# Patient Record
Sex: Female | Born: 1950 | Race: Black or African American | Hispanic: No | Marital: Single | Smoking: Former smoker
Health system: Southern US, Community
[De-identification: ages and names within clinical notes are randomized; demographics above are authoritative.]

## PROBLEM LIST (undated history)

## (undated) DIAGNOSIS — E669 Obesity, unspecified: Secondary | ICD-10-CM

## (undated) DIAGNOSIS — I2510 Atherosclerotic heart disease of native coronary artery without angina pectoris: Secondary | ICD-10-CM

## (undated) DIAGNOSIS — R9431 Abnormal electrocardiogram [ECG] [EKG]: Secondary | ICD-10-CM

## (undated) DIAGNOSIS — R233 Spontaneous ecchymoses: Secondary | ICD-10-CM

## (undated) DIAGNOSIS — D689 Coagulation defect, unspecified: Secondary | ICD-10-CM

## (undated) DIAGNOSIS — F32A Depression, unspecified: Secondary | ICD-10-CM

## (undated) DIAGNOSIS — M1990 Unspecified osteoarthritis, unspecified site: Secondary | ICD-10-CM

## (undated) DIAGNOSIS — Z8669 Personal history of other diseases of the nervous system and sense organs: Secondary | ICD-10-CM

## (undated) DIAGNOSIS — M6281 Muscle weakness (generalized): Secondary | ICD-10-CM

## (undated) DIAGNOSIS — F809 Developmental disorder of speech and language, unspecified: Secondary | ICD-10-CM

## (undated) DIAGNOSIS — R0602 Shortness of breath: Secondary | ICD-10-CM

## (undated) DIAGNOSIS — F419 Anxiety disorder, unspecified: Secondary | ICD-10-CM

## (undated) DIAGNOSIS — R238 Other skin changes: Secondary | ICD-10-CM

## (undated) DIAGNOSIS — R002 Palpitations: Secondary | ICD-10-CM

## (undated) DIAGNOSIS — H269 Unspecified cataract: Secondary | ICD-10-CM

## (undated) DIAGNOSIS — Z973 Presence of spectacles and contact lenses: Secondary | ICD-10-CM

## (undated) DIAGNOSIS — F329 Major depressive disorder, single episode, unspecified: Secondary | ICD-10-CM

## (undated) DIAGNOSIS — I209 Angina pectoris, unspecified: Secondary | ICD-10-CM

## (undated) DIAGNOSIS — C801 Malignant (primary) neoplasm, unspecified: Secondary | ICD-10-CM

## (undated) DIAGNOSIS — H9190 Unspecified hearing loss, unspecified ear: Secondary | ICD-10-CM

## (undated) DIAGNOSIS — I739 Peripheral vascular disease, unspecified: Secondary | ICD-10-CM

## (undated) DIAGNOSIS — Z9289 Personal history of other medical treatment: Secondary | ICD-10-CM

## (undated) DIAGNOSIS — K5790 Diverticulosis of intestine, part unspecified, without perforation or abscess without bleeding: Secondary | ICD-10-CM

## (undated) DIAGNOSIS — D649 Anemia, unspecified: Secondary | ICD-10-CM

## (undated) DIAGNOSIS — I639 Cerebral infarction, unspecified: Secondary | ICD-10-CM

## (undated) DIAGNOSIS — I10 Essential (primary) hypertension: Secondary | ICD-10-CM

## (undated) DIAGNOSIS — I213 ST elevation (STEMI) myocardial infarction of unspecified site: Secondary | ICD-10-CM

## (undated) DIAGNOSIS — K648 Other hemorrhoids: Secondary | ICD-10-CM

## (undated) DIAGNOSIS — K626 Ulcer of anus and rectum: Secondary | ICD-10-CM

## (undated) DIAGNOSIS — I4510 Unspecified right bundle-branch block: Secondary | ICD-10-CM

## (undated) DIAGNOSIS — N189 Chronic kidney disease, unspecified: Secondary | ICD-10-CM

## (undated) HISTORY — PX: HX CYSTOSCOPY: 2100001404

## (undated) HISTORY — PX: HX LAPAROTOMY: SHX154

## (undated) HISTORY — DX: Anemia, unspecified: D64.9

## (undated) HISTORY — DX: Major depressive disorder, single episode, unspecified: F32.9

## (undated) HISTORY — DX: Cerebral infarction, unspecified: I63.9

## (undated) HISTORY — DX: Personal history of other medical treatment: Z92.89

## (undated) HISTORY — DX: Peripheral vascular disease, unspecified: I73.9

---
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
## 2005-07-27 DIAGNOSIS — C801 Malignant (primary) neoplasm, unspecified: Secondary | ICD-10-CM

## 2005-07-27 HISTORY — DX: Malignant (primary) neoplasm, unspecified (CMS HCC): C80.1

## 2011-06-27 DIAGNOSIS — I213 ST elevation (STEMI) myocardial infarction of unspecified site: Secondary | ICD-10-CM

## 2011-06-27 HISTORY — DX: ST elevation (STEMI) myocardial infarction of unspecified site: I21.3

## 2011-07-03 ENCOUNTER — Ambulatory Visit (HOSPITAL_COMMUNITY): Admit: 2011-07-03 | Payer: Self-pay | Admitting: Interventional Cardiology

## 2011-07-03 ENCOUNTER — Other Ambulatory Visit: Payer: Self-pay

## 2011-07-03 ENCOUNTER — Encounter (HOSPITAL_COMMUNITY)
Admission: EM | Disposition: A | Discharge status: Home / Self Care | Payer: Self-pay | Source: Home / Self Care | Attending: Internal Medicine

## 2011-07-03 ENCOUNTER — Inpatient Hospital Stay (HOSPITAL_BASED_OUTPATIENT_CLINIC_OR_DEPARTMENT_OTHER)
Admission: EM | Admit: 2011-07-03 | Discharge: 2011-07-06 | DRG: 392 | Disposition: A | Discharge status: Home / Self Care | Payer: Medicare Other | Attending: Internal Medicine | Admitting: Internal Medicine

## 2011-07-03 ENCOUNTER — Other Ambulatory Visit (HOSPITAL_BASED_OUTPATIENT_CLINIC_OR_DEPARTMENT_OTHER): Payer: Medicare Other

## 2011-07-03 ENCOUNTER — Inpatient Hospital Stay (HOSPITAL_COMMUNITY): Payer: Medicare Other

## 2011-07-03 DIAGNOSIS — Z8541 Personal history of malignant neoplasm of cervix uteri: Secondary | ICD-10-CM

## 2011-07-03 DIAGNOSIS — Z87891 Personal history of nicotine dependence: Secondary | ICD-10-CM

## 2011-07-03 DIAGNOSIS — D72829 Elevated white blood cell count, unspecified: Secondary | ICD-10-CM | POA: Diagnosis present

## 2011-07-03 DIAGNOSIS — C539 Malignant neoplasm of cervix uteri, unspecified: Secondary | ICD-10-CM | POA: Diagnosis present

## 2011-07-03 DIAGNOSIS — E876 Hypokalemia: Secondary | ICD-10-CM | POA: Diagnosis present

## 2011-07-03 DIAGNOSIS — K529 Noninfective gastroenteritis and colitis, unspecified: Secondary | ICD-10-CM | POA: Diagnosis present

## 2011-07-03 DIAGNOSIS — N179 Acute kidney failure, unspecified: Secondary | ICD-10-CM | POA: Diagnosis present

## 2011-07-03 DIAGNOSIS — D649 Anemia, unspecified: Secondary | ICD-10-CM | POA: Diagnosis present

## 2011-07-03 DIAGNOSIS — I213 ST elevation (STEMI) myocardial infarction of unspecified site: Secondary | ICD-10-CM

## 2011-07-03 DIAGNOSIS — A084 Viral intestinal infection, unspecified: Secondary | ICD-10-CM | POA: Diagnosis present

## 2011-07-03 DIAGNOSIS — E860 Dehydration: Secondary | ICD-10-CM | POA: Diagnosis present

## 2011-07-03 DIAGNOSIS — H9190 Unspecified hearing loss, unspecified ear: Secondary | ICD-10-CM | POA: Diagnosis present

## 2011-07-03 DIAGNOSIS — A088 Other specified intestinal infections: Principal | ICD-10-CM | POA: Diagnosis present

## 2011-07-03 DIAGNOSIS — R748 Abnormal levels of other serum enzymes: Secondary | ICD-10-CM | POA: Diagnosis present

## 2011-07-03 DIAGNOSIS — E669 Obesity, unspecified: Secondary | ICD-10-CM | POA: Diagnosis present

## 2011-07-03 DIAGNOSIS — I4510 Unspecified right bundle-branch block: Secondary | ICD-10-CM | POA: Diagnosis present

## 2011-07-03 HISTORY — PX: HX HEART CATHETERIZATION: SHX148

## 2011-07-03 HISTORY — PX: LEFT HEART CATHETERIZATION WITH CORONARY ANGIOGRAM: SHX5451

## 2011-07-03 HISTORY — DX: ST elevation (STEMI) myocardial infarction of unspecified site: I21.3

## 2011-07-03 HISTORY — PX: CARDIAC CATHETERIZATION: SHX172

## 2011-07-03 HISTORY — DX: Malignant (primary) neoplasm, unspecified: C80.1

## 2011-07-03 LAB — COMPREHENSIVE METABOLIC PANEL
AST: 44 U/L — ABNORMAL HIGH (ref 0–37)
Albumin: 4.2 g/dL (ref 3.5–5.2)
Alkaline Phosphatase: 93 U/L (ref 39–117)
BUN: 32 mg/dL — ABNORMAL HIGH (ref 6–23)
CO2: 20 mEq/L (ref 19–32)
Chloride: 97 mEq/L (ref 96–112)
GFR calc non Af Amer: 23 mL/min — ABNORMAL LOW (ref >90)
Potassium: 4 mEq/L (ref 3.5–5.1)
Total Bilirubin: 0.7 mg/dL (ref 0.3–1.2)

## 2011-07-03 LAB — CBC
HCT: 50.7 % — ABNORMAL HIGH (ref 36.0–46.0)
Hemoglobin: 18.3 g/dL — ABNORMAL HIGH (ref 12.0–15.0)
MCH: 32.7 pg (ref 26.0–34.0)
MCHC: 36.1 g/dL — ABNORMAL HIGH (ref 30.0–36.0)
MCV: 90.7 fL (ref 78.0–100.0)
RDW: 12.8 % (ref 11.5–15.5)

## 2011-07-03 LAB — PROTIME-INR
INR: 1.15 (ref 0.00–1.49)
Prothrombin Time: 14.9 seconds (ref 11.6–15.2)

## 2011-07-03 LAB — DIFFERENTIAL
Basophils Relative: 0 % (ref 0–1)
Eosinophils Absolute: 0 10*3/uL (ref 0.0–0.7)
Lymphocytes Relative: 18 % (ref 12–46)
Lymphs Abs: 3.6 10*3/uL (ref 0.7–4.0)
Neutro Abs: 14.2 10*3/uL — ABNORMAL HIGH (ref 1.7–7.7)

## 2011-07-03 LAB — APTT: aPTT: 42 seconds — ABNORMAL HIGH (ref 24–37)

## 2011-07-03 LAB — CARDIAC PANEL(CRET KIN+CKTOT+MB+TROPI)
CK, MB: 7.1 ng/mL (ref 0.3–4.0)
Relative Index: 1 (ref 0.0–2.5)
Total CK: 733 U/L — ABNORMAL HIGH (ref 7–177)

## 2011-07-03 LAB — CK TOTAL AND CKMB (NOT AT ARMC): CK, MB: 8.8 ng/mL (ref 0.3–4.0)

## 2011-07-03 SURGERY — LEFT HEART CATHETERIZATION WITH CORONARY ANGIOGRAM
Anesthesia: LOCAL | Laterality: Bilateral

## 2011-07-03 MED ORDER — ASPIRIN 300 MG RE SUPP
300.0000 mg | RECTAL | Status: AC
  Filled 2011-07-03: qty 1

## 2011-07-03 MED ORDER — ACETAMINOPHEN 325 MG PO TABS: 650.0000 mg | ORAL_TABLET | ORAL | Status: DC | PRN

## 2011-07-03 MED ORDER — LIDOCAINE HCL (PF) 1 % IJ SOLN
INTRAMUSCULAR | Status: AC
  Filled 2011-07-03: qty 30

## 2011-07-03 MED ORDER — ONDANSETRON HCL 4 MG/2ML IJ SOLN
4.0000 mg | Freq: Four times a day (QID) | INTRAMUSCULAR | Status: DC | PRN
  Administered 2011-07-03: 4 mg via INTRAVENOUS
  Filled 2011-07-03: qty 2

## 2011-07-03 MED ORDER — NITROGLYCERIN 0.2 MG/ML ON CALL CATH LAB
INTRAVENOUS | Status: AC
  Filled 2011-07-03: qty 1

## 2011-07-03 MED ORDER — SODIUM CHLORIDE 0.9 % IV SOLN
INTRAVENOUS | Status: DC
  Administered 2011-07-03: via INTRAVENOUS
  Administered 2011-07-03: 125 mL/h via INTRAVENOUS
  Administered 2011-07-04 – 2011-07-06 (×2): via INTRAVENOUS

## 2011-07-03 MED ORDER — ASPIRIN 81 MG PO CHEW: 324.0000 mg | CHEWABLE_TABLET | ORAL | Status: AC

## 2011-07-03 MED ORDER — FENTANYL CITRATE 0.05 MG/ML IJ SOLN
INTRAMUSCULAR | Status: AC
  Filled 2011-07-03: qty 2

## 2011-07-03 MED ORDER — ASPIRIN 81 MG PO CHEW
324.0000 mg | CHEWABLE_TABLET | Freq: Once | ORAL | Status: AC
  Administered 2011-07-03: 324 mg via ORAL

## 2011-07-03 MED ORDER — HEPARIN (PORCINE) IN NACL 2-0.9 UNIT/ML-% IJ SOLN
INTRAMUSCULAR | Status: AC
  Filled 2011-07-03: qty 2000

## 2011-07-03 MED ORDER — MORPHINE SULFATE 2 MG/ML IJ SOLN: 1.0000 mg | INTRAMUSCULAR | Status: DC | PRN

## 2011-07-03 MED ORDER — ONDANSETRON HCL 4 MG/2ML IJ SOLN: 4.0000 mg | Freq: Four times a day (QID) | INTRAMUSCULAR | Status: DC | PRN

## 2011-07-03 MED ORDER — ALPRAZOLAM 0.25 MG PO TABS: 0.2500 mg | ORAL_TABLET | Freq: Two times a day (BID) | ORAL | Status: DC | PRN

## 2011-07-03 MED ORDER — NITROGLYCERIN 0.4 MG SL SUBL: 0.4000 mg | SUBLINGUAL_TABLET | SUBLINGUAL | Status: DC | PRN

## 2011-07-03 MED ORDER — MIDAZOLAM HCL 2 MG/2ML IJ SOLN
INTRAMUSCULAR | Status: AC
  Filled 2011-07-03: qty 2

## 2011-07-03 NOTE — Progress Notes (Signed)
Dr. Lurline Idol notified of Patient's troponin 4.5 and CKMB 7.1 at this time. No new orders received. Patient is chest pain free.  Blair Heys Leanne 10:50 PM 07/03/2011

## 2011-07-03 NOTE — Progress Notes (Signed)
  Echocardiogram 2D Echocardiogram has been performed.  Dorena Cookey 07/03/2011, 10:17 PM

## 2011-07-03 NOTE — ED Notes (Signed)
Pt has a 3 day history of chest pain, shortness of breath and diaphoresis.

## 2011-07-03 NOTE — H&P (Addendum)
Admit date: 07/03/2011 Referring Physician Lorre Nick Primary Cardiologist [facility] Chief complaint/reason for admission: Chest pain, concern for inferior ST elevation MI  HPI: 60 -year-old woman who has a history of cervical cancer.  Due to chemotherapy, she has severe hearing loss.  She has been having intermittent chest pain over the past 3 days.  She has been vomiting severely.  She has not been able to keep any food down.  She came to [facility] and had an ECG showing a right bundle branch block with ST elevation in the inferior and lateral leads.  It was reported that there was ST depression in V1 and V2.  She is transferred for emergent cardiac catheterization [facility].  Upon arrival to the cath lab, she is pain free.  She has left hip pain.    PMH:  Cervical cancer PSH:  None   ALLERGIES:   Review of patient's allergies indicates no known allergies.  Prior to Admit Meds:   No prescriptions prior to admission   Family HX:   No early heart disease Social HX:    History   Social History  . Marital Status: Unknown    Spouse Name: N/A    Number of Children: N/A  . Years of Education: N/A   Occupational History  . Not on file.   Social History Main Topics  . Smoking status: Former Games developer  . Smokeless tobacco: Not on file  . Alcohol Use:   . Drug Use:   . Sexually Active:    Other Topics Concern  . Not on file   Social History Narrative  . No narrative on file     ROS:  All 11 ROS were addressed and are negative except what is stated in the HPI  PHYSICAL EXAM Filed Vitals:   07/03/11 1823  BP: 112/70  Pulse: 97  Temp:   Resp: 18   General: Well developed, well nourished, in no acute distress Head: Eyes PERRLA, No xanthomas.   Normal cephalic and atramatic  Lungs:   Clear bilaterally to auscultation and percussion. Heart:   Tachycardic S1 S2 Pulses are 2+ & equal.             Abdomen: Bowel sounds are positive, abdomen soft and non-tender  without masses or                  Hernia's noted.  Extremities:   2+ right femoral pulse Neuro: Alert and oriented X 3. Psych:  Good affect, responds appropriately   Labs:   Lab Results  Component Value Date   WBC 20.0* 07/03/2011   HGB 18.3* 07/03/2011   HCT 50.7* 07/03/2011   MCV 90.7 07/03/2011   PLT 278 07/03/2011    Lab 07/03/11 1822  NA 137  K 4.0  CL 97  CO2 20  BUN 32*  CREATININE 2.20*  CALCIUM 10.4  PROT 9.1*  BILITOT 0.7  ALKPHOS 93  ALT 22  AST 44*  GLUCOSE 126*   Lab Results  Component Value Date   CKTOTAL 914* 07/03/2011   CKMB 8.8* 07/03/2011   TROPONINI 6.90* 07/03/2011   No results found for this basename: PTT   No results found for this basename: INR, PROTIME     No results found for this basename: CHOL   No results found for this basename: HDL   No results found for this basename: LDLCALC   No results found for this basename: TRIG   No results found for this basename:  CHOLHDL   No results found for this basename: LDLDIRECT      Radiology:  @[id number] @  EKG:  Sinus tachycardia, right bundle branch block, ST elevation in the inferior leads with Q waves  ASSESSMENT: Possible acute MI.  PLAN:  She'll be taken emergently to the cath lab.  Further plans will be based on her catheterization results. We'll have to minimize contrast exposure due to her increased creatinine.    She will also need an infectious disease workup due to the elevated white blood cell count.  Traci Ruud S.  07/03/2011  7:47 PM   Addendum Repeat physical exam last night after cath revealed some mild lower abdominal tenderness to palpation.  Consider abdominal CT scan when creatinine improves.  Echo shows hyperdynamic LV function.  Unclear why troponin is increased.  This is not ACS, and is not Takatsubo. Elevated Troponin may be related to renal failure.  Watch fluid status given that she has some LVH to avoid exacerbating diastolic heart  failure.  Traci Schurman S. 07/04/2011

## 2011-07-03 NOTE — Op Note (Signed)
PROCEDURE:  Left heart catheterization with selective coronary angiography  INDICATIONS:    The patient was brought emergently to the cath lab to due to concern for an inferior ST elevation MI.  The patient verbalized understanding and wanted to proceed.  Informed written consent was obtained.  PROCEDURE TECHNIQUE:  After Xylocaine anesthesia a 48F sheath was placed in the right femoral vein.  A 6 French sheath was placed in the  right femoral artery with a single anterior needle wall stick.   Left coronary angiography was done using a Judkins L4 guide catheter.  Right coronary angiography was done using a Judkins R4 guide catheter.  Left heart catheterization was done using a pigtail catheter.    CONTRAST:  Total of 20 cc.  COMPLICATIONS:  None.    HEMODYNAMICS:  Aortic pressure was 125/77, mean aortic pressure 96; LV pressure was 129/5; LVEDP 8.  There was no gradient between the left ventricle and aorta.    ANGIOGRAPHIC DATA:   The left main coronary artery is a short vessel and angiographically normal..  The left anterior descending artery is a large vessel which reaches the apex and is angiographically normal.  There are 2 medium-sized diagonals which are widely patent.  The left circumflex artery is a large vessel across the lateral wall which is angiographically normal.  There is a large OM1 which is angiographically normal..  The right coronary artery is a large dominant vessel which is angiographically normal.  IMPRESSIONS:  1. Normal left main coronary artery. 2. Normal left anterior descending artery and its branches. 3. Normal left circumflex artery and its branches. 4. Normal right coronary artery. 5. LVEDP 8 mmHg.  Ejection fraction was not assessed due to the patient's increased creatinine.   RECOMMENDATION:  The patient will be admitted for IV hydration.  She has an elevated white blood cell count.  She will need blood cultures.  This is not an acute coronary syndrome.   She may have some type of flulike illness which caused her vomiting.

## 2011-07-03 NOTE — ED Notes (Signed)
Pt transported to ED room via charge nurse.

## 2011-07-03 NOTE — ED Provider Notes (Signed)
History     CSN: [id number] Arrival date & time: 07/03/2011  6:11 PM   First MD Initiated Contact with Patient 07/03/11 1814      Chief Complaint  Patient presents with  . Chest Pain    (Consider location/radiation/quality/duration/timing/severity/associated sxs/prior treatment) Patient is a 60 y.o. female presenting with chest pain. The history is provided by the patient and a relative. The history is limited by the condition of the patient.  Chest Pain    patient here with acute onset of chest pain and pressure which started 3 days ago which became worse again. Symptoms described as being intermittent in nature and localized to her mid sternal area. She is currently chest pain free but did have chest pain prior to arrival and this was associated with diaphoresis and shortness of breath.  denies any fever or cough. Patient did not have any aspirin prior to arrival. She denies any prior cardiac history. Nothing makes her symptoms better or worse pain is described as pressure in nature. History reviewed. No pertinent past medical history.  No past surgical history on file.  No family history on file.  History  Substance Use Topics  . Smoking status: Former Games developer  . Smokeless tobacco: Not on file  . Alcohol Use:     OB History    Grav Para Term Preterm Abortions TAB SAB Ect Mult Living                  Review of Systems  Cardiovascular: Positive for chest pain.  All other systems reviewed and are negative.    Allergies  Review of patient's allergies indicates no known allergies.  Home Medications  No current outpatient prescriptions on file.  BP 112/70  Pulse 97  Temp(Src) 97.7 F (36.5 C) (Oral)  Resp 18  SpO2 98%  Physical Exam  Nursing note and vitals reviewed. Constitutional: She is oriented to person, place, and time. She appears well-developed and well-nourished.  Non-toxic appearance. No distress.  HENT:  Head: Normocephalic and atraumatic.  Eyes:  Conjunctivae, EOM and lids are normal. Pupils are equal, round, and reactive to light.  Neck: Normal range of motion. Neck supple. No tracheal deviation present. No mass present.  Cardiovascular: Regular rhythm and normal heart sounds.  Tachycardia present.  Exam reveals no gallop.   No murmur heard. Pulmonary/Chest: Effort normal and breath sounds normal. No stridor. No respiratory distress. She has no decreased breath sounds. She has no wheezes. She has no rhonchi. She has no rales.  Abdominal: Soft. Normal appearance and bowel sounds are normal. She exhibits no distension. There is no tenderness. There is no rebound and no CVA tenderness.  Musculoskeletal: Normal range of motion. She exhibits no edema and no tenderness.  Neurological: She is alert and oriented to person, place, and time. She has normal strength. No cranial nerve deficit or sensory deficit. GCS eye subscore is 4. GCS verbal subscore is 5. GCS motor subscore is 6.  Skin: Skin is warm. No abrasion and no rash noted. She is diaphoretic.  Psychiatric: Her speech is normal and behavior is normal. Her mood appears anxious.    ED Course  Procedures (including critical care time)   Labs Reviewed  CBC  DIFFERENTIAL  TROPONIN I  CK TOTAL AND CKMB  COMPREHENSIVE METABOLIC PANEL   No results found.   No diagnosis found.    MDM   Date: 07/03/2011  Rate: 120  Rhythm: sinus tachycardia  QRS Axis: normal  Intervals: normal  ST/T Wave abnormalities: ST elevations inferiorly, LATERALLY  Conduction Disutrbances:right bundle branch block  Narrative Interpretation: inferior lateral STEMI  Old EKG Reviewed: none available  Patient given aspirin on arrival. Code STEMI activated. Patient currently chest pain/pressure free. Spoke with cardiologist at [facility] and patient to be transported directly to cardiac catheterization lab. Patient had a repeat EKG 10 minutes later continued to show ST elevations inferior and  laterally        Toy Baker, MD 07/03/11 1836

## 2011-07-04 LAB — BASIC METABOLIC PANEL
BUN: 27 mg/dL — ABNORMAL HIGH (ref 6–23)
CO2: 21 mEq/L (ref 19–32)
Calcium: 8.1 mg/dL — ABNORMAL LOW (ref 8.4–10.5)
Chloride: 99 mEq/L (ref 96–112)
Creatinine, Ser: 1.37 mg/dL — ABNORMAL HIGH (ref 0.50–1.10)
GFR calc Af Amer: 48 mL/min — ABNORMAL LOW (ref >90)
GFR calc non Af Amer: 41 mL/min — ABNORMAL LOW (ref >90)
Glucose, Bld: 114 mg/dL — ABNORMAL HIGH (ref 70–99)
Sodium: 133 mEq/L — ABNORMAL LOW (ref 135–145)

## 2011-07-04 LAB — URINALYSIS, ROUTINE W REFLEX MICROSCOPIC
Glucose, UA: NEGATIVE mg/dL
Ketones, ur: NEGATIVE mg/dL
Nitrite: NEGATIVE
pH: 5 (ref 5.0–8.0)

## 2011-07-04 LAB — CARDIAC PANEL(CRET KIN+CKTOT+MB+TROPI)
CK, MB: 7.4 ng/mL (ref 0.3–4.0)
Relative Index: 0.9 (ref 0.0–2.5)
Total CK: 766 U/L — ABNORMAL HIGH (ref 7–177)

## 2011-07-04 LAB — LIPID PANEL
HDL: 48 mg/dL (ref >39)
LDL Cholesterol: 154 mg/dL — ABNORMAL HIGH (ref 0–99)
Total CHOL/HDL Ratio: 4.8 RATIO
Triglycerides: 129 mg/dL (ref <150)
VLDL: 26 mg/dL (ref 0–40)

## 2011-07-04 LAB — HEMOGLOBIN A1C: Mean Plasma Glucose: 120 mg/dL — ABNORMAL HIGH (ref <117)

## 2011-07-04 LAB — CBC
Hemoglobin: 14.6 g/dL (ref 12.0–15.0)
MCH: 33.1 pg (ref 26.0–34.0)
MCV: 94.8 fL (ref 78.0–100.0)
Platelets: 204 10*3/uL (ref 150–400)
RBC: 4.41 MIL/uL (ref 3.87–5.11)
WBC: 16.3 10*3/uL — ABNORMAL HIGH (ref 4.0–10.5)

## 2011-07-04 LAB — URINE MICROSCOPIC-ADD ON

## 2011-07-04 LAB — TSH: TSH: 1.951 u[IU]/mL (ref 0.350–4.500)

## 2011-07-04 MED ORDER — MAGNESIUM HYDROXIDE 400 MG/5ML PO SUSP
30.0000 mL | Freq: Every day | ORAL | Status: DC | PRN
  Administered 2011-07-04: 30 mL via ORAL
  Filled 2011-07-04: qty 30

## 2011-07-04 MED ORDER — POTASSIUM CHLORIDE CRYS ER 20 MEQ PO TBCR
20.0000 meq | EXTENDED_RELEASE_TABLET | Freq: Once | ORAL | Status: AC
  Administered 2011-07-04: 20 meq via ORAL
  Filled 2011-07-04: qty 1

## 2011-07-04 NOTE — Progress Notes (Addendum)
Paged md through answering service at 1715 for result of potassium at 1600.no call back. Paged md again directly at 74.md returned call for potassium.md en route,so verbal order taken. Tammy Sours

## 2011-07-04 NOTE — Plan of Care (Signed)
Problem: Phase II Progression Outcomes Goal: CV Risk Factors identified Outcome: Not Applicable Date Met:  07/04/11 Not cardiac/viral related

## 2011-07-04 NOTE — Progress Notes (Signed)
SUBJECTIVE:  Event of last pm noted.  Cardiac cath showed normal coronary arteries with nl LVF.    OBJECTIVE:   Vitals:   Filed Vitals:   07/04/11 0500 07/04/11 0600 07/04/11 0700 07/04/11 0800  BP: 130/61 112/70 114/75 116/65  Pulse: 103 93 106 99  Temp:    99 F (37.2 C)  TempSrc:    Oral  Resp:      Height:      Weight:      SpO2: 95% 94% 92% 94%   I&O's:   Intake/Output Summary (Last 24 hours) at 07/04/11 1191 Last data filed at 07/04/11 0600  Gross per 24 hour  Intake   1375 ml  Output    200 ml  Net   1175 ml   TELEMETRY: Reviewed telemetry pt in NSR     PHYSICAL EXAM General: Well developed, well nourished, in no acute distress Head: Eyes PERRLA, No xanthomas.   Normal cephalic and atramatic  Lungs:   Clear bilaterally to auscultation and percussion. Heart:   HRRR S1 S2 Pulses are 2+ & equal.            No carotid bruit. No JVD.  No abdominal bruits. No femoral bruits. Abdomen: Bowel sounds are positive, abdomen soft and non-tender without masses Extremities:   No clubbing, cyanosis or edema.  DP +1 Neuro: Alert and oriented X 3. Psych:  Good affect, responds appropriately   LABS: Basic Metabolic Panel:  Basename 07/04/11 0500 07/03/11 1822  NA 133* 137  K 3.3* 4.0  CL 99 97  CO2 21 20  GLUCOSE 114* 126*  BUN 33* 32*  CREATININE 1.73* 2.20*  CALCIUM 8.1* 10.4  MG -- --  PHOS -- --   Liver Function Tests:  [facility] 07/03/11 1822  AST 44*  ALT 22  ALKPHOS 93  BILITOT 0.7  PROT 9.1*  ALBUMIN 4.2    CBC:  Basename 07/04/11 0500 07/03/11 1822  WBC 16.3* 20.0*  NEUTROABS -- 14.2*  HGB 14.6 18.3*  HCT 41.8 50.7*  MCV 94.8 90.7  PLT 204 278   Cardiac Enzymes:  Basename 07/04/11 0500 07/04/11 0006 07/03/11 2124  CKTOTAL 683* 766* 733*  CKMB 6.3* 7.4* 7.1*  CKMBINDEX -- -- --  TROPONINI 3.93* 5.01* 4.50*   Hemoglobin A1C:  Basename 07/03/11 2124  HGBA1C 5.8*   Fasting Lipid Panel:  Basename 07/04/11 0500  CHOL 228*  HDL 48    LDLCALC 154*  TRIG 129  CHOLHDL 4.8  LDLDIRECT --   Thyroid Function Tests:  Basename 07/03/11 2124  TSH 1.951  T4TOTAL --  T3FREE --  THYROIDAB --    Coag Panel:   Lab Results  Component Value Date   INR 1.15 07/03/2011    RADIOLOGY: Dg Chest Port 1 View  07/04/2011  *RADIOLOGY REPORT*  Clinical Data: Infarction.  Elevated white blood cell count.  PORTABLE CHEST - 1 VIEW  Comparison: None.  Findings: Elevation of the left hemidiaphragm.  Cardiopericardial silhouette appears within normal limits for volumes of inspiration. Mediastinal contours normal.  Basilar atelectasis.  No pneumothorax.  No edema.  IMPRESSION: Elevation of the left hemidiaphragm and low lung volumes.  No acute cardiopulmonary disease.  Basilar atelectasis.  Original Report Authenticated By: Andreas Newport, M.D.      ASSESSMENT:  1.  Normal Coronary arteries by cath with normal LVF 2.  Increased CPK out of proportion to CPKMB bump and most likely related to underlying viral syndrome. 3.  Elevated Troponin in setting of  acute renal failure from dehydration. 4.  Hypokalemia 5.  Nausea and severe vomiting with dehydration 6.  Chest pain most likely due to #5 7. Acute renal failure secondary to dehydration improved on IVF 8.  Leukocytosis improved probably due to underlying viral syndrome  PLAN:   1.   transfer to Hospitalist service for further treatment of probable viral syndrome. 2.  Replete Potassium 3.  Continue IVF hydration  Quintella Reichert, MD  07/04/2011  9:17 AM

## 2011-07-05 LAB — BASIC METABOLIC PANEL
BUN: 18 mg/dL (ref 6–23)
Calcium: 7.8 mg/dL — ABNORMAL LOW (ref 8.4–10.5)
Creatinine, Ser: 1.18 mg/dL — ABNORMAL HIGH (ref 0.50–1.10)
GFR calc Af Amer: 57 mL/min — ABNORMAL LOW (ref >90)

## 2011-07-05 LAB — DRUGS OF ABUSE SCREEN W/O ALC, ROUTINE URINE
Amphetamine Screen, Ur: NEGATIVE
Barbiturate Quant, Ur: NEGATIVE
Cocaine Metabolites: NEGATIVE
Creatinine,U: 83.3 mg/dL
Propoxyphene: NEGATIVE

## 2011-07-05 LAB — URINE CULTURE: Culture  Setup Time: 201212080036

## 2011-07-05 LAB — CBC
MCHC: 33.5 g/dL (ref 30.0–36.0)
MCV: 97.2 fL (ref 78.0–100.0)
Platelets: 190 10*3/uL (ref 150–400)
RDW: 13.4 % (ref 11.5–15.5)
WBC: 10.7 10*3/uL — ABNORMAL HIGH (ref 4.0–10.5)

## 2011-07-05 NOTE — Plan of Care (Signed)
Problem: Phase II Progression Outcomes Goal: Other Phase II Outcomes/Goals Outcome: Completed/Met Date Met:  07/05/11 Gait steady . Pt denies dizziness,SOB,chest pain,pressure or tightness.

## 2011-07-05 NOTE — Progress Notes (Signed)
Subjective: Patient seen and examined this morning. denies any chest pain or abdominal pain . She is hard of hearing. She is worried that this symptoms may have occurred due to her poor dentition and she has been having bleeding gums while brushing for past few months. Denies substance abuse.  Objective:  Vital signs in last 24 hours:  Filed Vitals:   07/04/11 2000 07/05/11 0000 07/05/11 0400 07/05/11 0728  BP: 142/75 111/68 108/60 110/58  Pulse:    76  Temp:  98.5 F (36.9 C) 98.5 F (36.9 C) 98.9 F (37.2 C)  TempSrc:  Oral Oral Oral  Resp:    18  Height:      Weight:      SpO2:  96% 97% 96%    Intake/Output from previous day:   Intake/Output Summary (Last 24 hours) at 07/05/11 0958 Last data filed at 07/05/11 0700  Gross per 24 hour  Intake   3705 ml  Output   1650 ml  Net   2055 ml    Physical Exam:  General: elderly obese female  in no acute distress. Hard of hearing HEENT: no pallor, no icterus, moist oral mucosa, no JVD, no lymphadenopathy, poor dentition with bad odour Heart: Normal  s1 &s2  Regular rate and rhythm, without murmurs, rubs, gallops. Lungs: Clear to auscultation bilaterally. Abdomen: Soft, nontender, nondistended, positive bowel sounds. Extremities: No clubbing cyanosis or edema with positive pedal pulses. Neuro: Alert, awake, oriented x3, nonfocal.   Lab Results:  Basic Metabolic Panel:    Component Value Date/Time   NA 138 07/05/2011 0525   K 3.6 07/05/2011 0525   CL 109 07/05/2011 0525   CO2 23 07/05/2011 0525   BUN 18 07/05/2011 0525   CREATININE 1.18* 07/05/2011 0525   GLUCOSE 99 07/05/2011 0525   CALCIUM 7.8* 07/05/2011 0525   CBC:    Component Value Date/Time   WBC 10.7* 07/05/2011 0525   HGB 11.5* 07/05/2011 0525   HCT 34.3* 07/05/2011 0525   PLT 190 07/05/2011 0525   MCV 97.2 07/05/2011 0525   NEUTROABS 14.2* 07/03/2011 1822   LYMPHSABS 3.6 07/03/2011 1822   MONOABS 2.2* 07/03/2011 1822   EOSABS 0.0 07/03/2011 1822   BASOSABS 0.0  07/03/2011 1822    Recent Results (from the past 240 hour(s))  MRSA PCR SCREENING     Status: Normal   Collection Time   07/03/11  8:16 PM      Component Value Range Status Comment   MRSA by PCR NEGATIVE  NEGATIVE  Final   CULTURE, BLOOD (ROUTINE X 2)     Status: Normal (Preliminary result)   Collection Time   07/03/11  9:24 PM      Component Value Range Status Comment   Specimen Description BLOOD LEFT HAND   Final    Special Requests BOTTLES DRAWN AEROBIC ONLY 10CC   Final    Setup Time [id number]   Final    Culture     Final    Value:        BLOOD CULTURE RECEIVED NO GROWTH TO DATE CULTURE WILL BE HELD FOR 5 DAYS BEFORE ISSUING A FINAL NEGATIVE REPORT   Report Status PENDING   Incomplete   CULTURE, BLOOD (SINGLE)     Status: Normal (Preliminary result)   Collection Time   07/04/11 10:53 AM      Component Value Range Status Comment   Specimen Description BLOOD HAND LEFT   Final    Special Requests BOTTLES DRAWN AEROBIC ONLY  8.0 CC   Final    Setup Time [id number]   Final    Culture     Final    Value:        BLOOD CULTURE RECEIVED NO GROWTH TO DATE CULTURE WILL BE HELD FOR 5 DAYS BEFORE ISSUING A FINAL NEGATIVE REPORT   Report Status PENDING   Incomplete     Studies/Results: Dg Chest Port 1 View  07/04/2011  *RADIOLOGY REPORT*  Clinical Data: Infarction.  Elevated white blood cell count.  PORTABLE CHEST - 1 VIEW  Comparison: None.  Findings: Elevation of the left hemidiaphragm.  Cardiopericardial silhouette appears within normal limits for volumes of inspiration. Mediastinal contours normal.  Basilar atelectasis.  No pneumothorax.  No edema.  IMPRESSION: Elevation of the left hemidiaphragm and low lung volumes.  No acute cardiopulmonary disease.  Basilar atelectasis.  Original Report Authenticated By: Andreas Newport, M.D.    Medications: Scheduled Meds:   . aspirin  324 mg Oral NOW   Or  . aspirin  300 mg Rectal NOW  . potassium chloride  20 mEq Oral Once  . potassium  chloride  20 mEq Oral Once   Continuous Infusions:   . sodium chloride 125 mL/hr at 07/04/11 0732   PRN Meds:.acetaminophen, ALPRAZolam, magnesium hydroxide, morphine, nitroGLYCERIN, ondansetron (ZOFRAN) IV, ondansetron (ZOFRAN) IV  Assessment:  60 y/o AA obese female with hx of cervical ca s/p chemo admitted with substernal chest discomfort and abdominal pain with ST changes in inferior leads and significant troponemia ( 5.0) . She was taken for cardiac cath which surprisingly showed clean coronaries and normal LVEF. Associated findings of significant leucocytosis and mild AKI on admission.   PLAN:  Chest pain with troponemia Cardiac cath on 12/6 by [facility] cardiology ( Dr Eldridge Dace)  with clean coronaries and normal  LVEF. Cardiology consider the troponemia to be related to AKI however her creatinine was only 1.7 which clearly does not explain such high troponemia. Cath also does not show any signs of cardiomyopathy. Echo suggests LVH with ef OF 80% Will check for UTox which if positive for cocaine can explain the vasospasm She did have elevate CPK ( 900s) in proportion to CKMB Patient pain free at this time. Stable on tele  AKI creatinine on 1.7 on admission, now improving. Possibly related to dehydration associated with nausea and vomiting.  cont IV fluids    Viral gastroenteritis  symptoms of N,V and abd pain now resolved.  leucocytosis Possibly related to acute illness. Now resolved. Blood  cx so far negative. UA negative   Hypokalemia:  resolved       LOS: 2 days   Traci Mclaughlin 07/05/2011, 9:58 AM

## 2011-07-06 ENCOUNTER — Encounter (HOSPITAL_COMMUNITY): Payer: Self-pay | Admitting: Internal Medicine

## 2011-07-06 DIAGNOSIS — C539 Malignant neoplasm of cervix uteri, unspecified: Secondary | ICD-10-CM | POA: Diagnosis present

## 2011-07-06 DIAGNOSIS — K529 Noninfective gastroenteritis and colitis, unspecified: Secondary | ICD-10-CM | POA: Diagnosis present

## 2011-07-06 DIAGNOSIS — A084 Viral intestinal infection, unspecified: Secondary | ICD-10-CM | POA: Diagnosis present

## 2011-07-06 DIAGNOSIS — N179 Acute kidney failure, unspecified: Secondary | ICD-10-CM | POA: Diagnosis present

## 2011-07-06 DIAGNOSIS — R748 Abnormal levels of other serum enzymes: Secondary | ICD-10-CM | POA: Diagnosis present

## 2011-07-06 NOTE — Progress Notes (Signed)
1515 - pt d/c home with instructions. Pt verbalized understanding of need to find a PCP. Pt home with daughter, escorted self out per pt request.  Traci Mclaughlin 07/06/2011 4:16 PM

## 2011-07-06 NOTE — Discharge Summary (Signed)
Patient ID: Traci Mclaughlin MRN: [id number] DOB/AGE: 60-Feb-1952 60 y.o.  Admit date: 07/03/2011 Discharge date: 07/06/2011  Primary Care Physician:  No primary provider on file.  Discharge Diagnoses:    Present on Admission:  .Elevation of cardiac enzymes .Acute Viral gastroenteritis dehydration with hypokalemia .Acute kidney injury .Leucocytosis   Secondary discharge diagnosis  hx of cervical cancer   There are no discharge medications for this patient.   Disposition and Follow-up:  Patient follows up with her oncologist at [facility] Patient will be provided referral to arrange for outpatient PCP  Consults:  [facility]( cardiology)  Significant Diagnostic Studies:  Dg Chest Port 1 View  07/04/2011  *RADIOLOGY REPORT*  Clinical Data: Infarction.  Elevated white blood cell count.  PORTABLE CHEST - 1 VIEW  Comparison: None.  Findings: Elevation of the left hemidiaphragm.  Cardiopericardial silhouette appears within normal limits for volumes of inspiration. Mediastinal contours normal.  Basilar atelectasis.  No pneumothorax.  No edema.  IMPRESSION: Elevation of the left hemidiaphragm and low lung volumes.  No acute cardiopulmonary disease.  Basilar atelectasis.  Original Report Authenticated By: Andreas Newport, M.D.    Brief H and P: For complete details please refer to admission H and P, but in brief 60 -year-old woman who has a history of cervical cancer. Due to chemotherapy, she has severe hearing loss. She has been having intermittent chest pain over the past 3 days. She has been vomiting severely. She has not been able to keep any food down. She came to [facility] and had an ECG showing a right bundle branch block with ST elevation in the inferior and lateral leads. It was reported that there was ST depression in V1 and V2. She is transferred for emergent cardiac catheterization [facility].      Physical Exam on Discharge:  Filed Vitals:   07/05/11 1636 07/05/11  2100 07/06/11 0500 07/06/11 0553  BP: 121/78 118/74  123/77  Pulse: 75 80  78  Temp: 98 F (36.7 C) 98.5 F (36.9 C)  99.2 F (37.3 C)  TempSrc: Oral Oral  Oral  Resp: 18 19  18   Height:      Weight:   106.1 kg (233 lb 14.5 oz)   SpO2: 92% 98%  96%     Intake/Output Summary (Last 24 hours) at 07/06/11 1238 Last data filed at 07/06/11 0800  Gross per 24 hour  Intake   2020 ml  Output    250 ml  Net   1770 ml   General: elderly obese female in no acute distress. Hard of hearing  HEENT: no pallor, no icterus, moist oral mucosa, no JVD, no lymphadenopathy, poor dentition with bad odour  Heart: Normal s1 &s2 Regular rate and rhythm, without murmurs, rubs, gallops.  Lungs: Clear to auscultation bilaterally.  Abdomen: Soft, nontender, nondistended, positive bowel sounds.  Extremities: No clubbing cyanosis or edema with positive pedal pulses.  Neuro: Alert, awake, oriented x3, nonfocal.   CBC:    Component Value Date/Time   WBC 10.7* 07/05/2011 0525   HGB 11.5* 07/05/2011 0525   HCT 34.3* 07/05/2011 0525   PLT 190 07/05/2011 0525   MCV 97.2 07/05/2011 0525   NEUTROABS 14.2* 07/03/2011 1822   LYMPHSABS 3.6 07/03/2011 1822   MONOABS 2.2* 07/03/2011 1822   EOSABS 0.0 07/03/2011 1822   BASOSABS 0.0 07/03/2011 1822    Basic Metabolic Panel:    Component Value Date/Time   NA 138 07/05/2011 0525   K 3.6 07/05/2011 0525  CL 109 07/05/2011 0525   CO2 23 07/05/2011 0525   BUN 18 07/05/2011 0525   CREATININE 1.18* 07/05/2011 0525   GLUCOSE 99 07/05/2011 0525   CALCIUM 7.8* 07/05/2011 0525    Hospital Course:  Chest pain with troponemia  Patient admited to cardiology with chest pain symptms , EKG changes and positive troponins( 5.03) Cardiac cath on 12/6 by [facility] cardiology ( Dr Eldridge Dace) with clean coronaries and normal LVEF. Cardiology consider the troponemia to be related to AKI however her creatinine was only 1.7 which clearly does not explain such high troponemia. Cath also does not  show any signs of cardiomyopathy. Echo suggests LVH with EF of 80%  checked for UTox which was negative for cocaine.  She did have elevate CPK ( 900s) in proportion to CKMB  Patient pain free at this time and well hydrated. Given findings of LVH on echo patient possibly has underlying hypertension however was noted to ahe borderline BP so no meds started Stable on tele   AKI  creatinine on 1.7 on admission, now improving. Today at 1.18. Do not know what her baseline is. Possibly related to dehydration associated with nausea and vomiting.  Stable with IV fluids  Viral gastroenteritis  symptoms of N,V and abd pain now resolved.   leucocytosis  Possibly related to acute illness. Now resolved. Blood cx so far negative. UA negative   Hypokalemia:  resolved   Anemia  patient does have mild anemia and again no previus records on file. She sees her oncologist at [facility] and does not have a PCP.  An arrangement is being made to assign a PCP for her.  Patient clinically stable to be discharged home.  Time spent on Discharge: 45 minutes  Signed: Eddie North 07/06/2011, 12:38 PM

## 2011-07-06 NOTE — Progress Notes (Signed)
07/06/2011 [facility] Clos [facility] Case Management Note [phone]    CARE MANAGEMENT NOTE 07/06/2011  Patient:  [facility]   Account Number:  [id number]  Date Initiated:  07/06/2011  Documentation initiated by:  Fransico Michael  Subjective/Objective Assessment:   admitted on 07/03/11 with chest pain and elevated enzymes     Action/Plan:   Prior to admission, patient lived at home with family support   Anticipated DC Date:  07/06/2011   Anticipated DC Plan:  HOME/SELF CARE      DC Planning Services  CM consult      Choice offered to / List presented to:             Status of service:  Completed, signed off Medicare Important Message given?   (If response is "NO", the following Medicare IM given date fields will be blank) Date Medicare IM given:   Date Additional Medicare IM given:    Discharge Disposition:  HOME/SELF CARE  Per UR Regulation:  Reviewed for med. necessity/level of care/duration of stay  Comments:  07/06/11-1103-J.Lutricia Horsfall  [phone]      60yo female patient admitted on 07/03/11 with c/o chest pain and elevated enzymes. Prior to admission, patient lived at home with family support. Independent with ADLs. No PCP identified in records. In to speak with patient regarding lack of medical doctor. Instructions and information for [facility] given to patient. Voiced no questions or concerns. No further discharge needs identified. Patient is being discharged home today to selfcare.

## 2011-07-07 LAB — THC (MARIJUANA), URINE, CONFIRMATION: Marijuana, Ur-Confirmation: 45 NG/ML — ABNORMAL HIGH

## 2011-07-07 NOTE — Progress Notes (Signed)
Retro ur ins reivew 

## 2011-07-10 LAB — CULTURE, BLOOD (SINGLE)
Culture  Setup Time: 201212081817
Culture: NO GROWTH

## 2011-07-10 LAB — CULTURE, BLOOD (ROUTINE X 2): Culture: NO GROWTH

## 2013-07-27 DIAGNOSIS — I82409 Acute embolism and thrombosis of unspecified deep veins of unspecified lower extremity: Secondary | ICD-10-CM

## 2013-07-27 HISTORY — PX: HX ABOVE KNEE AMPUTATION: SHX117

## 2013-07-27 HISTORY — DX: Acute embolism and thrombosis of unspecified deep veins of unspecified lower extremity (CMS HCC): I82.409

## 2013-10-18 ENCOUNTER — Ambulatory Visit: Payer: Medicare Other | Admitting: Physician Assistant

## 2013-10-18 DIAGNOSIS — Z0289 Encounter for other administrative examinations: Secondary | ICD-10-CM

## 2014-02-17 ENCOUNTER — Emergency Department (HOSPITAL_BASED_OUTPATIENT_CLINIC_OR_DEPARTMENT_OTHER): Payer: Medicare Other

## 2014-02-17 ENCOUNTER — Encounter (HOSPITAL_BASED_OUTPATIENT_CLINIC_OR_DEPARTMENT_OTHER): Payer: Self-pay | Admitting: Emergency Medicine

## 2014-02-17 ENCOUNTER — Emergency Department (HOSPITAL_BASED_OUTPATIENT_CLINIC_OR_DEPARTMENT_OTHER)
Admission: EM | Admit: 2014-02-17 | Discharge: 2014-02-17 | Disposition: A | Discharge status: Home / Self Care | Payer: Medicare Other | Attending: Emergency Medicine | Admitting: Emergency Medicine

## 2014-02-17 DIAGNOSIS — Z8541 Personal history of malignant neoplasm of cervix uteri: Secondary | ICD-10-CM | POA: Insufficient documentation

## 2014-02-17 DIAGNOSIS — Z87828 Personal history of other (healed) physical injury and trauma: Secondary | ICD-10-CM | POA: Insufficient documentation

## 2014-02-17 DIAGNOSIS — M79609 Pain in unspecified limb: Secondary | ICD-10-CM | POA: Diagnosis present

## 2014-02-17 DIAGNOSIS — I10 Essential (primary) hypertension: Secondary | ICD-10-CM | POA: Diagnosis not present

## 2014-02-17 DIAGNOSIS — R112 Nausea with vomiting, unspecified: Secondary | ICD-10-CM | POA: Insufficient documentation

## 2014-02-17 DIAGNOSIS — Z9889 Other specified postprocedural states: Secondary | ICD-10-CM | POA: Insufficient documentation

## 2014-02-17 DIAGNOSIS — I252 Old myocardial infarction: Secondary | ICD-10-CM | POA: Insufficient documentation

## 2014-02-17 DIAGNOSIS — M79605 Pain in left leg: Secondary | ICD-10-CM

## 2014-02-17 DIAGNOSIS — Z87891 Personal history of nicotine dependence: Secondary | ICD-10-CM | POA: Diagnosis not present

## 2014-02-17 LAB — BASIC METABOLIC PANEL
Anion gap: 15 (ref 5–15)
BUN: 34 mg/dL — AB (ref 6–23)
CALCIUM: 10 mg/dL (ref 8.4–10.5)
CHLORIDE: 102 meq/L (ref 96–112)
CO2: 23 meq/L (ref 19–32)
CREATININE: 1.6 mg/dL — AB (ref 0.50–1.10)
GFR calc Af Amer: 39 mL/min — ABNORMAL LOW (ref >90)
GFR calc non Af Amer: 33 mL/min — ABNORMAL LOW (ref >90)
Glucose, Bld: 159 mg/dL — ABNORMAL HIGH (ref 70–99)
Potassium: 3.6 mEq/L — ABNORMAL LOW (ref 3.7–5.3)
Sodium: 140 mEq/L (ref 137–147)

## 2014-02-17 MED ORDER — AMLODIPINE BESYLATE 5 MG PO TABS: 5.0000 mg | ORAL_TABLET | Freq: Every day | ORAL | Status: DC

## 2014-02-17 MED ORDER — HYDROCODONE-ACETAMINOPHEN 5-325 MG PO TABS
1.0000 | ORAL_TABLET | Freq: Once | ORAL | Status: AC
  Administered 2014-02-17: 1 via ORAL
  Filled 2014-02-17: qty 1

## 2014-02-17 MED ORDER — HYDROCODONE-ACETAMINOPHEN 5-325 MG PO TABS: 1.0000 | ORAL_TABLET | ORAL | Status: DC | PRN

## 2014-02-17 NOTE — ED Notes (Signed)
Pt adds that she has not had a BM x1 week.

## 2014-02-17 NOTE — ED Provider Notes (Signed)
CSN: [id number]     Arrival date & time 02/17/14  1143 History   First MD Initiated Contact with Patient 02/17/14 1232     Chief Complaint  Patient presents with  . Leg Pain     (Consider location/radiation/quality/duration/timing/severity/associated sxs/prior Treatment) Patient is a 63 y.o. female presenting with leg pain. The history is provided by the patient. No language interpreter was used.  Leg Pain Location:  Leg, ankle and foot Time since incident:  4 days Leg location:  L leg Ankle location:  L ankle Foot location:  L foot Pain details:    Quality:  Aching Chronicity:  New Dislocation: no   Foreign body present:  No foreign bodies Associated symptoms: no fever   Associated symptoms comment:  She states that for the past 3-4 days her left leg as been hurting, aching, from the medial foot to the upper thigh. No known injury or history of leg pain. She also reports nausea with limited vomiting on day one but none since. No fever. No CP, SOB.    Past Medical History  Diagnosis Date  . Cancer     cervical; was on chemo; has been in remission for 60yrs  . STEMI (ST elevation myocardial infarction)   . Acute kidney injury 07/06/2011   Past Surgical History  Procedure Laterality Date  . Cardiac catheterization      07/03/2011   No family history on file. History  Substance Use Topics  . Smoking status: Former Smoker -- 1.00 packs/day for 1 years    Types: Cigarettes  . Smokeless tobacco: Former Systems developer    Quit date: 06/16/2011  . Alcohol Use: Yes     Comment: occassional   OB History   Grav Para Term Preterm Abortions TAB SAB Ect Mult Living                 Review of Systems  Constitutional: Negative for fever.  Respiratory: Negative for cough and shortness of breath.   Gastrointestinal: Positive for nausea and vomiting.       See HPI.  Musculoskeletal:       See HPI.  Skin: Negative.   Neurological: Negative for weakness and numbness.      Allergies   Review of patient's allergies indicates no known allergies.  Home Medications   Prior to Admission medications   Not on File   BP 222/109  Pulse 106  Temp(Src) 97.9 F (36.6 C) (Oral)  Resp 20  Ht 5\' 3"  (1.6 m)  Wt 200 lb (90.719 kg)  BMI 35.44 kg/m2  SpO2 96% Physical Exam  Constitutional: She is oriented to person, place, and time. She appears well-developed and well-nourished. She appears distressed.  Musculoskeletal:  Left leg without swelling or discoloration. Tender to palpation of medial foot, posterior calf and medial/lateral thigh.   Neurological: She is alert and oriented to person, place, and time.  Slightly decreased sensation left leg to light touch.  Skin: Skin is warm and dry.  Psychiatric: She has a normal mood and affect.    ED Course  Procedures (including critical care time) Labs Review Labs Reviewed - No data to display  Imaging Review US Venous Img Lower Unilateral Left  02/17/2014   CLINICAL DATA:  Left leg pain  EXAM: LEFT LOWER EXTREMITY VENOUS DOPPLER ULTRASOUND  TECHNIQUE: Gray-scale sonography with graded compression, as well as color Doppler and duplex ultrasound were performed to evaluate the lower extremity deep venous systems from the level of the common femoral vein  and including the common femoral, femoral, profunda femoral, popliteal and calf veins including the posterior tibial, peroneal and gastrocnemius veins when visible. The superficial great saphenous vein was also interrogated. Spectral Doppler was utilized to evaluate flow at rest and with distal augmentation maneuvers in the common femoral, femoral and popliteal veins.  COMPARISON:  None.  FINDINGS: Common Femoral Vein: No evidence of thrombus. Normal compressibility, respiratory phasicity and response to augmentation.  Saphenofemoral Junction: No evidence of thrombus. Normal compressibility and flow on color Doppler imaging.  Profunda Femoral Vein: No evidence of thrombus. Normal  compressibility and flow on color Doppler imaging.  Femoral Vein: No evidence of thrombus. Normal compressibility, respiratory phasicity and response to augmentation.  Popliteal Vein: No evidence of thrombus. Normal compressibility, respiratory phasicity and response to augmentation.  Calf Veins: No evidence of thrombus. Normal compressibility and flow on color Doppler imaging.  Superficial Great Saphenous Vein: No evidence of thrombus. Normal compressibility and flow on color Doppler imaging.  Venous Reflux:  None.  Other Findings:  None.  IMPRESSION: No evidence of deep venous thrombosis.   Electronically Signed   By: Daryll Brod M.D.   On: 02/17/2014 13:49     EKG Interpretation None      MDM   Final diagnoses:  None    1. Left leg pain 2. Asymptomatic hypertension  No symptoms of CAD (no CP, SOB). No history of hypertension per patient and family. Refer to PCP for recheck of high blood pressure today in one week. Will treat lower extremity pain symptomatically and encourage PCP follow up for recheck.     Dewaine Oats, PA-C 02/17/14 1514

## 2014-02-17 NOTE — ED Notes (Signed)
Pt sts she was vomiting 2 days ago and then yesterday noticed that her left leg was numb and then painful when trying to walk on it.

## 2014-02-17 NOTE — Discharge Instructions (Signed)
FOLLOW UP WITH YOUR DOCTOR FOR RECHECK OF HIGH BLOOD PRESSURE AND LEG PAIN THIS WEEK. RETURN HERE WITH ANY WORSENING SYMPTOMS TO NEW CONCERNS.   Hypertension Hypertension, commonly called high blood pressure, is when the force of blood pumping through your arteries is too strong. Your arteries are the blood vessels that carry blood from your heart throughout your body. A blood pressure reading consists of a higher number over a lower number, such as 110/72. The higher number (systolic) is the pressure inside your arteries when your heart pumps. The lower number (diastolic) is the pressure inside your arteries when your heart relaxes. Ideally you want your blood pressure below 120/80. Hypertension forces your heart to work harder to pump blood. Your arteries may become narrow or stiff. Having hypertension puts you at risk for heart disease, stroke, and other problems.  RISK FACTORS Some risk factors for high blood pressure are controllable. Others are not.  Risk factors you cannot control include:   Race. You may be at higher risk if you are African American.  Age. Risk increases with age.  Gender. Men are at higher risk than women before age 82 years. After age 22, women are at higher risk than men. Risk factors you can control include:  Not getting enough exercise or physical activity.  Being overweight.  Getting too much fat, sugar, calories, or salt in your diet.  Drinking too much alcohol. SIGNS AND SYMPTOMS Hypertension does not usually cause signs or symptoms. Extremely high blood pressure (hypertensive crisis) may cause headache, anxiety, shortness of breath, and nosebleed. DIAGNOSIS  To check if you have hypertension, your health care provider will measure your blood pressure while you are seated, with your arm held at the level of your heart. It should be measured at least twice using the same arm. Certain conditions can cause a difference in blood pressure between your right and  left arms. A blood pressure reading that is higher than normal on one occasion does not mean that you need treatment. If one blood pressure reading is high, ask your health care provider about having it checked again. TREATMENT  Treating high blood pressure includes making lifestyle changes and possibly taking medicine. Living a healthy lifestyle can help lower high blood pressure. You may need to change some of your habits. Lifestyle changes may include:  Following the DASH diet. This diet is high in fruits, vegetables, and whole grains. It is low in salt, red meat, and added sugars.  Getting at least 2 hours of brisk physical activity every week.  Losing weight if necessary.  Not smoking.  Limiting alcoholic beverages.  Learning ways to reduce stress. If lifestyle changes are not enough to get your blood pressure under control, your health care provider may prescribe medicine. You may need to take more than one. Work closely with your health care provider to understand the risks and benefits. HOME CARE INSTRUCTIONS  Have your blood pressure rechecked as directed by your health care provider.   Take medicines only as directed by your health care provider. Follow the directions carefully. Blood pressure medicines must be taken as prescribed. The medicine does not work as well when you skip doses. Skipping doses also puts you at risk for problems.   Do not smoke.   Monitor your blood pressure at home as directed by your health care provider. SEEK MEDICAL CARE IF:   You think you are having a reaction to medicines taken.  You have recurrent headaches or feel dizzy.  You have swelling in your ankles.  You have trouble with your vision. SEEK IMMEDIATE MEDICAL CARE IF:  You develop a severe headache or confusion.  You have unusual weakness, numbness, or feel faint.  You have severe chest or abdominal pain.  You vomit repeatedly.  You have trouble breathing. MAKE SURE  YOU:   Understand these instructions.  Will watch your condition.  Will get help right away if you are not doing well or get worse. Document Released: 07/13/2005 Document Revised: 11/27/2013 Document Reviewed: 05/05/2013 [facility] Patient Information 2015 [place], [place]. This information is not intended to replace advice given to you by your health care provider. Make sure you discuss any questions you have with your health care provider.

## 2014-02-17 NOTE — ED Provider Notes (Signed)
Medical screening examination/treatment/procedure(s) were conducted as a shared visit with non-physician practitioner(s) and myself.  I personally evaluated the patient during the encounter.   EKG Interpretation None       Orlie Dakin, MD 02/17/14 [phone]

## 2014-02-17 NOTE — ED Provider Notes (Signed)
Patient complains of leg pain today. Patient states pain is worse with walking. She vomited 2 or 3x2 days ago. No nausea today. She is not ill-appearing She has not been to physician in 3 years. Lab work from 2012 showed renal insufficiency. Today she is noted to be hypertensive. In light of prior renal insufficiency, will recheck basic metabolic to check renal function. Will start on antihypertensive. I have have explained to the patient and her daughter that she requires close followup for blood pressure. Blood pressure should be rechecked in a week  Orlie Dakin, MD 02/17/14 1537

## 2014-02-24 DIAGNOSIS — I639 Cerebral infarction, unspecified: Secondary | ICD-10-CM | POA: Insufficient documentation

## 2014-02-24 HISTORY — DX: Cerebral infarction, unspecified (CMS HCC): I63.9

## 2014-02-24 HISTORY — PX: HX OTHER: 2100001105

## 2014-02-28 ENCOUNTER — Emergency Department (INDEPENDENT_AMBULATORY_CARE_PROVIDER_SITE_OTHER): Payer: Medicare Other

## 2014-02-28 ENCOUNTER — Emergency Department (HOSPITAL_COMMUNITY)
Admission: EM | Admit: 2014-02-28 | Discharge: 2014-02-28 | Disposition: A | Discharge status: Home / Self Care | Payer: Medicare Other | Source: Home / Self Care | Attending: Family Medicine | Admitting: Family Medicine

## 2014-02-28 ENCOUNTER — Encounter (HOSPITAL_COMMUNITY): Payer: Self-pay | Admitting: Emergency Medicine

## 2014-02-28 DIAGNOSIS — IMO0002 Reserved for concepts with insufficient information to code with codable children: Secondary | ICD-10-CM

## 2014-02-28 DIAGNOSIS — S86912A Strain of unspecified muscle(s) and tendon(s) at lower leg level, left leg, initial encounter: Secondary | ICD-10-CM

## 2014-02-28 DIAGNOSIS — S8780XA Crushing injury of unspecified lower leg, initial encounter: Secondary | ICD-10-CM

## 2014-02-28 MED ORDER — DICLOFENAC SODIUM 1 % TD GEL: 4.0000 g | Freq: Four times a day (QID) | TRANSDERMAL | Status: DC

## 2014-02-28 NOTE — ED Provider Notes (Signed)
CSN: [id number]     Arrival date & time 02/28/14  1903 History   First MD Initiated Contact with Patient 02/28/14 1921     Chief Complaint  Patient presents with  . Leg Pain   (Consider location/radiation/quality/duration/timing/severity/associated sxs/prior Treatment) Patient is a 63 y.o. female presenting with leg pain. The history is provided by the patient and a relative.  Leg Pain Location:  Knee Time since incident:  2 weeks Injury: no   Knee location:  L knee Pain details:    Severity:  Moderate   Onset quality:  Gradual   Progression:  Worsening (seen [facility] with neg doppler of left leg.  ) Chronicity:  New Dislocation: no   Associated symptoms: decreased ROM, numbness and stiffness   Associated symptoms: no swelling   Risk factors: obesity     Past Medical History  Diagnosis Date  . Cancer     cervical; was on chemo; has been in remission for 24yrs  . STEMI (ST elevation myocardial infarction)   . Acute kidney injury 07/06/2011   Past Surgical History  Procedure Laterality Date  . Cardiac catheterization      07/03/2011   History reviewed. No pertinent family history. History  Substance Use Topics  . Smoking status: Former Smoker -- 1.00 packs/day for 1 years    Types: Cigarettes  . Smokeless tobacco: Former Systems developer    Quit date: 06/16/2011  . Alcohol Use: Yes     Comment: occassional   OB History   Grav Para Term Preterm Abortions TAB SAB Ect Mult Living                 Review of Systems  Constitutional: Negative.   Gastrointestinal: Negative.   Genitourinary: Negative.   Musculoskeletal: Positive for gait problem and stiffness. Negative for joint swelling and myalgias.  Skin: Negative.     Allergies  Review of patient's allergies indicates no known allergies.  Home Medications   Prior to Admission medications   Medication Sig Start Date End Date Taking? Authorizing Provider  amLODipine (NORVASC) 5 MG tablet Take 1 tablet (5 mg total) by  mouth daily. 02/17/14   Shari A Upstill, PA-C  diclofenac sodium (VOLTAREN) 1 % GEL Apply 4 g topically 4 (four) times daily. To left knee 02/28/14   Billy Fischer, MD  HYDROcodone-acetaminophen (NORCO/VICODIN) 5-325 MG per tablet Take 1-2 tablets by mouth every 4 (four) hours as needed. 02/17/14   Shari A Upstill, PA-C   BP 178/113  Pulse 102  Temp(Src) 98.5 F (36.9 C) (Oral)  SpO2 96% Physical Exam  Nursing note and vitals reviewed. Constitutional: She is oriented to person, place, and time. She appears well-developed and well-nourished.  Musculoskeletal: She exhibits tenderness.       Left knee: She exhibits deformity, abnormal alignment and MCL laxity. She exhibits normal range of motion, no swelling and no effusion. Tenderness found. Medial joint line tenderness noted.       Legs: Neurological: She is alert and oriented to person, place, and time.  Skin: Skin is warm and dry.    ED Course  Procedures (including critical care time) Labs Review Labs Reviewed - No data to display  Imaging Review Dg Knee 2 Views Left  02/28/2014   CLINICAL DATA:  LEG PAIN LEG PAIN  EXAM: LEFT KNEE - 1-2 VIEW  COMPARISON:  None.  FINDINGS: There is no evidence of fracture, dislocation, or joint effusion. There is no evidence of arthropathy or other focal bone  abnormality. Soft tissues are unremarkable.  IMPRESSION: Negative.   Electronically Signed   By: Arne Cleveland M.D.   On: 02/28/2014 19:50     MDM   1. Strain of knee and leg, left, initial encounter       Billy Fischer, MD 02/28/14 2003

## 2014-02-28 NOTE — Discharge Instructions (Signed)
Ice and medicine and brace as needed, see orthopedist if further problems.

## 2014-02-28 NOTE — ED Notes (Signed)
C/o left leg pain for two weeks now States she was seen at the [place] [facility] for the same problem States feels as if feet goes numb Hydrocodone was given in ER and was taking as tx

## 2014-03-05 ENCOUNTER — Emergency Department (HOSPITAL_COMMUNITY): Payer: Medicare Other

## 2014-03-05 ENCOUNTER — Encounter (HOSPITAL_COMMUNITY): Payer: Self-pay | Admitting: Emergency Medicine

## 2014-03-05 ENCOUNTER — Inpatient Hospital Stay (HOSPITAL_COMMUNITY)
Admission: EM | Admit: 2014-03-05 | Discharge: 2014-03-12 | DRG: 040 | Disposition: A | Discharge status: Rehabilitation Facility | Payer: Medicare Other | Attending: Internal Medicine | Admitting: Internal Medicine

## 2014-03-05 ENCOUNTER — Inpatient Hospital Stay (HOSPITAL_COMMUNITY): Payer: Medicare Other

## 2014-03-05 DIAGNOSIS — I252 Old myocardial infarction: Secondary | ICD-10-CM

## 2014-03-05 DIAGNOSIS — R4701 Aphasia: Secondary | ICD-10-CM | POA: Diagnosis present

## 2014-03-05 DIAGNOSIS — I4510 Unspecified right bundle-branch block: Secondary | ICD-10-CM | POA: Diagnosis present

## 2014-03-05 DIAGNOSIS — R471 Dysarthria and anarthria: Secondary | ICD-10-CM | POA: Diagnosis present

## 2014-03-05 DIAGNOSIS — Z791 Long term (current) use of non-steroidal anti-inflammatories (NSAID): Secondary | ICD-10-CM | POA: Diagnosis not present

## 2014-03-05 DIAGNOSIS — R748 Abnormal levels of other serum enzymes: Secondary | ICD-10-CM | POA: Diagnosis present

## 2014-03-05 DIAGNOSIS — G936 Cerebral edema: Secondary | ICD-10-CM | POA: Diagnosis present

## 2014-03-05 DIAGNOSIS — F1210 Cannabis abuse, uncomplicated: Secondary | ICD-10-CM | POA: Diagnosis present

## 2014-03-05 DIAGNOSIS — R1310 Dysphagia, unspecified: Secondary | ICD-10-CM | POA: Diagnosis present

## 2014-03-05 DIAGNOSIS — N183 Chronic kidney disease, stage 3 unspecified: Secondary | ICD-10-CM | POA: Diagnosis present

## 2014-03-05 DIAGNOSIS — R29898 Other symptoms and signs involving the musculoskeletal system: Secondary | ICD-10-CM | POA: Diagnosis present

## 2014-03-05 DIAGNOSIS — I639 Cerebral infarction, unspecified: Secondary | ICD-10-CM | POA: Diagnosis present

## 2014-03-05 DIAGNOSIS — H9190 Unspecified hearing loss, unspecified ear: Secondary | ICD-10-CM | POA: Diagnosis present

## 2014-03-05 DIAGNOSIS — R4789 Other speech disturbances: Secondary | ICD-10-CM | POA: Diagnosis present

## 2014-03-05 DIAGNOSIS — R778 Other specified abnormalities of plasma proteins: Secondary | ICD-10-CM | POA: Diagnosis present

## 2014-03-05 DIAGNOSIS — I129 Hypertensive chronic kidney disease with stage 1 through stage 4 chronic kidney disease, or unspecified chronic kidney disease: Secondary | ICD-10-CM | POA: Diagnosis present

## 2014-03-05 DIAGNOSIS — E785 Hyperlipidemia, unspecified: Secondary | ICD-10-CM | POA: Diagnosis present

## 2014-03-05 DIAGNOSIS — I214 Non-ST elevation (NSTEMI) myocardial infarction: Secondary | ICD-10-CM | POA: Diagnosis present

## 2014-03-05 DIAGNOSIS — N179 Acute kidney failure, unspecified: Secondary | ICD-10-CM

## 2014-03-05 DIAGNOSIS — G8191 Hemiplegia, unspecified affecting right dominant side: Secondary | ICD-10-CM

## 2014-03-05 DIAGNOSIS — G8190 Hemiplegia, unspecified affecting unspecified side: Secondary | ICD-10-CM | POA: Diagnosis present

## 2014-03-05 DIAGNOSIS — Z87891 Personal history of nicotine dependence: Secondary | ICD-10-CM

## 2014-03-05 DIAGNOSIS — I672 Cerebral atherosclerosis: Secondary | ICD-10-CM | POA: Diagnosis present

## 2014-03-05 DIAGNOSIS — R29810 Facial weakness: Secondary | ICD-10-CM | POA: Diagnosis present

## 2014-03-05 DIAGNOSIS — I745 Embolism and thrombosis of iliac artery: Secondary | ICD-10-CM | POA: Diagnosis present

## 2014-03-05 DIAGNOSIS — Z8541 Personal history of malignant neoplasm of cervix uteri: Secondary | ICD-10-CM | POA: Diagnosis not present

## 2014-03-05 DIAGNOSIS — Z5189 Encounter for other specified aftercare: Secondary | ICD-10-CM | POA: Diagnosis not present

## 2014-03-05 DIAGNOSIS — I998 Other disorder of circulatory system: Secondary | ICD-10-CM

## 2014-03-05 DIAGNOSIS — R7989 Other specified abnormal findings of blood chemistry: Secondary | ICD-10-CM

## 2014-03-05 DIAGNOSIS — I10 Essential (primary) hypertension: Secondary | ICD-10-CM | POA: Diagnosis present

## 2014-03-05 DIAGNOSIS — I6350 Cerebral infarction due to unspecified occlusion or stenosis of unspecified cerebral artery: Principal | ICD-10-CM | POA: Diagnosis present

## 2014-03-05 HISTORY — DX: Essential (primary) hypertension: I10

## 2014-03-05 HISTORY — DX: Cerebral infarction, unspecified: I63.9

## 2014-03-05 HISTORY — DX: Unspecified hearing loss, unspecified ear: H91.90

## 2014-03-05 LAB — COMPREHENSIVE METABOLIC PANEL
ALK PHOS: 60 U/L (ref 39–117)
ALT: 15 U/L (ref 0–35)
AST: 24 U/L (ref 0–37)
Albumin: 3.8 g/dL (ref 3.5–5.2)
Anion gap: 16 — ABNORMAL HIGH (ref 5–15)
BILIRUBIN TOTAL: 0.5 mg/dL (ref 0.3–1.2)
BUN: 14 mg/dL (ref 6–23)
CHLORIDE: 102 meq/L (ref 96–112)
CO2: 22 meq/L (ref 19–32)
Calcium: 9.2 mg/dL (ref 8.4–10.5)
Creatinine, Ser: 1.1 mg/dL (ref 0.50–1.10)
GFR calc non Af Amer: 52 mL/min — ABNORMAL LOW (ref >90)
GFR, EST AFRICAN AMERICAN: 61 mL/min — AB (ref >90)
GLUCOSE: 93 mg/dL (ref 70–99)
POTASSIUM: 4.1 meq/L (ref 3.7–5.3)
SODIUM: 140 meq/L (ref 137–147)
TOTAL PROTEIN: 7.4 g/dL (ref 6.0–8.3)

## 2014-03-05 LAB — DIFFERENTIAL
BASOS PCT: 0 % (ref 0–1)
Basophils Absolute: 0 10*3/uL (ref 0.0–0.1)
EOS PCT: 1 % (ref 0–5)
Eosinophils Absolute: 0.1 10*3/uL (ref 0.0–0.7)
LYMPHS PCT: 15 % (ref 12–46)
Lymphs Abs: 1.5 10*3/uL (ref 0.7–4.0)
MONOS PCT: 7 % (ref 3–12)
Monocytes Absolute: 0.7 10*3/uL (ref 0.1–1.0)
NEUTROS ABS: 7.5 10*3/uL (ref 1.7–7.7)
NEUTROS PCT: 77 % (ref 43–77)
Smear Review: DECREASED

## 2014-03-05 LAB — APTT: APTT: 40 s — AB (ref 24–37)

## 2014-03-05 LAB — LIPID PANEL
Cholesterol: 191 mg/dL (ref 0–200)
HDL: 48 mg/dL (ref >39)
LDL Cholesterol: 122 mg/dL — ABNORMAL HIGH (ref 0–99)
TRIGLYCERIDES: 106 mg/dL (ref <150)
Total CHOL/HDL Ratio: 4 RATIO
VLDL: 21 mg/dL (ref 0–40)

## 2014-03-05 LAB — CBC
HEMATOCRIT: 43.6 % (ref 36.0–46.0)
HEMOGLOBIN: 14.9 g/dL (ref 12.0–15.0)
MCH: 33.6 pg (ref 26.0–34.0)
MCHC: 34.2 g/dL (ref 30.0–36.0)
MCV: 98.4 fL (ref 78.0–100.0)
Platelets: DECREASED 10*3/uL (ref 150–400)
RBC: 4.43 MIL/uL (ref 3.87–5.11)
RDW: 12.8 % (ref 11.5–15.5)
WBC: 9.8 10*3/uL (ref 4.0–10.5)

## 2014-03-05 LAB — PROTIME-INR
INR: 1.07 (ref 0.00–1.49)
Prothrombin Time: 13.9 seconds (ref 11.6–15.2)

## 2014-03-05 LAB — URINALYSIS, ROUTINE W REFLEX MICROSCOPIC
Bilirubin Urine: NEGATIVE
Glucose, UA: NEGATIVE mg/dL
HGB URINE DIPSTICK: NEGATIVE
Ketones, ur: 15 mg/dL — AB
Leukocytes, UA: NEGATIVE
Nitrite: NEGATIVE
PH: 7.5 (ref 5.0–8.0)
Protein, ur: NEGATIVE mg/dL
SPECIFIC GRAVITY, URINE: 1.01 (ref 1.005–1.030)
Urobilinogen, UA: 1 mg/dL (ref 0.0–1.0)

## 2014-03-05 LAB — I-STAT TROPONIN, ED: Troponin i, poc: 0.23 ng/mL (ref 0.00–0.08)

## 2014-03-05 LAB — CBG MONITORING, ED: Glucose-Capillary: 101 mg/dL — ABNORMAL HIGH (ref 70–99)

## 2014-03-05 LAB — TROPONIN I
Troponin I: 0.37 ng/mL (ref <0.30)
Troponin I: 0.57 ng/mL (ref <0.30)

## 2014-03-05 MED ORDER — ASPIRIN 325 MG PO TABS
325.0000 mg | ORAL_TABLET | Freq: Every day | ORAL | Status: DC
  Administered 2014-03-07: 325 mg via ORAL
  Filled 2014-03-05: qty 1

## 2014-03-05 MED ORDER — SODIUM CHLORIDE 0.9 % IV SOLN: INTRAVENOUS | Status: DC

## 2014-03-05 MED ORDER — SODIUM CHLORIDE 0.9 % IV SOLN
INTRAVENOUS | Status: DC
  Administered 2014-03-05: 16:00:00 via INTRAVENOUS

## 2014-03-05 MED ORDER — ASPIRIN 300 MG RE SUPP
300.0000 mg | Freq: Every day | RECTAL | Status: DC
  Administered 2014-03-05 – 2014-03-06 (×2): 300 mg via RECTAL
  Filled 2014-03-05 (×2): qty 1

## 2014-03-05 MED ORDER — STROKE: EARLY STAGES OF RECOVERY BOOK
Freq: Once | Status: DC
  Filled 2014-03-05: qty 1

## 2014-03-05 NOTE — H&P (Signed)
Date: 03/05/2014               Patient Name:  Traci Mclaughlin MRN: [id number]  DOB: [birth year] Age / Sex: 63 y.o., female   PCP: No primary provider on file.         Medical Service: Internal Medicine Teaching Service         Attending Physician: Dr. Carlyle Basques, MD    First Contact: Dr. Genene Churn Pager: [phone]  Second Contact: Dr. Hayes Ludwig Pager: [phone]       After Hours (After 5p/  First Contact Pager: [phone]  weekends / holidays): Second Contact Pager: [phone]   Chief Complaint: right sided facial droop and slurred speech  History of Present Illness:   63 yo female with hx of MI 06/2011, HTN, cervical cancer here with right sided facial droop and slurrech speech since yesterday. She has been complaining of LLE pain for past 1 month with several ED visits. Doppler was negative. She started to act differently since yesterday and has been tearful. Her speech has become slurred and started to have drooling. She did not walk to come to the hospital but family convinced her to come today. She is heard of hearing and we had to communicate by writing on paper. She has been tearful during the interview and cannot talk other than saying small words such as "Ok" or "no". She denies chest pain or SOB.  Meds: Current Facility-Administered Medications  Medication Dose Route Frequency Provider Last Rate Last Dose  . 0.9 %  sodium chloride infusion   Intravenous STAT Francine Graven, DO        Allergies: Allergies as of 03/05/2014  . (No Known Allergies)   Past Medical History  Diagnosis Date  . Cancer     cervical; was on chemo; has been in remission for 80yrs  . STEMI (ST elevation myocardial infarction) 06/2011  . Acute kidney injury 07/06/2011  . Hypertension   . HOH (hard of hearing)    Past Surgical History  Procedure Laterality Date  . Cardiac catheterization  07/03/2011    Dr. Irish Lack   History reviewed. No pertinent family history. History   Social History  .  Marital Status: Single    Spouse Name: N/A    Number of Children: N/A  . Years of Education: N/A   Occupational History  . Not on file.   Social History Main Topics  . Smoking status: Former Smoker -- 1.00 packs/day for 1 years    Types: Cigarettes  . Smokeless tobacco: Former Systems developer    Quit date: 06/16/2011  . Alcohol Use: Yes     Comment: occassional  . Drug Use: No  . Sexual Activity: Not on file   Other Topics Concern  . Not on file   Social History Narrative  . No narrative on file    Review of Systems: ROS Could not perform because she of her asphasia and HOH.  But denies chest pain and SOB.  Physical Exam: Blood pressure 156/78, pulse 86, temperature 98.9 F (37.2 C), temperature source Oral, resp. rate 20, SpO2 98.00%. Physical Exam  Constitutional: She appears well-developed and well-nourished. She appears distressed.  Tearful.  HENT:  Head: Normocephalic and atraumatic.  Right Ear: External ear normal.  Left Ear: External ear normal.  Right facial droop noted.   Eyes: Conjunctivae and EOM are normal. Pupils are equal, round, and reactive to light.  Neck: No JVD present.  Cardiovascular: Normal rate and regular rhythm.  Exam reveals  no gallop.   No murmur heard. GI: Soft. Normal appearance. There is no tenderness.  Musculoskeletal: Normal range of motion.  Neurological: She is alert. A cranial nerve deficit and sensory deficit is present. GCS eye subscore is 4. GCS verbal subscore is 5. GCS motor subscore is 6. She displays no Babinski's sign on the right side. She displays no Babinski's sign on the left side.  Left face V3 area sensation is less than right side. Has right facial droop. Slurred speech. Patient is aphasic mostly except for "NO" and "OK". No pronator drift. Normal finger-to-nose, normal repetitive movement, normal heel to shin. 4/5 strength right upper and lower ext. 5/5 on left extremities.  Skin: She is not diaphoretic.  Psychiatric: Her  speech is slurred.  Tearful.     Lab results: Basic Metabolic Panel:  Recent Labs  03/05/14 1508  NA 140  K 4.1  CL 102  CO2 22  GLUCOSE 93  BUN 14  CREATININE 1.10  CALCIUM 9.2   Liver Function Tests:  Recent Labs  03/05/14 1508  AST 24  ALT 15  ALKPHOS 60  BILITOT 0.5  PROT 7.4  ALBUMIN 3.8   No results found for this basename: LIPASE, AMYLASE,  in the last 72 hours No results found for this basename: AMMONIA,  in the last 72 hours CBC:  Recent Labs  03/05/14 1332  WBC 9.8  NEUTROABS 7.5  HGB 14.9  HCT 43.6  MCV 98.4  PLT PLATELETS APPEAR DECREASED   Cardiac Enzymes:  Recent Labs  03/05/14 1725  TROPONINI 0.37*   BNP: No results found for this basename: PROBNP,  in the last 72 hours D-Dimer: No results found for this basename: DDIMER,  in the last 72 hours CBG:  Recent Labs  03/05/14 1426  GLUCAP 101*   Hemoglobin A1C: No results found for this basename: HGBA1C,  in the last 72 hours Fasting Lipid Panel: No results found for this basename: CHOL, HDL, LDLCALC, TRIG, CHOLHDL, LDLDIRECT,  in the last 72 hours Thyroid Function Tests: No results found for this basename: TSH, T4TOTAL, FREET4, T3FREE, THYROIDAB,  in the last 72 hours Anemia Panel: No results found for this basename: VITAMINB12, FOLATE, FERRITIN, TIBC, IRON, RETICCTPCT,  in the last 72 hours Coagulation:  Recent Labs  03/05/14 1508  LABPROT 13.9  INR 1.07   Urine Drug Screen: Drugs of Abuse     Component Value Date/Time   LABOPIA NEGATIVE 07/05/2011 0936   COCAINSCRNUR NEGATIVE 07/05/2011 0936   LABBENZ NEGATIVE 07/05/2011 0936   AMPHETMU NEGATIVE 07/05/2011 0936    Alcohol Level: No results found for this basename: ETH,  in the last 72 hours Urinalysis: No results found for this basename: COLORURINE, APPERANCEUR, LABSPEC, PHURINE, GLUCOSEU, HGBUR, BILIRUBINUR, KETONESUR, PROTEINUR, UROBILINOGEN, NITRITE, LEUKOCYTESUR,  in the last 72 hours  Imaging results:  Ct  Head (brain) Wo Contrast  03/05/2014   CLINICAL DATA:  Facial droop and dysarthria ; right-sided weakness  EXAM: CT HEAD WITHOUT CONTRAST  TECHNIQUE: Contiguous axial images were obtained from the base of the skull through the vertex without intravenous contrast.  COMPARISON:  None.  FINDINGS: The ventricles are normal in size and configuration. There is no demonstrable mass, hemorrhage, extra-axial fluid collection, or midline shift. There is decreased attenuation in the superior left temporal lobe with involvement of the left extreme capsule and insular cortex as well consistent with an acute infarct with focal cytotoxic edema in this area. This recent appearing infarct extends more superiorly to involve  a portion of the posterior left frontal lobe.  Bony calvarium appears intact. The mastoid air cells are clear. There is nasal turbinate edema bilaterally. There is also decreased attenuation in a portion of the medial left occipital lobe which appears more chronic and may represent an older infarct. Elsewhere gray-white compartments appear unremarkable.  IMPRESSION: Acute infarct involving portions of the superior left temporal lobe as well as portions of the posterior left frontal lobe. There is also involvement of the left extreme capsule and insular cortex. There is cytotoxic edema in these areas.  There is decreased attenuation in the medial left occipital lobe with sparing of the post rib medial aspect of the left occipital lobe. Suspect older infarct in this area. There is no acute hemorrhage. There is no mass or midline shift. There is diffuse nasal turbinate edema bilaterally.   Electronically Signed   By: Lowella Grip M.D.   On: 03/05/2014 14:29   Dg Chest Port 1 View  03/05/2014   CLINICAL DATA:  LEFT side stroke, weakness, slurred speech, past history MI, cervical cancer, hypertension  EXAM: PORTABLE CHEST - 1 VIEW  COMPARISON:  Portable exam 1455 hr compared to 07/03/2011  FINDINGS: Upper  normal heart size.  Normal mediastinal contours and pulmonary vascularity.  Slight rotation to the LEFT.  Lungs grossly clear.  No pleural effusion, pneumothorax or acute osseous findings.  IMPRESSION: No acute abnormalities.   Electronically Signed   By: Lavonia Dana M.D.   On: 03/05/2014 15:01    Other results: EKG: NSR, LAD, RBBB, Q wave II, III, AVF.    Assessment & Plan by Problem: Principal Problem:   Stroke Active Problems:   Elevation of cardiac enzymes   CKD (chronic kidney disease) stage 3, GFR 30-59 ml/min   Hypertension   NSTEMI (non-ST elevated myocardial infarction)   Ischemic Stroke -acute infarct of superior left temporal lobe and portions of posterior left frontal lobe + left extreme capsule and insular cortex. There is no midline shift. There is cytotoxic edema. No acute hemorrhage. - ASA 300 per rectum. - MRA and MRI brain  - doppler carotids - permissive HTN upto SBP 220/120. if higher then can treat with levatalol.  - neuro checks - PT and OT. - ECHO - hgba1c, lipid panel.  Elevated cardiac enzymes - 0.37 (lab) and 0.23 (POC) - repeat EKG. EKG showed some changes such as RBBB and Q waves II, III, AVF. - cycle trops - elevation could be 2/2 to demand ischemia in the setting of stroke and HTN.  Anion gap - without acidosis - Gap 16. Could be 2/2 to lactic acidosis given ischemia. Will check lactic acid tomorrow.   HTN -doing permissive HTN currently in the setting of stroke. - will address HTN after 24 hours. Uses norvasc at home.   CKD - stable now - not sure about baseline Crt, seems to be around 1.6. Currently 1.10 better than baseline - repeat BMP tomorrow.  Dispo: Disposition is deferred at this time, awaiting improvement of current medical problems. Anticipated discharge in approximately 2-3 day(s).   The patient does not have a current PCP (No primary provider on file.) and does need an [facility] hospital follow-up appointment after discharge.  The  patient does not know have transportation limitations that hinder transportation to clinic appointments.  Signed: Dellia Nims, MD 03/05/2014, 7:16 PM

## 2014-03-05 NOTE — H&P (Signed)
  I have seen and examined the patient myself, and I have reviewed the note by Claris Pong, MS III and was present during the interview and physical exam.  Please see my separate H&P for additional findings, assessment, and plan.   Signed: Dellia Nims, MD 03/05/2014, 8:05 PM

## 2014-03-05 NOTE — H&P (Signed)
Date: 03/05/2014               Patient Name:  Traci Mclaughlin MRN: [id number]  DOB: 1950-09-01 Age / Sex: 63 y.o., female   PCP: No primary provider on file.              Medical Service: Internal Medicine Teaching Service              Attending Physician: Dr. Carlyle Basques, MD    First Contact: Garret Reddish, MS 3 Pager: [phone]  Second Contact: Dr. Dellia Nims Pager: [phone]  Third Contact Dr. Adele Barthel Pager: [phone]       After Hours (After 5p/  First Contact Pager: [phone]  weekends / holidays): Second Contact Pager: [phone]   Chief Complaint: Stroke  History of Present Illness: Traci Mclaughlin is a 63 yo female with a history of hypertension, STEMI, and is Hard of Hearing, who presents with right sided facial droop, dysarthria, and right sided weakness for 2 days. She was in her normal state of health until two days ago when she noticed these symptoms, and presented to the ED where she was shown to have a left MCA infarction.  Meds: Current Facility-Administered Medications  Medication Dose Route Frequency Provider Last Rate Last Dose  . 0.9 %  sodium chloride infusion   Intravenous Continuous Francine Graven, DO 75 mL/hr at 03/05/14 1628    . 0.9 %  sodium chloride infusion   Intravenous STAT Francine Graven, DO        Allergies: Allergies as of 03/05/2014  . (No Known Allergies)   Past Medical History  Diagnosis Date  . Cancer     cervical; was on chemo; has been in remission for 82yrs  . STEMI (ST elevation myocardial infarction) 06/2011  . Acute kidney injury 07/06/2011  . Hypertension   . HOH (hard of hearing)    Past Surgical History  Procedure Laterality Date  . Cardiac catheterization  07/03/2011    Dr. Irish Lack   History reviewed. No pertinent family history. History   Social History  . Marital Status: Single    Spouse Name: N/A    Number of Children: N/A  . Years of Education: N/A   Occupational History  . Not on file.    Social History Main Topics  . Smoking status: Former Smoker -- 1.00 packs/day for 1 years    Types: Cigarettes  . Smokeless tobacco: Former Systems developer    Quit date: 06/16/2011  . Alcohol Use: Yes     Comment: occassional  . Drug Use: No  . Sexual Activity: Not on file   Other Topics Concern  . Not on file   Social History Narrative  . No narrative on file    Review of Systems: Limited by patient inability to talk or hear well No chest pain or abdominal pain  Vitals: Temp:  [98.3 F (36.8 C)-98.9 F (37.2 C)] 98.9 F (37.2 C) (08/10 1842) Pulse Rate:  [69-102] 86 (08/10 1842) Resp:  [13-29] 20 (08/10 1842) BP: (130-182)/(78-105) 156/78 mmHg (08/10 1842) SpO2:  [96 %-100 %] 98 % (08/10 1842)  Physical Exam: General: Tearful woman lying in bed in distress over not being able to talk  HEENT: Normocephalic, atraumatic, anicteric sclera, no conjunctivitis,  CV: RRR, nl Y6/A6, 2/6 systolic murmur, no rubs or gallops  Pulm: Normal work of breathing, no wheezes or crackles  GI: Soft, non tender, non distended, normoactive bowel sounds  MSK: Normal ROM in upper and lower extremities  bilaterally  Neuro: Alert and oriented x3, normal vision with glasses, EOMI, PERRLA,  loss of sensation in left V3 range, right facial droop that becomes more pronounced while smiling, very hard of hearing, slight corrective nystagmus in the right eye, dysarthria, 5/5 strength on head turn to the right, 4/5 strength on head turn to the left, tongue protudes without deviation and with normal motion side to side,   Normal sensation in bilateral upper and lower extremities, 5/5 right side upper and lower extremity strength, 4/5 left sided upper and lower extremity strength,   2+ biceps, brachioradialis, and left patellar reflex, could not elicit right patellar reflex  Toes downgoing bilaterally, negative palmar raise test, normal finger nose finger test   Lab results: Basic Metabolic  Panel:  Recent Labs  03/05/14 1508  NA 140  K 4.1  CL 102  CO2 22  GLUCOSE 93  BUN 14  CREATININE 1.10  CALCIUM 9.2   Liver Function Tests:  Recent Labs  03/05/14 1508  AST 24  ALT 15  ALKPHOS 60  BILITOT 0.5  PROT 7.4  ALBUMIN 3.8   CBC:  Recent Labs  03/05/14 1332  WBC 9.8  NEUTROABS 7.5  HGB 14.9  HCT 43.6  MCV 98.4  PLT PLATELETS APPEAR DECREASED   Cardiac Enzymes:  Recent Labs  03/05/14 1725  TROPONINI 0.37*   CBG:  Recent Labs  03/05/14 1426  GLUCAP 101*  Coagulation:  Recent Labs  03/05/14 1508  LABPROT 13.9  INR 1.07   Urine Drug Screen: Drugs of Abuse     Component Value Date/Time   LABOPIA NEGATIVE 07/05/2011 0936   COCAINSCRNUR NEGATIVE 07/05/2011 0936   LABBENZ NEGATIVE 07/05/2011 0936   AMPHETMU NEGATIVE 07/05/2011 0936    Imaging results:  Ct Head (brain) Wo Contrast: 03/05/2014 Acute infarct involving portions of the superior left temporal lobe as well as portions of the posterior left frontal lobe. There is also involvement of the left extreme capsule and insular cortex. There is cytotoxic edema in these areas.  There is decreased attenuation in the medial left occipital lobe with sparing of the post rib medial aspect of the left occipital lobe. Suspect older infarct in this area. There is no acute hemorrhage. There is no mass or midline shift. There is diffuse nasal turbinate edema bilaterally.   Dg Chest Port 1 View 03/05/2014   Upper normal heart size.  Normal mediastinal contours and pulmonary vascularity.  Slight rotation to the LEFT.  Lungs grossly clear.  No pleural effusion, pneumothorax or acute osseous findings.    No acute abnormalities.      Other results: EKG: Normal sinus rhythm, left axis deviation, RBBB,  inferior infarct, age undetermined, possibly new anterolateral infarct since 2012, which was otherwise unchanged  Assessment & Plan by Problem: Principal problem:   Left MCA Infarction Active problems:    NSTEMI   CKD   Hypertension  Left MCA infarction Superior left temporal lobe and posterior left frontal lobe stroke, with involvement of the left extreme capsule and insular cortex. There is no acute hemorrhage.  -- Aspirin rectal -- Lipid panel -- A1c -- Carotid duplex -- Echo -- MRI/MRA -- EKG in the AM -- PT/Speech therapy  Elevation of cardiac enzymes Troponin of 0.23 --> 0.37. Denies chest pain. EKG shows old RBBB and left axis deviation, not that different from past EKG.  -- Aspirin as above -- Cycle trops -- AM EKG -- Echo  CKD GFR 61, BUN/Cr 12.7 meaning kidney disease  not due to prerenal azotemia -- Morning BMP  Hypertension In the setting of stroke, allow her to be hypertensive and focus on treating the underlying pathology before instituting treatment.   This is a Careers information officer Note.  The care of the patient was discussed with Dr. Dellia Nims and the assessment and plan was formulated with their assistance.  Please see their note for official documentation of the patient encounter.   Signed: Tommie Sams, Med Student 03/05/2014, 6:44 PM

## 2014-03-05 NOTE — Consult Note (Signed)
Neurology Consultation Reason for Consult: Stroke Referring Physician: Towanda Malkin.  CC: Aphasia  History is obtained from: Patient, daughter  HPI: Traci Mclaughlin is a 63 y.o. female he was in her normal state of health up until yesterday when she began noticing right-sided weakness, slurred speech, difficulty speaking. Today, her daughter and asked her to come to the emergency room where was found she has a significant left MCA infarction.   LKW: 8/9 tpa given?: no, outside of window    ROS:  Unable to obtain due to altered mental status.   Past Medical History  Diagnosis Date  . Cancer     cervical; was on chemo; has been in remission for 57yrs  . STEMI (ST elevation myocardial infarction) 06/2011  . Acute kidney injury 07/06/2011  . Hypertension   . HOH (hard of hearing)     Family History: Daughter-ICH during childbirth  Social History: Tob: Denies  Exam: Current vital signs: BP 156/78  Pulse 86  Temp(Src) 98.9 F (37.2 C) (Oral)  Resp 20  SpO2 98% Vital signs in last 24 hours: Temp:  [98.3 F (36.8 C)-98.9 F (37.2 C)] 98.9 F (37.2 C) (08/10 1842) Pulse Rate:  [69-102] 86 (08/10 1842) Resp:  [13-29] 20 (08/10 1842) BP: (130-182)/(78-105) 156/78 mmHg (08/10 1842) SpO2:  [96 %-100 %] 98 % (08/10 1842)  General: In bed, NAD CV: Regular in rhythm Mental Status: Patient is awake, alert, she is unable to answer questions on orientation. She does follow commands No signs of  Neglect. She has a significant expressive aphasia. She also has some difficulty with following complex commands. Cranial Nerves: II:  Blinks to threat bilaterally. Pupils are equal, round, and reactive to light.  Discs are difficult to visualize. III,IV, VI: EOMI without ptosis or diploplia.  V: Facial sensation is decreased on right VII: Facial movement is notable for right facial droop VIII: hearing is intact to voice X: Uvula elevates symmetrically XI: Shoulder shrug is  symmetric. XII: tongue is midline without atrophy or fasciculations.  Motor: Tone is normal. Bulk is normal. 5/5 strength was present on the left, 4/5 strength in the right arm and leg Sensory: Sensation is decreased throughout the right side Deep Tendon Reflexes: 2+ and symmetric in the biceps and patellae.  Cerebellar: FNF intact bilaterally Gait: Not tested secondary to patient safety concerns    I have reviewed labs in epic and the results pertinent to this consultation are: Mildly elevated troponin CMP-unremarkable  I have reviewed the images obtained: CT head-left MCA distribution infarct  Impression: 63 year old female with left MCA distribution infarct of unclear etiology. She is being admitted for stroke workup.  Recommendations: 1. HgbA1c, fasting lipid panel 2. MRI, MRA  of the brain without contrast 3. Frequent neuro checks 4. Echocardiogram 5. Carotid dopplers 6. Prophylactic therapy-Antiplatelet med: Aspirin - dose 325mg  PO or 300mg  PR 7. Risk factor modification 8. Telemetry monitoring 9. PT consult, OT consult, Speech consult   Roland Rack, MD [facility] [phone]  If 7pm- 7am, please page neurology on call as listed in [place].

## 2014-03-05 NOTE — Consult Note (Addendum)
CONSULT NOTE  Date: 03/05/2014               Patient Name:  Traci Mclaughlin MRN: [id number]  DOB: [birth year] Age / Sex: 63 y.o., female        PCP: No primary provider on file. Primary Cardiologist: Irish Lack            Referring Physician: Baxter Flattery              Reason for Consult: Elevated Troponin           History of Present Illness: Patient is a 63 y.o. female with a PMHx of elevated cardiac enzymes in the past with normal cardiac catheterization, acute kidney injury, who was admitted to [facility] on 03/05/2014 for evaluation of  stroke.   She was noted to have an elevated point-of-care troponin level and we were called to consult.  The patient is unable to give any history. She is completely aphasic. She was able to nod and shake her head. She denies any chest pain at present. She denies any shortness of breath.  Chest CT scan of the head today which reveals an acute infarct involving the superior left temporal lobe as well as portions of the posterior left frontal lobe. Is also involvement of the left extreme capsule and insular cortex.  EKG reveals normal sinus rhythm and right bundle branch block.  In 2012, she was admitted with 3 days of CP  And elevated troponin levels up to 6.9.  She had complained of severe N / V.   She had a cardiac catheterization by Dr. Irish Lack which revealed smooth and normal coronary arteries.  Medications: Outpatient medications:  (Not in a hospital admission)  Current medications: Current Facility-Administered Medications  Medication Dose Route Frequency Provider Last Rate Last Dose  . 0.9 %  sodium chloride infusion   Intravenous Continuous Francine Graven, DO 75 mL/hr at 03/05/14 1628     Current Outpatient Prescriptions  Medication Sig Dispense Refill  . amLODipine (NORVASC) 5 MG tablet Take 5 mg by mouth daily.      . diclofenac sodium (VOLTAREN) 1 % GEL Apply 4 g topically 4 (four) times daily.      Marland Kitchen HYDROcodone-acetaminophen  (NORCO/VICODIN) 5-325 MG per tablet Take 1 tablet by mouth every 6 (six) hours as needed for moderate pain.         No Known Allergies   Past Medical History  Diagnosis Date  . Cancer     cervical; was on chemo; has been in remission for 22yrs  . STEMI (ST elevation myocardial infarction) 06/2011  . Acute kidney injury 07/06/2011  . Hypertension   . HOH (hard of hearing)     Past Surgical History  Procedure Laterality Date  . Cardiac catheterization  07/03/2011    Dr. Irish Lack    History reviewed. No pertinent family history.  Social History:  reports that she has quit smoking. Her smoking use included Cigarettes. She has a 1 pack-year smoking history. She quit smokeless tobacco use about 2 years ago. She reports that she drinks alcohol. She reports that she does not use illicit drugs.   Review of Systems: She was unable to provide any review of systems because of her severe stroke.  Physical Exam: BP 160/105  Pulse 93  Temp(Src) 98.3 F (36.8 C) (Oral)  Resp 21  SpO2 99%  Wt Readings from Last 3 Encounters:  02/17/14 200 lb (90.719 kg)  07/06/11 233 lb 14.5 oz (106.1  kg)  07/06/11 233 lb 14.5 oz (106.1 kg)    General: Vital signs reviewed and noted. Well-developed, well-nourished, in no acute distress; alert,   Head: Normocephalic, atraumatic, sclera anicteric,   Neck: Supple. Negative for carotid bruits. No JVD   Lungs:  Clear bilaterally, no  wheezes, rales, or rhonchi. Breathing is normal   Heart: RRR with S1 S2. No murmurs, rubs, or gallops   Abdomen:  Soft, non-tender, non-distended with normoactive bowel sounds. No hepatomegaly. No rebound/guarding. No obvious abdominal masses   MSK: Strength and the appear normal for age.   Extremities: No clubbing or cyanosis. No edema.  Distal pedal pulses are 2+ and equal   Neurologic: Alert.  Was able to nod and shake her head.   Psych: na    Lab results: Basic Metabolic Panel:  Recent Labs Lab 03/05/14 1508  NA  140  K 4.1  CL 102  CO2 22  GLUCOSE 93  BUN 14  CREATININE 1.10  CALCIUM 9.2    Liver Function Tests:  Recent Labs Lab 03/05/14 1508  AST 24  ALT 15  ALKPHOS 60  BILITOT 0.5  PROT 7.4  ALBUMIN 3.8   No results found for this basename: LIPASE, AMYLASE,  in the last 168 hours No results found for this basename: AMMONIA,  in the last 168 hours  CBC:  Recent Labs Lab 03/05/14 1332  WBC 9.8  NEUTROABS 7.5  HGB 14.9  HCT 43.6  MCV 98.4  PLT PLATELETS APPEAR DECREASED    Cardiac Enzymes: No results found for this basename: CKTOTAL, CKMB, CKMBINDEX, TROPONINI,  in the last 168 hours  BNP: No components found with this basename: POCBNP,   CBG:  Recent Labs Lab 03/05/14 1426  GLUCAP 101*    Coagulation Studies:  Recent Labs  03/05/14 1508  LABPROT 13.9  INR 1.07     Other results: EKG :  Normal sinus rhythm.  Right bundle branch block  Imaging: Ct Head (brain) Wo Contrast  03/05/2014   CLINICAL DATA:  Facial droop and dysarthria ; right-sided weakness  EXAM: CT HEAD WITHOUT CONTRAST  TECHNIQUE: Contiguous axial images were obtained from the base of the skull through the vertex without intravenous contrast.  COMPARISON:  None.  FINDINGS: The ventricles are normal in size and configuration. There is no demonstrable mass, hemorrhage, extra-axial fluid collection, or midline shift. There is decreased attenuation in the superior left temporal lobe with involvement of the left extreme capsule and insular cortex as well consistent with an acute infarct with focal cytotoxic edema in this area. This recent appearing infarct extends more superiorly to involve a portion of the posterior left frontal lobe.  Bony calvarium appears intact. The mastoid air cells are clear. There is nasal turbinate edema bilaterally. There is also decreased attenuation in a portion of the medial left occipital lobe which appears more chronic and may represent an older infarct. Elsewhere  gray-white compartments appear unremarkable.  IMPRESSION: Acute infarct involving portions of the superior left temporal lobe as well as portions of the posterior left frontal lobe. There is also involvement of the left extreme capsule and insular cortex. There is cytotoxic edema in these areas.  There is decreased attenuation in the medial left occipital lobe with sparing of the post rib medial aspect of the left occipital lobe. Suspect older infarct in this area. There is no acute hemorrhage. There is no mass or midline shift. There is diffuse nasal turbinate edema bilaterally.   Electronically Signed  By: Lowella Grip M.D.   On: 03/05/2014 14:29   Dg Chest Port 1 View  03/05/2014   CLINICAL DATA:  LEFT side stroke, weakness, slurred speech, past history MI, cervical cancer, hypertension  EXAM: PORTABLE CHEST - 1 VIEW  COMPARISON:  Portable exam 1455 hr compared to 07/03/2011  FINDINGS: Upper normal heart size.  Normal mediastinal contours and pulmonary vascularity.  Slight rotation to the LEFT.  Lungs grossly clear.  No pleural effusion, pneumothorax or acute osseous findings.  IMPRESSION: No acute abnormalities.   Electronically Signed   By: Lavonia Dana M.D.   On: 03/05/2014 15:01      Last Echo 07/03/11 Left ventricle: Hyperdynamic LV function creating mild LV outflow tract gradient. The cavity size was normal. Wall thickness was increased in a pattern of moderate LVH. Systolic function was normal. The estimated ejection fraction was 80%. There was dynamic obstruction, with a peak velocity of 303cm/sec and a peak gradient of 70mm Hg. Wall motion was normal; there were no regional wall motion abnormalities. - Mitral valve: Calcified annulus. Mildly thickened leaflets . Mild regurgitation. - Pericardium, extracardiac: A trivial pericardial effusion was identified      Last Cath 07/03/11 IMPRESSIONS:  1. Normal left main coronary artery. 2. Normal left anterior descending artery  and its branches. 3. Normal left circumflex artery and its branches. 4. Normal right coronary artery. 5. LVEDP 8 mmHg. Ejection fraction was not assessed due to the patient's increased creatinine.       Assessment & Plan: 1. Elevated troponin level: The patient presents with a very severe stroke and is aphasic and is unable to move. She was incidentally found to have an elevated troponin level.   We need to verify that the troponin level is actually elevated. I find that the point-of-care troponin levels are frequently elevated and the confirmatory troponin levels come back negative.  At this time we need to see how she improves from a stroke standpoint. She has normal coronary arteries as of 2012.   I do not anticipate any additional cardiac workup at this time.  She'll need to  make significant improvement from a stroke standpoint before she would be a candidate for invasive cardiac procedures.   Thayer Headings, Brooke Bonito., MD, [facility] 03/05/2014, 5:20 PM Office - [phone] Pager [phone]

## 2014-03-05 NOTE — ED Notes (Signed)
Pt continues to be monitored by 5 lead, blood pressure, and pulse ox.  

## 2014-03-05 NOTE — ED Notes (Signed)
Pt placed on monitor upon arrival to room from CT. Pt monitored by 5 lead, blood pressure, and pulse ox. Pts CBG 101 reported to nurse.

## 2014-03-05 NOTE — ED Provider Notes (Signed)
CSN: [id number]     Arrival date & time 03/05/14  1324 History   First MD Initiated Contact with Patient 03/05/14 1337     Chief Complaint  Patient presents with  . Stroke Symptoms      The history is provided by a relative, a caregiver and the patient. The history is limited by the condition of the patient (Aphasic).  Pt was seen at 1340. Per pt's family, c/o pt with gradual onset and persistence of constant right sided facial droop and slurred speech since yesterday. Pt's family also states pt has been c/o LLE "pain" for the past 1 month, with extensive ED and UCC evaluations with definitive diagnosis. Pt's family states yesterday pt "started not to act right." Family states she "was more tearful than usual," her "speech was slurred" and her "right face was drooping." Family states pt was "drooling out of the corner of her mouth." Pt's family could not convince pt to come to the ED yesterday for evaluation. When pt's symptoms continued today, family convinced pt to come to the ED for evaluation. No reported falls, no fevers, no vomiting/diarrhea, no choking, no SOB/cough.     Past Medical History  Diagnosis Date  . Cancer     cervical; was on chemo; has been in remission for 72yrs  . STEMI (ST elevation myocardial infarction) 06/2011  . Acute kidney injury 07/06/2011  . Hypertension   . HOH (hard of hearing)    Past Surgical History  Procedure Laterality Date  . Cardiac catheterization  07/03/2011    Dr. Irish Lack    History  Substance Use Topics  . Smoking status: Former Smoker -- 1.00 packs/day for 1 years    Types: Cigarettes  . Smokeless tobacco: Former Systems developer    Quit date: 06/16/2011  . Alcohol Use: Yes     Comment: occassional    Review of Systems  Unable to perform ROS: Patient nonverbal      Allergies  Review of patient's allergies indicates no known allergies.  Home Medications   Prior to Admission medications   Medication Sig Start Date End Date Taking?  Authorizing Provider  amLODipine (NORVASC) 5 MG tablet Take 5 mg by mouth daily.   Yes Historical Provider, MD  diclofenac sodium (VOLTAREN) 1 % GEL Apply 4 g topically 4 (four) times daily.   Yes Historical Provider, MD  HYDROcodone-acetaminophen (NORCO/VICODIN) 5-325 MG per tablet Take 1 tablet by mouth every 6 (six) hours as needed for moderate pain.   Yes Historical Provider, MD   BP 172/98  Pulse 90  Temp(Src) 98.3 F (36.8 C) (Oral)  Resp 17  SpO2 98% Physical Exam 1345: Physical examination:  Nursing notes reviewed; Vital signs and O2 SAT reviewed;  Constitutional: Well developed, Well nourished, In no acute distress; Head:  Normocephalic, atraumatic; Eyes: EOMI, PERRL, No scleral icterus; ENMT: Mouth and pharynx normal, Mucous membranes dry; Neck: Supple, Full range of motion, No lymphadenopathy; Cardiovascular: Regular rate and rhythm, No gallop; Respiratory: Breath sounds clear & equal bilaterally, No wheezes.  Speaking full sentences with ease, Normal respiratory effort/excursion; Chest: Nontender, Movement normal; Abdomen: Soft, Nontender, Nondistended, Normal bowel sounds; Genitourinary: No CVA tenderness; Extremities: Pulses normal, No tenderness, No edema, No calf edema or asymmetry.; Neuro: Awake, alert. +HOH per baseline. Right facial droop. Speech slurred, word searching. Right grip weaker than left grip. LUE and LLE 5/5 strength. RUE and RLE 4/5 strength. RLE with +drift. Appears not to fully comprehend cerebellar (finger-nose/heel-shin) testing..; Skin: Color normal, Warm, Dry.;  Psych:  Tearful.     ED Course  Procedures    EKG Interpretation   Date/Time:  Monday March 05 2014 13:30:20 EDT Ventricular Rate:  97 PR Interval:  116 QRS Duration: 138 QT Interval:  404 QTC Calculation: 513 R Axis:   -66 Text Interpretation:  Normal sinus rhythm Left axis deviation Right bundle  branch block Inferior infarct , age undetermined Anterolateral infarct ,  age undetermined  Baseline wander Abnormal ECG When compared with ECG of  07/05/2011 No significant change was found Confirmed by [facility]  MD,  Nunzio Cory [phone]) on 03/05/2014 1:38:47 PM      MDM  MDM Reviewed: previous chart, nursing note and vitals Reviewed previous: labs and ECG Interpretation: labs, ECG, CT scan and x-ray Total time providing critical care: 30-74 minutes. This excludes time spent performing separately reportable procedures and services. Consults: neurology, cardiology and admitting MD   CRITICAL CARE Performed by: Alfonzo Feller Total critical care time: 40 Critical care time was exclusive of separately billable procedures and treating other patients. Critical care was necessary to treat or prevent imminent or life-threatening deterioration. Critical care was time spent personally by me on the following activities: development of treatment plan with patient and/or surrogate as well as nursing, discussions with consultants, evaluation of patient's response to treatment, examination of patient, obtaining history from patient or surrogate, ordering and performing treatments and interventions, ordering and review of laboratory studies, ordering and review of radiographic studies, pulse oximetry and re-evaluation of patient's condition.   Results for orders placed during the hospital encounter of 03/05/14  PROTIME-INR      Result Value Ref Range   Prothrombin Time 13.9  11.6 - 15.2 seconds   INR 1.07  0.00 - 1.49  APTT      Result Value Ref Range   aPTT 40 (*) 24 - 37 seconds  CBC      Result Value Ref Range   WBC 9.8  4.0 - 10.5 K/uL   RBC 4.43  3.87 - 5.11 MIL/uL   Hemoglobin 14.9  12.0 - 15.0 g/dL   HCT 43.6  36.0 - 46.0 %   MCV 98.4  78.0 - 100.0 fL   MCH 33.6  26.0 - 34.0 pg   MCHC 34.2  30.0 - 36.0 g/dL   RDW 12.8  11.5 - 15.5 %   Platelets PLATELETS APPEAR DECREASED  150 - 400 K/uL  DIFFERENTIAL      Result Value Ref Range   Neutrophils Relative % 77  43 - 77 %    Lymphocytes Relative 15  12 - 46 %   Monocytes Relative 7  3 - 12 %   Eosinophils Relative 1  0 - 5 %   Basophils Relative 0  0 - 1 %   Neutro Abs 7.5  1.7 - 7.7 K/uL   Lymphs Abs 1.5  0.7 - 4.0 K/uL   Monocytes Absolute 0.7  0.1 - 1.0 K/uL   Eosinophils Absolute 0.1  0.0 - 0.7 K/uL   Basophils Absolute 0.0  0.0 - 0.1 K/uL   Smear Review       Value: PLATELET CLUMPS NOTED ON SMEAR, COUNT APPEARS DECREASED  COMPREHENSIVE METABOLIC PANEL      Result Value Ref Range   Sodium 140  137 - 147 mEq/L   Potassium 4.1  3.7 - 5.3 mEq/L   Chloride 102  96 - 112 mEq/L   CO2 22  19 - 32 mEq/L   Glucose, Bld 93  70 - 99 mg/dL   BUN 14  6 - 23 mg/dL   Creatinine, Ser 1.10  0.50 - 1.10 mg/dL   Calcium 9.2  8.4 - 10.5 mg/dL   Total Protein 7.4  6.0 - 8.3 g/dL   Albumin 3.8  3.5 - 5.2 g/dL   AST 24  0 - 37 U/L   ALT 15  0 - 35 U/L   Alkaline Phosphatase 60  39 - 117 U/L   Total Bilirubin 0.5  0.3 - 1.2 mg/dL   GFR calc non Af Amer 52 (*) >90 mL/min   GFR calc Af Amer 61 (*) >90 mL/min   Anion gap 16 (*) 5 - 15  CBG MONITORING, ED      Result Value Ref Range   Glucose-Capillary 101 (*) 70 - 99 mg/dL  I-STAT TROPOININ, ED      Result Value Ref Range   Troponin i, poc 0.23 (*) 0.00 - 0.08 ng/mL   Comment NOTIFIED PHYSICIAN     Comment 3            Ct Head (brain) Wo Contrast 03/05/2014   CLINICAL DATA:  Facial droop and dysarthria ; right-sided weakness  EXAM: CT HEAD WITHOUT CONTRAST  TECHNIQUE: Contiguous axial images were obtained from the base of the skull through the vertex without intravenous contrast.  COMPARISON:  None.  FINDINGS: The ventricles are normal in size and configuration. There is no demonstrable mass, hemorrhage, extra-axial fluid collection, or midline shift. There is decreased attenuation in the superior left temporal lobe with involvement of the left extreme capsule and insular cortex as well consistent with an acute infarct with focal cytotoxic edema in this area. This recent  appearing infarct extends more superiorly to involve a portion of the posterior left frontal lobe.  Bony calvarium appears intact. The mastoid air cells are clear. There is nasal turbinate edema bilaterally. There is also decreased attenuation in a portion of the medial left occipital lobe which appears more chronic and may represent an older infarct. Elsewhere gray-white compartments appear unremarkable.  IMPRESSION: Acute infarct involving portions of the superior left temporal lobe as well as portions of the posterior left frontal lobe. There is also involvement of the left extreme capsule and insular cortex. There is cytotoxic edema in these areas.  There is decreased attenuation in the medial left occipital lobe with sparing of the post rib medial aspect of the left occipital lobe. Suspect older infarct in this area. There is no acute hemorrhage. There is no mass or midline shift. There is diffuse nasal turbinate edema bilaterally.   Electronically Signed   By: Lowella Grip M.D.   On: 03/05/2014 14:29   US Venous Img Lower Unilateral Left 02/17/2014   CLINICAL DATA:  Left leg pain  EXAM: LEFT LOWER EXTREMITY VENOUS DOPPLER ULTRASOUND  TECHNIQUE: Gray-scale sonography with graded compression, as well as color Doppler and duplex ultrasound were performed to evaluate the lower extremity deep venous systems from the level of the common femoral vein and including the common femoral, femoral, profunda femoral, popliteal and calf veins including the posterior tibial, peroneal and gastrocnemius veins when visible. The superficial great saphenous vein was also interrogated. Spectral Doppler was utilized to evaluate flow at rest and with distal augmentation maneuvers in the common femoral, femoral and popliteal veins.  COMPARISON:  None.  FINDINGS: Common Femoral Vein: No evidence of thrombus. Normal compressibility, respiratory phasicity and response to augmentation.  Saphenofemoral Junction: No evidence of  thrombus. Normal  compressibility and flow on color Doppler imaging.  Profunda Femoral Vein: No evidence of thrombus. Normal compressibility and flow on color Doppler imaging.  Femoral Vein: No evidence of thrombus. Normal compressibility, respiratory phasicity and response to augmentation.  Popliteal Vein: No evidence of thrombus. Normal compressibility, respiratory phasicity and response to augmentation.  Calf Veins: No evidence of thrombus. Normal compressibility and flow on color Doppler imaging.  Superficial Great Saphenous Vein: No evidence of thrombus. Normal compressibility and flow on color Doppler imaging.  Venous Reflux:  None.  Other Findings:  None.  IMPRESSION: No evidence of deep venous thrombosis.   Electronically Signed   By: Daryll Brod M.D.   On: 02/17/2014 13:49   Dg Chest Port 1 View 03/05/2014   CLINICAL DATA:  LEFT side stroke, weakness, slurred speech, past history MI, cervical cancer, hypertension  EXAM: PORTABLE CHEST - 1 VIEW  COMPARISON:  Portable exam 1455 hr compared to 07/03/2011  FINDINGS: Upper normal heart size.  Normal mediastinal contours and pulmonary vascularity.  Slight rotation to the LEFT.  Lungs grossly clear.  No pleural effusion, pneumothorax or acute osseous findings.  IMPRESSION: No acute abnormalities.   Electronically Signed   By: Lavonia Dana M.D.   On: 03/05/2014 15:01    1435:  CT-H with acute CVA. Pt did not pass bedside swallow testing, will remain NPO. NIH score 10. Pt is not code stroke nor TPA candidate due to delayed presentation. Dx and testing d/w pt and family.  Questions answered.  Verb understanding, agreeable to admit. T/C to Neuro Dr. Leonel Ramsay, case discussed, including:  HPI, pertinent PM/SHx, VS/PE, dx testing, ED course and treatment:  Agreeable to consult, requests to admit to medical service.    1615:  Troponin elevated. Will hold heparin at this time due to acute CVA. T/C to Cardiology, case discussed, including:  HPI, pertinent PM/SHx,  VS/PE, dx testing, ED course and treatment:  Agreeable to consult.   1645: T/C to [facility] Resident, case discussed, including:  HPI, pertinent PM/SHx, VS/PE, dx testing, ED course and treatment:  Agreeable to admit, requests to write temporary orders, obtain tele bed to Dr. Storm Frisk service.     Francine Graven, DO 03/08/14 [phone]

## 2014-03-05 NOTE — ED Notes (Signed)
Pt placed into gown and on monitor upon arrival to room. Pt monitored by 5 lead, blood pressure, and pulse ox.  

## 2014-03-05 NOTE — Progress Notes (Signed)
Unable to complete pt's admission at this time; pt is unable to speak due to cva. Pt is very emotional about cva will continue to provide emotional support.

## 2014-03-05 NOTE — ED Notes (Signed)
Pt from home c/o difficulty speaking, facial droop and right sided weakness x 2 days; pt sts issues with htn recently; pt tearful and difficult to assess

## 2014-03-06 DIAGNOSIS — I214 Non-ST elevation (NSTEMI) myocardial infarction: Secondary | ICD-10-CM

## 2014-03-06 DIAGNOSIS — I129 Hypertensive chronic kidney disease with stage 1 through stage 4 chronic kidney disease, or unspecified chronic kidney disease: Secondary | ICD-10-CM

## 2014-03-06 DIAGNOSIS — I10 Essential (primary) hypertension: Secondary | ICD-10-CM

## 2014-03-06 DIAGNOSIS — N183 Chronic kidney disease, stage 3 unspecified: Secondary | ICD-10-CM

## 2014-03-06 DIAGNOSIS — I517 Cardiomegaly: Secondary | ICD-10-CM

## 2014-03-06 LAB — TROPONIN I: Troponin I: 0.43 ng/mL (ref <0.30)

## 2014-03-06 LAB — HIV ANTIBODY (ROUTINE TESTING W REFLEX): HIV 1&2 Ab, 4th Generation: NONREACTIVE

## 2014-03-06 LAB — RAPID URINE DRUG SCREEN, HOSP PERFORMED
Amphetamines: NOT DETECTED
BARBITURATES: NOT DETECTED
BENZODIAZEPINES: NOT DETECTED
COCAINE: NOT DETECTED
Opiates: NOT DETECTED
TETRAHYDROCANNABINOL: POSITIVE — AB

## 2014-03-06 LAB — HEMOGLOBIN A1C
Hgb A1c MFr Bld: 5.8 % — ABNORMAL HIGH (ref <5.7)
MEAN PLASMA GLUCOSE: 120 mg/dL — AB (ref <117)

## 2014-03-06 MED ORDER — ATORVASTATIN CALCIUM 40 MG PO TABS
40.0000 mg | ORAL_TABLET | Freq: Every day | ORAL | Status: DC
  Administered 2014-03-06 – 2014-03-11 (×6): 40 mg via ORAL
  Filled 2014-03-06 (×7): qty 1

## 2014-03-06 NOTE — Progress Notes (Signed)
Paged on call about BP 130/107. No new orders at this time. Will continue to monitor.

## 2014-03-06 NOTE — Progress Notes (Signed)
Echocardiogram 2D Echocardiogram has been performed.  Traci Mclaughlin 03/06/2014, 12:00 PM

## 2014-03-06 NOTE — Progress Notes (Signed)
Subjective:  Patient is more calm today. Not as tearful. Able to understand speech better today. Still remains aphasic.   Objective: Vital signs in last 24 hours: Filed Vitals:   03/06/14 0300 03/06/14 0600 03/06/14 0927 03/06/14 1307  BP: 141/77 149/92 138/97 142/82  Pulse: 100 91 89 85  Temp: 98.6 F (37 C) 98.1 F (36.7 C) 98.8 F (37.1 C) 98.8 F (37.1 C)  TempSrc: Oral Oral Oral Oral  Resp: 20 20 20 20   SpO2: 95% 98% 96% 93%   Weight change:  No intake or output data in the 24 hours ending 03/06/14 1603  Vitals reviewed. General:lying in bed, appears to have sad mood. Is able to understand our speech better today. HEENT: PERRL, EOMI, no scleral icterus. Is not able to puff cheek. Has some right facial droop present. Cannot turn head to the right.  Cardiac: RRR, no rubs, murmurs or gallops Pulm: clear to auscultation bilaterally, no wheezes, rales, or rhonchi Abd: soft, nontender, nondistended, BS present Ext: warm and well perfused, no pedal edema Neuro: alert to self, couldn't assess full orientation. Has right facial droop.Slurred speech. Patient is aphasic mostly except for "NO" and "OK". No pronator drift. 4/5 strength right upper and lower ext. 5/5 on left extremities. Sensation intact.right sided XII nerve deficit. Facial droop on right side.   Lab Results: Basic Metabolic Panel:  Recent Labs Lab 03/05/14 1508  NA 140  K 4.1  CL 102  CO2 22  GLUCOSE 93  BUN 14  CREATININE 1.10  CALCIUM 9.2   Liver Function Tests:  Recent Labs Lab 03/05/14 1508  AST 24  ALT 15  ALKPHOS 60  BILITOT 0.5  PROT 7.4  ALBUMIN 3.8   CBC:  Recent Labs Lab 03/05/14 1332  WBC 9.8  NEUTROABS 7.5  HGB 14.9  HCT 43.6  MCV 98.4  PLT PLATELETS APPEAR DECREASED   Cardiac Enzymes:  Recent Labs Lab 03/05/14 1725 03/05/14 2245 03/06/14 0510  TROPONINI 0.37* 0.57* 0.43*   BNP: No results found for this basename: PROBNP,  in the last 168 hours D-Dimer: No  results found for this basename: DDIMER,  in the last 168 hours CBG:  Recent Labs Lab 03/05/14 1426  GLUCAP 101*   Hemoglobin A1C:  Recent Labs Lab 03/05/14 2245  HGBA1C 5.8*   Fasting Lipid Panel:  Recent Labs Lab 03/05/14 2240  CHOL 191  HDL 48  LDLCALC 122*  TRIG 106  CHOLHDL 4.0   Urine Drug Screen: Drugs of Abuse     Component Value Date/Time   LABOPIA NONE DETECTED 03/05/2014 2237   LABOPIA NEGATIVE 07/05/2011 0936   COCAINSCRNUR NONE DETECTED 03/05/2014 2237   COCAINSCRNUR NEGATIVE 07/05/2011 0936   LABBENZ NONE DETECTED 03/05/2014 2237   LABBENZ NEGATIVE 07/05/2011 0936   AMPHETMU NONE DETECTED 03/05/2014 2237   AMPHETMU NEGATIVE 07/05/2011 0936   THCU POSITIVE* 03/05/2014 2237   LABBARB NONE DETECTED 03/05/2014 2237    Alcohol Level: No results found for this basename: ETH,  in the last 168 hours Urinalysis:  Recent Labs Lab 03/05/14 2237  COLORURINE YELLOW  LABSPEC 1.010  PHURINE 7.5  GLUCOSEU NEGATIVE  HGBUR NEGATIVE  BILIRUBINUR NEGATIVE  KETONESUR 15*  PROTEINUR NEGATIVE  UROBILINOGEN 1.0  NITRITE NEGATIVE  LEUKOCYTESUR NEGATIVE  Studies/Results: Ct Head (brain) Wo Contrast  03/05/2014   CLINICAL DATA:  Facial droop and dysarthria ; right-sided weakness  EXAM: CT HEAD WITHOUT CONTRAST  TECHNIQUE: Contiguous axial images were obtained from the base of the skull  through the vertex without intravenous contrast.  COMPARISON:  None.  FINDINGS: The ventricles are normal in size and configuration. There is no demonstrable mass, hemorrhage, extra-axial fluid collection, or midline shift. There is decreased attenuation in the superior left temporal lobe with involvement of the left extreme capsule and insular cortex as well consistent with an acute infarct with focal cytotoxic edema in this area. This recent appearing infarct extends more superiorly to involve a portion of the posterior left frontal lobe.  Bony calvarium appears intact. The mastoid air cells are  clear. There is nasal turbinate edema bilaterally. There is also decreased attenuation in a portion of the medial left occipital lobe which appears more chronic and may represent an older infarct. Elsewhere gray-white compartments appear unremarkable.  IMPRESSION: Acute infarct involving portions of the superior left temporal lobe as well as portions of the posterior left frontal lobe. There is also involvement of the left extreme capsule and insular cortex. There is cytotoxic edema in these areas.  There is decreased attenuation in the medial left occipital lobe with sparing of the post rib medial aspect of the left occipital lobe. Suspect older infarct in this area. There is no acute hemorrhage. There is no mass or midline shift. There is diffuse nasal turbinate edema bilaterally.   Electronically Signed   By: Lowella Grip M.D.   On: 03/05/2014 14:29   Mr Brain Wo Contrast  03/06/2014   CLINICAL DATA:  Right-sided weakness, speech difficulty. Acute left middle cerebral artery territory infarct.  EXAM: MRI HEAD WITHOUT CONTRAST  MRA HEAD WITHOUT CONTRAST  TECHNIQUE: Multiplanar, multiecho pulse sequences of the brain and surrounding structures were obtained without intravenous contrast. Angiographic images of the head were obtained using MRA technique without contrast.  COMPARISON:  CT of the head March 05, 2014 at 1408 hr  FINDINGS: MRI HEAD FINDINGS  Reduced diffusion within left frontal lobe with corresponding low ADC values. Mild expansile T2 hyperintense signal within the corresponding cortex. Local mass effect without midline shift. No susceptibility artifact to suggest hemorrhagic conversion.  Left mesial occipital lobe transcortical encephalomalacia with mild ex vacuo dilatation of the left occipital horn. The ventricles and sulci are otherwise normal for patient's age. A few scattered sub cm supratentorial white matter T2 hyperintensities, exclusive of the aforementioned acute does infarct  suggests chronic small vessel ischemic disease.  No abnormal extra-axial fluid collections. No abnormal sellar expansion. No cerebellar tonsillar ectopia. Trace paranasal sinus mucosal thickening with small right maxillary mucous retention cyst, the mastoid air cells appear well-aerated. Ocular globes and orbital contents are unremarkable. No suspicious calvarial bone marrow signal.  MRA HEAD FINDINGS  Anterior circulation: Normal flow related and enhancement of the included cervical, petrous, cavernous and supra clinoid internal carotid arteries. Normal flow related enhancement of the left M1 segment and left middle cerebral artery bifurcation. Meniscus and occlusion of left superior M2 branch, axial 42/126. Normal flow related enhancement of the anterior cerebral arteries and right middle cerebral artery.  Posterior circulation: Left vertebral artery is dominant. Normal flow related enhancement of vertebral basilar system, patent main branch vessels. Small right posterior communicating artery present. Thready left P2 segment with absent left P3 flow related enhancement.  No aneurysm.  IMPRESSION: MRI head: Acute large left middle cerebral artery territory infarct.  Remote left posterior cerebral artery territory infarct.  MRA head: Occluded left M2 superior branch consistent with acute thromboembolic disease.  Occluded versus less likely slow flow left P3 segment, thready left P2 segment.  Electronically Signed   By: Elon Alas   On: 03/06/2014 01:00   Dg Chest Port 1 View  03/05/2014   CLINICAL DATA:  LEFT side stroke, weakness, slurred speech, past history MI, cervical cancer, hypertension  EXAM: PORTABLE CHEST - 1 VIEW  COMPARISON:  Portable exam 1455 hr compared to 07/03/2011  FINDINGS: Upper normal heart size.  Normal mediastinal contours and pulmonary vascularity.  Slight rotation to the LEFT.  Lungs grossly clear.  No pleural effusion, pneumothorax or acute osseous findings.  IMPRESSION: No acute  abnormalities.   Electronically Signed   By: Lavonia Dana M.D.   On: 03/05/2014 15:01   Mr Jodene Nam Head/brain Wo Cm  03/06/2014   CLINICAL DATA:  Right-sided weakness, speech difficulty. Acute left middle cerebral artery territory infarct.  EXAM: MRI HEAD WITHOUT CONTRAST  MRA HEAD WITHOUT CONTRAST  TECHNIQUE: Multiplanar, multiecho pulse sequences of the brain and surrounding structures were obtained without intravenous contrast. Angiographic images of the head were obtained using MRA technique without contrast.  COMPARISON:  CT of the head March 05, 2014 at 1408 hr  FINDINGS: MRI HEAD FINDINGS  Reduced diffusion within left frontal lobe with corresponding low ADC values. Mild expansile T2 hyperintense signal within the corresponding cortex. Local mass effect without midline shift. No susceptibility artifact to suggest hemorrhagic conversion.  Left mesial occipital lobe transcortical encephalomalacia with mild ex vacuo dilatation of the left occipital horn. The ventricles and sulci are otherwise normal for patient's age. A few scattered sub cm supratentorial white matter T2 hyperintensities, exclusive of the aforementioned acute does infarct suggests chronic small vessel ischemic disease.  No abnormal extra-axial fluid collections. No abnormal sellar expansion. No cerebellar tonsillar ectopia. Trace paranasal sinus mucosal thickening with small right maxillary mucous retention cyst, the mastoid air cells appear well-aerated. Ocular globes and orbital contents are unremarkable. No suspicious calvarial bone marrow signal.  MRA HEAD FINDINGS  Anterior circulation: Normal flow related and enhancement of the included cervical, petrous, cavernous and supra clinoid internal carotid arteries. Normal flow related enhancement of the left M1 segment and left middle cerebral artery bifurcation. Meniscus and occlusion of left superior M2 branch, axial 42/126. Normal flow related enhancement of the anterior cerebral arteries and  right middle cerebral artery.  Posterior circulation: Left vertebral artery is dominant. Normal flow related enhancement of vertebral basilar system, patent main branch vessels. Small right posterior communicating artery present. Thready left P2 segment with absent left P3 flow related enhancement.  No aneurysm.  IMPRESSION: MRI head: Acute large left middle cerebral artery territory infarct.  Remote left posterior cerebral artery territory infarct.  MRA head: Occluded left M2 superior branch consistent with acute thromboembolic disease.  Occluded versus less likely slow flow left P3 segment, thready left P2 segment.   Electronically Signed   By: Elon Alas   On: 03/06/2014 01:00   Medications: I have reviewed the patient's current medications. Scheduled Meds: .  stroke: mapping our early stages of recovery book   Does not apply Once  . aspirin  300 mg Rectal Daily   Or  . aspirin  325 mg Oral Daily  . atorvastatin  40 mg Oral q1800   Continuous Infusions:  PRN Meds:. Assessment/Plan: Principal Problem:   Stroke Active Problems:   Elevation of cardiac enzymes   CKD (chronic kidney disease) stage 3, GFR 30-59 ml/min   Hypertension   NSTEMI (non-ST elevated myocardial infarction)   CVA (cerebral infarction)   Ischemic Stroke  -acute infarct of superior left  temporal lobe and portions of posterior left frontal lobe + left extreme capsule and insular cortex. There is no midline shift. There is cytotoxic edema. No acute hemorrhage.  - ASA 300 PO. F/up neuro recs. - MRA and MRI brain showed Acute large left middle cerebral artery territory infarct. Remote left posterior cerebral artery territory infarct. - doppler carotids showed no severe stenosis. Bilateral 1-39% ICA stenosis. - permissive HTN upto SBP 220/120. if higher then can treat with levatalol.  - neuro checks - she has high risk of recurrent strokes. - PT and OT: recommended SNF/inpatient rehab. Speech recommended dysphagia 2  diet.  - Cont to work with PT and speech therapy. - ECHO pending. F/up card recs. - hgba1c. Lipid panel showed LDL 122.started lipitor 40mg  daily (LDL goal <70).   Elevated cardiac enzymes -0.57>0.43.  - cycled trops. Elevated by trending down.  Eevation could be 2/2 to demand ischemia in the setting of stroke and HTN.  - unable to do anything for NSTEMI in the setting of stroke.  HTN  -doing permissive HTN currently in the setting of stroke.  - will address HTN after 24 hours. Uses norvasc at home.  CKD - stable now  - not sure about baseline Crt, seems to be around 1.6. Currently 1.10 better than baseline  - repeat BMP tomorrow.  Diet: dysphagia 2 Code: FULL   Dispo: Disposition is deferred at this time, awaiting improvement of current medical problems.  Anticipated discharge in approximately 2-3 day(s).   The patient does have a current PCP (No primary provider on file.) and does need an [facility] hospital follow-up appointment after discharge.  The patient does not know have transportation limitations that hinder transportation to clinic appointments.  .Services Needed at time of discharge: Y = Yes, Blank = No PT:   OT:   RN:   Equipment:   Other:     LOS: 1 day   Dellia Nims, MD 03/06/2014, 4:03 PM

## 2014-03-06 NOTE — Progress Notes (Signed)
Subjective: Patient was less tearful this morning, but still aphasic and becomes easily frustrated. Her hearing appeared to be improved. Her daughter and granddaughter came to visit her.  Objective: Vital signs in last 24 hours: Filed Vitals:   03/06/14 0300 03/06/14 0600 03/06/14 0927 03/06/14 1307  BP: 141/77 149/92 138/97 142/82  Pulse: 100 91 89 85  Temp: 98.6 F (37 C) 98.1 F (36.7 C) 98.8 F (37.1 C) 98.8 F (37.1 C)  TempSrc: Oral Oral Oral Oral  Resp: 20 20 20 20   SpO2: 95% 98% 96% 93%   Physical exam: General: Awake, lying in bed, in NAD   HEENT: Normocephalic, atraumatic, anicteric sclera, no conjunctivitis  CV: RRR, nl S1/S2, no murmurs, rubs, or gallops  Pulm: Normal work of breathing, no wheezes or crackles  GI: Soft, non tender, non distended, normoactive bowel sounds  MSK: Normal ROM in upper and lower extremities bilaterally   Neuro: Alert and oriented x3, normal vision with glasses, EOMI, PERRLA, equal sensation on each side of the face, right facial droop that becomes more pronounced while smiling, very hard of hearing, dysarthria, 5/5 strength on head turn to the right and left, tongue protudes without deviation and with normal motion side to side  Normal sensation in bilateral upper and lower extremities, 3/5 left lower extremity strength  5/5 right lower extremity strength  Lab Results: Basic Metabolic Panel:  Recent Labs  03/05/14 1508  NA 140  K 4.1  CL 102  CO2 22  GLUCOSE 93  BUN 14  CREATININE 1.10  CALCIUM 9.2   Liver Function Tests:  Recent Labs  03/05/14 1508  AST 24  ALT 15  ALKPHOS 60  BILITOT 0.5  PROT 7.4  ALBUMIN 3.8   CBC:  Recent Labs  03/05/14 1332  WBC 9.8  NEUTROABS 7.5  HGB 14.9  HCT 43.6  MCV 98.4  PLT PLATELETS APPEAR DECREASED   Cardiac Enzymes:  Recent Labs  03/05/14 1725 03/05/14 2245 03/06/14 0510  TROPONINI 0.37* 0.57* 0.43*   CBG:  Recent Labs  03/05/14 1426  GLUCAP 101*    Fasting Lipid Panel:  Recent Labs  03/05/14 2240  CHOL 191  HDL 48  LDLCALC 122*  TRIG 106  CHOLHDL 4.0   Coagulation:  Recent Labs  03/05/14 1508  LABPROT 13.9  INR 1.07   Urine Drug Screen: Drugs of Abuse     Component Value Date/Time   LABOPIA NONE DETECTED 03/05/2014 2237   COCAINSCRNUR NONE DETECTED 03/05/2014 2237   LABBENZ NONE DETECTED 03/05/2014 2237   AMPHETMU NONE DETECTED 03/05/2014 2237   THCU POSITIVE* 03/05/2014 2237   LABBARB NONE DETECTED 03/05/2014 2237    Urinalysis:  Recent Labs  03/05/14 2237  COLORURINE YELLOW  LABSPEC 1.010  PHURINE 7.5  GLUCOSEU NEGATIVE  HGBUR NEGATIVE  BILIRUBINUR NEGATIVE  KETONESUR 15*  PROTEINUR NEGATIVE  UROBILINOGEN 1.0  NITRITE NEGATIVE  LEUKOCYTESUR NEGATIVE   Imaging results:  Mr Jodene Nam Head/brain Wo Cm 03/06/2014  FINDINGS:   MRI HEAD FINDINGS  Reduced diffusion within left frontal lobe with corresponding low ADC values. Mild expansile T2 hyperintense signal within the corresponding cortex. Local mass effect without midline shift. No susceptibility artifact to suggest hemorrhagic conversion.  Left mesial occipital lobe transcortical encephalomalacia with mild ex vacuo dilatation of the left occipital horn. The ventricles and sulci are otherwise normal for patient's age. A few scattered sub cm supratentorial white matter T2 hyperintensities, exclusive of the aforementioned acute does infarct suggests chronic small vessel ischemic disease.  No abnormal extra-axial fluid collections. No abnormal sellar expansion. No cerebellar tonsillar ectopia. Trace paranasal sinus mucosal thickening with small right maxillary mucous retention cyst, the mastoid air cells appear well-aerated. Ocular globes and orbital contents are unremarkable. No suspicious calvarial bone marrow signal.    MRA HEAD FINDINGS  Anterior circulation: Normal flow related and enhancement of the included cervical, petrous, cavernous and supra clinoid  internal carotid arteries. Normal flow related enhancement of the left M1 segment and left middle cerebral artery bifurcation. Meniscus and occlusion of left superior M2 branch, axial 42/126. Normal flow related enhancement of the anterior cerebral arteries and right middle cerebral artery.  Posterior circulation: Left vertebral artery is dominant. Normal flow related enhancement of vertebral basilar system, patent main branch vessels. Small right posterior communicating artery present. Thready left P2 segment with absent left P3 flow related enhancement.  No aneurysm.    IMPRESSION:   MRI head: Acute large left middle cerebral artery territory infarct.  Remote left posterior cerebral artery territory infarct.   MRA head: Occluded left M2 superior branch consistent with acute thromboembolic disease.  Occluded versus less likely slow flow left P3 segment, thready left P2 segment.  CT Head (brain) Wo Contrast: 03/05/2014  Acute infarct involving portions of the superior left temporal lobe as well as portions of the posterior left frontal lobe. There is also involvement of the left extreme capsule and insular cortex. There is cytotoxic edema in these areas. There is decreased attenuation in the medial left occipital lobe with sparing of the post rib medial aspect of the left occipital lobe. Suspect older infarct in this area. There is no acute hemorrhage. There is no mass or midline shift. There is diffuse nasal turbinate edema bilaterally.   Dg Chest Port 1 View 03/05/2014  Upper normal heart size. Normal mediastinal contours and pulmonary vascularity. Slight rotation to the LEFT. Lungs grossly clear. No pleural effusion, pneumothorax or acute osseous findings.  No acute abnormalities.  Other results:  SLP: Dysphagia Diet 2 (liquids) and Oral care BID, and continued SLP services PT: Recommends SNF, CIR, or 24hr Supervision/Assistance, and Rehab/OT consult EKG: NSR, Old: LAD, RBBB, Q wave II,III,aVF,  New: Q wave V3-V6 Carotid duplex: Bilateral 1-39% ICA stenosis. Vertebral artery flow is antegrade.  Medications: I have reviewed the patient's current medications. Scheduled Meds: .  stroke: mapping our early stages of recovery book   Does not apply Once  . aspirin  300 mg Rectal Daily   Or  . aspirin  325 mg Oral Daily  . atorvastatin  40 mg Oral q1800   Continuous Infusions:  PRN Meds:.  Assessment/Plan: Principal problem:  Left MCA Infarction  Active problems:  Elevation of cardiac enzymes CKD  Hypertension   Traci Mclaughlin is a 63 yo female with a history of hypertension, STEMI (2012), and poor hearing, who presents with left MCA infarction.  Left MCA infarction  CT shows superior left temporal lobe and posterior left frontal lobe stroke with cytotoxic edema present. MRI/MRA shows large left MCA and small left posterior occipital thromboembolic infarcts. There is no acute hemorrhage. Lipids high at 122. Carotid dopplers 1-39%. SLP says Dysphagia 2 diet, so change suppository aspirin to PO and start atorvastatin 40 mg. -- A1c pending -- Echo pending  Dysphagia/Aphasia -- DYS 2 diet -- Continued SLP  Elevation of cardiac enzymes  Troponin of 0.23 --> 0.37 --> 0.57 --> 0.43. Denies chest pain. EKG shows old RBBB, Q waves in II,III,aVF, and new Q waves in V3-V6.  Cardiology recommends starting atorvastatin 40 mg and continuing aspirin. -- Echo pending  Hypertension  In the setting of stroke, allow her to be hypertensive and focus on treating the underlying pathology before instituting treatment.  DVT Prophylaxis -- Sequential Compression Devices  This is a Careers information officer Note.  The care of the patient was discussed with Dr. Dellia Nims and the assessment and plan formulated with their assistance.  Please see their attached note for official documentation of the daily encounter.   LOS: 1 day   Tommie Sams, Med Student 03/06/2014, 3:03 PM

## 2014-03-06 NOTE — Progress Notes (Signed)
VASCULAR LAB PRELIMINARY  PRELIMINARY  PRELIMINARY  PRELIMINARY  Carotid duplex  completed.    Preliminary report:  Bilateral:  1-39% ICA stenosis.  Vertebral artery flow is antegrade.      Norm Wray, RVT 03/06/2014, 1:02 PM

## 2014-03-06 NOTE — Evaluation (Signed)
Clinical/Bedside Swallow Evaluation Patient Details  Name: Traci Mclaughlin MRN: [id number] Date of Birth: 12/29/1950  Today's Date: 03/06/2014 Time: 1130-1140 SLP Time Calculation (min): 10 min  Past Medical History:  Past Medical History  Diagnosis Date  . Cancer     cervical; was on chemo; has been in remission for 38yrs  . STEMI (ST elevation myocardial infarction) 06/2011  . Acute kidney injury 07/06/2011  . Hypertension   . HOH (hard of hearing)    Past Surgical History:  Past Surgical History  Procedure Laterality Date  . Cardiac catheterization  07/03/2011    Dr. Irish Lack   HPI:  Traci Mclaughlin is a 63 y.o. female who presented 03/05/14 with right-sided weakness, slurred speech, and difficulty speaking x1 day. MRI revealed a significant left MCA infarction.   Assessment / Plan / Recommendation Clinical Impression  Pt presents with mild to moderately impaired mastication and posterior transit with regular and Dys 3 textures with mild right anterior loss noted during mastication. No overt signs of aspiration were noted across challenging. Recommend to initiate Dys 2 textures and thin liquids with SLP f/u for diet advancement.    Aspiration Risk  Moderate    Diet Recommendation Dysphagia 2 (Fine chop);Thin liquid   Liquid Administration via: Cup;Straw Medication Administration: Whole meds with puree Supervision: Patient able to self feed;Full supervision/cueing for compensatory strategies Compensations: Slow rate;Small sips/bites;Check for pocketing Postural Changes and/or Swallow Maneuvers: Seated upright 90 degrees    Other  Recommendations Oral Care Recommendations: Oral care BID   Follow Up Recommendations  Inpatient Rehab;24 hour supervision/assistance    Frequency and Duration min 2x/week  2 weeks   Pertinent Vitals/Pain n/a    SLP Swallow Goals     Swallow Study Prior Functional Status       General Date of Onset: 03/04/14 HPI: Traci Mclaughlin is a 63 y.o. female who presented 03/05/14 with right-sided weakness, slurred speech, and difficulty speaking x1 day. MRI revealed a significant left MCA infarction. Type of Study: Bedside swallow evaluation Previous Swallow Assessment: none in chart Diet Prior to this Study: NPO Temperature Spikes Noted: No Respiratory Status: Room air History of Recent Intubation: No Behavior/Cognition: Alert;Cooperative;Pleasant mood;Requires cueing;Hard of hearing;Other (comment) (aphasia) Oral Cavity - Dentition: Adequate natural dentition Self-Feeding Abilities: Able to feed self Patient Positioning: Upright in chair Baseline Vocal Quality: Clear Volitional Cough: Other (Comment) (unabel to elicit due to aphasia) Volitional Swallow: Able to elicit    Oral/Motor/Sensory Function     Ice Chips Ice chips: Within functional limits Presentation: Spoon   Thin Liquid Thin Liquid: Impaired Presentation: Cup;Self Fed;Straw Oral Phase Functional Implications: Oral holding (mild)    Nectar Thick Nectar Thick Liquid: Not tested   Honey Thick Honey Thick Liquid: Not tested   Puree Puree: Impaired Presentation: Self Fed;Spoon Pharyngeal Phase Impairments: Other (comments) (appeared to have intermittent effortful swallow)   Solid   GO    Solid: Impaired Presentation: Self Fed Oral Phase Impairments: Impaired mastication;Impaired anterior to posterior transit;Reduced labial seal Oral Phase Functional Implications: Oral residue;Right anterior spillage        Traci Mclaughlin, M.A. CCC-SLP [phone]  Traci Mclaughlin 03/06/2014,12:01 PM

## 2014-03-06 NOTE — Evaluation (Signed)
Speech Language Pathology Evaluation Patient Details Name: Traci Mclaughlin MRN: [id number] DOB: 02-14-1951 Today's Date: 03/06/2014 Time: 4765-4650 SLP Time Calculation (min): 24 min  Problem List:  Patient Active Problem List   Diagnosis Date Noted  . Stroke 03/05/2014  . CKD (chronic kidney disease) stage 3, GFR 30-59 ml/min 03/05/2014  . Hypertension 03/05/2014  . NSTEMI (non-ST elevated myocardial infarction) 03/05/2014  . CVA (cerebral infarction) 03/05/2014  . Elevation of cardiac enzymes 07/06/2011  . Cervical cancer 07/06/2011  . Viral gastroenteritis 07/06/2011  . Acute kidney injury 07/06/2011   Past Medical History:  Past Medical History  Diagnosis Date  . Cancer     cervical; was on chemo; has been in remission for 15yrs  . STEMI (ST elevation myocardial infarction) 06/2011  . Acute kidney injury 07/06/2011  . Hypertension   . HOH (hard of hearing)    Past Surgical History:  Past Surgical History  Procedure Laterality Date  . Cardiac catheterization  07/03/2011    Dr. Irish Lack   HPI:  Traci Mclaughlin is a 63 y.o. female who presented 03/05/14 with right-sided weakness, slurred speech, and difficulty speaking x1 day. MRI revealed a significant left MCA infarction.   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 Pt primarily responds with short automatic phrases including "don't know" and "I can't", and appears to have adequate awareness of her linguistic difficulties at this time. At this time, visual/gestural cues seem to be the most effective strategy for providing information or instructions, as pt has minimal accuracy with receptive tasks when information is presented verbally or in written form. Pt will benefit from SLP therapy during acute stay to maximize functional communication with family and staff, with  continued differential diagnosis of cognitive and speech skills. Recommend CIR upon d/c to maximize functional communication to facilitate return to community.    SLP Assessment  Patient needs continued Speech Lanaguage Pathology Services    Follow Up Recommendations  Inpatient Rehab;24 hour supervision/assistance    Frequency and Duration min 2x/week  2 weeks   Pertinent Vitals/Pain Pain Assessment: Faces Faces Pain Scale: No hurt   SLP Goals  SLP Goals Potential to Achieve Goals: Good Potential Considerations: Ability to learn/carryover information;Severity of impairments  SLP Evaluation Prior Functioning  Cognitive/Linguistic Baseline: Information not available   Cognition  Overall Cognitive Status: Difficult to assess (aphasia) Arousal/Alertness: Awake/alert Orientation Level: Other (comment) (UTA due to aphasia) Attention: Sustained Sustained Attention: Appears intact Awareness:  (emergent awareness of linguistic errors appears intact) Behaviors: Poor frustration tolerance    Comprehension  Auditory Comprehension Overall Auditory Comprehension: Impaired Yes/No Questions: Impaired Basic Biographical Questions: 0-25% accurate Commands: Impaired One Step Basic Commands: 0-24% accurate (significantly increases with visual/gestural cues) Two Step Basic Commands: 0-24% accurate (significantly increases with visual/gestural cues) Conversation: Simple Interfering Components: Hearing EffectiveTechniques: Visual/Gestural cues Visual Recognition/Discrimination Discrimination: Exceptions to [facility] Common Objects: Unable to indentify Reading Comprehension Reading Status: Impaired Word level: Impaired    Expression Expression Primary Mode of Expression: Verbal (pt tries some verbal and some gestural wayts to communicate) Verbal Expression Overall Verbal Expression: Impaired Initiation: No impairment Automatic Speech:  (none observed-attempted name and coutning) Level of  Generative/Spontaneous Verbalization:  ("don't know" "i can't") Repetition: Impaired Level of Impairment: Word level (sound level impaired) Naming: Impairment Confrontation: Impaired Common Objects: Unable to indentify Verbal Errors: Aware of errors Non-Verbal Means of Communication: Gestures (uses some gestures)  Written Expression Written Expression: Not tested   Oral / Motor Motor Speech Overall Motor Speech: Other (comment) (difficult to adequately assess given little verbal output)   GO      Germain Osgood, M.A. CCC-SLP [phone]  Germain Osgood 03/06/2014, 12:17 PM

## 2014-03-06 NOTE — Evaluation (Signed)
Physical Therapy Evaluation Patient Details Name: Traci Mclaughlin MRN: [id number] DOB: 11/12/1950 Today's Date: 03/06/2014   History of Present Illness  63 yo female with hx of MI 06/2011, HTN, cervical cancer here with right sided facial droop and slurred speech since yesterday, been complaining of LLE pain for past 1 month. Doppler was negative. She started to act differently since yesterday, tearful. HOH and responded to written communication.  PMHx:  STEMI, cervical CA, acute kidney injury, HTN, HOH.  Clinical Impression  Pt was visited with SLP coming in to overlap and develop a strategy to manage communication difficulties.  PLOF not fully documented yet by PT due to difficulty with receptive and expressive issues, complicated by being [facility].  Pt will need consult for inpt rehab of some type unless a dramatic change in her safety and functional appearance occurs.    Follow Up Recommendations SNF;CIR;Supervision/Assistance - 24 hour    Equipment Recommendations  Rolling walker with 5" wheels;3in1 (PT)    Recommendations for Other Services Rehab consult;OT consult     Precautions / Restrictions Precautions Precautions: Fall Restrictions Weight Bearing Restrictions: No      Mobility  Bed Mobility Overal bed mobility: Needs Assistance Bed Mobility: Rolling;Sidelying to Sit Rolling: Min guard Sidelying to sit: Min guard          Transfers Overall transfer level: Needs assistance Equipment used: Rolling walker (2 wheeled) Transfers: Sit to/from [facility] Sit to Stand: Min assist Stand pivot transfers: Min assist       General transfer comment: Visual cues for safety and direction were used as pt has trouble hearing verbal cues  Ambulation/Gait Ambulation/Gait assistance: Min assist Ambulation Distance (Feet): 50 Feet Assistive device: Rolling walker (2 wheeled) Gait Pattern/deviations: Step-through pattern;Decreased weight shift to  right;Ataxic;Decreased dorsiflexion - right;Decreased step length - right;Decreased step length - left (R knee buckling when tired) Gait velocity: reduced Gait velocity interpretation: Below normal speed for age/gender General Gait Details: flexed posture on RW with min assist on R from PT, close guarding of chair.  Pt demonstrates shorter steps with wide base, buckling on R knee until PT used visual cues to slow down then sit.  Turns with difficulty and needs assist to decide limits  Stairs            Wheelchair Mobility    Modified Rankin (Stroke Patients Only) Modified Rankin (Stroke Patients Only) Pre-Morbid Rankin Score: Slight disability Modified Rankin: Moderately severe disability     Balance Overall balance assessment: Needs assistance Sitting-balance support: Bilateral upper extremity supported;Feet supported Sitting balance-Leahy Scale: Fair   Postural control: Right lateral lean Standing balance support: Bilateral upper extremity supported Standing balance-Leahy Scale: Poor Standing balance comment: Pt is unsafe to stand alone as she cannot sequence with walker without help and uses visual cues to correct her safety                             Pertinent Vitals/Pain Pain Assessment: No/denies pain BP was 138/97, pulse 89 and O2 sat 96% per nsg notes.    Home Living Family/patient expects to be discharged to:: Unsure                 Additional Comments: Pt not fully understanding the questions or responding accurately    Prior Function Level of Independence: Independent with assistive device(s) (per pt)               Hand Dominance  Extremity/Trunk Assessment   Upper Extremity Assessment: Generalized weakness           Lower Extremity Assessment: Generalized weakness      Cervical / Trunk Assessment: Kyphotic  Communication   Communication: Receptive difficulties;Expressive difficulties;HOH  Cognition  Arousal/Alertness: Awake/alert Behavior During Therapy: WFL for tasks assessed/performed Overall Cognitive Status: Difficult to assess                      General Comments General comments (skin integrity, edema, etc.): LE's are mildly edematous and in compression sleeves when in bed.      Exercises        Assessment/Plan    PT Assessment Patient needs continued PT services  PT Diagnosis Difficulty walking   PT Problem List Decreased strength;Decreased range of motion;Decreased activity tolerance;Decreased balance;Decreased mobility;Decreased coordination;Decreased cognition;Decreased knowledge of use of DME;Decreased safety awareness;Decreased knowledge of precautions;Obesity;Other (comment) (ability to teach her the precautions for falls)  PT Treatment Interventions DME instruction;Gait training;Stair training;Functional mobility training;Therapeutic activities;Therapeutic exercise;Balance training;Neuromuscular re-education;Patient/family education   PT Goals (Current goals can be found in the Care Plan section) Acute Rehab PT Goals Patient Stated Goal: none stated PT Goal Formulation: Patient unable to participate in goal setting Time For Goal Achievement: 03/20/14 Potential to Achieve Goals: Good    Frequency Min 4X/week   Barriers to discharge Other (comment) (unaware of assistance available to her at home) Daughter coming to  hospital for pt and will be able to hopefully provide PLOF    Co-evaluation PT/OT/SLP Co-Evaluation/Treatment: Yes Reason for Co-Treatment: Other (comment) (to facilitate communciation/language during mobility tasks) PT goals addressed during session: Mobility/safety with mobility;Balance;Proper use of DME;Other (comment) (Communication with pt)   SLP goals addressed during session: Communication;Cognition     End of Session   Activity Tolerance: Patient limited by fatigue (Limited by weakness) Patient left: in chair;with call  bell/phone within reach;with nursing/sitter in room;Other (comment) (SLP in room assessing when PT left) Nurse Communication: Mobility status         Time: 6415-8309 PT Time Calculation (min): 31 min   Charges:   PT Evaluation $Initial PT Evaluation Tier I: 1 Procedure PT Treatments $Gait Training: 8-22 mins $Therapeutic Activity: 8-22 mins   PT G Codes:          Ramond Dial March 13, 2014, 11:56 AM  Mee Hives, PT MS Acute Rehab Dept. Number: [phone]

## 2014-03-06 NOTE — Progress Notes (Signed)
OT Cancellation Note  Patient Details Name: Buford Gayler MRN: [id number] DOB: 02-03-1951   Cancelled Treatment:    Reason Eval/Treat Not Completed: Other (comment) (SLP in room with pt)  Benito Mccreedy OTR/L [phone] 03/06/2014, 11:23 AM

## 2014-03-06 NOTE — H&P (Signed)
  Date: 03/06/2014  Patient name: Traci Mclaughlin  Medical record number: [id number]  Date of birth: [birth year]   This patient has been seen and the plan of care was discussed with the house staff. Please see their note for complete details. I concur with their findings with the following additions/corrections:  Traci Mclaughlin comes to the hospital with late presentation of righ facial droop/slurred speech, outside of the window for tPA.Imaging and clinical exam consistent with left MCA distribution infarct. She is admitted for management of stroke. She is also found to have troponin elevation consistent with NSTEMI, with contra-indication for anticoagulation given her presentation fo stroke.  Traci Basques, MD 03/06/2014, 4:02 PM

## 2014-03-06 NOTE — Progress Notes (Addendum)
Stroke Team Progress Note  HISTORY Traci Mclaughlin is a 63 y.o. female he was in her normal state of health up until yesterday when she began noticing right-sided weakness, slurred speech, difficulty speaking. Today, her daughter and asked her to come to the emergency room where was found she has a significant left MCA infarction.  LKW: 8/9  tpa given?: no, outside of window . She was admitted to the neuro floor bed for further evaluation and treatment.  SUBJECTIVE Her RN is at the bedside.  Overall she feels her condition is gradually worsening. She has remained more sleepy this morning and aphasic and has mild right-sided weakness. No family available at the bedside. Troponin levels are elevated but cardiology feels it is secondary to stroke  OBJECTIVE Most recent Vital Signs: Filed Vitals:   03/06/14 0200 03/06/14 0300 03/06/14 0600 03/06/14 0927  BP: 152/73 141/77 149/92 138/97  Pulse: 95 100 91 89  Temp: 98.6 F (37 C) 98.6 F (37 C) 98.1 F (36.7 C) 98.8 F (37.1 C)  TempSrc: Oral Oral Oral Oral  Resp: 18 20 20 20   SpO2: 98% 95% 98% 96%   CBG (last 3)   Recent Labs  03/05/14 1426  GLUCAP 101*    IV Fluid Intake:     MEDICATIONS  .  stroke: mapping our early stages of recovery book   Does not apply Once  . aspirin  300 mg Rectal Daily   Or  . aspirin  325 mg Oral Daily   PRN:    Diet:  NPO   Activity:  Bedrest  DVT Prophylaxis:  SCDs  CLINICALLY SIGNIFICANT STUDIES Basic Metabolic Panel:  Recent Labs Lab 03/05/14 1508  NA 140  K 4.1  CL 102  CO2 22  GLUCOSE 93  BUN 14  CREATININE 1.10  CALCIUM 9.2   Liver Function Tests:  Recent Labs Lab 03/05/14 1508  AST 24  ALT 15  ALKPHOS 60  BILITOT 0.5  PROT 7.4  ALBUMIN 3.8   CBC:  Recent Labs Lab 03/05/14 1332  WBC 9.8  NEUTROABS 7.5  HGB 14.9  HCT 43.6  MCV 98.4  PLT PLATELETS APPEAR DECREASED   Coagulation:  Recent Labs Lab 03/05/14 1508  LABPROT 13.9  INR 1.07   Cardiac  Enzymes:  Recent Labs Lab 03/05/14 1725 03/05/14 2245 03/06/14 0510  TROPONINI 0.37* 0.57* 0.43*   Urinalysis:  Recent Labs Lab 03/05/14 2237  COLORURINE YELLOW  LABSPEC 1.010  PHURINE 7.5  GLUCOSEU NEGATIVE  HGBUR NEGATIVE  BILIRUBINUR NEGATIVE  KETONESUR 15*  PROTEINUR NEGATIVE  UROBILINOGEN 1.0  NITRITE NEGATIVE  LEUKOCYTESUR NEGATIVE   Lipid Panel    Component Value Date/Time   CHOL 191 03/05/2014 2240   TRIG 106 03/05/2014 2240   HDL 48 03/05/2014 2240   CHOLHDL 4.0 03/05/2014 2240   VLDL 21 03/05/2014 2240   LDLCALC 122* 03/05/2014 2240   HgbA1C  Lab Results  Component Value Date   HGBA1C 5.8* 07/03/2011    Urine Drug Screen:     Component Value Date/Time   LABOPIA NONE DETECTED 03/05/2014 2237   LABOPIA NEGATIVE 07/05/2011 0936   COCAINSCRNUR NONE DETECTED 03/05/2014 2237   COCAINSCRNUR NEGATIVE 07/05/2011 0936   LABBENZ NONE DETECTED 03/05/2014 2237   LABBENZ NEGATIVE 07/05/2011 0936   AMPHETMU NONE DETECTED 03/05/2014 2237   AMPHETMU NEGATIVE 07/05/2011 0936   THCU POSITIVE* 03/05/2014 2237   LABBARB NONE DETECTED 03/05/2014 2237    Alcohol Level: No results found for this basename: ETH,  in the last 168 hours  Ct Head (brain) Wo Contrast  03/05/2014   CLINICAL DATA:  Facial droop and dysarthria ; right-sided weakness  EXAM: CT HEAD WITHOUT CONTRAST  TECHNIQUE: Contiguous axial images were obtained from the base of the skull through the vertex without intravenous contrast.  COMPARISON:  None.  FINDINGS: The ventricles are normal in size and configuration. There is no demonstrable mass, hemorrhage, extra-axial fluid collection, or midline shift. There is decreased attenuation in the superior left temporal lobe with involvement of the left extreme capsule and insular cortex as well consistent with an acute infarct with focal cytotoxic edema in this area. This recent appearing infarct extends more superiorly to involve a portion of the posterior left frontal lobe.  Bony  calvarium appears intact. The mastoid air cells are clear. There is nasal turbinate edema bilaterally. There is also decreased attenuation in a portion of the medial left occipital lobe which appears more chronic and may represent an older infarct. Elsewhere gray-white compartments appear unremarkable.  IMPRESSION: Acute infarct involving portions of the superior left temporal lobe as well as portions of the posterior left frontal lobe. There is also involvement of the left extreme capsule and insular cortex. There is cytotoxic edema in these areas.  There is decreased attenuation in the medial left occipital lobe with sparing of the post rib medial aspect of the left occipital lobe. Suspect older infarct in this area. There is no acute hemorrhage. There is no mass or midline shift. There is diffuse nasal turbinate edema bilaterally.   Electronically Signed   By: Lowella Grip M.D.   On: 03/05/2014 14:29   Mr Brain Wo Contrast  03/06/2014   CLINICAL DATA:  Right-sided weakness, speech difficulty. Acute left middle cerebral artery territory infarct.  EXAM: MRI HEAD WITHOUT CONTRAST  MRA HEAD WITHOUT CONTRAST  TECHNIQUE: Multiplanar, multiecho pulse sequences of the brain and surrounding structures were obtained without intravenous contrast. Angiographic images of the head were obtained using MRA technique without contrast.  COMPARISON:  CT of the head March 05, 2014 at 1408 hr  FINDINGS: MRI HEAD FINDINGS  Reduced diffusion within left frontal lobe with corresponding low ADC values. Mild expansile T2 hyperintense signal within the corresponding cortex. Local mass effect without midline shift. No susceptibility artifact to suggest hemorrhagic conversion.  Left mesial occipital lobe transcortical encephalomalacia with mild ex vacuo dilatation of the left occipital horn. The ventricles and sulci are otherwise normal for patient's age. A few scattered sub cm supratentorial white matter T2 hyperintensities,  exclusive of the aforementioned acute does infarct suggests chronic small vessel ischemic disease.  No abnormal extra-axial fluid collections. No abnormal sellar expansion. No cerebellar tonsillar ectopia. Trace paranasal sinus mucosal thickening with small right maxillary mucous retention cyst, the mastoid air cells appear well-aerated. Ocular globes and orbital contents are unremarkable. No suspicious calvarial bone marrow signal.  MRA HEAD FINDINGS  Anterior circulation: Normal flow related and enhancement of the included cervical, petrous, cavernous and supra clinoid internal carotid arteries. Normal flow related enhancement of the left M1 segment and left middle cerebral artery bifurcation. Meniscus and occlusion of left superior M2 branch, axial 42/126. Normal flow related enhancement of the anterior cerebral arteries and right middle cerebral artery.  Posterior circulation: Left vertebral artery is dominant. Normal flow related enhancement of vertebral basilar system, patent main branch vessels. Small right posterior communicating artery present. Thready left P2 segment with absent left P3 flow related enhancement.  No aneurysm.  IMPRESSION: MRI head: Acute  large left middle cerebral artery territory infarct.  Remote left posterior cerebral artery territory infarct.  MRA head: Occluded left M2 superior branch consistent with acute thromboembolic disease.  Occluded versus less likely slow flow left P3 segment, thready left P2 segment.   Electronically Signed   By: Elon Alas   On: 03/06/2014 01:00   Dg Chest Port 1 View  03/05/2014   CLINICAL DATA:  LEFT side stroke, weakness, slurred speech, past history MI, cervical cancer, hypertension  EXAM: PORTABLE CHEST - 1 VIEW  COMPARISON:  Portable exam 1455 hr compared to 07/03/2011  FINDINGS: Upper normal heart size.  Normal mediastinal contours and pulmonary vascularity.  Slight rotation to the LEFT.  Lungs grossly clear.  No pleural effusion,  pneumothorax or acute osseous findings.  IMPRESSION: No acute abnormalities.   Electronically Signed   By: Lavonia Dana M.D.   On: 03/05/2014 15:01   Mr Jodene Nam Head/brain Wo Cm  03/06/2014   CLINICAL DATA:  Right-sided weakness, speech difficulty. Acute left middle cerebral artery territory infarct.  EXAM: MRI HEAD WITHOUT CONTRAST  MRA HEAD WITHOUT CONTRAST  TECHNIQUE: Multiplanar, multiecho pulse sequences of the brain and surrounding structures were obtained without intravenous contrast. Angiographic images of the head were obtained using MRA technique without contrast.  COMPARISON:  CT of the head March 05, 2014 at 1408 hr  FINDINGS: MRI HEAD FINDINGS  Reduced diffusion within left frontal lobe with corresponding low ADC values. Mild expansile T2 hyperintense signal within the corresponding cortex. Local mass effect without midline shift. No susceptibility artifact to suggest hemorrhagic conversion.  Left mesial occipital lobe transcortical encephalomalacia with mild ex vacuo dilatation of the left occipital horn. The ventricles and sulci are otherwise normal for patient's age. A few scattered sub cm supratentorial white matter T2 hyperintensities, exclusive of the aforementioned acute does infarct suggests chronic small vessel ischemic disease.  No abnormal extra-axial fluid collections. No abnormal sellar expansion. No cerebellar tonsillar ectopia. Trace paranasal sinus mucosal thickening with small right maxillary mucous retention cyst, the mastoid air cells appear well-aerated. Ocular globes and orbital contents are unremarkable. No suspicious calvarial bone marrow signal.  MRA HEAD FINDINGS  Anterior circulation: Normal flow related and enhancement of the included cervical, petrous, cavernous and supra clinoid internal carotid arteries. Normal flow related enhancement of the left M1 segment and left middle cerebral artery bifurcation. Meniscus and occlusion of left superior M2 branch, axial 42/126. Normal  flow related enhancement of the anterior cerebral arteries and right middle cerebral artery.  Posterior circulation: Left vertebral artery is dominant. Normal flow related enhancement of vertebral basilar system, patent main branch vessels. Small right posterior communicating artery present. Thready left P2 segment with absent left P3 flow related enhancement.  No aneurysm.  IMPRESSION: MRI head: Acute large left middle cerebral artery territory infarct.  Remote left posterior cerebral artery territory infarct.  MRA head: Occluded left M2 superior branch consistent with acute thromboembolic disease.  Occluded versus less likely slow flow left P3 segment, thready left P2 segment.   Electronically Signed   By: Elon Alas   On: 03/06/2014 01:00      Carotid Doppler  pending  2D Echocardiogram  pending  CXR  No acute abnormalities.   EKG Normal sinus rhythm Left axis deviation Right bundle branch block Minimal voltage criteria for LVH, may be normal variant Inferior infarct , age undetermined Anterolateral infarct , age undetermined Therapy Recommendations pending Physical Exam   Frail elderly African American lady currently not in distress. Marland Kitchen  Afebrile. Head is nontraumatic. Neck is supple without bruit.   Cardiac exam no murmur or gallop. Lungs are clear to auscultation. Distal pulses are well felt. Neurological Exam : Drowsy can be aroused with difficulty. Marked expressive aphasia can speak a few words and short sentences but at times difficult to understand. She can follow midline and few simple commands only. Slight left gaze preference but able to look to the right past midline. Pupils equal and reactive. Fundi were not visualized. Vision acuity cannot be tested. Blinks to threat on the left but not on the right. Mild right lower facial weakness. Tongue is midline. Motor system exam will mild right lower extremity drift. I weakness of right grip and intrinsic hand muscles. Sensation  diminished on the right side. Deep tendon reflexes are symmetric. Not cooperative for coordination testing. Gait was deferred. ASSESSMENT Traci Mclaughlin is a 63 y.o. 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 Cardiac embolism also possibility given elevated cardiac troponins  On no antiplatelets prior to admission. Now on aspirin 300 mg rectally every day for secondary stroke prevention. Patient with resultant aphasia and mild right hemiparesis.. Stroke work up underway.   HT-on amlodipine PTA  LDL 122-not on statins PTA  Cannabis abuse  Intracranial athersoclerosis   Hospital day # 1  TREATMENT/PLAN  Continue aspirin for secondary stroke prevention.  Speech therapy consult for swallowing. Physical and occupational therapy consults.  Mobilize out of bed.  Check echocardiogram and carotid Dopplers  She is likely need to have prolonged rehabilitation needs  Add statin when she is able to swallow I have personally examined this patient, reviewed notes, independently viewed imaging studies, participated in medical decision making and plan of care.. she remains at risk for neurological worsening and recurrent strokes and care requires medical decision making of high complexity Antony Contras, MD Medical Director [facility] Pager: [phone] 03/06/2014 11:12 AM      SIGNED    To contact Stroke Continuity provider, please refer to [email or web address] After hours, contact General Neurology

## 2014-03-06 NOTE — Progress Notes (Signed)
DAILY PROGRESS NOTE  Subjective:  No chest pain. Echo being performed. Troponin mildly elevated without significant change, which likely attributes to her dense L MCA stroke.  Objective:  Temp:  [97.9 F (36.6 C)-99 F (37.2 C)] 98.8 F (37.1 C) (08/11 0927) Pulse Rate:  [69-102] 89 (08/11 0927) Resp:  [13-29] 20 (08/11 0927) BP: (130-182)/(73-107) 138/97 mmHg (08/11 0927) SpO2:  [91 %-100 %] 96 % (08/11 0927) Weight change:   Intake/Output from previous day:    Intake/Output from this shift:    Medications: Current Facility-Administered Medications  Medication Dose Route Frequency Provider Last Rate Last Dose  .  stroke: mapping our early stages of recovery book   Does not apply Once Wilber Oliphant, MD      . aspirin suppository 300 mg  300 mg Rectal Daily Wilber Oliphant, MD   300 mg at 03/06/14 1051   Or  . aspirin tablet 325 mg  325 mg Oral Daily Wilber Oliphant, MD        Physical Exam: General appearance: alert and no distress Lungs: clear to auscultation bilaterally Heart: regular rate and rhythm, S1, S2 normal, no murmur, click, rub or gallop Extremities: extremities normal, atraumatic, no cyanosis or edema Neurologic: Mental status: Alert, expressive aphasia  Lab Results: Results for orders placed during the hospital encounter of 03/05/14 (from the past 48 hour(s))  CBC     Status: None   Collection Time    03/05/14  1:32 PM      Result Value Ref Range   WBC 9.8  4.0 - 10.5 K/uL   Comment: WHITE COUNT CONFIRMED ON SMEAR   RBC 4.43  3.87 - 5.11 MIL/uL   Hemoglobin 14.9  12.0 - 15.0 g/dL   HCT 43.6  36.0 - 46.0 %   MCV 98.4  78.0 - 100.0 fL   MCH 33.6  26.0 - 34.0 pg   MCHC 34.2  30.0 - 36.0 g/dL   RDW 12.8  11.5 - 15.5 %   Platelets PLATELETS APPEAR DECREASED  150 - 400 K/uL  DIFFERENTIAL     Status: None   Collection Time    03/05/14  1:32 PM      Result Value Ref Range   Neutrophils Relative % 77  43 - 77 %   Lymphocytes Relative 15  12  - 46 %   Monocytes Relative 7  3 - 12 %   Eosinophils Relative 1  0 - 5 %   Basophils Relative 0  0 - 1 %   Neutro Abs 7.5  1.7 - 7.7 K/uL   Lymphs Abs 1.5  0.7 - 4.0 K/uL   Monocytes Absolute 0.7  0.1 - 1.0 K/uL   Eosinophils Absolute 0.1  0.0 - 0.7 K/uL   Basophils Absolute 0.0  0.0 - 0.1 K/uL   Smear Review       Value: PLATELET CLUMPS NOTED ON SMEAR, COUNT APPEARS DECREASED  CBG MONITORING, ED     Status: Abnormal   Collection Time    03/05/14  2:26 PM      Result Value Ref Range   Glucose-Capillary 101 (*) 70 - 99 mg/dL  PROTIME-INR     Status: None   Collection Time    03/05/14  3:08 PM      Result Value Ref Range   Prothrombin Time 13.9  11.6 - 15.2 seconds   INR 1.07  0.00 - 1.49  APTT     Status: Abnormal  Collection Time    03/05/14  3:08 PM      Result Value Ref Range   aPTT 40 (*) 24 - 37 seconds   Comment:            IF BASELINE aPTT IS ELEVATED,     SUGGEST PATIENT RISK ASSESSMENT     BE USED TO DETERMINE APPROPRIATE     ANTICOAGULANT THERAPY.  COMPREHENSIVE METABOLIC PANEL     Status: Abnormal   Collection Time    03/05/14  3:08 PM      Result Value Ref Range   Sodium 140  137 - 147 mEq/L   Potassium 4.1  3.7 - 5.3 mEq/L   Chloride 102  96 - 112 mEq/L   CO2 22  19 - 32 mEq/L   Glucose, Bld 93  70 - 99 mg/dL   BUN 14  6 - 23 mg/dL   Creatinine, Ser 1.10  0.50 - 1.10 mg/dL   Calcium 9.2  8.4 - 10.5 mg/dL   Total Protein 7.4  6.0 - 8.3 g/dL   Albumin 3.8  3.5 - 5.2 g/dL   AST 24  0 - 37 U/L   ALT 15  0 - 35 U/L   Alkaline Phosphatase 60  39 - 117 U/L   Total Bilirubin 0.5  0.3 - 1.2 mg/dL   GFR calc non Af Amer 52 (*) >90 mL/min   GFR calc Af Amer 61 (*) >90 mL/min   Comment: (NOTE)     The eGFR has been calculated using the CKD EPI equation.     This calculation has not been validated in all clinical situations.     eGFR's persistently <90 mL/min signify possible Chronic Kidney     Disease.   Anion gap 16 (*) 5 - 15  I-STAT TROPOININ, ED      Status: Abnormal   Collection Time    03/05/14  3:45 PM      Result Value Ref Range   Troponin i, poc 0.23 (*) 0.00 - 0.08 ng/mL   Comment NOTIFIED PHYSICIAN     Comment 3            Comment: Due to the release kinetics of cTnI,     a negative result within the first hours     of the onset of symptoms does not rule out     myocardial infarction with certainty.     If myocardial infarction is still suspected,     repeat the test at appropriate intervals.  HIV ANTIBODY (ROUTINE TESTING)     Status: None   Collection Time    03/05/14  5:10 PM      Result Value Ref Range   HIV 1&2 Ab, 4th Generation NONREACTIVE  NONREACTIVE   Comment: (NOTE)     A NONREACTIVE HIV Ag/Ab result does not exclude HIV infection since     the time frame for seroconversion is variable. If acute HIV infection     is suspected, a HIV-1 RNA Qualitative TMA test is recommended.     HIV-1/2 Antibody Diff         Not indicated.     HIV-1 RNA, Qual TMA           Not indicated.     PLEASE NOTE: This information has been disclosed to you from records     whose confidentiality may be protected by state law. If your state     requires such protection, then the state law  prohibits you from making     any further disclosure of the information without the specific written     consent of the person to whom it pertains, or as otherwise permitted     by law. A general authorization for the release of medical or other     information is NOT sufficient for this purpose.     The performance of this assay has not been clinically validated in     patients less than 24 years old.     Performed at [facility]  TROPONIN I     Status: Abnormal   Collection Time    03/05/14  5:25 PM      Result Value Ref Range   Troponin I 0.37 (*) <0.30 ng/mL   Comment:            Due to the release kinetics of cTnI,     a negative result within the first hours     of the onset of symptoms does not rule out     myocardial infarction  with certainty.     If myocardial infarction is still suspected,     repeat the test at appropriate intervals.     CRITICAL RESULT CALLED TO, READ BACK BY AND VERIFIED WITH:     E ALI,RN [id number] 03/05/14 WBOND  URINALYSIS, ROUTINE W REFLEX MICROSCOPIC     Status: Abnormal   Collection Time    03/05/14 10:37 PM      Result Value Ref Range   Color, Urine YELLOW  YELLOW   APPearance CLEAR  CLEAR   Specific Gravity, Urine 1.010  1.005 - 1.030   pH 7.5  5.0 - 8.0   Glucose, UA NEGATIVE  NEGATIVE mg/dL   Hgb urine dipstick NEGATIVE  NEGATIVE   Bilirubin Urine NEGATIVE  NEGATIVE   Ketones, ur 15 (*) NEGATIVE mg/dL   Protein, ur NEGATIVE  NEGATIVE mg/dL   Urobilinogen, UA 1.0  0.0 - 1.0 mg/dL   Nitrite NEGATIVE  NEGATIVE   Leukocytes, UA NEGATIVE  NEGATIVE   Comment: MICROSCOPIC NOT DONE ON URINES WITH NEGATIVE PROTEIN, BLOOD, LEUKOCYTES, NITRITE, OR GLUCOSE <1000 mg/dL.  URINE RAPID DRUG SCREEN (HOSP PERFORMED)     Status: Abnormal   Collection Time    03/05/14 10:37 PM      Result Value Ref Range   Opiates NONE DETECTED  NONE DETECTED   Cocaine NONE DETECTED  NONE DETECTED   Benzodiazepines NONE DETECTED  NONE DETECTED   Amphetamines NONE DETECTED  NONE DETECTED   Tetrahydrocannabinol POSITIVE (*) NONE DETECTED   Barbiturates NONE DETECTED  NONE DETECTED   Comment:            DRUG SCREEN FOR MEDICAL PURPOSES     ONLY.  IF CONFIRMATION IS NEEDED     FOR ANY PURPOSE, NOTIFY LAB     WITHIN 5 DAYS.                LOWEST DETECTABLE LIMITS     FOR URINE DRUG SCREEN     Drug Class       Cutoff (ng/mL)     Amphetamine      1000     Barbiturate      200     Benzodiazepine   264     Tricyclics       158     Opiates          300     Cocaine  300     THC              50  LIPID PANEL     Status: Abnormal   Collection Time    03/05/14 10:40 PM      Result Value Ref Range   Cholesterol 191  0 - 200 mg/dL   Triglycerides 106  <150 mg/dL   HDL 48  >39 mg/dL   Total CHOL/HDL Ratio  4.0     VLDL 21  0 - 40 mg/dL   LDL Cholesterol 122 (*) 0 - 99 mg/dL   Comment:            Total Cholesterol/HDL:CHD Risk     Coronary Heart Disease Risk Table                         Men   Women      1/2 Average Risk   3.4   3.3      Average Risk       5.0   4.4      2 X Average Risk   9.6   7.1      3 X Average Risk  23.4   11.0                Use the calculated Patient Ratio     above and the CHD Risk Table     to determine the patient's CHD Risk.                ATP III CLASSIFICATION (LDL):      <100     mg/dL   Optimal      100-129  mg/dL   Near or Above                        Optimal      130-159  mg/dL   Borderline      160-189  mg/dL   High      >190     mg/dL   Very High  TROPONIN I     Status: Abnormal   Collection Time    03/05/14 10:45 PM      Result Value Ref Range   Troponin I 0.57 (*) <0.30 ng/mL   Comment:            Due to the release kinetics of cTnI,     a negative result within the first hours     of the onset of symptoms does not rule out     myocardial infarction with certainty.     If myocardial infarction is still suspected,     repeat the test at appropriate intervals.     CRITICAL VALUE NOTED.  VALUE IS CONSISTENT WITH PREVIOUSLY REPORTED AND CALLED VALUE.  TROPONIN I     Status: Abnormal   Collection Time    03/06/14  5:10 AM      Result Value Ref Range   Troponin I 0.43 (*) <0.30 ng/mL   Comment:            Due to the release kinetics of cTnI,     a negative result within the first hours     of the onset of symptoms does not rule out     myocardial infarction with certainty.     If myocardial infarction is still suspected,     repeat the test at appropriate intervals.  CRITICAL VALUE NOTED.  VALUE IS CONSISTENT WITH PREVIOUSLY REPORTED AND CALLED VALUE.    Imaging: Ct Head (brain) Wo Contrast  03/05/2014   CLINICAL DATA:  Facial droop and dysarthria ; right-sided weakness  EXAM: CT HEAD WITHOUT CONTRAST  TECHNIQUE: Contiguous axial  images were obtained from the base of the skull through the vertex without intravenous contrast.  COMPARISON:  None.  FINDINGS: The ventricles are normal in size and configuration. There is no demonstrable mass, hemorrhage, extra-axial fluid collection, or midline shift. There is decreased attenuation in the superior left temporal lobe with involvement of the left extreme capsule and insular cortex as well consistent with an acute infarct with focal cytotoxic edema in this area. This recent appearing infarct extends more superiorly to involve a portion of the posterior left frontal lobe.  Bony calvarium appears intact. The mastoid air cells are clear. There is nasal turbinate edema bilaterally. There is also decreased attenuation in a portion of the medial left occipital lobe which appears more chronic and may represent an older infarct. Elsewhere gray-white compartments appear unremarkable.  IMPRESSION: Acute infarct involving portions of the superior left temporal lobe as well as portions of the posterior left frontal lobe. There is also involvement of the left extreme capsule and insular cortex. There is cytotoxic edema in these areas.  There is decreased attenuation in the medial left occipital lobe with sparing of the post rib medial aspect of the left occipital lobe. Suspect older infarct in this area. There is no acute hemorrhage. There is no mass or midline shift. There is diffuse nasal turbinate edema bilaterally.   Electronically Signed   By: Lowella Grip M.D.   On: 03/05/2014 14:29   Mr Brain Wo Contrast  03/06/2014   CLINICAL DATA:  Right-sided weakness, speech difficulty. Acute left middle cerebral artery territory infarct.  EXAM: MRI HEAD WITHOUT CONTRAST  MRA HEAD WITHOUT CONTRAST  TECHNIQUE: Multiplanar, multiecho pulse sequences of the brain and surrounding structures were obtained without intravenous contrast. Angiographic images of the head were obtained using MRA technique without  contrast.  COMPARISON:  CT of the head March 05, 2014 at 1408 hr  FINDINGS: MRI HEAD FINDINGS  Reduced diffusion within left frontal lobe with corresponding low ADC values. Mild expansile T2 hyperintense signal within the corresponding cortex. Local mass effect without midline shift. No susceptibility artifact to suggest hemorrhagic conversion.  Left mesial occipital lobe transcortical encephalomalacia with mild ex vacuo dilatation of the left occipital horn. The ventricles and sulci are otherwise normal for patient's age. A few scattered sub cm supratentorial white matter T2 hyperintensities, exclusive of the aforementioned acute does infarct suggests chronic small vessel ischemic disease.  No abnormal extra-axial fluid collections. No abnormal sellar expansion. No cerebellar tonsillar ectopia. Trace paranasal sinus mucosal thickening with small right maxillary mucous retention cyst, the mastoid air cells appear well-aerated. Ocular globes and orbital contents are unremarkable. No suspicious calvarial bone marrow signal.  MRA HEAD FINDINGS  Anterior circulation: Normal flow related and enhancement of the included cervical, petrous, cavernous and supra clinoid internal carotid arteries. Normal flow related enhancement of the left M1 segment and left middle cerebral artery bifurcation. Meniscus and occlusion of left superior M2 branch, axial 42/126. Normal flow related enhancement of the anterior cerebral arteries and right middle cerebral artery.  Posterior circulation: Left vertebral artery is dominant. Normal flow related enhancement of vertebral basilar system, patent main branch vessels. Small right posterior communicating artery present. Thready left P2 segment with absent left P3  flow related enhancement.  No aneurysm.  IMPRESSION: MRI head: Acute large left middle cerebral artery territory infarct.  Remote left posterior cerebral artery territory infarct.  MRA head: Occluded left M2 superior branch  consistent with acute thromboembolic disease.  Occluded versus less likely slow flow left P3 segment, thready left P2 segment.   Electronically Signed   By: Elon Alas   On: 03/06/2014 01:00   Dg Chest Port 1 View  03/05/2014   CLINICAL DATA:  LEFT side stroke, weakness, slurred speech, past history MI, cervical cancer, hypertension  EXAM: PORTABLE CHEST - 1 VIEW  COMPARISON:  Portable exam 1455 hr compared to 07/03/2011  FINDINGS: Upper normal heart size.  Normal mediastinal contours and pulmonary vascularity.  Slight rotation to the LEFT.  Lungs grossly clear.  No pleural effusion, pneumothorax or acute osseous findings.  IMPRESSION: No acute abnormalities.   Electronically Signed   By: Lavonia Dana M.D.   On: 03/05/2014 15:01   Mr Jodene Nam Head/brain Wo Cm  03/06/2014   CLINICAL DATA:  Right-sided weakness, speech difficulty. Acute left middle cerebral artery territory infarct.  EXAM: MRI HEAD WITHOUT CONTRAST  MRA HEAD WITHOUT CONTRAST  TECHNIQUE: Multiplanar, multiecho pulse sequences of the brain and surrounding structures were obtained without intravenous contrast. Angiographic images of the head were obtained using MRA technique without contrast.  COMPARISON:  CT of the head March 05, 2014 at 1408 hr  FINDINGS: MRI HEAD FINDINGS  Reduced diffusion within left frontal lobe with corresponding low ADC values. Mild expansile T2 hyperintense signal within the corresponding cortex. Local mass effect without midline shift. No susceptibility artifact to suggest hemorrhagic conversion.  Left mesial occipital lobe transcortical encephalomalacia with mild ex vacuo dilatation of the left occipital horn. The ventricles and sulci are otherwise normal for patient's age. A few scattered sub cm supratentorial white matter T2 hyperintensities, exclusive of the aforementioned acute does infarct suggests chronic small vessel ischemic disease.  No abnormal extra-axial fluid collections. No abnormal sellar expansion. No  cerebellar tonsillar ectopia. Trace paranasal sinus mucosal thickening with small right maxillary mucous retention cyst, the mastoid air cells appear well-aerated. Ocular globes and orbital contents are unremarkable. No suspicious calvarial bone marrow signal.  MRA HEAD FINDINGS  Anterior circulation: Normal flow related and enhancement of the included cervical, petrous, cavernous and supra clinoid internal carotid arteries. Normal flow related enhancement of the left M1 segment and left middle cerebral artery bifurcation. Meniscus and occlusion of left superior M2 branch, axial 42/126. Normal flow related enhancement of the anterior cerebral arteries and right middle cerebral artery.  Posterior circulation: Left vertebral artery is dominant. Normal flow related enhancement of vertebral basilar system, patent main branch vessels. Small right posterior communicating artery present. Thready left P2 segment with absent left P3 flow related enhancement.  No aneurysm.  IMPRESSION: MRI head: Acute large left middle cerebral artery territory infarct.  Remote left posterior cerebral artery territory infarct.  MRA head: Occluded left M2 superior branch consistent with acute thromboembolic disease.  Occluded versus less likely slow flow left P3 segment, thready left P2 segment.   Electronically Signed   By: Elon Alas   On: 03/06/2014 01:00    Assessment:  1. Principal Problem: 2.   Stroke 3. Active Problems: 4.   Elevation of cardiac enzymes 5.   CKD (chronic kidney disease) stage 3, GFR 30-59 ml/min 6.   Hypertension 7.   NSTEMI (non-ST elevated myocardial infarction) 8.   CVA (cerebral infarction) 9.   Plan:  1. Will review echo results. If systolic function is normal then there are few additional recommendations at this time. Would recommend starting lipitor 40 mg daily if she is able to swallow medications and would continue on daily aspirin.  Time Spent Directly with Patient:  15  minutes  Length of Stay:  LOS: 1 day   Pixie Casino, MD, [facility] Attending Cardiologist [facility]  Sender Rueb C 03/06/2014, 11:52 AM

## 2014-03-06 NOTE — Progress Notes (Signed)
UR complete.  Ariyanna Oien RN, MSN 

## 2014-03-06 NOTE — Evaluation (Addendum)
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 Doppler was negative. She started to act differently since yesterday, tearful. HOH and responded to written communication.  PMHx:  STEMI, cervical CA, acute kidney injury, HTN, HOH.   Clinical Impression   Patient s/p above. Patient independent PTA, living with daughter. At discharge family states they will be able to provide 24/7 supervision. Patient will benefit from acute OT in order to increase independence with ADL tasks and overall functional mobility. Patient presents with decreased dynamic standing balance/tolerance/endurance, aphasia, decreased cognition, poor proprioception in RUE, decreased strength/coordination in RUE, decreased overall activity tolerance/endurance. Recommending CIR for continued rehab.     Follow Up Recommendations  CIR;SNF;Supervision/Assistance - 24 hour    Equipment Recommendations   (Defer to next venue)    Recommendations for Other Services Rehab consult     Precautions / Restrictions Precautions Precautions: Fall Restrictions Weight Bearing Restrictions: No      Mobility Bed Mobility Overal bed mobility: Needs Assistance Bed Mobility: Rolling;Sidelying to Sit Rolling: Min guard Sidelying to sit: Min guard          Transfers Overall transfer level: Needs assistance Equipment used: Rolling walker (2 wheeled) Transfers: Sit to/from Stand Sit to Stand: Min assist Stand pivot transfers: Min assist       General transfer comment: Visual cues for safety and direction were used as pt has trouble hearing verbal cues    Balance Overall balance assessment: Needs assistance Sitting-balance support: Bilateral upper  extremity supported;Feet supported Sitting balance-Leahy Scale: Fair   Postural control: Right lateral lean Standing balance support: Bilateral upper extremity supported Standing balance-Leahy Scale: Poor Standing balance comment: Pt is unsafe to stand alone as she cannot sequence with walker without help and uses visual cues to correct her safety                            ADL Overall ADL's : Needs assistance/impaired Eating/Feeding: Minimal assistance (requires min assist with bringing food>mouth) Eating/Feeding Details (indicate cue type and reason): tactile cueing Grooming: Min guard Grooming Details (indicate cue type and reason): Min guard for dynamic standing and hand over hand for actual grooming task due to poor proprioception Upper Body Bathing: Minimal assitance   Lower Body Bathing: Minimal assistance   Upper Body Dressing : Minimal assistance   Lower Body Dressing: Moderate assistance   Toilet Transfer: Minimal assistance   Toileting- Clothing Manipulation and Hygiene: Minimal assistance   Tub/ Shower Transfer: Minimal assistance   Functional mobility during ADLs: Minimal assistance General ADL Comments: Patient functioning at an overall min assist for functional tasks. Patient requires multimodal cues to complete tasks. Due to aphasia, patient does require tactile and visual cueing. Patient with decreased dynamic standing balance/tolerance/endurance and tends to fatigue easily. Patient with noted decreased proprioception and sensation > RUE, unable to fully assess due to aphasia/cognition.      Vision                 Additional Comments: Unable to fully asses secondary to aphasia/cognition   Perception Perception Perception Tested?: No   Praxis      Pertinent Vitals/Pain Pain Assessment: 0-10 Pain Score: 0-No pain (per patient report) Faces  Pain Scale: No hurt     Hand Dominance Right   Extremity/Trunk Assessment Upper Extremity  Assessment Upper Extremity Assessment: Generalized weakness;RUE deficits/detail;Difficult to assess due to impaired cognition (grossly 3/5 for RUE shoulder) RUE Deficits / Details: Patient with poor proprioception, coordination, fine/gross motor control in RUE RUE Sensation: decreased light touch;decreased proprioception (Unable to fully asses due to aphasia/cognition) RUE Coordination: decreased fine motor;decreased gross motor   Lower Extremity Assessment Lower Extremity Assessment: Defer to PT evaluation (poor proprioception noted during functional mobility/transfe)   Cervical / Trunk Assessment Cervical / Trunk Assessment: Kyphotic   Communication Communication Communication: HOH;Deaf;Receptive difficulties;Expressive difficulties   Cognition Arousal/Alertness: Awake/alert Behavior During Therapy: WFL for tasks assessed/performed Overall Cognitive Status: Impaired/Different from baseline Area of Impairment: Memory;Following commands;Safety/judgement;Awareness;Problem solving     Memory: Decreased short-term memory Following Commands: Follows multi-step commands inconsistently Safety/Judgement: Decreased awareness of safety Awareness: Intellectual Problem Solving: Slow processing;Requires verbal cues;Requires tactile cues (Patient requires tactile cues secondary to [facility])                Home Living Family/patient expects to be discharged to:: Unsure                                 Additional Comments: Family present and communicated with therapist. Patient unable fully understand questions or respond accurently      Prior Functioning/Environment Level of Independence: Independent        Comments: Patient's daughter states patient was perfoming all tasks independently    OT Diagnosis: Generalized weakness;Hemiplegia non-dominant side;Cognitive deficits (right hemiparesis)   OT Problem List: Decreased strength;Decreased activity tolerance;Impaired balance  (sitting and/or standing);Decreased coordination;Decreased cognition;Decreased safety awareness;Impaired sensation;Impaired UE functional use   OT Treatment/Interventions: Self-care/ADL training;Therapeutic exercise;Neuromuscular education;Energy conservation;Therapeutic activities;Cognitive remediation/compensation;Patient/family education;Balance training    OT Goals(Current goals can be found in the care plan section) Acute Rehab OT Goals Patient Stated Goal: none stated OT Goal Formulation: With patient/family Time For Goal Achievement:  (2 weeks) Potential to Achieve Goals: Good ADL Goals Pt Will Perform Eating: with supervision Pt Will Perform Grooming: with supervision Pt Will Perform Upper Body Bathing: with supervision Pt Will Perform Lower Body Bathing: with supervision Pt Will Transfer to Toilet: with supervision Additional ADL Goal #1: Patient will be educated on a RUE HEP to increase coordination and strength in order to increase independence with ADL/IADLs and for safety  OT Frequency: Min 2X/week   Barriers to D/C:  (none known at this time)             End of Session Equipment Utilized During Treatment: Gait belt;Rolling walker  Activity Tolerance: Patient limited by fatigue (Patient with increased frusteration secondary to aphasia) Patient left: in bed;with call bell/phone within reach;with family/visitor present   Time: 1324-1401 OT Time Calculation (min): 37 min Charges:  OT General Charges $OT Visit: 1 Procedure OT Evaluation $Initial OT Evaluation Tier I: 1 Procedure OT Treatments $Self Care/Home Management : 23-37 mins (27) G-Codes:    Joylyn Duggin, MS, OTR/L, CLT 03/06/2014, 2:42 PM

## 2014-03-06 NOTE — Plan of Care (Signed)
Problem: Acute Rehab PT Goals(only PT should resolve) Goal: Pt Will Go Supine/Side To Sit visually Goal: Pt Will Transfer Bed To Chair/Chair To Bed Visually and tactile cues Goal: Pt Will Ambulate Visually and tactile cues Goal: Pt Will Go Up/Down Stairs Verbal and tactile less than 25%.

## 2014-03-06 NOTE — Clinical Documentation Improvement (Signed)
Presents with an Acute CVA; right sided weakness is documented.  Please further qualify what right sided weakness means and document findings in next progress note and discharge summary if applicable.   Hemiplegia as late effect of Stroke  Hemiparesis as late effect of Stroke  Other condition  Thank you,  Zoila Shutter ,RN Clinical Documentation Specialist:  [facility] [employer]

## 2014-03-07 ENCOUNTER — Encounter (HOSPITAL_COMMUNITY): Payer: Self-pay | Admitting: Physical Medicine and Rehabilitation

## 2014-03-07 DIAGNOSIS — G8191 Hemiplegia, unspecified affecting right dominant side: Secondary | ICD-10-CM | POA: Diagnosis present

## 2014-03-07 DIAGNOSIS — E785 Hyperlipidemia, unspecified: Secondary | ICD-10-CM | POA: Diagnosis present

## 2014-03-07 DIAGNOSIS — I6330 Cerebral infarction due to thrombosis of unspecified cerebral artery: Secondary | ICD-10-CM

## 2014-03-07 MED ORDER — METOPROLOL TARTRATE 12.5 MG HALF TABLET
12.5000 mg | ORAL_TABLET | Freq: Two times a day (BID) | ORAL | Status: DC
  Administered 2014-03-07 – 2014-03-12 (×9): 12.5 mg via ORAL
  Filled 2014-03-07 (×10): qty 1

## 2014-03-07 MED ORDER — ASPIRIN 81 MG PO CHEW
81.0000 mg | CHEWABLE_TABLET | Freq: Every day | ORAL | Status: DC
  Administered 2014-03-10 – 2014-03-12 (×3): 81 mg via ORAL
  Filled 2014-03-07 (×3): qty 1

## 2014-03-07 MED ORDER — CLOPIDOGREL BISULFATE 75 MG PO TABS
75.0000 mg | ORAL_TABLET | Freq: Every day | ORAL | Status: DC
  Administered 2014-03-10 – 2014-03-12 (×3): 75 mg via ORAL
  Filled 2014-03-07 (×3): qty 1

## 2014-03-07 NOTE — Progress Notes (Signed)
  Date: 03/07/2014  Patient name: Traci Mclaughlin  Medical record number: [id number]  Date of birth: 1951-04-30   This patient has been seen and the plan of care was discussed with the house staff. Please see their note for complete details. I concur with their findings with the following additions/corrections:  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 She is also hearing impaired but does not have hearing aids. May benefit from audiology eval when she is in acute rehab. She would also benefit from speech/language pathology. Plan to get TEE and loop monitor in the next 1-2 days to see if stroke was due to cardiac embolic process. Appreciate neurology recommendations.  Carlyle Basques, MD 03/07/2014, 8:40 PM

## 2014-03-07 NOTE — Progress Notes (Signed)
Physical Therapy Treatment Patient Details Name: Traci Mclaughlin MRN: [id number] DOB: 07/04/1951 Today's Date: 03/07/2014    History of Present Illness 63 yo female with hx of MI 06/2011, HTN, cervical cancer here with right sided facial droop and slurred speech since yesterday, been complaining of LLE pain for past 1 month. Doppler was negative. She started to act differently since yesterday, tearful. HOH and responded to written communication.  PMHx:  STEMI, cervical CA, acute kidney injury, HTN, HOH.    PT Comments    Pt progressing well  Emphasized transfer, gait quality and safety as well as family education  Follow Up Recommendations  CIR     Equipment Recommendations  Rolling walker with 5" wheels;3in1 (PT)    Recommendations for Other Services Rehab consult     Precautions / Restrictions Precautions Precautions: Fall Restrictions Weight Bearing Restrictions: No    Mobility  Bed Mobility Overal bed mobility: Needs Assistance Bed Mobility: Supine to Sit;Sit to Supine     Supine to sit: Min guard Sit to supine: Min assist   General bed mobility comments: R side lags behind and is generally uncoordinated throughout  Transfers Overall transfer level: Needs assistance Equipment used: Rolling walker (2 wheeled) Transfers: Sit to/from Stand Sit to Stand: Min assist Stand pivot transfers: Min assist       General transfer comment: cues for safety and direction  Ambulation/Gait Ambulation/Gait assistance: Min assist Ambulation Distance (Feet): 130 Feet Assistive device: Rolling walker (2 wheeled) Gait Pattern/deviations: Step-through pattern;Wide base of support Gait velocity: slower Gait velocity interpretation: Below normal speed for age/gender General Gait Details: uncoordinated, unequal R step length with assist need to help w/shift to left   Stairs            Wheelchair Mobility    Modified Rankin (Stroke Patients Only) Modified Rankin  (Stroke Patients Only) Pre-Morbid Rankin Score: Slight disability Modified Rankin: Moderately severe disability     Balance Overall balance assessment: Needs assistance Sitting-balance support: Feet supported;No upper extremity supported Sitting balance-Leahy Scale: Fair       Standing balance-Leahy Scale: Poor                      Cognition Arousal/Alertness: Awake/alert Behavior During Therapy: WFL for tasks assessed/performed Overall Cognitive Status: Impaired/Different from baseline Area of Impairment: Following commands;Safety/judgement;Awareness;Problem solving       Following Commands: Follows one step commands with increased time;Follows one step commands consistently     Problem Solving: Slow processing;Requires verbal cues;Requires tactile cues (due to deafness)      Exercises  Warm up hip knee aa/resisted ROM bilaterally     General Comments        Pertinent Vitals/Pain Pain Assessment: 0-10 Faces Pain Scale: Hurts even more Pain Location: L knee pain    Home Living                      Prior Function            PT Goals (current goals can now be found in the care plan section) Acute Rehab PT Goals Patient Stated Goal: none stated PT Goal Formulation: Patient unable to participate in goal setting Time For Goal Achievement: 03/20/14 Potential to Achieve Goals: Good Progress towards PT goals: Progressing toward goals    Frequency  Min 4X/week    PT Plan Current plan remains appropriate    Co-evaluation             End of  Session   Activity Tolerance: Patient limited by fatigue Patient left: in chair;with call bell/phone within reach;with nursing/sitter in room;Other (comment)     Time: 6301-6010 PT Time Calculation (min): 29 min  Charges:  $Gait Training: 8-22 mins $Therapeutic Activity: 8-22 mins                    G Codes:      Heavenleigh Petruzzi, Tessie Fass 03/07/2014, 11:05 AM 03/07/2014  Donnella Sham,  PT [phone]  (pager)

## 2014-03-07 NOTE — Progress Notes (Addendum)
Inpatient Rehabilitation  I met with the patient and her daughter Traci Mclaughlin [phone]) at the bediside to discuss pt's post acute rehab needs.  Pt. and daughter in favor of IP rehab and daughter states she will not have her mom go to a nursing home.  I will initiate insurance authorization process and will update the acute team when I hear something.  My co-worker Danne Baxter will follow up tomorrow.  Please call her at [phone] for any questions in my absence the rest of the week.    [facility] Admissions Coordinator Cell [phone] Office [phone]

## 2014-03-07 NOTE — Progress Notes (Signed)
Speech Language Pathology Treatment: Cognitive-Linquistic;Dysphagia  Patient Details Name: Traci Mclaughlin MRN: [id number] DOB: [birth year] Today's Date: 03/07/2014 Time: 4388-8757 SLP Time Calculation (min): 40 min  Assessment / Plan / Recommendation Clinical Impression  Treatment focused on cognitive-linguistic and swallowing goals. SLP provided skilled observation and Min tactile and visual cueing for smaller straw sips. No overt s/s of aspiration were observed, although pt did exhibit anterior loss and impaired mastication and posterior transit with advanced Dys 3 textures. Recommend to continue current diet.  SLP also provided Max cues for attempts to utilize various alternative communication methods in order to facilitate functional communication. Per daughter, pt's baseline hearing abilities are minimal, and she would rely on lipreading and reading texts. Pt read and completed one-step instructions with Mod visual/tactile cueing from SLP. Two-step instructions required Max multimodal cueing from therapist. SLP attempted use of communication board, however pt was unable to functionally identify pictures despite Max A. Recommend to continue utilizing visual/gestural cues primarily, with brief written instructions as needed; SLP posted sign at [facility] to facilitate communication with staff.   HPI HPI: Traci Mclaughlin is a 62 y.o. female who presented 03/05/14 with right-sided weakness, slurred speech, and difficulty speaking x1 day. MRI revealed a significant left MCA infarction.   Pertinent Vitals Pain Assessment: Faces Faces Pain Scale: No hurt  SLP Plan  Continue with current plan of care    Recommendations Diet recommendations: Dysphagia 2 (fine chop);Thin liquid Liquids provided via: Cup;Straw Medication Administration: Whole meds with puree Supervision: Patient able to self feed;Full supervision/cueing for compensatory strategies Compensations: Slow rate;Small sips/bites;Check for  pocketing Postural Changes and/or Swallow Maneuvers: Seated upright 90 degrees              Oral Care Recommendations: Oral care BID Follow up Recommendations: Inpatient Rehab;24 hour supervision/assistance Plan: Continue with current plan of care    GO      Germain Osgood, M.A. CCC-SLP [phone]  Germain Osgood 03/07/2014, 4:57 PM

## 2014-03-07 NOTE — Progress Notes (Signed)
DAILY PROGRESS NOTE  Subjective:  No chest pain.  Echo yesterday shows EF of 55%, however, there are wall motion abnormalities, specifically basal inferior and inferolateral hypokinesis with mid to apical inferolateral akinesis. 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  Objective:  Temp:  [98 F (36.7 C)-99.8 F (37.7 C)] 98.6 F (37 C) (08/12 1011) Pulse Rate:  [81-92] 84 (08/12 1011) Resp:  [18-20] 20 (08/12 1011) BP: (127-159)/(74-89) 138/75 mmHg (08/12 1011) SpO2:  [93 %-99 %] 98 % (08/12 1011) Weight change:   Intake/Output from previous day:    Intake/Output from this shift:    Medications: Current Facility-Administered Medications  Medication Dose Route Frequency Provider Last Rate Last Dose  .  stroke: mapping our early stages of recovery book   Does not apply Once Wilber Oliphant, MD      . aspirin suppository 300 mg  300 mg Rectal Daily Wilber Oliphant, MD   300 mg at 03/06/14 1051   Or  . aspirin tablet 325 mg  325 mg Oral Daily Wilber Oliphant, MD   325 mg at 03/07/14 1002  . atorvastatin (LIPITOR) tablet 40 mg  40 mg Oral q1800 Pixie Casino, MD   40 mg at 03/06/14 1802    Physical Exam: General appearance: alert and no distress Lungs: clear to auscultation bilaterally Heart: regular rate and rhythm, S1, S2 normal, no murmur, click, rub or gallop Extremities: extremities normal, atraumatic, no cyanosis or edema Neurologic: Mental status: Alert, expressive aphasia  Lab Results: Results for orders placed during the hospital encounter of 03/05/14 (from the past 48 hour(s))  CBC     Status: None   Collection Time    03/05/14  1:32 PM      Result Value Ref Range   WBC 9.8  4.0 - 10.5 K/uL   Comment: WHITE COUNT CONFIRMED ON SMEAR   RBC 4.43  3.87 - 5.11 MIL/uL   Hemoglobin 14.9  12.0 - 15.0 g/dL   HCT 43.6  36.0 - 46.0 %   MCV 98.4  78.0 - 100.0 fL   MCH 33.6  26.0 - 34.0 pg   MCHC 34.2  30.0 - 36.0 g/dL   RDW 12.8  11.5 -  15.5 %   Platelets PLATELETS APPEAR DECREASED  150 - 400 K/uL  DIFFERENTIAL     Status: None   Collection Time    03/05/14  1:32 PM      Result Value Ref Range   Neutrophils Relative % 77  43 - 77 %   Lymphocytes Relative 15  12 - 46 %   Monocytes Relative 7  3 - 12 %   Eosinophils Relative 1  0 - 5 %   Basophils Relative 0  0 - 1 %   Neutro Abs 7.5  1.7 - 7.7 K/uL   Lymphs Abs 1.5  0.7 - 4.0 K/uL   Monocytes Absolute 0.7  0.1 - 1.0 K/uL   Eosinophils Absolute 0.1  0.0 - 0.7 K/uL   Basophils Absolute 0.0  0.0 - 0.1 K/uL   Smear Review       Value: PLATELET CLUMPS NOTED ON SMEAR, COUNT APPEARS DECREASED  CBG MONITORING, ED     Status: Abnormal   Collection Time    03/05/14  2:26 PM      Result Value Ref Range   Glucose-Capillary 101 (*) 70 - 99 mg/dL  PROTIME-INR     Status: None   Collection Time  03/05/14  3:08 PM      Result Value Ref Range   Prothrombin Time 13.9  11.6 - 15.2 seconds   INR 1.07  0.00 - 1.49  APTT     Status: Abnormal   Collection Time    03/05/14  3:08 PM      Result Value Ref Range   aPTT 40 (*) 24 - 37 seconds   Comment:            IF BASELINE aPTT IS ELEVATED,     SUGGEST PATIENT RISK ASSESSMENT     BE USED TO DETERMINE APPROPRIATE     ANTICOAGULANT THERAPY.  COMPREHENSIVE METABOLIC PANEL     Status: Abnormal   Collection Time    03/05/14  3:08 PM      Result Value Ref Range   Sodium 140  137 - 147 mEq/L   Potassium 4.1  3.7 - 5.3 mEq/L   Chloride 102  96 - 112 mEq/L   CO2 22  19 - 32 mEq/L   Glucose, Bld 93  70 - 99 mg/dL   BUN 14  6 - 23 mg/dL   Creatinine, Ser 1.10  0.50 - 1.10 mg/dL   Calcium 9.2  8.4 - 10.5 mg/dL   Total Protein 7.4  6.0 - 8.3 g/dL   Albumin 3.8  3.5 - 5.2 g/dL   AST 24  0 - 37 U/L   ALT 15  0 - 35 U/L   Alkaline Phosphatase 60  39 - 117 U/L   Total Bilirubin 0.5  0.3 - 1.2 mg/dL   GFR calc non Af Amer 52 (*) >90 mL/min   GFR calc Af Amer 61 (*) >90 mL/min   Comment: (NOTE)     The eGFR has been calculated  using the CKD EPI equation.     This calculation has not been validated in all clinical situations.     eGFR's persistently <90 mL/min signify possible Chronic Kidney     Disease.   Anion gap 16 (*) 5 - 15  I-STAT TROPOININ, ED     Status: Abnormal   Collection Time    03/05/14  3:45 PM      Result Value Ref Range   Troponin i, poc 0.23 (*) 0.00 - 0.08 ng/mL   Comment NOTIFIED PHYSICIAN     Comment 3            Comment: Due to the release kinetics of cTnI,     a negative result within the first hours     of the onset of symptoms does not rule out     myocardial infarction with certainty.     If myocardial infarction is still suspected,     repeat the test at appropriate intervals.  HIV ANTIBODY (ROUTINE TESTING)     Status: None   Collection Time    03/05/14  5:10 PM      Result Value Ref Range   HIV 1&2 Ab, 4th Generation NONREACTIVE  NONREACTIVE   Comment: (NOTE)     A NONREACTIVE HIV Ag/Ab result does not exclude HIV infection since     the time frame for seroconversion is variable. If acute HIV infection     is suspected, a HIV-1 RNA Qualitative TMA test is recommended.     HIV-1/2 Antibody Diff         Not indicated.     HIV-1 RNA, Qual TMA           Not indicated.  PLEASE NOTE: This information has been disclosed to you from records     whose confidentiality may be protected by state law. If your state     requires such protection, then the state law prohibits you from making     any further disclosure of the information without the specific written     consent of the person to whom it pertains, or as otherwise permitted     by law. A general authorization for the release of medical or other     information is NOT sufficient for this purpose.     The performance of this assay has not been clinically validated in     patients less than 21 years old.     Performed at [facility]  TROPONIN I     Status: Abnormal   Collection Time    03/05/14  5:25 PM       Result Value Ref Range   Troponin I 0.37 (*) <0.30 ng/mL   Comment:            Due to the release kinetics of cTnI,     a negative result within the first hours     of the onset of symptoms does not rule out     myocardial infarction with certainty.     If myocardial infarction is still suspected,     repeat the test at appropriate intervals.     CRITICAL RESULT CALLED TO, READ BACK BY AND VERIFIED WITH:     E ALI,RN 1826 03/05/14 WBOND  URINALYSIS, ROUTINE W REFLEX MICROSCOPIC     Status: Abnormal   Collection Time    03/05/14 10:37 PM      Result Value Ref Range   Color, Urine YELLOW  YELLOW   APPearance CLEAR  CLEAR   Specific Gravity, Urine 1.010  1.005 - 1.030   pH 7.5  5.0 - 8.0   Glucose, UA NEGATIVE  NEGATIVE mg/dL   Hgb urine dipstick NEGATIVE  NEGATIVE   Bilirubin Urine NEGATIVE  NEGATIVE   Ketones, ur 15 (*) NEGATIVE mg/dL   Protein, ur NEGATIVE  NEGATIVE mg/dL   Urobilinogen, UA 1.0  0.0 - 1.0 mg/dL   Nitrite NEGATIVE  NEGATIVE   Leukocytes, UA NEGATIVE  NEGATIVE   Comment: MICROSCOPIC NOT DONE ON URINES WITH NEGATIVE PROTEIN, BLOOD, LEUKOCYTES, NITRITE, OR GLUCOSE <1000 mg/dL.  URINE RAPID DRUG SCREEN (HOSP PERFORMED)     Status: Abnormal   Collection Time    03/05/14 10:37 PM      Result Value Ref Range   Opiates NONE DETECTED  NONE DETECTED   Cocaine NONE DETECTED  NONE DETECTED   Benzodiazepines NONE DETECTED  NONE DETECTED   Amphetamines NONE DETECTED  NONE DETECTED   Tetrahydrocannabinol POSITIVE (*) NONE DETECTED   Barbiturates NONE DETECTED  NONE DETECTED   Comment:            DRUG SCREEN FOR MEDICAL PURPOSES     ONLY.  IF CONFIRMATION IS NEEDED     FOR ANY PURPOSE, NOTIFY LAB     WITHIN 5 DAYS.                LOWEST DETECTABLE LIMITS     FOR URINE DRUG SCREEN     Drug Class       Cutoff (ng/mL)     Amphetamine      1000     Barbiturate      200     Benzodiazepine  583     Tricyclics       094     Opiates          300     Cocaine          300       THC              50  LIPID PANEL     Status: Abnormal   Collection Time    03/05/14 10:40 PM      Result Value Ref Range   Cholesterol 191  0 - 200 mg/dL   Triglycerides 106  <150 mg/dL   HDL 48  >39 mg/dL   Total CHOL/HDL Ratio 4.0     VLDL 21  0 - 40 mg/dL   LDL Cholesterol 122 (*) 0 - 99 mg/dL   Comment:            Total Cholesterol/HDL:CHD Risk     Coronary Heart Disease Risk Table                         Men   Women      1/2 Average Risk   3.4   3.3      Average Risk       5.0   4.4      2 X Average Risk   9.6   7.1      3 X Average Risk  23.4   11.0                Use the calculated Patient Ratio     above and the CHD Risk Table     to determine the patient's CHD Risk.                ATP III CLASSIFICATION (LDL):      <100     mg/dL   Optimal      100-129  mg/dL   Near or Above                        Optimal      130-159  mg/dL   Borderline      160-189  mg/dL   High      >190     mg/dL   Very High  TROPONIN I     Status: Abnormal   Collection Time    03/05/14 10:45 PM      Result Value Ref Range   Troponin I 0.57 (*) <0.30 ng/mL   Comment:            Due to the release kinetics of cTnI,     a negative result within the first hours     of the onset of symptoms does not rule out     myocardial infarction with certainty.     If myocardial infarction is still suspected,     repeat the test at appropriate intervals.     CRITICAL VALUE NOTED.  VALUE IS CONSISTENT WITH PREVIOUSLY REPORTED AND CALLED VALUE.  HEMOGLOBIN A1C     Status: Abnormal   Collection Time    03/05/14 10:45 PM      Result Value Ref Range   Hemoglobin A1C 5.8 (*) <5.7 %   Comment: (NOTE)  According to the ADA Clinical Practice Recommendations for 2011, when     HbA1c is used as a screening test:      >=6.5%   Diagnostic of Diabetes Mellitus               (if abnormal result is confirmed)     5.7-6.4%   Increased  risk of developing Diabetes Mellitus     References:Diagnosis and Classification of Diabetes Mellitus,Diabetes     YOVZ,8588,50(YDXAJ 1):S62-S69 and Standards of Medical Care in             Diabetes - 2011,Diabetes OINO,6767,20 (Suppl 1):S11-S61.   Mean Plasma Glucose 120 (*) <117 mg/dL   Comment: Performed at [facility]  TROPONIN I     Status: Abnormal   Collection Time    03/06/14  5:10 AM      Result Value Ref Range   Troponin I 0.43 (*) <0.30 ng/mL   Comment:            Due to the release kinetics of cTnI,     a negative result within the first hours     of the onset of symptoms does not rule out     myocardial infarction with certainty.     If myocardial infarction is still suspected,     repeat the test at appropriate intervals.     CRITICAL VALUE NOTED.  VALUE IS CONSISTENT WITH PREVIOUSLY REPORTED AND CALLED VALUE.    Imaging: Ct Head (brain) Wo Contrast  03/05/2014   CLINICAL DATA:  Facial droop and dysarthria ; right-sided weakness  EXAM: CT HEAD WITHOUT CONTRAST  TECHNIQUE: Contiguous axial images were obtained from the base of the skull through the vertex without intravenous contrast.  COMPARISON:  None.  FINDINGS: The ventricles are normal in size and configuration. There is no demonstrable mass, hemorrhage, extra-axial fluid collection, or midline shift. There is decreased attenuation in the superior left temporal lobe with involvement of the left extreme capsule and insular cortex as well consistent with an acute infarct with focal cytotoxic edema in this area. This recent appearing infarct extends more superiorly to involve a portion of the posterior left frontal lobe.  Bony calvarium appears intact. The mastoid air cells are clear. There is nasal turbinate edema bilaterally. There is also decreased attenuation in a portion of the medial left occipital lobe which appears more chronic and may represent an older infarct. Elsewhere gray-white compartments appear  unremarkable.  IMPRESSION: Acute infarct involving portions of the superior left temporal lobe as well as portions of the posterior left frontal lobe. There is also involvement of the left extreme capsule and insular cortex. There is cytotoxic edema in these areas.  There is decreased attenuation in the medial left occipital lobe with sparing of the post rib medial aspect of the left occipital lobe. Suspect older infarct in this area. There is no acute hemorrhage. There is no mass or midline shift. There is diffuse nasal turbinate edema bilaterally.   Electronically Signed   By: Lowella Grip M.D.   On: 03/05/2014 14:29   Mr Brain Wo Contrast  03/06/2014   CLINICAL DATA:  Right-sided weakness, speech difficulty. Acute left middle cerebral artery territory infarct.  EXAM: MRI HEAD WITHOUT CONTRAST  MRA HEAD WITHOUT CONTRAST  TECHNIQUE: Multiplanar, multiecho pulse sequences of the brain and surrounding structures were obtained without intravenous contrast. Angiographic images of the head were obtained using MRA technique without contrast.  COMPARISON:  CT of the head March 05, 2014 at 1408 hr  FINDINGS: MRI HEAD FINDINGS  Reduced diffusion within left frontal lobe with corresponding low ADC values. Mild expansile T2 hyperintense signal within the corresponding cortex. Local mass effect without midline shift. No susceptibility artifact to suggest hemorrhagic conversion.  Left mesial occipital lobe transcortical encephalomalacia with mild ex vacuo dilatation of the left occipital horn. The ventricles and sulci are otherwise normal for patient's age. A few scattered sub cm supratentorial white matter T2 hyperintensities, exclusive of the aforementioned acute does infarct suggests chronic small vessel ischemic disease.  No abnormal extra-axial fluid collections. No abnormal sellar expansion. No cerebellar tonsillar ectopia. Trace paranasal sinus mucosal thickening with small right maxillary mucous retention cyst,  the mastoid air cells appear well-aerated. Ocular globes and orbital contents are unremarkable. No suspicious calvarial bone marrow signal.  MRA HEAD FINDINGS  Anterior circulation: Normal flow related and enhancement of the included cervical, petrous, cavernous and supra clinoid internal carotid arteries. Normal flow related enhancement of the left M1 segment and left middle cerebral artery bifurcation. Meniscus and occlusion of left superior M2 branch, axial 42/126. Normal flow related enhancement of the anterior cerebral arteries and right middle cerebral artery.  Posterior circulation: Left vertebral artery is dominant. Normal flow related enhancement of vertebral basilar system, patent main branch vessels. Small right posterior communicating artery present. Thready left P2 segment with absent left P3 flow related enhancement.  No aneurysm.  IMPRESSION: MRI head: Acute large left middle cerebral artery territory infarct.  Remote left posterior cerebral artery territory infarct.  MRA head: Occluded left M2 superior branch consistent with acute thromboembolic disease.  Occluded versus less likely slow flow left P3 segment, thready left P2 segment.   Electronically Signed   By: Elon Alas   On: 03/06/2014 01:00   Dg Chest Port 1 View  03/05/2014   CLINICAL DATA:  LEFT side stroke, weakness, slurred speech, past history MI, cervical cancer, hypertension  EXAM: PORTABLE CHEST - 1 VIEW  COMPARISON:  Portable exam 1455 hr compared to 07/03/2011  FINDINGS: Upper normal heart size.  Normal mediastinal contours and pulmonary vascularity.  Slight rotation to the LEFT.  Lungs grossly clear.  No pleural effusion, pneumothorax or acute osseous findings.  IMPRESSION: No acute abnormalities.   Electronically Signed   By: Lavonia Dana M.D.   On: 03/05/2014 15:01   Mr Jodene Nam Head/brain Wo Cm  03/06/2014   CLINICAL DATA:  Right-sided weakness, speech difficulty. Acute left middle cerebral artery territory infarct.  EXAM:  MRI HEAD WITHOUT CONTRAST  MRA HEAD WITHOUT CONTRAST  TECHNIQUE: Multiplanar, multiecho pulse sequences of the brain and surrounding structures were obtained without intravenous contrast. Angiographic images of the head were obtained using MRA technique without contrast.  COMPARISON:  CT of the head March 05, 2014 at 1408 hr  FINDINGS: MRI HEAD FINDINGS  Reduced diffusion within left frontal lobe with corresponding low ADC values. Mild expansile T2 hyperintense signal within the corresponding cortex. Local mass effect without midline shift. No susceptibility artifact to suggest hemorrhagic conversion.  Left mesial occipital lobe transcortical encephalomalacia with mild ex vacuo dilatation of the left occipital horn. The ventricles and sulci are otherwise normal for patient's age. A few scattered sub cm supratentorial white matter T2 hyperintensities, exclusive of the aforementioned acute does infarct suggests chronic small vessel ischemic disease.  No abnormal extra-axial fluid collections. No abnormal sellar expansion. No cerebellar tonsillar ectopia. Trace paranasal sinus mucosal thickening with small right maxillary mucous retention cyst, the mastoid air cells appear  well-aerated. Ocular globes and orbital contents are unremarkable. No suspicious calvarial bone marrow signal.  MRA HEAD FINDINGS  Anterior circulation: Normal flow related and enhancement of the included cervical, petrous, cavernous and supra clinoid internal carotid arteries. Normal flow related enhancement of the left M1 segment and left middle cerebral artery bifurcation. Meniscus and occlusion of left superior M2 branch, axial 42/126. Normal flow related enhancement of the anterior cerebral arteries and right middle cerebral artery.  Posterior circulation: Left vertebral artery is dominant. Normal flow related enhancement of vertebral basilar system, patent main branch vessels. Small right posterior communicating artery present. Thready left P2  segment with absent left P3 flow related enhancement.  No aneurysm.  IMPRESSION: MRI head: Acute large left middle cerebral artery territory infarct.  Remote left posterior cerebral artery territory infarct.  MRA head: Occluded left M2 superior branch consistent with acute thromboembolic disease.  Occluded versus less likely slow flow left P3 segment, thready left P2 segment.   Electronically Signed   By: Elon Alas   On: 03/06/2014 01:00    Assessment:  Principal Problem:   Stroke Active Problems:   Elevation of cardiac enzymes   CKD (chronic kidney disease) stage 3, GFR 30-59 ml/min   Hypertension   NSTEMI (non-ST elevated myocardial infarction)   CVA (cerebral infarction)   Right hemiplegia   Plan:  Echo demonstrates wall motion abnormalities consistent with infarct/ischemia. She is not complaining of chest pain. There are no signs of ongoing ischemia. Given her large recent stroke, cardiac catheterization is too high risk at this time and the risk/benefit ratio is not favorable for this. I would recommend medical therapy. Would consider an outpatient lexiscan myoview after discharge to evaluate for any potential reversible ischemia. High risk findings would probably warrant a catheterization, but likely at least 1 month after her stroke. D/w Dr. Leonie Man, he is okay with adding plavix dependent on her TEE results. He would like her to have TEE/Loop recorder tomorrow. Also ok to add b-blocker from his standpoint.  Time Spent Directly with Patient:  15 minutes  Length of Stay:  LOS: 2 days   Pixie Casino, MD, [facility] Attending Cardiologist [facility]  HILTY,Kenneth C 03/07/2014, 11:19 AM

## 2014-03-07 NOTE — Consult Note (Signed)
Physical Medicine and Rehabilitation Consult  Reason for Consult:  Difficulty speaking Referring Physician: Dr. Baxter Flattery   HPI: Traci Mclaughlin is a 63 y.o. female with history of HTN, HOH, cervical cancer; who was admitted on 03/05/14 with right sided weakness, right facial droop and difficulty speaking X 1 day.  Family was able to convince her to come to treat treatment and CT head revealed acute infarct superior left temporal lobe, portions of the posterior left frontal lobe, left extreme capsule and insular cortex with cytotoxic edema. 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 2D echo with EF 55% with basal inferior and inferior lateral hypokinesis and mid to apical inferolateral akinesis. Patient with elevated cardiac enzymes and cardiology felt changes due to [facility] stroke. Carotid dopplers without significant ICA stenosis. 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 MD and rehab team recommending CIR for further therapies.    Daughter states her mom is HOH  Review of Systems  Unable to perform ROS: language     Past Medical History  Diagnosis Date  . Cancer     cervical; was on chemo; has been in remission for 52yrs  . STEMI (ST elevation myocardial infarction) 06/2011  . Acute kidney injury 07/06/2011  . Hypertension   . HOH (hard of hearing)    Past Surgical History  Procedure Laterality Date  . Cardiac catheterization  07/03/2011    Dr. Irish Lack   Family History  Problem Relation Age of Onset  . Intracerebral hemorrhage Daughter     during childbirth    Social History:  Lives with family.  Multiple extended family members at home can provide supervision. Per reports that she has quit smoking. Her smoking use included Cigarettes. She has a 1 pack-year smoking history. She quit smokeless tobacco use about 2 years ago. Per reports that she drinks wine occasionally. Per reports that she does not use illicit drugs.   Allergies: No Known Allergies   Medications Prior to Admission  Medication Sig Dispense Refill  . amLODipine (NORVASC) 5 MG tablet Take 5 mg by mouth daily.      . diclofenac sodium (VOLTAREN) 1 % GEL Apply 4 g topically 4 (four) times daily.      Marland Kitchen HYDROcodone-acetaminophen (NORCO/VICODIN) 5-325 MG per tablet Take 1 tablet by mouth every 6 (six) hours as needed for moderate pain.        Home: Home Living Family/patient expects to be discharged to:: Unsure Additional Comments: Family present and communicated with therapist. Patient unable fully understand questions or respond accurently  Functional History: Prior Function Level of Independence: Independent Comments: Patient's daughter states patient was perfoming all tasks independently Functional Status:  Mobility: Bed Mobility Overal bed mobility: Needs Assistance Bed Mobility: Rolling;Sidelying to Sit Rolling: Min guard Sidelying to sit: Min guard Transfers Overall transfer level: Needs assistance Equipment used: Rolling walker (2 wheeled) Transfers: Sit to/from Stand Sit to Stand: Min assist Stand pivot transfers: Min assist General transfer comment: Visual cues for safety and direction were used as pt has trouble hearing verbal cues Ambulation/Gait Ambulation/Gait assistance: Min assist Ambulation Distance (Feet): 50 Feet Assistive device: Rolling walker (2 wheeled) Gait Pattern/deviations: Step-through pattern;Decreased weight shift to right;Ataxic;Decreased dorsiflexion - right;Decreased step length -  right;Decreased step length - left (R knee buckling when tired) Gait velocity: reduced Gait  velocity interpretation: Below normal speed for age/gender General Gait Details: flexed posture on RW with min assist on R from PT, close guarding of chair.  Pt demonstrates shorter steps with wide base, buckling on R knee until PT used visual cues to slow down then sit.  Turns with difficulty and needs assist to decide limits    ADL: ADL Overall ADL's : Needs assistance/impaired Eating/Feeding: Minimal assistance (requires min assist with bringing food>mouth) Eating/Feeding Details (indicate cue type and reason): tactile cueing Grooming: Min guard Grooming Details (indicate cue type and reason): Min guard for dynamic standing and hand over hand for actual grooming task due to poor proprioception Upper Body Bathing: Minimal assitance Lower Body Bathing: Minimal assistance Upper Body Dressing : Minimal assistance Lower Body Dressing: Moderate assistance Toilet Transfer: Minimal assistance Toileting- Clothing Manipulation and Hygiene: Minimal assistance Tub/ Shower Transfer: Minimal assistance Functional mobility during ADLs: Minimal assistance General ADL Comments: Patient functioning at an overall min assist for functional tasks. Patient requires multimodal cues to complete tasks. Due to aphasia, patient does require tactile and visual cueing. Patient with decreased dynamic standing balance/tolerance/endurance and tends to fatigue easily. Patient with noted decreased proprioception and sensation > RUE, unable to fully assess due to aphasia/cognition.   Cognition: Cognition Overall Cognitive Status: Impaired/Different from baseline Arousal/Alertness: Awake/alert Orientation Level: Other (comment) (uta due to aphasia) Attention: Sustained Sustained Attention: Appears intact Awareness:  (emergent awareness of linguistic errors appears intact) Behaviors: Poor frustration tolerance Cognition Arousal/Alertness: Awake/alert Behavior During Therapy: WFL for tasks assessed/performed Overall  Cognitive Status: Impaired/Different from baseline Area of Impairment: Memory;Following commands;Safety/judgement;Awareness;Problem solving Memory: Decreased short-term memory Following Commands: Follows multi-step commands inconsistently Safety/Judgement: Decreased awareness of safety Awareness: Intellectual Problem Solving: Slow processing;Requires verbal cues;Requires tactile cues (Patient requires tactile cues secondary to [facility]) Difficult to assess due to: Hard of hearing/deaf;Impaired communication  Blood pressure 159/89, pulse 90, temperature 98.3 F (36.8 C), temperature source Oral, resp. rate 18, height 5\' 3"  (1.6 m), SpO2 96.00%. Physical Exam  Vitals reviewed. Constitutional: She appears well-developed and well-nourished.  HENT:  Head: Normocephalic and atraumatic.  Eyes: Conjunctivae are normal. Pupils are equal, round, and reactive to light.  Neck: Normal range of motion. Neck supple.  Cardiovascular: Regular rhythm.  Tachycardia present.   Respiratory: Effort normal and breath sounds normal. No respiratory distress.  GI: Soft. Bowel sounds are normal. She exhibits no distension.  Musculoskeletal: She exhibits no edema.  Left knee discomfort with ROM.  Neurological: She is alert.  Anxious appearing. Unable to answer simple biographic Y/N question. Dysarthric speech. Fluent aphasia with verbal output limited to "I don't know", "my daughter is upstairs" (grandaughter came in towards end of exam and reported that pt's daughter having surgery today). Unable to read and follow instructions. Needs visual cues to follow simple commands. Right sided weakness noted.    Skin: Skin is warm and dry.  Psychiatric: Her mood appears anxious. Her speech is slurred. She is slowed. She is inattentive.  3+/5 R delt, bi, tri, grip 4/5 R HF, KE, ADF/APF  5/5 on Left side  No results found for this or any previous visit (from the past 24 hour(s)). Ct Head (brain) Wo Contrast  03/05/2014    CLINICAL DATA:  Facial droop and dysarthria ; right-sided weakness  EXAM: CT HEAD WITHOUT CONTRAST  TECHNIQUE: Contiguous axial images were obtained from the base of the skull through the vertex without intravenous contrast.  COMPARISON:  None.  FINDINGS: The ventricles are normal in size and configuration. There is no demonstrable mass, hemorrhage, extra-axial fluid collection, or midline shift. There is decreased attenuation in the superior left temporal lobe with involvement of the left extreme capsule and insular cortex as well consistent with an acute infarct with focal cytotoxic edema in this area. This recent appearing infarct extends more superiorly to involve a portion of the posterior left frontal lobe.  Bony calvarium appears intact. The mastoid air cells are clear. There is nasal turbinate edema bilaterally. There is also decreased attenuation in a portion of the medial left occipital lobe which appears more chronic and may represent an older infarct. Elsewhere gray-white compartments appear unremarkable.  IMPRESSION: Acute infarct involving portions of the superior left temporal lobe as well as portions of the posterior left frontal lobe. There is also involvement of the left extreme capsule and insular cortex. There is cytotoxic edema in these areas.  There is decreased attenuation in the medial left occipital lobe with sparing of the post rib medial aspect of the left occipital lobe. Suspect older infarct in this area. There is no acute hemorrhage. There is no mass or midline shift. There is diffuse nasal turbinate edema bilaterally.   Electronically Signed   By: Lowella Grip M.D.   On: 03/05/2014 14:29   Mr Brain Wo Contrast  03/06/2014   CLINICAL DATA:  Right-sided weakness, speech difficulty. Acute left middle cerebral artery territory infarct.  EXAM: MRI HEAD WITHOUT CONTRAST  MRA HEAD WITHOUT CONTRAST  TECHNIQUE: Multiplanar, multiecho pulse sequences of the brain and surrounding  structures were obtained without intravenous contrast. Angiographic images of the head were obtained using MRA technique without contrast.  COMPARISON:  CT of the head March 05, 2014 at 1408 hr  FINDINGS: MRI HEAD FINDINGS  Reduced diffusion within left frontal lobe with corresponding low ADC values. Mild expansile T2 hyperintense signal within the corresponding cortex. Local mass effect without midline shift. No susceptibility artifact to suggest hemorrhagic conversion.  Left mesial occipital lobe transcortical encephalomalacia with mild ex vacuo dilatation of the left occipital horn. The ventricles and sulci are otherwise normal for patient's age. A few scattered sub cm supratentorial white matter T2 hyperintensities, exclusive of the aforementioned acute does infarct suggests chronic small vessel ischemic disease.  No abnormal extra-axial fluid collections. No abnormal sellar expansion. No cerebellar tonsillar ectopia. Trace paranasal sinus mucosal thickening with small right maxillary mucous retention cyst, the mastoid air cells appear well-aerated. Ocular globes and orbital contents are unremarkable. No suspicious calvarial bone marrow signal.  MRA HEAD FINDINGS  Anterior circulation: Normal flow related and enhancement of the included cervical, petrous, cavernous and supra clinoid internal carotid arteries. Normal flow related enhancement of the left M1 segment and left middle cerebral artery bifurcation. Meniscus and occlusion of left superior M2 branch, axial 42/126. Normal flow related enhancement of the anterior cerebral arteries and right middle cerebral artery.  Posterior circulation: Left vertebral artery is dominant. Normal flow related enhancement of vertebral basilar system, patent main branch vessels. Small right posterior communicating artery present. Thready left P2 segment with absent left P3 flow related enhancement.  No aneurysm.  IMPRESSION: MRI head: Acute large left middle cerebral artery  territory infarct.  Remote left posterior cerebral artery territory infarct.  MRA head: Occluded left M2 superior branch consistent with acute thromboembolic disease.  Occluded versus less likely slow flow left P3 segment, thready left P2 segment.   Electronically Signed   By: Sandie Ano  Bloomer   On: 03/06/2014 01:00   Dg Chest Port 1 View  03/05/2014   CLINICAL DATA:  LEFT side stroke, weakness, slurred speech, past history MI, cervical cancer, hypertension  EXAM: PORTABLE CHEST - 1 VIEW  COMPARISON:  Portable exam 1455 hr compared to 07/03/2011  FINDINGS: Upper normal heart size.  Normal mediastinal contours and pulmonary vascularity.  Slight rotation to the LEFT.  Lungs grossly clear.  No pleural effusion, pneumothorax or acute osseous findings.  IMPRESSION: No acute abnormalities.   Electronically Signed   By: Lavonia Dana M.D.   On: 03/05/2014 15:01   Mr Jodene Nam Head/brain Wo Cm  03/06/2014   CLINICAL DATA:  Right-sided weakness, speech difficulty. Acute left middle cerebral artery territory infarct.  EXAM: MRI HEAD WITHOUT CONTRAST  MRA HEAD WITHOUT CONTRAST  TECHNIQUE: Multiplanar, multiecho pulse sequences of the brain and surrounding structures were obtained without intravenous contrast. Angiographic images of the head were obtained using MRA technique without contrast.  COMPARISON:  CT of the head March 05, 2014 at 1408 hr  FINDINGS: MRI HEAD FINDINGS  Reduced diffusion within left frontal lobe with corresponding low ADC values. Mild expansile T2 hyperintense signal within the corresponding cortex. Local mass effect without midline shift. No susceptibility artifact to suggest hemorrhagic conversion.  Left mesial occipital lobe transcortical encephalomalacia with mild ex vacuo dilatation of the left occipital horn. The ventricles and sulci are otherwise normal for patient's age. A few scattered sub cm supratentorial white matter T2 hyperintensities, exclusive of the aforementioned acute does infarct  suggests chronic small vessel ischemic disease.  No abnormal extra-axial fluid collections. No abnormal sellar expansion. No cerebellar tonsillar ectopia. Trace paranasal sinus mucosal thickening with small right maxillary mucous retention cyst, the mastoid air cells appear well-aerated. Ocular globes and orbital contents are unremarkable. No suspicious calvarial bone marrow signal.  MRA HEAD FINDINGS  Anterior circulation: Normal flow related and enhancement of the included cervical, petrous, cavernous and supra clinoid internal carotid arteries. Normal flow related enhancement of the left M1 segment and left middle cerebral artery bifurcation. Meniscus and occlusion of left superior M2 branch, axial 42/126. Normal flow related enhancement of the anterior cerebral arteries and right middle cerebral artery.  Posterior circulation: Left vertebral artery is dominant. Normal flow related enhancement of vertebral basilar system, patent main branch vessels. Small right posterior communicating artery present. Thready left P2 segment with absent left P3 flow related enhancement.  No aneurysm.  IMPRESSION: MRI head: Acute large left middle cerebral artery territory infarct.  Remote left posterior cerebral artery territory infarct.  MRA head: Occluded left M2 superior branch consistent with acute thromboembolic disease.  Occluded versus less likely slow flow left P3 segment, thready left P2 segment.   Electronically Signed   By: Elon Alas   On: 03/06/2014 01:00    Assessment/Plan: Diagnosis: Left MCA infarct with R HP and aphasia 1. Does the need for close, 24 hr/day medical supervision in concert with the patient's rehab needs make it unreasonable for this patient to be served in a less intensive setting? Yes 2. Co-Morbidities requiring supervision/potential complications: CKD 3, HTN, Hearing impainment 3. Due to bladder management, bowel management, safety, skin/wound care, disease management, medication  administration, pain management and patient education, does the patient require 24 hr/day rehab nursing? Yes 4. Does the patient require coordinated care of a physician, rehab nurse, PT (1-2 hrs/day, 5 days/week), OT (1-2 hrs/day, 5 days/week) and SLP (.5-1 hrs/day, 5 days/week) to address physical and functional deficits in the  context of the above medical diagnosis(es)? Yes Addressing deficits in the following areas: balance, endurance, locomotion, strength, transferring, bowel/bladder control, bathing, dressing, feeding, grooming, toileting, cognition, speech, language, swallowing and psychosocial support 5. Can the patient actively participate in an intensive therapy program of at least 3 hrs of therapy per day at least 5 days per week? Yes 6. The potential for patient to make measurable gains while on inpatient rehab is excellent 7. Anticipated functional outcomes upon discharge from inpatient rehab are modified independent  with PT, modified independent with OT, sup/minA with SLP. 8. Estimated rehab length of stay to reach the above functional goals is: 7-10days 9. Does the patient have adequate social supports to accommodate these discharge functional goals? Yes 10. Anticipated D/C setting: Home 11. Anticipated post D/C treatments: [place] therapy 12. Overall Rehab/Functional Prognosis: excellent  RECOMMENDATIONS: This patient's condition is appropriate for continued rehabilitative care in the following setting: CIR Patient has agreed to participate in recommended program. Yes Note that insurance prior authorization may be required for reimbursement for recommended care.  Comment: Per daughter pt has earing aide that she refuses to wear    03/07/2014

## 2014-03-07 NOTE — PMR Pre-admission (Signed)
PMR Admission Coordinator Pre-Admission Assessment  Patient: Traci Mclaughlin is an 63 y.o., female MRN: [id number] DOB: 05-20-1951 Height: 5\' 4"  (162.6 cm) Weight: 92.987 kg (205 lb)              Insurance Information HMO: yes    PPO:      PCP:      IPA:      80/20:      OTHER: medicare replacement policy PRIMARY: Bubba Hales      Policy#: [id number]      Subscriber: pt CM Name: Traci Mclaughlin      Phone#: [phone] ext [phone]     Fax#: [phone] to be followed by onsite reviewer Traci Mclaughlin [phone] Pre-Cert#: [id number]      Employer: retired Benefits:  Phone #: [phone]     Name: 8/13 Eff. 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 female with history of HTN, HOH, cervical cancer; who was admitted on 03/05/14 with right sided weakness, right facial droop and difficulty speaking X 1 day. Family was able to convince her to come to treat treatment and CT head revealed acute infarct superior left temporal lobe, portions of the posterior left frontal lobe, left extreme capsule and insular cortex with cytotoxic edema. 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 2D echo with EF 55% with basal inferior and inferior lateral hypokinesis and mid to apical  inferolateral akinesis. Patient with elevated cardiac enzymes and cardiology felt changes due to [facility] stroke. Carotid dopplers without significant ICA stenosis. 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 MD and rehab team recommending CIR for further therapies.  Patient to have loop recorder placed 03/12/14.  No further vascular surgery is planned at this time.  Total: 6=NIH  GCS=14  Past Medical History  Past Medical History  Diagnosis Date  . Cancer     cervical; was on chemo; has been in remission for 55yrs  . STEMI (ST elevation myocardial infarction) 06/2011  . Acute kidney injury 07/06/2011  . Hypertension   . HOH (hard of hearing)     Family History  family history includes Intracerebral hemorrhage in her daughter.  Prior Rehab/Hospitalizations: hysterectomy 8 years ago for cervical cancer; no prior rehab   Current Medications  Current facility-administered medications: stroke: mapping our early stages of recovery book, , Does not apply, Once, Wilber Oliphant, MD;  acetaminophen (TYLENOL) tablet 650 mg,  650 mg, Oral, Q4H PRN, Angelia Mould, MD;  amLODipine (NORVASC) tablet 5 mg, 5 mg, Oral, Daily, Blain Pais, MD, 5 mg at 03/12/14 1118;  aspirin chewable tablet 81 mg, 81 mg, Oral, Daily, Pixie Casino, MD, 81 mg at 03/12/14 1118 atorvastatin (LIPITOR) tablet 40 mg, 40 mg, Oral, q1800, Pixie Casino, MD, 40 mg at 03/11/14 1727;  clopidogrel (PLAVIX) tablet 75 mg, 75 mg, Oral, Daily, Pixie Casino, MD, 75 mg at 03/12/14 1118;  enoxaparin (LOVENOX) injection 40 mg, 40 mg, Subcutaneous, Q24H, Angelia Mould, MD, 40 mg at 03/12/14 9211;  hydrALAZINE (APRESOLINE) injection 10 mg, 10  mg, Intravenous, Q6H PRN, Angelia Mould, MD HYDROcodone-acetaminophen (NORCO/VICODIN) 5-325 MG per tablet 1 tablet, 1 tablet, Oral, Q6H PRN, Bethena Roys, MD, 1 tablet at 03/10/14 2226;  metoprolol tartrate (LOPRESSOR) tablet 12.5 mg, 12.5 mg, Oral, BID, Pixie Casino, MD, 12.5 mg at 03/12/14 1118;  ondansetron (ZOFRAN) injection 4 mg, 4 mg, Intravenous, Q6H PRN, Angelia Mould, MD polyethylene glycol (MIRALAX / GLYCOLAX) packet 17 g, 17 g, Oral, Daily, Blain Pais, MD, 17 g at 03/11/14 1515  Patients Current Diet: Dysphagia 2 with thin liquids  Precautions / Restrictions Precautions Precautions: Fall Precaution Comments: some impulsivity, likely trying to second guess what they therapist wants before being told Restrictions Weight Bearing Restrictions: No Other Position/Activity Restrictions: After consenting tx, nursing reported pt is getting a repeat doppler to LLE for edema and pain   Prior Activity Level Limited Community (1-2x/wk): Daughter Traci Mclaughlin states pt. is out of the house 2-3 times per week.  Uses [facility] to go to MD appointments and to visit her daughter.    Home Assistive Devices / Equipment Home Assistive Devices/Equipment: None  Prior Functional Level Prior Function Level of Independence: Independent Comments: Patient's daughter states patient was perfoming all tasks independently  Current Functional Level Cognition  Arousal/Alertness: Awake/alert Overall Cognitive Status: Impaired/Different from baseline Difficult to assess due to: Hard of hearing/deaf;Impaired communication Current Attention Level: Divided Orientation Level: Oriented to person;Oriented to place Following Commands: Follows one step commands with increased time Safety/Judgement: Decreased awareness of safety Attention: Sustained Sustained Attention: Appears intact Awareness:  (emergent awareness of linguistic errors appears intact) Behaviors: Poor frustration  tolerance    Extremity Assessment (includes Sensation/Coordination)  Upper Extremity Assessment: Generalized weakness  Lower Extremity Assessment: Generalized weakness  Cervical / Trunk Assessment: Kyphotic    ADLs  Overall ADL's : Needs assistance/impaired Eating/Feeding: Minimal assistance (requires min assist with bringing food>mouth) Eating/Feeding Details (indicate cue type and reason): tactile cueing Grooming: Min guard Grooming Details (indicate cue type and reason): Min guard for dynamic standing and hand over hand for actual grooming task due to poor proprioception Upper Body Bathing: Minimal assitance Lower Body Bathing: Minimal assistance Upper Body Dressing : Minimal assistance Lower Body Dressing: Moderate assistance Toilet Transfer: Minimal assistance Toileting- Clothing Manipulation and Hygiene: Minimal assistance Tub/ Shower Transfer: Minimal assistance Functional mobility during ADLs: Minimal assistance General ADL Comments: Patient functioning at an overall min assist for functional tasks. Patient requires multimodal cues to complete tasks. Due to aphasia, patient does require tactile and visual cueing. Patient with decreased dynamic standing balance/tolerance/endurance and tends to fatigue easily. Patient with noted decreased proprioception and sensation > RUE, unable to fully assess due to aphasia/cognition.     Mobility  Overal bed mobility: Needs Assistance Bed Mobility: Supine to Sit Rolling: Min guard Sidelying to sit: Min guard Supine to sit: Min assist Sit to supine:  Min assist General bed mobility comments: heavy use of the rail; R leg lagging and not as functional due to [place]  Overall transfer level: Needs assistance Equipment used: Rolling walker (2 wheeled) Transfers: Sit to/from [facility] Sit to Stand: Min assist Stand pivot transfers: Min assist General transfer comment: cues (v/t) for hand placement; stability assist     Ambulation / Gait / Stairs / Wheelchair Mobility  Ambulation/Gait Ambulation/Gait assistance: Museum/gallery curator (Feet): 130 Feet Assistive device: Rolling walker (2 wheeled) Gait Pattern/deviations: Step-through pattern Gait velocity: slower Gait velocity interpretation: Below normal speed for age/gender General Gait Details: mildly hemiparetic gait with need for minimal w/shift assist    Posture / Balance Overall balance assessment: Needs assistance  Sitting balance-Leahy Scale: Fair  Standing balance-Leahy Scale: Poor   Special needs/care consideration Skin no skin issues reported by RN or pt.                               Bowel mgmt:  Last BM08/17/15 Bladder mgmt:  Voiding with urgency up on bedside commode. Diabetic mgmt No    Previous Home Environment Living Arrangements: Children  Lives With: Daughter Available Help at Discharge: Family;Available 24 hours/day;Other (Comment) (daughter states pt. can live with her) Type of Home: House Home Layout: Two level;Able to live on main level with bedroom/bathroom Home Access: Stairs to enter Entrance Stairs-Rails: None Entrance Stairs-Number of Steps: 3-4 Bathroom Shower/Tub: Optometrist: Yes How Accessible: Accessible via walker Home Care Services: No Additional Comments: Family present and communicated with therapist. Patient unable fully understand questions or respond accurently  Discharge Living Setting Plans for Discharge Living Setting: Other (Comment) (home with daughter Traci Mclaughlin , location TBD) Type of Home at Discharge: Other (Comment) (TBD, either [place] or [place]) Does the patient have any problems obtaining your medications?: Yes (Describe) (if its not covered by medicare. )  Social/Family/Support Systems Patient Roles: Parent Contact Information: daughter Traci Mclaughlin [phone] Anticipated Caregiver: daughter Traci Mclaughlin, who is about  to reunite with her husband and move back to [place].  Traci Mclaughlin says she may move back to [place] once her mother has [place] from rehab.  Traci Mclaughlin has a home in [place] as well Anticipated Ambulance person Information: Traci Mclaughlin, daughter, [phone] Ability/Limitations of Caregiver: none, she does not work outside of the home, has 3 children Caregiver Availability: 24/7 Discharge Plan Discussed with Primary Caregiver: Yes Is Caregiver In Agreement with Plan?: Yes Does Caregiver/Family have Issues with Lodging/Transportation while Pt is in Rehab?: No  Goals/Additional Needs Patient/Family Goal for Rehab: mod I with PT/OT, supervision to min assist with SLP Expected length of stay: 7-10 days Cultural Considerations: no Dietary Needs: dysphagia 2 , thin liquids Equipment Needs: TBD Additional Information: Daughter Traci Mclaughlin states that she and her sister [name] do not speak.  She reports [name] is having some type of knee surgery today but knows no details.  Pt. has been living with [name] up until admission but has just received appproval for subsidized housing and was about to move into her own apartment; Pt/Family Agrees to Admission and willing to participate: Yes Program Orientation Provided & Reviewed with Pt/Caregiver Including Roles  & Responsibilities: Yes   Decrease burden of Care through IP rehab admission: no   Possible need for SNF placement upon discharge:  Not anticipated    Patient Condition: This patient's medical and functional status has changed since the  consult dated: 03/07/14 in which the Rehabilitation Physician determined and documented that the patient's condition is appropriate for intensive rehabilitative care in an inpatient rehabilitation facility. See "History of Present Illness" (above) for medical update. Functional changes are: Currently requiring min assist to ambulate 130 ft. RW. Patient's medical and functional status update has been discussed  with the Rehabilitation physician and patient remains appropriate for inpatient rehabilitation. Will admit to inpatient rehab today.  Preadmission Screen Completed By:  Jodell Cipro MPT , 03/12/2014 2:34 PM ______________________________________________________________________   Discussed status with Dr. Naaman Plummer on 03/12/14 at 1443 and received telephone approval for admission today.  Admission Coordinator:  Retta Diones,  PT time1443/Date08/17/15

## 2014-03-07 NOTE — Progress Notes (Signed)
Stroke Team Progress Note  HISTORY Traci Mclaughlin is a 63 y.o. female he was in her normal state of health up until yesterday when she began noticing right-sided weakness, slurred speech, difficulty speaking. Today, her daughter and asked her to come to the emergency room where was found she has a significant left MCA infarction.  LKW: 8/9  tpa given?: no, outside of window . She was admitted to the neuro floor bed for further evaluation and treatment.  SUBJECTIVE Her  Physical therapist and family is at the bedside.  Overall she feels her condition is gradually improving. She has been more alert and interactive this morning and mildly aphasic and has mild right-sided weakness.   OBJECTIVE Most recent Vital Signs: Filed Vitals:   03/07/14 0200 03/07/14 0500 03/07/14 0600 03/07/14 1011  BP: 142/81  159/89 138/75  Pulse: 81  90 84  Temp: 98 F (36.7 C)  98.3 F (36.8 C) 98.6 F (37 C)  TempSrc: Oral  Oral Oral  Resp: 20  18 20   Height:  5\' 3"  (1.6 m)    SpO2: 95%  96% 98%   CBG (last 3)   Recent Labs  03/05/14 1426  GLUCAP 101*    IV Fluid Intake:     MEDICATIONS  .  stroke: mapping our early stages of recovery book   Does not apply Once  . aspirin  81 mg Oral Daily  . atorvastatin  40 mg Oral q1800  . [START ON 03/08/2014] clopidogrel  75 mg Oral Daily  . metoprolol tartrate  12.5 mg Oral BID   PRN:    Diet:  Dysphagia   Activity:  Bedrest  DVT Prophylaxis:  SCDs  CLINICALLY SIGNIFICANT STUDIES Basic Metabolic Panel:   Recent Labs Lab 03/05/14 1508  NA 140  K 4.1  CL 102  CO2 22  GLUCOSE 93  BUN 14  CREATININE 1.10  CALCIUM 9.2   Liver Function Tests:   Recent Labs Lab 03/05/14 1508  AST 24  ALT 15  ALKPHOS 60  BILITOT 0.5  PROT 7.4  ALBUMIN 3.8   CBC:   Recent Labs Lab 03/05/14 1332  WBC 9.8  NEUTROABS 7.5  HGB 14.9  HCT 43.6  MCV 98.4  PLT PLATELETS APPEAR DECREASED   Coagulation:   Recent Labs Lab 03/05/14 1508  LABPROT  13.9  INR 1.07   Cardiac Enzymes:   Recent Labs Lab 03/05/14 1725 03/05/14 2245 03/06/14 0510  TROPONINI 0.37* 0.57* 0.43*   Urinalysis:   Recent Labs Lab 03/05/14 2237  COLORURINE YELLOW  LABSPEC 1.010  PHURINE 7.5  GLUCOSEU NEGATIVE  HGBUR NEGATIVE  BILIRUBINUR NEGATIVE  KETONESUR 15*  PROTEINUR NEGATIVE  UROBILINOGEN 1.0  NITRITE NEGATIVE  LEUKOCYTESUR NEGATIVE   Lipid Panel    Component Value Date/Time   CHOL 191 03/05/2014 2240   TRIG 106 03/05/2014 2240   HDL 48 03/05/2014 2240   CHOLHDL 4.0 03/05/2014 2240   VLDL 21 03/05/2014 2240   LDLCALC 122* 03/05/2014 2240   HgbA1C  Lab Results  Component Value Date   HGBA1C 5.8* 03/05/2014    Urine Drug Screen:     Component Value Date/Time   LABOPIA NONE DETECTED 03/05/2014 2237   LABOPIA NEGATIVE 07/05/2011 0936   COCAINSCRNUR NONE DETECTED 03/05/2014 2237   COCAINSCRNUR NEGATIVE 07/05/2011 0936   LABBENZ NONE DETECTED 03/05/2014 2237   LABBENZ NEGATIVE 07/05/2011 0936   AMPHETMU NONE DETECTED 03/05/2014 2237   AMPHETMU NEGATIVE 07/05/2011 0936   THCU POSITIVE* 03/05/2014 2237  LABBARB NONE DETECTED 03/05/2014 2237    Alcohol Level: No results found for this basename: ETH,  in the last 168 hours  Ct Head (brain) Wo Contrast  03/05/2014   CLINICAL DATA:  Facial droop and dysarthria ; right-sided weakness  EXAM: CT HEAD WITHOUT CONTRAST  TECHNIQUE: Contiguous axial images were obtained from the base of the skull through the vertex without intravenous contrast.  COMPARISON:  None.  FINDINGS: The ventricles are normal in size and configuration. There is no demonstrable mass, hemorrhage, extra-axial fluid collection, or midline shift. There is decreased attenuation in the superior left temporal lobe with involvement of the left extreme capsule and insular cortex as well consistent with an acute infarct with focal cytotoxic edema in this area. This recent appearing infarct extends more superiorly to involve a portion of the  posterior left frontal lobe.  Bony calvarium appears intact. The mastoid air cells are clear. There is nasal turbinate edema bilaterally. There is also decreased attenuation in a portion of the medial left occipital lobe which appears more chronic and may represent an older infarct. Elsewhere gray-white compartments appear unremarkable.  IMPRESSION: Acute infarct involving portions of the superior left temporal lobe as well as portions of the posterior left frontal lobe. There is also involvement of the left extreme capsule and insular cortex. There is cytotoxic edema in these areas.  There is decreased attenuation in the medial left occipital lobe with sparing of the post rib medial aspect of the left occipital lobe. Suspect older infarct in this area. There is no acute hemorrhage. There is no mass or midline shift. There is diffuse nasal turbinate edema bilaterally.   Electronically Signed   By: Lowella Grip M.D.   On: 03/05/2014 14:29   Mr Brain Wo Contrast  03/06/2014   CLINICAL DATA:  Right-sided weakness, speech difficulty. Acute left middle cerebral artery territory infarct.  EXAM: MRI HEAD WITHOUT CONTRAST  MRA HEAD WITHOUT CONTRAST  TECHNIQUE: Multiplanar, multiecho pulse sequences of the brain and surrounding structures were obtained without intravenous contrast. Angiographic images of the head were obtained using MRA technique without contrast.  COMPARISON:  CT of the head March 05, 2014 at 1408 hr  FINDINGS: MRI HEAD FINDINGS  Reduced diffusion within left frontal lobe with corresponding low ADC values. Mild expansile T2 hyperintense signal within the corresponding cortex. Local mass effect without midline shift. No susceptibility artifact to suggest hemorrhagic conversion.  Left mesial occipital lobe transcortical encephalomalacia with mild ex vacuo dilatation of the left occipital horn. The ventricles and sulci are otherwise normal for patient's age. A few scattered sub cm supratentorial white  matter T2 hyperintensities, exclusive of the aforementioned acute does infarct suggests chronic small vessel ischemic disease.  No abnormal extra-axial fluid collections. No abnormal sellar expansion. No cerebellar tonsillar ectopia. Trace paranasal sinus mucosal thickening with small right maxillary mucous retention cyst, the mastoid air cells appear well-aerated. Ocular globes and orbital contents are unremarkable. No suspicious calvarial bone marrow signal.  MRA HEAD FINDINGS  Anterior circulation: Normal flow related and enhancement of the included cervical, petrous, cavernous and supra clinoid internal carotid arteries. Normal flow related enhancement of the left M1 segment and left middle cerebral artery bifurcation. Meniscus and occlusion of left superior M2 branch, axial 42/126. Normal flow related enhancement of the anterior cerebral arteries and right middle cerebral artery.  Posterior circulation: Left vertebral artery is dominant. Normal flow related enhancement of vertebral basilar system, patent main branch vessels. Small right posterior communicating artery present. Thready  left P2 segment with absent left P3 flow related enhancement.  No aneurysm.  IMPRESSION: MRI head: Acute large left middle cerebral artery territory infarct.  Remote left posterior cerebral artery territory infarct.  MRA head: Occluded left M2 superior branch consistent with acute thromboembolic disease.  Occluded versus less likely slow flow left P3 segment, thready left P2 segment.   Electronically Signed   By: Elon Alas   On: 03/06/2014 01:00   Dg Chest Port 1 View  03/05/2014   CLINICAL DATA:  LEFT side stroke, weakness, slurred speech, past history MI, cervical cancer, hypertension  EXAM: PORTABLE CHEST - 1 VIEW  COMPARISON:  Portable exam 1455 hr compared to 07/03/2011  FINDINGS: Upper normal heart size.  Normal mediastinal contours and pulmonary vascularity.  Slight rotation to the LEFT.  Lungs grossly clear.  No  pleural effusion, pneumothorax or acute osseous findings.  IMPRESSION: No acute abnormalities.   Electronically Signed   By: Lavonia Dana M.D.   On: 03/05/2014 15:01   Mr Jodene Nam Head/brain Wo Cm  03/06/2014   CLINICAL DATA:  Right-sided weakness, speech difficulty. Acute left middle cerebral artery territory infarct.  EXAM: MRI HEAD WITHOUT CONTRAST  MRA HEAD WITHOUT CONTRAST  TECHNIQUE: Multiplanar, multiecho pulse sequences of the brain and surrounding structures were obtained without intravenous contrast. Angiographic images of the head were obtained using MRA technique without contrast.  COMPARISON:  CT of the head March 05, 2014 at 1408 hr  FINDINGS: MRI HEAD FINDINGS  Reduced diffusion within left frontal lobe with corresponding low ADC values. Mild expansile T2 hyperintense signal within the corresponding cortex. Local mass effect without midline shift. No susceptibility artifact to suggest hemorrhagic conversion.  Left mesial occipital lobe transcortical encephalomalacia with mild ex vacuo dilatation of the left occipital horn. The ventricles and sulci are otherwise normal for patient's age. A few scattered sub cm supratentorial white matter T2 hyperintensities, exclusive of the aforementioned acute does infarct suggests chronic small vessel ischemic disease.  No abnormal extra-axial fluid collections. No abnormal sellar expansion. No cerebellar tonsillar ectopia. Trace paranasal sinus mucosal thickening with small right maxillary mucous retention cyst, the mastoid air cells appear well-aerated. Ocular globes and orbital contents are unremarkable. No suspicious calvarial bone marrow signal.  MRA HEAD FINDINGS  Anterior circulation: Normal flow related and enhancement of the included cervical, petrous, cavernous and supra clinoid internal carotid arteries. Normal flow related enhancement of the left M1 segment and left middle cerebral artery bifurcation. Meniscus and occlusion of left superior M2 branch,  axial 42/126. Normal flow related enhancement of the anterior cerebral arteries and right middle cerebral artery.  Posterior circulation: Left vertebral artery is dominant. Normal flow related enhancement of vertebral basilar system, patent main branch vessels. Small right posterior communicating artery present. Thready left P2 segment with absent left P3 flow related enhancement.  No aneurysm.  IMPRESSION: MRI head: Acute large left middle cerebral artery territory infarct.  Remote left posterior cerebral artery territory infarct.  MRA head: Occluded left M2 superior branch consistent with acute thromboembolic disease.  Occluded versus less likely slow flow left P3 segment, thready left P2 segment.   Electronically Signed   By: Elon Alas   On: 03/06/2014 01:00      Carotid Doppler  1-39% bilateral ICA stenosis  2D Echocardiogram  Left ventricle: The cavity size was normal. Wall thickness was increased in a pattern of mild LVH. The estimated ejection fraction was 55%. Basal inferior and inferolateral hypokinesis. Mid to apical inferolateral akinesis.  CXR  No acute abnormalities.   EKG Normal sinus rhythm Left axis deviation Right bundle branch block Minimal voltage criteria for LVH, may be normal variant Inferior infarct , age undetermined Anterolateral infarct , age undetermined Therapy Recommendations pending Physical Exam   Frail elderly African American lady currently not in distress. . Afebrile. Head is nontraumatic. Neck is supple without bruit.   Cardiac exam no murmur or gallop. Lungs are clear to auscultation. Distal pulses are well felt. Neurological Exam : Awake and alert. 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 She can follow midline and few simple commands only. Slight left gaze preference but able to look to the right past midline. Pupils equal and  reactive. Fundi were not visualized. Vision acuity cannot be tested. Blinks to threat on the left but not on the right. Mild right lower facial weakness. Tongue is midline. Motor system exam will mild right lower extremity drift. Mildweakness of right grip and intrinsic hand muscles. Sensation diminished on the right side. Deep tendon reflexes are symmetric.    Gait was deferred. ASSESSMENT Ms. Traci Mclaughlin is a 63 y.o. 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 Now on aspirin 300 mg rectally every day for secondary stroke prevention. Patient with resultant aphasia and mild right hemiparesis.. Stroke work up underway.   HT-on amlodipine PTA  LDL 122-not on statins PTA  Cannabis abuse  Intracranial athersoclerosis   Hospital day # 2  TREATMENT/PLAN  Continue aspirin for secondary stroke prevention.  Continue ongoing Speech therapy consult for swallowing. Physical and occupational therapy consults.  Mobilize out of bed.  She is likely need to have inpatient rehabilitation needs  Add statin  For elevated LDL I Check TEE and loop recorder for cardiac source of embolism. I spoke to Dr. Debara Pickett cardiologist to set this up  Antony Contras, MD Medical Director [place] Pager: [phone] 03/07/2014 1:16 PM      SIGNED    To contact Stroke Continuity provider, please refer to [email or web address] After hours, contact General Neurology

## 2014-03-07 NOTE — Progress Notes (Signed)
Subjective:  Patient is more calm today. Is improving in terms of understanding speech. Still remains aphasic. Denies any other complaints.    Objective: Vital signs in last 24 hours: Filed Vitals:   03/07/14 0500 03/07/14 0600 03/07/14 1011 03/07/14 1521  BP:  159/89 138/75 150/91  Pulse:  90 84 78  Temp:  98.3 F (36.8 C) 98.6 F (37 C) 98.1 F (36.7 C)  TempSrc:  Oral Oral Oral  Resp:  18 20 20   Height: 5\' 3"  (1.6 m)     SpO2:  96% 98% 97%   Weight change:  No intake or output data in the 24 hours ending 03/07/14 1524  Vitals reviewed. General:lying in bed, calm. Is able to understand our speech better today. HEENT: PERRL, EOMI, no scleral icterus. Is not able to puff cheek. Has some right facial droop present. Cannot turn head to the right.  Cardiac: RRR, no rubs, murmurs or gallops Pulm: clear to auscultation bilaterally, no wheezes, rales, or rhonchi Abd: soft, nontender, nondistended, BS present Ext: warm and well perfused, no pedal edema Neuro: alert to self, couldn't assess full orientation. Has right facial droop.Slurred speech. Patient is aphasic mostly except for "NO" and "OK". No pronator drift. 4/5 strength right upper and lower ext. 5/5 on left extremities. Sensation intact.right sided XII nerve deficit. Facial droop on right side.   Lab Results: Basic Metabolic Panel:  Recent Labs Lab 03/05/14 1508  NA 140  K 4.1  CL 102  CO2 22  GLUCOSE 93  BUN 14  CREATININE 1.10  CALCIUM 9.2   Liver Function Tests:  Recent Labs Lab 03/05/14 1508  AST 24  ALT 15  ALKPHOS 60  BILITOT 0.5  PROT 7.4  ALBUMIN 3.8   CBC:  Recent Labs Lab 03/05/14 1332  WBC 9.8  NEUTROABS 7.5  HGB 14.9  HCT 43.6  MCV 98.4  PLT PLATELETS APPEAR DECREASED   Cardiac Enzymes:  Recent Labs Lab 03/05/14 1725 03/05/14 2245 03/06/14 0510  TROPONINI 0.37* 0.57* 0.43*   BNP: No results found for this basename: PROBNP,  in the last 168 hours D-Dimer: No results  found for this basename: DDIMER,  in the last 168 hours CBG:  Recent Labs Lab 03/05/14 1426  GLUCAP 101*   Hemoglobin A1C:  Recent Labs Lab 03/05/14 2245  HGBA1C 5.8*   Fasting Lipid Panel:  Recent Labs Lab 03/05/14 2240  CHOL 191  HDL 48  LDLCALC 122*  TRIG 106  CHOLHDL 4.0   Urine Drug Screen: Drugs of Abuse     Component Value Date/Time   LABOPIA NONE DETECTED 03/05/2014 2237   LABOPIA NEGATIVE 07/05/2011 0936   COCAINSCRNUR NONE DETECTED 03/05/2014 2237   COCAINSCRNUR NEGATIVE 07/05/2011 0936   LABBENZ NONE DETECTED 03/05/2014 2237   LABBENZ NEGATIVE 07/05/2011 0936   AMPHETMU NONE DETECTED 03/05/2014 2237   AMPHETMU NEGATIVE 07/05/2011 0936   THCU POSITIVE* 03/05/2014 2237   LABBARB NONE DETECTED 03/05/2014 2237    Alcohol Level: No results found for this basename: ETH,  in the last 168 hours Urinalysis:  Recent Labs Lab 03/05/14 2237  COLORURINE YELLOW  LABSPEC 1.010  PHURINE 7.5  GLUCOSEU NEGATIVE  HGBUR NEGATIVE  BILIRUBINUR NEGATIVE  KETONESUR 15*  PROTEINUR NEGATIVE  UROBILINOGEN 1.0  NITRITE NEGATIVE  LEUKOCYTESUR NEGATIVE  Studies/Results: Mr Brain Wo Contrast  03/06/2014   CLINICAL DATA:  Right-sided weakness, speech difficulty. Acute left middle cerebral artery territory infarct.  EXAM: MRI HEAD WITHOUT CONTRAST  MRA HEAD WITHOUT CONTRAST  TECHNIQUE: Multiplanar, multiecho pulse sequences of the brain and surrounding structures were obtained without intravenous contrast. Angiographic images of the head were obtained using MRA technique without contrast.  COMPARISON:  CT of the head March 05, 2014 at 1408 hr  FINDINGS: MRI HEAD FINDINGS  Reduced diffusion within left frontal lobe with corresponding low ADC values. Mild expansile T2 hyperintense signal within the corresponding cortex. Local mass effect without midline shift. No susceptibility artifact to suggest hemorrhagic conversion.  Left mesial occipital lobe transcortical encephalomalacia with mild  ex vacuo dilatation of the left occipital horn. The ventricles and sulci are otherwise normal for patient's age. A few scattered sub cm supratentorial white matter T2 hyperintensities, exclusive of the aforementioned acute does infarct suggests chronic small vessel ischemic disease.  No abnormal extra-axial fluid collections. No abnormal sellar expansion. No cerebellar tonsillar ectopia. Trace paranasal sinus mucosal thickening with small right maxillary mucous retention cyst, the mastoid air cells appear well-aerated. Ocular globes and orbital contents are unremarkable. No suspicious calvarial bone marrow signal.  MRA HEAD FINDINGS  Anterior circulation: Normal flow related and enhancement of the included cervical, petrous, cavernous and supra clinoid internal carotid arteries. Normal flow related enhancement of the left M1 segment and left middle cerebral artery bifurcation. Meniscus and occlusion of left superior M2 branch, axial 42/126. Normal flow related enhancement of the anterior cerebral arteries and right middle cerebral artery.  Posterior circulation: Left vertebral artery is dominant. Normal flow related enhancement of vertebral basilar system, patent main branch vessels. Small right posterior communicating artery present. Thready left P2 segment with absent left P3 flow related enhancement.  No aneurysm.  IMPRESSION: MRI head: Acute large left middle cerebral artery territory infarct.  Remote left posterior cerebral artery territory infarct.  MRA head: Occluded left M2 superior branch consistent with acute thromboembolic disease.  Occluded versus less likely slow flow left P3 segment, thready left P2 segment.   Electronically Signed   By: Elon Alas   On: 03/06/2014 01:00   Mr Jodene Nam Head/brain Wo Cm  03/06/2014   CLINICAL DATA:  Right-sided weakness, speech difficulty. Acute left middle cerebral artery territory infarct.  EXAM: MRI HEAD WITHOUT CONTRAST  MRA HEAD WITHOUT CONTRAST  TECHNIQUE:  Multiplanar, multiecho pulse sequences of the brain and surrounding structures were obtained without intravenous contrast. Angiographic images of the head were obtained using MRA technique without contrast.  COMPARISON:  CT of the head March 05, 2014 at 1408 hr  FINDINGS: MRI HEAD FINDINGS  Reduced diffusion within left frontal lobe with corresponding low ADC values. Mild expansile T2 hyperintense signal within the corresponding cortex. Local mass effect without midline shift. No susceptibility artifact to suggest hemorrhagic conversion.  Left mesial occipital lobe transcortical encephalomalacia with mild ex vacuo dilatation of the left occipital horn. The ventricles and sulci are otherwise normal for patient's age. A few scattered sub cm supratentorial white matter T2 hyperintensities, exclusive of the aforementioned acute does infarct suggests chronic small vessel ischemic disease.  No abnormal extra-axial fluid collections. No abnormal sellar expansion. No cerebellar tonsillar ectopia. Trace paranasal sinus mucosal thickening with small right maxillary mucous retention cyst, the mastoid air cells appear well-aerated. Ocular globes and orbital contents are unremarkable. No suspicious calvarial bone marrow signal.  MRA HEAD FINDINGS  Anterior circulation: Normal flow related and enhancement of the included cervical, petrous, cavernous and supra clinoid internal carotid arteries. Normal flow related enhancement of the left M1 segment and left middle cerebral artery bifurcation. Meniscus and occlusion of left superior M2  branch, axial 42/126. Normal flow related enhancement of the anterior cerebral arteries and right middle cerebral artery.  Posterior circulation: Left vertebral artery is dominant. Normal flow related enhancement of vertebral basilar system, patent main branch vessels. Small right posterior communicating artery present. Thready left P2 segment with absent left P3 flow related enhancement.  No  aneurysm.  IMPRESSION: MRI head: Acute large left middle cerebral artery territory infarct.  Remote left posterior cerebral artery territory infarct.  MRA head: Occluded left M2 superior branch consistent with acute thromboembolic disease.  Occluded versus less likely slow flow left P3 segment, thready left P2 segment.   Electronically Signed   By: Elon Alas   On: 03/06/2014 01:00   Medications: I have reviewed the patient's current medications. Scheduled Meds: .  stroke: mapping our early stages of recovery book   Does not apply Once  . aspirin  81 mg Oral Daily  . atorvastatin  40 mg Oral q1800  . [START ON 03/08/2014] clopidogrel  75 mg Oral Daily  . metoprolol tartrate  12.5 mg Oral BID   Continuous Infusions:  PRN Meds:. Assessment/Plan: Principal Problem:   Stroke Active Problems:   Elevation of cardiac enzymes   CKD (chronic kidney disease) stage 3, GFR 30-59 ml/min   Hypertension   NSTEMI (non-ST elevated myocardial infarction)   CVA (cerebral infarction)   Right hemiplegia   Dyslipidemia   Ischemic Stroke  -acute infarct of superior left temporal lobe and portions of posterior left frontal lobe + left extreme capsule and insular cortex. There is no midline shift. There is cytotoxic edema. No acute hemorrhage.  - ASA 300 PO. F/up neuro recs. - MRA and MRI brain showed Acute large left middle cerebral artery territory infarct. Remote left posterior cerebral artery territory infarct. - doppler carotids showed no severe stenosis. Bilateral 1-39% ICA stenosis. - permissive HTN upto SBP 220/120. if higher then can treat with levatalol.  - neuro checks - she has high risk of recurrent strokes. - PT and OT: recommended SNF/inpatient rehab. Speech recommended dysphagia 2 diet.  - Cont to work with PT and speech therapy.  - ECHO shows EF 55% with basal inferior and inferolateral hypokinesis. Cards wouldn't intervene for NSTEMI since she is in acute stroke. Will get outpatient  stress test in 1 month per cardiology. - TEE tomorrow AM for cardiac source of embolism. Loop recorder. - hgbA1c 5.8. Lipid panel showed LDL 122.started lipitor 40mg  daily (LDL goal <70).  Elevated cardiac enzymes -0.57>0.43.  - cycled trops. Elevated by trending down.  Eevation could be 2/2 to demand ischemia in the setting of stroke and HTN or NSTEMI. - ECHO shows EF 55% with basal inferior and inferolateral hypokinesis. 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 deneis chest pain. There are no signs of ongoing ischemia.  Cards wouldn't intervene for NSTEMI such as CATH since she is in acute stroke and that would be high risk. Will get outpatient stress test in 1 month per cardiology. - unable to do anything for NSTEMI in the setting of stroke.  - Will get outpatient stress test in 1 month per cardiology. HTN  - added metoprolol 12.5mg  BID- doesn't need permissive hypertension anymore per neuro.  CKD - stable now  - not sure about baseline Crt, seems to be around 1.6. Currently 1.10 better than baseline  - repeat BMP tomorrow.  Diet: dysphagia 2 Code: FULL   Dispo: Disposition is deferred at this time, awaiting improvement of current medical problems.  Anticipated discharge in approximately 2-3 day(s).   The patient does have a current PCP (No primary provider on file.) and does need an [facility] hospital follow-up appointment after discharge.  The patient does not know have transportation limitations that hinder transportation to clinic appointments.  .Services Needed at time of discharge: Y = Yes, Blank = No PT:   OT:   RN:   Equipment:   Other:     LOS: 2 days   Dellia Nims, MD 03/07/2014, 3:24 PM

## 2014-03-07 NOTE — Progress Notes (Signed)
  I have seen and examined the patient, and reviewed the daily progress note by Claris Pong, MS III and discussed the care of the patient with them. Please see my progress note from 03/07/2014 for further details regarding assessment and plan.    Signed:  Dellia Nims, MD 03/07/2014, 3:52 PM

## 2014-03-07 NOTE — Progress Notes (Signed)
Subjective: Patient had improved mood this morning. She is able to comprehend and respond to statements. She still has some difficulty following commands.  Objective: Vital signs in last 24 hours: Filed Vitals:   03/06/14 2149 03/07/14 0200 03/07/14 0500 03/07/14 0600  BP: 127/85 142/81  159/89  Pulse: 92 81  90  Temp: 99.8 F (37.7 C) 98 F (36.7 C)  98.3 F (36.8 C)  TempSrc: Oral Oral  Oral  Resp: 20 20  18   Height:   5\' 3"  (1.6 m)   SpO2: 99% 95%  96%   Physical exam: General: Awake, lying in bed, in NAD  HEENT: Normocephalic, atraumatic, anicteric sclera, no conjunctivitis  CV: RRR, nl S1/S2, no murmurs, rubs, or gallops  Pulm: Normal work of breathing, no wheezes or crackles appreciated  GI: Soft, non tender, non distended, normoactive bowel sounds   Neuro: Alert and oriented x3, right side facial droop, EOMI  Lab Results: Basic Metabolic Panel:  Recent Labs  03/05/14 1508  NA 140  K 4.1  CL 102  CO2 22  GLUCOSE 93  BUN 14  CREATININE 1.10  CALCIUM 9.2   Liver Function Tests:  Recent Labs  03/05/14 1508  AST 24  ALT 15  ALKPHOS 60  BILITOT 0.5  PROT 7.4  ALBUMIN 3.8   CBC:  Recent Labs  03/05/14 1332  WBC 9.8  NEUTROABS 7.5  HGB 14.9  HCT 43.6  MCV 98.4  PLT PLATELETS APPEAR DECREASED   Cardiac Enzymes:  Recent Labs  03/05/14 1725 03/05/14 2245 03/06/14 0510  TROPONINI 0.37* 0.57* 0.43*   CBG:  Recent Labs  03/05/14 1426  GLUCAP 101*   Fasting Lipid Panel:  Recent Labs  03/05/14 2240  CHOL 191  HDL 48  LDLCALC 122*  TRIG 106  CHOLHDL 4.0   Coagulation:  Recent Labs  03/05/14 1508  LABPROT 13.9  INR 1.07   Urine Drug Screen: Drugs of Abuse     Component Value Date/Time   LABOPIA NONE DETECTED 03/05/2014 2237   COCAINSCRNUR NONE DETECTED 03/05/2014 2237   LABBENZ NONE DETECTED 03/05/2014 2237   AMPHETMU NONE DETECTED 03/05/2014 2237   THCU POSITIVE* 03/05/2014 2237   LABBARB NONE DETECTED 03/05/2014 2237     Urinalysis:  Recent Labs  03/05/14 2237  COLORURINE YELLOW  LABSPEC 1.010  PHURINE 7.5  GLUCOSEU NEGATIVE  HGBUR NEGATIVE  BILIRUBINUR NEGATIVE  KETONESUR 15*  PROTEINUR NEGATIVE  UROBILINOGEN 1.0  NITRITE NEGATIVE  LEUKOCYTESUR NEGATIVE   Imaging results:  Mr Jodene Nam Head/brain Wo Cm 03/06/2014  FINDINGS:   MRI HEAD FINDINGS  Reduced diffusion within left frontal lobe with corresponding low ADC values. Mild expansile T2 hyperintense signal within the corresponding cortex. Local mass effect without midline shift. No susceptibility artifact to suggest hemorrhagic conversion.  Left mesial occipital lobe transcortical encephalomalacia with mild ex vacuo dilatation of the left occipital horn. The ventricles and sulci are otherwise normal for patient's age. A few scattered sub cm supratentorial white matter T2 hyperintensities, exclusive of the aforementioned acute does infarct suggests chronic small vessel ischemic disease.  No abnormal extra-axial fluid collections. No abnormal sellar expansion. No cerebellar tonsillar ectopia. Trace paranasal sinus mucosal thickening with small right maxillary mucous retention cyst, the mastoid air cells appear well-aerated. Ocular globes and orbital contents are unremarkable. No suspicious calvarial bone marrow signal.    MRA HEAD FINDINGS  Anterior circulation: Normal flow related and enhancement of the included cervical, petrous, cavernous and supra clinoid internal carotid arteries. Normal flow  related enhancement of the left M1 segment and left middle cerebral artery bifurcation. Meniscus and occlusion of left superior M2 branch, axial 42/126. Normal flow related enhancement of the anterior cerebral arteries and right middle cerebral artery.  Posterior circulation: Left vertebral artery is dominant. Normal flow related enhancement of vertebral basilar system, patent main branch vessels. Small right posterior communicating artery present. Thready left P2  segment with absent left P3 flow related enhancement.  No aneurysm.    IMPRESSION:   MRI head: Acute large left middle cerebral artery territory infarct.  Remote left posterior cerebral artery territory infarct.   MRA head: Occluded left M2 superior branch consistent with acute thromboembolic disease.  Occluded versus less likely slow flow left P3 segment, thready left P2 segment.  CT Head (brain) Wo Contrast: 03/05/2014  Acute infarct involving portions of the superior left temporal lobe as well as portions of the posterior left frontal lobe. There is also involvement of the left extreme capsule and insular cortex. There is cytotoxic edema in these areas. There is decreased attenuation in the medial left occipital lobe with sparing of the post rib medial aspect of the left occipital lobe. Suspect older infarct in this area. There is no acute hemorrhage. There is no mass or midline shift. There is diffuse nasal turbinate edema bilaterally.   Dg Chest Port 1 View 03/05/2014  Upper normal heart size. Normal mediastinal contours and pulmonary vascularity. Slight rotation to the LEFT. Lungs grossly clear. No pleural effusion, pneumothorax or acute osseous findings.  No acute abnormalities.  Other results:  SLP: Dysphagia Diet 2 (liquids) and Oral care BID, and continued SLP services PT: Recommends SNF, CIR, or 24hr Supervision/Assistance, and Rehab/OT consult EKG: NSR, Old: LAD, RBBB, Q wave II,III,aVF, New: Q wave V3-V6 Carotid duplex: Bilateral 1-39% ICA stenosis. Vertebral artery flow is antegrade.  Medications: I have reviewed the patient's current medications. Scheduled Meds: .  stroke: mapping our early stages of recovery book   Does not apply Once  . aspirin  300 mg Rectal Daily   Or  . aspirin  325 mg Oral Daily  . atorvastatin  40 mg Oral q1800   Continuous Infusions:  PRN Meds:.  Assessment/Plan: Principal problem:  Left MCA Infarction  Active problems:  Elevation of cardiac  enzymes Hypertension   Traci Mclaughlin is a 63 yo female with a history of hypertension, STEMI (2012), and impaired hearing, who presents with left MCA infarction.  Left MCA infarction Patient had a large left thromboembolic MCA stroke that affected both the left temporal lobe and the posterior left frontal lobe. There was no acute hemorrhage. Risk stratification showed high lipids at 122, normal A1c 5.8, normal carotid dopplers. Echo showed mild LVH with some inf/inferolateral wall motion abnormalities and EF 55%. Evaluations from PT/OT/SLP showed problems with both receptive and expressive communication compounded by her hearing impairment, some swallowing abnormalities, and weakness and incoordination of her right upper extremity. Neurology recommends TEE and subsequent loop recorder to identify cause of embolic stroke. We are arranging for CIR placement afterwards. Plan to have an assessment for hearing aids at the CIR  Elevation of cardiac enzymes  Troponin of 0.23 --> 0.37 --> 0.57 --> 0.43. Denied chest pain. EKG shows old RBBB, Q waves in II,III,aVF, and new Q waves in V3-V6. -- Echo normal -- Continue aspirin 325mg  and atorvastatin 40 mg  Hypertension  In the setting of stroke, allow her to be hypertensive and focus on treating the underlying pathology before instituting treatment.  DVT Prophylaxis -- Sequential Compression Devices  This is a Careers information officer Note.  The care of the patient was discussed with Dr. Dellia Nims and the assessment and plan formulated with their assistance.  Please see their attached note for official documentation of the daily encounter.   LOS: 2 days   Tommie Sams, Med Student 03/07/2014, 8:21 AM

## 2014-03-08 ENCOUNTER — Encounter (HOSPITAL_COMMUNITY): Payer: Self-pay | Admitting: *Deleted

## 2014-03-08 ENCOUNTER — Encounter (HOSPITAL_COMMUNITY)
Admission: EM | Disposition: A | Discharge status: Rehabilitation Facility | Payer: Self-pay | Source: Home / Self Care | Attending: Internal Medicine

## 2014-03-08 DIAGNOSIS — Z9289 Personal history of other medical treatment: Secondary | ICD-10-CM

## 2014-03-08 DIAGNOSIS — I998 Other disorder of circulatory system: Secondary | ICD-10-CM

## 2014-03-08 DIAGNOSIS — I6789 Other cerebrovascular disease: Secondary | ICD-10-CM

## 2014-03-08 DIAGNOSIS — E785 Hyperlipidemia, unspecified: Secondary | ICD-10-CM

## 2014-03-08 DIAGNOSIS — G8190 Hemiplegia, unspecified affecting unspecified side: Secondary | ICD-10-CM

## 2014-03-08 DIAGNOSIS — N179 Acute kidney failure, unspecified: Secondary | ICD-10-CM

## 2014-03-08 HISTORY — DX: Personal history of other medical treatment: Z92.89

## 2014-03-08 HISTORY — PX: TEE WITHOUT CARDIOVERSION: SHX5443

## 2014-03-08 HISTORY — PX: LOOP RECORDER IMPLANT: SHX5477

## 2014-03-08 SURGERY — ECHOCARDIOGRAM, TRANSESOPHAGEAL
Anesthesia: Moderate Sedation

## 2014-03-08 SURGERY — LOOP RECORDER IMPLANT
Anesthesia: LOCAL

## 2014-03-08 MED ORDER — HYDROCODONE-ACETAMINOPHEN 5-325 MG PO TABS
1.0000 | ORAL_TABLET | Freq: Once | ORAL | Status: AC
  Administered 2014-03-09: 1 via ORAL
  Filled 2014-03-08: qty 1

## 2014-03-08 MED ORDER — SODIUM CHLORIDE 0.9 % IV SOLN
INTRAVENOUS | Status: DC
  Administered 2014-03-08: 500 mL via INTRAVENOUS

## 2014-03-08 MED ORDER — LIDOCAINE VISCOUS 2 % MT SOLN
OROMUCOSAL | Status: AC
  Filled 2014-03-08: qty 15

## 2014-03-08 MED ORDER — FENTANYL CITRATE 0.05 MG/ML IJ SOLN
INTRAMUSCULAR | Status: AC
  Filled 2014-03-08: qty 2

## 2014-03-08 MED ORDER — SODIUM CHLORIDE 0.9 % IV SOLN: Freq: Once | INTRAVENOUS | Status: DC

## 2014-03-08 MED ORDER — MIDAZOLAM HCL 10 MG/2ML IJ SOLN
INTRAMUSCULAR | Status: DC | PRN
  Administered 2014-03-08: 1 mg via INTRAVENOUS
  Administered 2014-03-08: 2 mg via INTRAVENOUS
  Administered 2014-03-08: 1 mg via INTRAVENOUS

## 2014-03-08 MED ORDER — FENTANYL CITRATE 0.05 MG/ML IJ SOLN
INTRAMUSCULAR | Status: DC | PRN
  Administered 2014-03-08: 25 ug via INTRAVENOUS
  Administered 2014-03-08 (×2): 12.5 ug via INTRAVENOUS

## 2014-03-08 MED ORDER — LIDOCAINE VISCOUS 2 % MT SOLN
OROMUCOSAL | Status: DC | PRN
  Administered 2014-03-08: 12 mL via OROMUCOSAL

## 2014-03-08 MED ORDER — MIDAZOLAM HCL 5 MG/ML IJ SOLN
INTRAMUSCULAR | Status: AC
  Filled 2014-03-08: qty 2

## 2014-03-08 NOTE — Discharge Instructions (Signed)
It was a pleasure taking care of you. You were admitted here with acute ischemic stroke.  You will need to be inpatient rehab to regain your strength and try to get maximize the return to your baseline functioning.  Please follow up at the [facility] and with the cardiology clinic as instructed.    Ischemic Stroke A stroke (cerebrovascular accident) is the sudden death of brain tissue. It is a medical emergency. A stroke can cause permanent loss of brain function. This can cause problems with different parts of your body. A transient ischemic attack (TIA) is different because it does not cause permanent damage. A TIA is a short-lived problem of poor blood flow affecting a part of the brain. A TIA is also a serious problem because having a TIA greatly increases the chances of having a stroke. When symptoms first develop, you cannot know if the problem might be a stroke or a TIA. CAUSES  A stroke is caused by a decrease of oxygen supply to an area of your brain. It is usually the result of a small blood clot or collection of cholesterol or fat (plaque) that blocks blood flow in the brain. A stroke can also be caused by blocked or damaged carotid arteries.  RISK FACTORS  High blood pressure (hypertension).  High cholesterol.  Diabetes mellitus.  Heart disease.  The buildup of plaque in the blood vessels (peripheral artery disease or atherosclerosis).  The buildup of plaque in the blood vessels providing blood and oxygen to the brain (carotid artery stenosis).  An abnormal heart rhythm (atrial fibrillation).  Obesity.  Smoking.  Taking oral contraceptives (especially in combination with smoking).  Physical inactivity.  A diet high in fats, salt (sodium), and calories.  Alcohol use.  Use of illegal drugs (especially cocaine and methamphetamine).  Being African American.  Being over the age of 36.  Family history of stroke.  Previous history of blood clots, stroke,  TIA, or heart attack.  Sickle cell disease. SYMPTOMS  These symptoms usually develop suddenly, or may be newly present upon awakening from sleep:  Sudden weakness or numbness of the face, arm, or leg, especially on one side of the body.  Sudden trouble walking or difficulty moving arms or legs.  Sudden confusion.  Sudden personality changes.  Trouble speaking (aphasia) or understanding.  Difficulty swallowing.  Sudden trouble seeing in one or both eyes.  Double vision.  Dizziness.  Loss of balance or coordination.  Sudden severe headache with no known cause.  Trouble reading or writing. DIAGNOSIS  Your health care provider can often determine the presence or absence of a stroke based on your symptoms, history, and physical exam. Computed tomography (CT) of the brain is usually performed to confirm the stroke, determine causes, and determine stroke severity. Other tests may be done to find the cause of the stroke. These tests may include:  Electrocardiography.  Continuous heart monitoring.  Echocardiography.  Carotid ultrasonography.  Magnetic resonance imaging (MRI).  A scan of the brain circulation.  Blood tests. PREVENTION  The risk of a stroke can be decreased by appropriately treating high blood pressure, high cholesterol, diabetes, heart disease, and obesity and by quitting smoking, limiting alcohol, and staying physically active. TREATMENT  Time is of the essence. It is important to seek treatment at the first sign of these symptoms because you may receive a medicine to dissolve the clot (thrombolytic) that cannot be given if too much time has passed since your symptoms began. Even if  you do not know when your symptoms began, get treatment as soon as possible as there are other treatment options available including oxygen, intravenous (IV) fluids, and medicines to thin the blood (anticoagulants). Treatment of stroke depends on the duration, severity, and cause of  your symptoms. Medicines and dietary changes may be used to address diabetes, high blood pressure, and other risk factors. Physical, speech, and occupational therapists will assess you and work with you to improve any functions impaired by the stroke. Measures will be taken to prevent short-term and long-term complications, including infection from breathing foreign material into the lungs (aspiration pneumonia), blood clots in the legs, bedsores, and falls. Rarely, surgery may be needed to remove large blood clots or to open up blocked arteries. HOME CARE INSTRUCTIONS   Take medicines only as directed by your health care provider. Follow the directions carefully. Medicines may be used to control risk factors for a stroke. Be sure you understand all your medicine instructions.  You may be told to take a medicine to thin the blood, such as aspirin or the anticoagulant warfarin. Warfarin needs to be taken exactly as instructed.  Too much and too little warfarin are both dangerous. Too much warfarin increases the risk of bleeding. Too little warfarin continues to allow the risk for blood clots. While taking warfarin, you will need to have regular blood tests to measure your blood clotting time. These blood tests usually include both the PT and INR tests. The PT and INR results allow your health care provider to adjust your dose of warfarin. The dose can change for many reasons. It is critically important that you take warfarin exactly as prescribed, and that you have your PT and INR levels drawn exactly as directed.  Many foods, especially foods high in vitamin K, can interfere with warfarin and affect the PT and INR results. Foods high in vitamin K include spinach, kale, broccoli, cabbage, collard and turnip greens, brussels sprouts, peas, cauliflower, seaweed, and parsley, as well as beef and pork liver, green tea, and soybean oil. You should eat a consistent amount of foods high in vitamin K. Avoid major  changes in your diet, or notify your health care provider before changing your diet. Arrange a visit with a dietitian to answer your questions.  Many medicines can interfere with warfarin and affect the PT and INR results. You must tell your health care provider about any and all medicines you take. This includes all vitamins and supplements. Be especially cautious with aspirin and anti-inflammatory medicines. Do not take or discontinue any prescribed or over-the-counter medicine except on the advice of your health care provider or pharmacist.  Warfarin can have side effects, such as excessive bruising or bleeding. You will need to hold pressure over cuts for longer than usual. Your health care provider or pharmacist will discuss other potential side effects.  Avoid sports or activities that may cause injury or bleeding.  Be mindful when shaving, flossing your teeth, or handling sharp objects.  Alcohol can change the body's ability to handle warfarin. It is best to avoid alcoholic drinks or consume only very small amounts while taking warfarin. Notify your health care provider if you change your alcohol intake.  Notify your dentist or other health care providers before procedures.  If swallow studies have determined that your swallowing reflex is present, you should eat healthy foods. Including 5 or more servings of fruits and vegetables a day may reduce the risk of stroke. Foods may need to be  a certain consistency (soft or pureed), or small bites may need to be taken in order to avoid aspirating or choking. Certain dietary changes may be advised to address high blood pressure, high cholesterol, diabetes, or obesity.  Food choices that are low in sodium, saturated fat, trans fat, and cholesterol are recommended to manage high blood pressure.  Food choies that are high in fiber, and low in saturated fat, trans fat, and cholesterol may control cholesterol levels.  Controlling carbohydrates and  sugar intake is recommended to manage diabetes.  Reducing calorie intake and making food choices that are low in sodium, saturated fat, trans fat, and cholesterol are recommended to manage obesity.  Maintain a healthy weight.  Stay physically active. It is recommended that you get at least 30 minutes of activity on all or most days.  Do not use any tobacco products including cigarettes, chewing tobacco, or electronic cigarettes.  Limit alcohol use even if you are not taking warfarin. Moderate alcohol use is considered to be:  No more than 2 drinks each day for men.  No more than 1 drink each day for nonpregnant women.  Home safety. A safe home environment is important to reduce the risk of falls. Your health care provider may arrange for specialists to evaluate your home. Having grab bars in the bedroom and bathroom is often important. Your health care provider may arrange for equipment to be used at home, such as raised toilets and a seat for the shower.  Physical, occupational, and speech therapy. Ongoing therapy may be needed to maximize your recovery after a stroke. If you have been advised to use a walker or a cane, use it at all times. Be sure to keep your therapy appointments.  Follow all instructions for follow-up with your health care provider. This is very important. This includes any referrals, physical therapy, rehabilitation, and lab tests. Proper follow-up can prevent another stroke from occurring. SEEK MEDICAL CARE IF:  You have personality changes.  You have difficulty swallowing.  You are seeing double.  You have dizziness.  You have a fever.  You have skin breakdown. SEEK IMMEDIATE MEDICAL CARE IF:  Any of these symptoms may represent a serious problem that is an emergency. Do not wait to see if the symptoms will go away. Get medical help right away. Call your local emergency services (911 in [place] Do not drive yourself to the hospital.  You have sudden  weakness or numbness of the face, arm, or leg, especially on one side of the body.  You have sudden trouble walking or difficulty moving arms or legs.  You have sudden confusion.  You have trouble speaking (aphasia) or understanding.  You have sudden trouble seeing in one or both eyes.  You have a loss of balance or coordination.  You have a sudden, severe headache with no known cause.  You have new chest pain or an irregular heartbeat.  You have a partial or total loss of consciousness. Document Released: 07/13/2005 Document Revised: 11/27/2013 Document Reviewed: 02/21/2012 [facility] Patient Information 2015 [place], [place]. This information is not intended to replace advice given to you by your health care provider. Make sure you discuss any questions you have with your health care provider.

## 2014-03-08 NOTE — Consult Note (Addendum)
ELECTROPHYSIOLOGY CONSULT NOTE  Patient ID: Traci Mclaughlin MRN: [id number], DOB/AGE: 63-Mar-1952   Admit date: 03/05/2014 Date of Consult: 03/08/2014  Primary Physician: No primary provider on file. Primary Cardiologist: Irish Lack Reason for Consultation: Cryptogenic stroke; recommendations regarding Implantable Loop Recorder  History of Present Illness: Traci Mclaughlin was admitted on 03/05/2014 with aphasia and right sided weakness.  She was also found to have elevated troponin.  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  She has made progress with ambulation but still requires assistance.  She has undergone workup for stroke including echocardiogram and carotid dopplers.  The patient has been monitored on telemetry which has demonstrated sinus rhythm with no arrhythmias.  Inpatient stroke work-up is to be completed with a TEE.   Echocardiogram this admission demonstrated EF 55%, basal inferior and inferolateral hypokinesis, grade 1 diastolic dysfunction, LA 35. Plan is for outpatient stress testing to evaluate wall motion abnormalities. Lab work is reviewed.  Prior to admission, the patient denies chest pain, shortness of breath, dizziness, palpitations, or syncope.  They are recovering from their stroke with plans to go to inpatient rehab at discharge.  EP has been asked to evaluate for placement of an implantable loop recorder to monitor for atrial fibrillation.  ROS is negative except as outlined above.    Past Medical History  Diagnosis Date  . Cancer     cervical; was on chemo; has been in remission for 20yrs  . STEMI (ST elevation myocardial infarction) 06/2011  . Acute kidney injury 07/06/2011  . Hypertension   . HOH (hard of hearing)      Surgical History:  Past Surgical History  Procedure Laterality Date  . Cardiac catheterization  07/03/2011    Dr. Irish Lack     Prescriptions prior to admission  Medication Sig Dispense Refill  . amLODipine  (NORVASC) 5 MG tablet Take 5 mg by mouth daily.      . diclofenac sodium (VOLTAREN) 1 % GEL Apply 4 g topically 4 (four) times daily.      Marland Kitchen HYDROcodone-acetaminophen (NORCO/VICODIN) 5-325 MG per tablet Take 1 tablet by mouth every 6 (six) hours as needed for moderate pain.        Inpatient Medications:  .  stroke: mapping our early stages of recovery book   Does not apply Once  . aspirin  81 mg Oral Daily  . atorvastatin  40 mg Oral q1800  . clopidogrel  75 mg Oral Daily  . metoprolol tartrate  12.5 mg Oral BID    Allergies: No Known Allergies  History   Social History  . Marital Status: Single    Spouse Name: N/A    Number of Children: N/A  . Years of Education: N/A   Occupational History  . Not on file.   Social History Main Topics  . Smoking status: Former Smoker -- 1.00 packs/day for 1 years    Types: Cigarettes  . Smokeless tobacco: Former Systems developer    Quit date: 06/16/2011  . Alcohol Use: Yes     Comment: occassional  . Drug Use: No  . Sexual Activity: Not on file   Other Topics Concern  . Not on file   Social History Narrative  . No narrative on file     Family History  Problem Relation Age of Onset  . Intracerebral hemorrhage Daughter     during childbirth    BP 161/67  Pulse 76  Temp(Src) 98.3 F (36.8 C) (Oral)  Resp 20  Ht 5\' 3"  (1.6  m)  SpO2 95%  Physical Exam: Physical Exam: Filed Vitals:   03/07/14 1900 03/07/14 2138 03/08/14 0140 03/08/14 0849  BP: 139/77 150/73 140/80 161/67  Pulse: 74 70 69 76  Temp: 98.4 F (36.9 C)  98.7 F (37.1 C) 98.3 F (36.8 C)  TempSrc: Oral  Oral Oral  Resp: 20  20 20   Height:      SpO2: 99%  99% 95%    GEN- The patient is well appearing, alert, expressive aphasia noted Head- normocephalic, atraumatic Eyes-  Sclera clear, conjunctiva pink Ears- poor hearing Oropharynx- clear Neck- supple, Lungs- Clear to ausculation bilaterally, normal work of breathing Heart- Regular rate and rhythm  GI- soft, NT,  ND, + BS Extremities- no clubbing, cyanosis, or edema, groin is without hematoma/ bruit MS- no significant deformity or atrophy Skin- no rash or lesion Psych- euthymic mood, full affect   Labs:   Lab Results  Component Value Date   WBC 9.8 03/05/2014   HGB 14.9 03/05/2014   HCT 43.6 03/05/2014   MCV 98.4 03/05/2014   PLT PLATELETS APPEAR DECREASED 03/05/2014    Recent Labs Lab 03/05/14 1508  NA 140  K 4.1  CL 102  CO2 22  BUN 14  CREATININE 1.10  CALCIUM 9.2  PROT 7.4  BILITOT 0.5  ALKPHOS 60  ALT 15  AST 24  GLUCOSE 93     Radiology/Studies: Dg Knee 2 Views Left 02/28/2014   CLINICAL DATA:  LEG PAIN LEG PAIN  EXAM: LEFT KNEE - 1-2 VIEW  COMPARISON:  None.  FINDINGS: There is no evidence of fracture, dislocation, or joint effusion. There is no evidence of arthropathy or other focal bone abnormality. Soft tissues are unremarkable.  IMPRESSION: Negative.   Electronically Signed   By: Arne Cleveland M.D.   On: 02/28/2014 19:50   Ct Head (brain) Wo Contrast 03/05/2014   CLINICAL DATA:  Facial droop and dysarthria ; right-sided weakness  EXAM: CT HEAD WITHOUT CONTRAST  TECHNIQUE: Contiguous axial images were obtained from the base of the skull through the vertex without intravenous contrast.  COMPARISON:  None.  FINDINGS: The ventricles are normal in size and configuration. There is no demonstrable mass, hemorrhage, extra-axial fluid collection, or midline shift. There is decreased attenuation in the superior left temporal lobe with involvement of the left extreme capsule and insular cortex as well consistent with an acute infarct with focal cytotoxic edema in this area. This recent appearing infarct extends more superiorly to involve a portion of the posterior left frontal lobe.  Bony calvarium appears intact. The mastoid air cells are clear. There is nasal turbinate edema bilaterally. There is also decreased attenuation in a portion of the medial left occipital lobe which appears more  chronic and may represent an older infarct. Elsewhere gray-white compartments appear unremarkable.  IMPRESSION: Acute infarct involving portions of the superior left temporal lobe as well as portions of the posterior left frontal lobe. There is also involvement of the left extreme capsule and insular cortex. There is cytotoxic edema in these areas.  There is decreased attenuation in the medial left occipital lobe with sparing of the post rib medial aspect of the left occipital lobe. Suspect older infarct in this area. There is no acute hemorrhage. There is no mass or midline shift. There is diffuse nasal turbinate edema bilaterally.   Electronically Signed   By: Lowella Grip M.D.   On: 03/05/2014 14:29   Mr Brain Wo Contrast 03/06/2014   CLINICAL DATA:  Right-sided  weakness, speech difficulty. Acute left middle cerebral artery territory infarct.  EXAM: MRI HEAD WITHOUT CONTRAST  MRA HEAD WITHOUT CONTRAST  TECHNIQUE: Multiplanar, multiecho pulse sequences of the brain and surrounding structures were obtained without intravenous contrast. Angiographic images of the head were obtained using MRA technique without contrast.  COMPARISON:  CT of the head March 05, 2014 at 1408 hr  FINDINGS: MRI HEAD FINDINGS  Reduced diffusion within left frontal lobe with corresponding low ADC values. Mild expansile T2 hyperintense signal within the corresponding cortex. Local mass effect without midline shift. No susceptibility artifact to suggest hemorrhagic conversion.  Left mesial occipital lobe transcortical encephalomalacia with mild ex vacuo dilatation of the left occipital horn. The ventricles and sulci are otherwise normal for patient's age. A few scattered sub cm supratentorial white matter T2 hyperintensities, exclusive of the aforementioned acute does infarct suggests chronic small vessel ischemic disease.  No abnormal extra-axial fluid collections. No abnormal sellar expansion. No cerebellar tonsillar ectopia. Trace  paranasal sinus mucosal thickening with small right maxillary mucous retention cyst, the mastoid air cells appear well-aerated. Ocular globes and orbital contents are unremarkable. No suspicious calvarial bone marrow signal.  MRA HEAD FINDINGS  Anterior circulation: Normal flow related and enhancement of the included cervical, petrous, cavernous and supra clinoid internal carotid arteries. Normal flow related enhancement of the left M1 segment and left middle cerebral artery bifurcation. Meniscus and occlusion of left superior M2 branch, axial 42/126. Normal flow related enhancement of the anterior cerebral arteries and right middle cerebral artery.  Posterior circulation: Left vertebral artery is dominant. Normal flow related enhancement of vertebral basilar system, patent main branch vessels. Small right posterior communicating artery present. Thready left P2 segment with absent left P3 flow related enhancement.  No aneurysm.  IMPRESSION: MRI head: Acute large left middle cerebral artery territory infarct.  Remote left posterior cerebral artery territory infarct.  MRA head: Occluded left M2 superior branch consistent with acute thromboembolic disease.  Occluded versus less likely slow flow left P3 segment, thready left P2 segment.   Electronically Signed   By: Elon Alas   On: 03/06/2014 01:00   12-lead ECG sinus rhythm, RBBB, rate 65, QRS 162  Telemetry sinus rhythm with PAC's, RBBB  Assessment and Plan:  1. Cryptogenic stroke The patient presents with cryptogenic stroke.  The patient has a TEE planned for today.  I spoke at length with the patient about monitoring for afib with either a 30 day event monitor or an implantable loop recorder.  Risks, benefits, and alteratives to implantable loop recorder were discussed with the patient today.   At this time, the patient is very clear in their decision to proceed with implantable loop recorder.   We will proceed with ILR pending results of her  TEE.  Please call with questions.  Addendum Plans per vascular noted Would defer implantable loop recorder implant at this time.  Could reconsider just prior to discharge  Neurology is aware.

## 2014-03-08 NOTE — Progress Notes (Signed)
Speech Language Pathology Treatment: Cognitive-Linquistic  Patient Details Name: Traci Mclaughlin MRN: [id number] DOB: 03/12/1951 Today's Date: 03/08/2014 Time: 8850-2774 SLP Time Calculation (min): 11 min  Assessment / Plan / Recommendation Clinical Impression  Pt was seen for aphasia tx; dysphagia tx was held due to pt being NPO for procedure. Pt responded to one-step instructions with Min cues from SLP. Pt wrote her first name independently, and demonstrated adequate emergent awareness of difficulty writing last name. Pt was unable to copy her name from written model, so SLP presented one letter at a time, which increased accuracy with copying to ~75%. Pt spontaneously verbalized "I know what I want to say but I can't say it." SLP provided words of encouragement, both verbally for lipreading and in writing. Session ended due to pt being transferred off the unit for TEE. Will continue to follow as able.   HPI HPI: Traci Mclaughlin is a 63 y.o. female who presented 03/05/14 with right-sided weakness, slurred speech, and difficulty speaking x1 day. MRI revealed a significant left MCA infarction.   Pertinent Vitals Pain Assessment: No/denies pain Faces Pain Scale: Hurts whole lot Pain Location: L ankle Pain Descriptors / Indicators: Other (Comment) (unable to communicate well due to CVA) Pain Intervention(s): Limited activity within patient's tolerance;Other (comment) (Identified to nsg to medicate)  SLP Plan  Continue with current plan of care    Recommendations Diet recommendations: Dysphagia 2 (fine chop);Thin liquid Liquids provided via: Cup;Straw Medication Administration: Whole meds with puree Supervision: Patient able to self feed;Full supervision/cueing for compensatory strategies Compensations: Slow rate;Small sips/bites;Check for pocketing Postural Changes and/or Swallow Maneuvers: Seated upright 90 degrees              Oral Care Recommendations: Oral care BID Follow up  Recommendations: Inpatient Rehab;24 hour supervision/assistance Plan: Continue with current plan of care    GO      Germain Osgood, M.A. CCC-SLP [phone]  Germain Osgood 03/08/2014, 2:22 PM

## 2014-03-08 NOTE — Op Note (Signed)
Full report to follow in CV section of chart 

## 2014-03-08 NOTE — Interval H&P Note (Signed)
History and Physical Interval Note:  03/08/2014 1:24 PM  Traci Mclaughlin  has presented today for surgery, with the diagnosis of stroke  The various methods of treatment have been discussed with the patient and family. After consideration of risks, benefits and other options for treatment, the patient has consented to  Procedure(s): TRANSESOPHAGEAL ECHOCARDIOGRAM (TEE) (N/A) as a surgical intervention .  The patient's history has been reviewed, patient examined, no change in status, stable for surgery.  I have reviewed the patient's chart and labs.  Questions were answered to the patient's satisfaction.     Dorris Carnes

## 2014-03-08 NOTE — Consult Note (Signed)
Hospital Consult    Reason for Consult:  Ischemic left 5th toe MRN #:  [id number]  History of Present Illness: This is a 63 y.o. 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  During her hospital stay, she has complaints of left foot pain that she and her daughter say she has complained about for a month or two.  Her daughter states that she took her to the urgent care in [facility] for this and a venous doppler was completed, which was negative for DVT.  She was brought to the ER on a separate occasion for the same complaint where her daughter states she got an xray and some topical ointment for her foot pain.  She states that she has pain in her foot all the time.  It feels better elevated. She says that she does have pain in her calf, but difficult to determine her ambulatory status before hospitalization.    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  Past Medical History  Diagnosis Date  . Cancer     cervical; was on chemo; has been in remission for 60yrs  . STEMI (ST elevation myocardial infarction) 06/2011  . Acute kidney injury 07/06/2011  . Hypertension   . HOH (hard of hearing)    Past Surgical History  Procedure Laterality Date  . Cardiac catheterization  07/03/2011    Dr. Irish Lack    No Known Allergies  Prior to Admission medications   Medication Sig Start Date End Date Taking? Authorizing Provider  amLODipine (NORVASC) 5 MG tablet Take 5 mg by mouth daily.   Yes Historical Provider, MD  diclofenac sodium (VOLTAREN) 1 % GEL Apply 4 g topically 4 (four) times daily.   Yes Historical Provider, MD  HYDROcodone-acetaminophen (NORCO/VICODIN) 5-325 MG per tablet Take 1 tablet by mouth every 6 (six) hours as needed for moderate pain.   Yes Historical Provider, MD    History   Social History  . Marital Status: Single    Spouse Name: N/A    Number of Children: N/A  . Years of Education:  N/A   Occupational History  . Not on file.   Social History Main Topics  . Smoking status: Former Smoker -- 1.00 packs/day for 1 years    Types: Cigarettes  . Smokeless tobacco: Former Systems developer    Quit date: 06/16/2011  . Alcohol Use: Yes     Comment: occassional  . Drug Use: No  . Sexual Activity: Not on file   Other Topics Concern  . Not on file   Social History Narrative  . No narrative on file     Family History  Problem Relation Age of Onset  . Intracerebral hemorrhage Daughter     during childbirth    ROS: [x]  Positive   [ ]  Negative   [ ]  All sytems reviewed and are negative  Cardiovascular: []  chest pain/pressure []  palpitations []  SOB lying flat []  DOE []  pain in legs while walking [x]  pain in left foot []  pain in legs at night []  non-healing ulcers []  hx of DVT []  swelling in legs  Pulmonary: []  productive cough []  asthma/wheezing []  home O2  Neurologic: []  weakness in []  arms []  legs []  numbness in []  arms []  legs [x]  hx of CVA []  mini stroke [x] difficulty speaking or slurred speech []  temporary loss of vision in one eye []  dizziness  Hematologic: []   hx of cancer []  bleeding problems []  problems with blood clotting easily  Endocrine:   []  diabetes []  thyroid disease  GI []  vomiting blood []  blood in stool  GU: []  CKD/renal failure []  HD--[]  M/W/F or []  T/T/S []  burning with urination []  blood in urine  Psychiatric: []  anxiety []  depression  Musculoskeletal: []  arthritis []  joint pain  Integumentary: []  rashes []  ulcers  Constitutional: []  fever []  chills   Physical Examination  Filed Vitals:   03/08/14 0849  BP: 161/67  Pulse: 76  Temp: 98.3 F (36.8 C)  Resp: 20   There is no weight on file to calculate BMI.  General:  WDWN in NAD Gait: Not observed HENT: WNL, normocephalic Pulmonary: normal non-labored breathing, without Rales, rhonchi,  wheezing Cardiac: regular, without  Murmurs, rubs or gallops;  without carotid bruits Abdomen: soft, NT/ND, no masses Skin: without rashes, without ulcers  Vascular Exam/Pulses:  Right Left  Radial 2+ (normal) 2+ (normal)  Ulnar Unable to palpate Unable to palpate  Femoral Unable to palpate Unable to palpate  Popliteal Unable to palpate Unable to palpate  DP + brisk doppler signal No doppler signal present  PT + doppler signal Trace doppler signal   Extremities: with ischemic changes, without Gangrene , without cellulitis; without open wounds; she also has discoloration on the lateral aspect of the left heel Musculoskeletal: no muscle wasting or atrophy  Neurologic:  Right sided weakness; difficulty speaking   CBC    Component Value Date/Time   WBC 9.8 03/05/2014 1332   RBC 4.43 03/05/2014 1332   HGB 14.9 03/05/2014 1332   HCT 43.6 03/05/2014 1332   PLT PLATELETS APPEAR DECREASED 03/05/2014 1332   MCV 98.4 03/05/2014 1332   MCH 33.6 03/05/2014 1332   MCHC 34.2 03/05/2014 1332   RDW 12.8 03/05/2014 1332   LYMPHSABS 1.5 03/05/2014 1332   MONOABS 0.7 03/05/2014 1332   EOSABS 0.1 03/05/2014 1332   BASOSABS 0.0 03/05/2014 1332    BMET    Component Value Date/Time   NA 140 03/05/2014 1508   K 4.1 03/05/2014 1508   CL 102 03/05/2014 1508   CO2 22 03/05/2014 1508   GLUCOSE 93 03/05/2014 1508   BUN 14 03/05/2014 1508   CREATININE 1.10 03/05/2014 1508   CALCIUM 9.2 03/05/2014 1508   GFRNONAA 52* 03/05/2014 1508   GFRAA 61* 03/05/2014 1508    COAGS: Lab Results  Component Value Date   INR 1.07 03/05/2014   INR 1.15 07/03/2011     Non-Invasive Vascular Imaging:  Carotid duplex 03/06/14: - The vertebral arteries appear patent with antegrade flow. - Findings consistent with 1-39 percent stenosis involving the right internal carotid artery and the left internal carotid artery.  Statin:  Yes.   Beta Blocker:  Yes.   Aspirin:  Yes.   ACEI:  No. ARB:  No. Other antiplatelets/anticoagulants:  Yes.  -Plavix   ASSESSMENT/PLAN: This is a 63 y.o. female  who was admitted with left CVA as well as ischemic changes to left foot.   -pt is deaf and communicates reading lips and writing on paper. -pt has had chronic left foot pain for ~1-2 months.  She has a faint left PT and peroneal.  I could not appreciate a doppler signal of the left DP. -she may need a CTA with runoff to evaluate her aorta and lower extremity anatomy vs arteriogram with runoff.  I had difficulty palpating her femoral pulses bilaterally. -i did discuss this with the pt via writing  on paper as well as her family -I also let her and her family know that since she is going for a TEE today, Dr. Scot Dock may see her tomorrow as she will get sedating medication. -she has been started on a statin, asa -ABI's and a lower extremity arterial duplex have been ordered.   Leontine Locket, PA-C [facility] [phone]  Agree with above. She does have a palpable right femoral pulse but I cannot palpate a left femoral pulse. She has markedly diminished Doppler flow in the left foot. Her ABIs are pending. I would recommend that she have an arteriogram to further evaluate her multilevel disease on the left. We could potentially proceed tomorrow if she is medically stable. Her creatinine is 1.1.  Deitra Mayo, MD, [place] [phone] 03/08/2014

## 2014-03-08 NOTE — Care Management Note (Addendum)
    Page 1 of 1   03/12/2014     5:00:09 PM CARE MANAGEMENT NOTE 03/12/2014  Patient:  [facility]   Account Number:  [id number]  Date Initiated:  03/06/2014  Documentation initiated by:  Lorne Skeens  Subjective/Objective Assessment:   Patient was admitted with CVA. Lives at home with children.     Action/Plan:   Will follow for discharge needs pending PT/OT evals and physician orders.   Anticipated DC Date:  03/12/2014   Anticipated DC Plan:  IP REHAB FACILITY      DC Planning Services  CM consult      Choice offered to / List presented to:             Status of service:  Completed, signed off Medicare Important Message given?  YES (If response is "NO", the following Medicare IM given date fields will be blank) Date Medicare IM given:  03/08/2014 Medicare IM given by:  Lorne Skeens Date Additional Medicare IM given:  03/12/2014 Additional Medicare IM given by:  Semira Stoltzfus  Discharge Disposition:  IP REHAB FACILITY  Per UR Regulation:  Reviewed for med. necessity/level of care/duration of stay  If discussed at [place] of Stay Meetings, dates discussed:    Comments:   03/12/14 Ellan Lambert, RN, BSN [phone] Pt discharging to IP rehab unit today.  03/08/14 [place] RN,MSN, CM- Medicare IM letter provided

## 2014-03-08 NOTE — Progress Notes (Addendum)
  Date: 03/08/2014  Patient name: Traci Mclaughlin  Medical record number: [id number]  Date of birth: 09-02-1950   This patient has been seen and the plan of care was discussed with the house staff. Please see their note for complete details. I concur with their findings with the following additions/corrections:  Traci Mclaughlin continues to undergo stroke work up for [facility] infarct thought to be embolic. Awaiting for TEE and loop monitor today. Physical exam shows  painful cool 5th toe on left foot that will need to have vascular surgery to evaluate for ischemia possibly due to embolic stroke. She did come in with reported 1-2 month history of left foot pain so this possibly is chronic.  Traci Basques, MD 03/08/2014, 12:27 PM

## 2014-03-08 NOTE — H&P (View-Only) (Signed)
DAILY PROGRESS NOTE  Subjective:  No chest pain.  Echo yesterday shows EF of 55%, however, there are wall motion abnormalities, specifically basal inferior and inferolateral hypokinesis with mid to apical inferolateral akinesis. 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  Objective:  Temp:  [98 F (36.7 C)-99.8 F (37.7 C)] 98.6 F (37 C) (08/12 1011) Pulse Rate:  [81-92] 84 (08/12 1011) Resp:  [18-20] 20 (08/12 1011) BP: (127-159)/(74-89) 138/75 mmHg (08/12 1011) SpO2:  [93 %-99 %] 98 % (08/12 1011) Weight change:   Intake/Output from previous day:    Intake/Output from this shift:    Medications: Current Facility-Administered Medications  Medication Dose Route Frequency Provider Last Rate Last Dose  .  stroke: mapping our early stages of recovery book   Does not apply Once Wilber Oliphant, MD      . aspirin suppository 300 mg  300 mg Rectal Daily Wilber Oliphant, MD   300 mg at 03/06/14 1051   Or  . aspirin tablet 325 mg  325 mg Oral Daily Wilber Oliphant, MD   325 mg at 03/07/14 1002  . atorvastatin (LIPITOR) tablet 40 mg  40 mg Oral q1800 Pixie Casino, MD   40 mg at 03/06/14 1802    Physical Exam: General appearance: alert and no distress Lungs: clear to auscultation bilaterally Heart: regular rate and rhythm, S1, S2 normal, no murmur, click, rub or gallop Extremities: extremities normal, atraumatic, no cyanosis or edema Neurologic: Mental status: Alert, expressive aphasia  Lab Results: Results for orders placed during the hospital encounter of 03/05/14 (from the past 48 hour(s))  CBC     Status: None   Collection Time    03/05/14  1:32 PM      Result Value Ref Range   WBC 9.8  4.0 - 10.5 K/uL   Comment: WHITE COUNT CONFIRMED ON SMEAR   RBC 4.43  3.87 - 5.11 MIL/uL   Hemoglobin 14.9  12.0 - 15.0 g/dL   HCT 43.6  36.0 - 46.0 %   MCV 98.4  78.0 - 100.0 fL   MCH 33.6  26.0 - 34.0 pg   MCHC 34.2  30.0 - 36.0 g/dL   RDW 12.8  11.5 -  15.5 %   Platelets PLATELETS APPEAR DECREASED  150 - 400 K/uL  DIFFERENTIAL     Status: None   Collection Time    03/05/14  1:32 PM      Result Value Ref Range   Neutrophils Relative % 77  43 - 77 %   Lymphocytes Relative 15  12 - 46 %   Monocytes Relative 7  3 - 12 %   Eosinophils Relative 1  0 - 5 %   Basophils Relative 0  0 - 1 %   Neutro Abs 7.5  1.7 - 7.7 K/uL   Lymphs Abs 1.5  0.7 - 4.0 K/uL   Monocytes Absolute 0.7  0.1 - 1.0 K/uL   Eosinophils Absolute 0.1  0.0 - 0.7 K/uL   Basophils Absolute 0.0  0.0 - 0.1 K/uL   Smear Review       Value: PLATELET CLUMPS NOTED ON SMEAR, COUNT APPEARS DECREASED  CBG MONITORING, ED     Status: Abnormal   Collection Time    03/05/14  2:26 PM      Result Value Ref Range   Glucose-Capillary 101 (*) 70 - 99 mg/dL  PROTIME-INR     Status: None   Collection Time  03/05/14  3:08 PM      Result Value Ref Range   Prothrombin Time 13.9  11.6 - 15.2 seconds   INR 1.07  0.00 - 1.49  APTT     Status: Abnormal   Collection Time    03/05/14  3:08 PM      Result Value Ref Range   aPTT 40 (*) 24 - 37 seconds   Comment:            IF BASELINE aPTT IS ELEVATED,     SUGGEST PATIENT RISK ASSESSMENT     BE USED TO DETERMINE APPROPRIATE     ANTICOAGULANT THERAPY.  COMPREHENSIVE METABOLIC PANEL     Status: Abnormal   Collection Time    03/05/14  3:08 PM      Result Value Ref Range   Sodium 140  137 - 147 mEq/L   Potassium 4.1  3.7 - 5.3 mEq/L   Chloride 102  96 - 112 mEq/L   CO2 22  19 - 32 mEq/L   Glucose, Bld 93  70 - 99 mg/dL   BUN 14  6 - 23 mg/dL   Creatinine, Ser 1.10  0.50 - 1.10 mg/dL   Calcium 9.2  8.4 - 10.5 mg/dL   Total Protein 7.4  6.0 - 8.3 g/dL   Albumin 3.8  3.5 - 5.2 g/dL   AST 24  0 - 37 U/L   ALT 15  0 - 35 U/L   Alkaline Phosphatase 60  39 - 117 U/L   Total Bilirubin 0.5  0.3 - 1.2 mg/dL   GFR calc non Af Amer 52 (*) >90 mL/min   GFR calc Af Amer 61 (*) >90 mL/min   Comment: (NOTE)     The eGFR has been calculated  using the CKD EPI equation.     This calculation has not been validated in all clinical situations.     eGFR's persistently <90 mL/min signify possible Chronic Kidney     Disease.   Anion gap 16 (*) 5 - 15  I-STAT TROPOININ, ED     Status: Abnormal   Collection Time    03/05/14  3:45 PM      Result Value Ref Range   Troponin i, poc 0.23 (*) 0.00 - 0.08 ng/mL   Comment NOTIFIED PHYSICIAN     Comment 3            Comment: Due to the release kinetics of cTnI,     a negative result within the first hours     of the onset of symptoms does not rule out     myocardial infarction with certainty.     If myocardial infarction is still suspected,     repeat the test at appropriate intervals.  HIV ANTIBODY (ROUTINE TESTING)     Status: None   Collection Time    03/05/14  5:10 PM      Result Value Ref Range   HIV 1&2 Ab, 4th Generation NONREACTIVE  NONREACTIVE   Comment: (NOTE)     A NONREACTIVE HIV Ag/Ab result does not exclude HIV infection since     the time frame for seroconversion is variable. If acute HIV infection     is suspected, a HIV-1 RNA Qualitative TMA test is recommended.     HIV-1/2 Antibody Diff         Not indicated.     HIV-1 RNA, Qual TMA           Not indicated.  PLEASE NOTE: This information has been disclosed to you from records     whose confidentiality may be protected by state law. If your state     requires such protection, then the state law prohibits you from making     any further disclosure of the information without the specific written     consent of the person to whom it pertains, or as otherwise permitted     by law. A general authorization for the release of medical or other     information is NOT sufficient for this purpose.     The performance of this assay has not been clinically validated in     patients less than 50 years old.     Performed at [facility]  TROPONIN I     Status: Abnormal   Collection Time    03/05/14  5:25 PM       Result Value Ref Range   Troponin I 0.37 (*) <0.30 ng/mL   Comment:            Due to the release kinetics of cTnI,     a negative result within the first hours     of the onset of symptoms does not rule out     myocardial infarction with certainty.     If myocardial infarction is still suspected,     repeat the test at appropriate intervals.     CRITICAL RESULT CALLED TO, READ BACK BY AND VERIFIED WITH:     E ALI,RN 1826 03/05/14 WBOND  URINALYSIS, ROUTINE W REFLEX MICROSCOPIC     Status: Abnormal   Collection Time    03/05/14 10:37 PM      Result Value Ref Range   Color, Urine YELLOW  YELLOW   APPearance CLEAR  CLEAR   Specific Gravity, Urine 1.010  1.005 - 1.030   pH 7.5  5.0 - 8.0   Glucose, UA NEGATIVE  NEGATIVE mg/dL   Hgb urine dipstick NEGATIVE  NEGATIVE   Bilirubin Urine NEGATIVE  NEGATIVE   Ketones, ur 15 (*) NEGATIVE mg/dL   Protein, ur NEGATIVE  NEGATIVE mg/dL   Urobilinogen, UA 1.0  0.0 - 1.0 mg/dL   Nitrite NEGATIVE  NEGATIVE   Leukocytes, UA NEGATIVE  NEGATIVE   Comment: MICROSCOPIC NOT DONE ON URINES WITH NEGATIVE PROTEIN, BLOOD, LEUKOCYTES, NITRITE, OR GLUCOSE <1000 mg/dL.  URINE RAPID DRUG SCREEN (HOSP PERFORMED)     Status: Abnormal   Collection Time    03/05/14 10:37 PM      Result Value Ref Range   Opiates NONE DETECTED  NONE DETECTED   Cocaine NONE DETECTED  NONE DETECTED   Benzodiazepines NONE DETECTED  NONE DETECTED   Amphetamines NONE DETECTED  NONE DETECTED   Tetrahydrocannabinol POSITIVE (*) NONE DETECTED   Barbiturates NONE DETECTED  NONE DETECTED   Comment:            DRUG SCREEN FOR MEDICAL PURPOSES     ONLY.  IF CONFIRMATION IS NEEDED     FOR ANY PURPOSE, NOTIFY LAB     WITHIN 5 DAYS.                LOWEST DETECTABLE LIMITS     FOR URINE DRUG SCREEN     Drug Class       Cutoff (ng/mL)     Amphetamine      1000     Barbiturate      200     Benzodiazepine  035     Tricyclics       597     Opiates          300     Cocaine          300       THC              50  LIPID PANEL     Status: Abnormal   Collection Time    03/05/14 10:40 PM      Result Value Ref Range   Cholesterol 191  0 - 200 mg/dL   Triglycerides 106  <150 mg/dL   HDL 48  >39 mg/dL   Total CHOL/HDL Ratio 4.0     VLDL 21  0 - 40 mg/dL   LDL Cholesterol 122 (*) 0 - 99 mg/dL   Comment:            Total Cholesterol/HDL:CHD Risk     Coronary Heart Disease Risk Table                         Men   Women      1/2 Average Risk   3.4   3.3      Average Risk       5.0   4.4      2 X Average Risk   9.6   7.1      3 X Average Risk  23.4   11.0                Use the calculated Patient Ratio     above and the CHD Risk Table     to determine the patient's CHD Risk.                ATP III CLASSIFICATION (LDL):      <100     mg/dL   Optimal      100-129  mg/dL   Near or Above                        Optimal      130-159  mg/dL   Borderline      160-189  mg/dL   High      >190     mg/dL   Very High  TROPONIN I     Status: Abnormal   Collection Time    03/05/14 10:45 PM      Result Value Ref Range   Troponin I 0.57 (*) <0.30 ng/mL   Comment:            Due to the release kinetics of cTnI,     a negative result within the first hours     of the onset of symptoms does not rule out     myocardial infarction with certainty.     If myocardial infarction is still suspected,     repeat the test at appropriate intervals.     CRITICAL VALUE NOTED.  VALUE IS CONSISTENT WITH PREVIOUSLY REPORTED AND CALLED VALUE.  HEMOGLOBIN A1C     Status: Abnormal   Collection Time    03/05/14 10:45 PM      Result Value Ref Range   Hemoglobin A1C 5.8 (*) <5.7 %   Comment: (NOTE)  According to the ADA Clinical Practice Recommendations for 2011, when     HbA1c is used as a screening test:      >=6.5%   Diagnostic of Diabetes Mellitus               (if abnormal result is confirmed)     5.7-6.4%   Increased  risk of developing Diabetes Mellitus     References:Diagnosis and Classification of Diabetes Mellitus,Diabetes     NATF,5732,20(URKYH 1):S62-S69 and Standards of Medical Care in             Diabetes - 2011,Diabetes CWCB,7628,31 (Suppl 1):S11-S61.   Mean Plasma Glucose 120 (*) <117 mg/dL   Comment: Performed at [facility]  TROPONIN I     Status: Abnormal   Collection Time    03/06/14  5:10 AM      Result Value Ref Range   Troponin I 0.43 (*) <0.30 ng/mL   Comment:            Due to the release kinetics of cTnI,     a negative result within the first hours     of the onset of symptoms does not rule out     myocardial infarction with certainty.     If myocardial infarction is still suspected,     repeat the test at appropriate intervals.     CRITICAL VALUE NOTED.  VALUE IS CONSISTENT WITH PREVIOUSLY REPORTED AND CALLED VALUE.    Imaging: Ct Head (brain) Wo Contrast  03/05/2014   CLINICAL DATA:  Facial droop and dysarthria ; right-sided weakness  EXAM: CT HEAD WITHOUT CONTRAST  TECHNIQUE: Contiguous axial images were obtained from the base of the skull through the vertex without intravenous contrast.  COMPARISON:  None.  FINDINGS: The ventricles are normal in size and configuration. There is no demonstrable mass, hemorrhage, extra-axial fluid collection, or midline shift. There is decreased attenuation in the superior left temporal lobe with involvement of the left extreme capsule and insular cortex as well consistent with an acute infarct with focal cytotoxic edema in this area. This recent appearing infarct extends more superiorly to involve a portion of the posterior left frontal lobe.  Bony calvarium appears intact. The mastoid air cells are clear. There is nasal turbinate edema bilaterally. There is also decreased attenuation in a portion of the medial left occipital lobe which appears more chronic and may represent an older infarct. Elsewhere gray-white compartments appear  unremarkable.  IMPRESSION: Acute infarct involving portions of the superior left temporal lobe as well as portions of the posterior left frontal lobe. There is also involvement of the left extreme capsule and insular cortex. There is cytotoxic edema in these areas.  There is decreased attenuation in the medial left occipital lobe with sparing of the post rib medial aspect of the left occipital lobe. Suspect older infarct in this area. There is no acute hemorrhage. There is no mass or midline shift. There is diffuse nasal turbinate edema bilaterally.   Electronically Signed   By: Lowella Grip M.D.   On: 03/05/2014 14:29   Mr Brain Wo Contrast  03/06/2014   CLINICAL DATA:  Right-sided weakness, speech difficulty. Acute left middle cerebral artery territory infarct.  EXAM: MRI HEAD WITHOUT CONTRAST  MRA HEAD WITHOUT CONTRAST  TECHNIQUE: Multiplanar, multiecho pulse sequences of the brain and surrounding structures were obtained without intravenous contrast. Angiographic images of the head were obtained using MRA technique without contrast.  COMPARISON:  CT of the head March 05, 2014 at 1408 hr  FINDINGS: MRI HEAD FINDINGS  Reduced diffusion within left frontal lobe with corresponding low ADC values. Mild expansile T2 hyperintense signal within the corresponding cortex. Local mass effect without midline shift. No susceptibility artifact to suggest hemorrhagic conversion.  Left mesial occipital lobe transcortical encephalomalacia with mild ex vacuo dilatation of the left occipital horn. The ventricles and sulci are otherwise normal for patient's age. A few scattered sub cm supratentorial white matter T2 hyperintensities, exclusive of the aforementioned acute does infarct suggests chronic small vessel ischemic disease.  No abnormal extra-axial fluid collections. No abnormal sellar expansion. No cerebellar tonsillar ectopia. Trace paranasal sinus mucosal thickening with small right maxillary mucous retention cyst,  the mastoid air cells appear well-aerated. Ocular globes and orbital contents are unremarkable. No suspicious calvarial bone marrow signal.  MRA HEAD FINDINGS  Anterior circulation: Normal flow related and enhancement of the included cervical, petrous, cavernous and supra clinoid internal carotid arteries. Normal flow related enhancement of the left M1 segment and left middle cerebral artery bifurcation. Meniscus and occlusion of left superior M2 branch, axial 42/126. Normal flow related enhancement of the anterior cerebral arteries and right middle cerebral artery.  Posterior circulation: Left vertebral artery is dominant. Normal flow related enhancement of vertebral basilar system, patent main branch vessels. Small right posterior communicating artery present. Thready left P2 segment with absent left P3 flow related enhancement.  No aneurysm.  IMPRESSION: MRI head: Acute large left middle cerebral artery territory infarct.  Remote left posterior cerebral artery territory infarct.  MRA head: Occluded left M2 superior branch consistent with acute thromboembolic disease.  Occluded versus less likely slow flow left P3 segment, thready left P2 segment.   Electronically Signed   By: Elon Alas   On: 03/06/2014 01:00   Dg Chest Port 1 View  03/05/2014   CLINICAL DATA:  LEFT side stroke, weakness, slurred speech, past history MI, cervical cancer, hypertension  EXAM: PORTABLE CHEST - 1 VIEW  COMPARISON:  Portable exam 1455 hr compared to 07/03/2011  FINDINGS: Upper normal heart size.  Normal mediastinal contours and pulmonary vascularity.  Slight rotation to the LEFT.  Lungs grossly clear.  No pleural effusion, pneumothorax or acute osseous findings.  IMPRESSION: No acute abnormalities.   Electronically Signed   By: Lavonia Dana M.D.   On: 03/05/2014 15:01   Mr Jodene Nam Head/brain Wo Cm  03/06/2014   CLINICAL DATA:  Right-sided weakness, speech difficulty. Acute left middle cerebral artery territory infarct.  EXAM:  MRI HEAD WITHOUT CONTRAST  MRA HEAD WITHOUT CONTRAST  TECHNIQUE: Multiplanar, multiecho pulse sequences of the brain and surrounding structures were obtained without intravenous contrast. Angiographic images of the head were obtained using MRA technique without contrast.  COMPARISON:  CT of the head March 05, 2014 at 1408 hr  FINDINGS: MRI HEAD FINDINGS  Reduced diffusion within left frontal lobe with corresponding low ADC values. Mild expansile T2 hyperintense signal within the corresponding cortex. Local mass effect without midline shift. No susceptibility artifact to suggest hemorrhagic conversion.  Left mesial occipital lobe transcortical encephalomalacia with mild ex vacuo dilatation of the left occipital horn. The ventricles and sulci are otherwise normal for patient's age. A few scattered sub cm supratentorial white matter T2 hyperintensities, exclusive of the aforementioned acute does infarct suggests chronic small vessel ischemic disease.  No abnormal extra-axial fluid collections. No abnormal sellar expansion. No cerebellar tonsillar ectopia. Trace paranasal sinus mucosal thickening with small right maxillary mucous retention cyst, the mastoid air cells appear  well-aerated. Ocular globes and orbital contents are unremarkable. No suspicious calvarial bone marrow signal.  MRA HEAD FINDINGS  Anterior circulation: Normal flow related and enhancement of the included cervical, petrous, cavernous and supra clinoid internal carotid arteries. Normal flow related enhancement of the left M1 segment and left middle cerebral artery bifurcation. Meniscus and occlusion of left superior M2 branch, axial 42/126. Normal flow related enhancement of the anterior cerebral arteries and right middle cerebral artery.  Posterior circulation: Left vertebral artery is dominant. Normal flow related enhancement of vertebral basilar system, patent main branch vessels. Small right posterior communicating artery present. Thready left P2  segment with absent left P3 flow related enhancement.  No aneurysm.  IMPRESSION: MRI head: Acute large left middle cerebral artery territory infarct.  Remote left posterior cerebral artery territory infarct.  MRA head: Occluded left M2 superior branch consistent with acute thromboembolic disease.  Occluded versus less likely slow flow left P3 segment, thready left P2 segment.   Electronically Signed   By: Elon Alas   On: 03/06/2014 01:00    Assessment:  Principal Problem:   Stroke Active Problems:   Elevation of cardiac enzymes   CKD (chronic kidney disease) stage 3, GFR 30-59 ml/min   Hypertension   NSTEMI (non-ST elevated myocardial infarction)   CVA (cerebral infarction)   Right hemiplegia   Plan:  Echo demonstrates wall motion abnormalities consistent with infarct/ischemia. She is not complaining of chest pain. There are no signs of ongoing ischemia. Given her large recent stroke, cardiac catheterization is too high risk at this time and the risk/benefit ratio is not favorable for this. I would recommend medical therapy. Would consider an outpatient lexiscan myoview after discharge to evaluate for any potential reversible ischemia. High risk findings would probably warrant a catheterization, but likely at least 1 month after her stroke. D/w Dr. Leonie Man, he is okay with adding plavix dependent on her TEE results. He would like her to have TEE/Loop recorder tomorrow. Also ok to add b-blocker from his standpoint.  Time Spent Directly with Patient:  15 minutes  Length of Stay:  LOS: 2 days   Pixie Casino, MD, [facility] Attending Cardiologist [facility]  HILTY,Kenneth C 03/07/2014, 11:19 AM

## 2014-03-08 NOTE — Progress Notes (Signed)
OT Cancellation Note  Patient Details Name: Traci Mclaughlin MRN: [id number] DOB: 10-15-1950   Cancelled Treatment:    Reason Eval/Treat Not Completed: Patient at procedure or test/ unavailable. Patient transferred off unit for TEE.  [name] 03/08/2014, 2:17 PM

## 2014-03-08 NOTE — Progress Notes (Signed)
*  PRELIMINARY RESULTS* Echocardiogram Echocardiogram Transesophageal has been performed.  Traci Mclaughlin 03/08/2014, 3:45 PM

## 2014-03-08 NOTE — Progress Notes (Signed)
Subjective:  Patient feels the same today except for pain on left 5th toe which is blue and TTP. This is new per patient. Planned to do TEE and loop recorder today.  Objective: Vital signs in last 24 hours: Filed Vitals:   03/07/14 1900 03/07/14 2138 03/08/14 0140 03/08/14 0849  BP: 139/77 150/73 140/80 161/67  Pulse: 74 70 69 76  Temp: 98.4 F (36.9 C)  98.7 F (37.1 C) 98.3 F (36.8 C)  TempSrc: Oral  Oral Oral  Resp: 20  20 20   Height:      SpO2: 99%  99% 95%   Weight change:   Intake/Output Summary (Last 24 hours) at 03/08/14 1238 Last data filed at 03/08/14 0800  Gross per 24 hour  Intake      0 ml  Output    125 ml  Net   -125 ml    Vitals reviewed. General:lying in bed, calm. Is able to understand our speech better today. HEENT: PERRL, EOMI, no scleral icterus. Is not able to puff cheek. Has some right facial droop present. Cannot turn head to the right.  Cardiac: RRR, no rubs, murmurs or gallops Pulm: clear to auscultation bilaterally, no wheezes, rales, or rhonchi Abd: soft, nontender, nondistended, BS present Ext: warm and well perfused, no pedal edema Neuro: alert to self, couldn't assess full orientation. Has right facial droop.Slurred speech. Patient is aphasic mostly except for "NO" and "OK". No pronator drift. 4/5 strength right upper and lower ext. 5/5 on left extremities. Sensation intact.right sided XII nerve deficit. Facial droop on right side.   Left lower leg is cooler than the right leg. Left 5th toe appears blue and cold. It's severely TTP.  Lab Results: Basic Metabolic Panel:  Recent Labs Lab 03/05/14 1508  NA 140  K 4.1  CL 102  CO2 22  GLUCOSE 93  BUN 14  CREATININE 1.10  CALCIUM 9.2   Liver Function Tests:  Recent Labs Lab 03/05/14 1508  AST 24  ALT 15  ALKPHOS 60  BILITOT 0.5  PROT 7.4  ALBUMIN 3.8   CBC:  Recent Labs Lab 03/05/14 1332  WBC 9.8  NEUTROABS 7.5  HGB 14.9  HCT 43.6  MCV 98.4  PLT PLATELETS  APPEAR DECREASED   Cardiac Enzymes:  Recent Labs Lab 03/05/14 1725 03/05/14 2245 03/06/14 0510  TROPONINI 0.37* 0.57* 0.43*   BNP: No results found for this basename: PROBNP,  in the last 168 hours D-Dimer: No results found for this basename: DDIMER,  in the last 168 hours CBG:  Recent Labs Lab 03/05/14 1426  GLUCAP 101*   Hemoglobin A1C:  Recent Labs Lab 03/05/14 2245  HGBA1C 5.8*   Fasting Lipid Panel:  Recent Labs Lab 03/05/14 2240  CHOL 191  HDL 48  LDLCALC 122*  TRIG 106  CHOLHDL 4.0   Urine Drug Screen: Drugs of Abuse     Component Value Date/Time   LABOPIA NONE DETECTED 03/05/2014 2237   LABOPIA NEGATIVE 07/05/2011 0936   COCAINSCRNUR NONE DETECTED 03/05/2014 2237   COCAINSCRNUR NEGATIVE 07/05/2011 0936   LABBENZ NONE DETECTED 03/05/2014 2237   LABBENZ NEGATIVE 07/05/2011 0936   AMPHETMU NONE DETECTED 03/05/2014 2237   AMPHETMU NEGATIVE 07/05/2011 0936   THCU POSITIVE* 03/05/2014 2237   LABBARB NONE DETECTED 03/05/2014 2237    Alcohol Level: No results found for this basename: ETH,  in the last 168 hours Urinalysis:  Recent Labs Lab 03/05/14 [place]  LABSPEC 1.010  PHURINE 7.5  GLUCOSEU NEGATIVE  HGBUR NEGATIVE  BILIRUBINUR NEGATIVE  KETONESUR 15*  PROTEINUR NEGATIVE  UROBILINOGEN 1.0  NITRITE NEGATIVE  LEUKOCYTESUR NEGATIVE  Studies/Results: No results found. Medications: I have reviewed the patient's current medications. Scheduled Meds: .  stroke: mapping our early stages of recovery book   Does not apply Once  . aspirin  81 mg Oral Daily  . atorvastatin  40 mg Oral q1800  . clopidogrel  75 mg Oral Daily  . metoprolol tartrate  12.5 mg Oral BID   Continuous Infusions:  PRN Meds:. Assessment/Plan: Principal Problem:   Stroke Active Problems:   Elevation of cardiac enzymes   CKD (chronic kidney disease) stage 3, GFR 30-59 ml/min   Hypertension   NSTEMI (non-ST elevated myocardial infarction)   CVA (cerebral  infarction)   Right hemiplegia   Dyslipidemia   Ischemic Stroke  -acute infarct of superior left temporal lobe and portions of posterior left frontal lobe + left extreme capsule and insular cortex. There is no midline shift. There is cytotoxic edema. No acute hemorrhage.  - ASA 300 PO. F/up neuro recs. - MRA and MRI brain showed Acute large left middle cerebral artery territory infarct. Remote left posterior cerebral artery territory infarct. - doppler carotids showed no severe stenosis. Bilateral 1-39% ICA stenosis. - neuro checks - she has high risk of recurrent strokes. - PT and OT: recommended SNF/inpatient rehab. Speech recommended dysphagia 2 diet.  - Cont to work with PT and speech therapy.  - ECHO shows EF 55% with basal inferior and inferolateral hypokinesis. Cards wouldn't intervene for NSTEMI since she is in acute stroke. Will get outpatient stress test in 1 month per cardiology. - TEE planned for this afternoon to look for clots/pfo, followed by implantable loop recorder to look for Afib. - hgbA1c 5.8. Lipid panel showed LDL 122.started lipitor 40mg  daily (LDL goal <70).   Acute ischemic left 5th toe -likely 2/2 to emboli to the toe. - consult vascular surgery. Ordered ABI. Will f/up vascular recs. Will likely not intervene in the setting of acute stroke.  Elevated cardiac enzymes -0.57>0.43.  -  Elevated by trending down.  Eevation could be 2/2 to demand ischemia in the setting of stroke and HTN or NSTEMI. - ECHO shows EF 55% with basal inferior and inferolateral hypokinesis. 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 deneis chest pain. There are no signs of ongoing ischemia.  Cards wouldn't intervene for NSTEMI such as CATH since she is in acute stroke and that would be high risk. Will get outpatient stress test in 1 month per cardiology. - unable to do anything for NSTEMI in the setting of stroke.  - Will get outpatient stress test in 1 month per  cardiology. HTN  - added metoprolol 12.5mg  BID- doesn't need permissive hypertension anymore per neuro. CKD - stable now  - not sure about baseline Crt, seems to be around 1.6. Currently 1.10 better than baseline  - repeat BMP tomorrow.  Diet: dysphagia 2 (NPO until TEE done) Code: FULL  Dispo: Disposition is deferred at this time, awaiting improvement of current medical problems.  Anticipated discharge in approximately 2-3 day(s).   The patient does have a current PCP (No primary provider on file.) and does need an [facility] hospital follow-up appointment after discharge.  The patient does not know have transportation limitations that hinder transportation to clinic appointments.  .Services Needed at time of discharge: Y = Yes, Blank = No PT:   OT:   RN:  Equipment:   Other:     LOS: 3 days   Dellia Nims, MD 03/08/2014, 12:38 PM

## 2014-03-08 NOTE — Progress Notes (Signed)
Stroke Team Progress Note  HISTORY Traci Mclaughlin is a 63 y.o. female he was in her normal state of health up until yesterday when she began noticing right-sided weakness, slurred speech, difficulty speaking. Today, her daughter and asked her to come to the emergency room where was found she has a significant left MCA infarction.  LKW: 8/9  tpa given?: no, outside of window . She was admitted to the neuro floor bed for further evaluation and treatment.  SUBJECTIVE Patient complains of pain in the left foot exam the little toe appears to be discolored and and likely ischemic. She is awaiting TEE and loop recorder later today  OBJECTIVE Most recent Vital Signs: Filed Vitals:   03/07/14 1900 03/07/14 2138 03/08/14 0140 03/08/14 0849  BP: 139/77 150/73 140/80 161/67  Pulse: 74 70 69 76  Temp: 98.4 F (36.9 C)  98.7 F (37.1 C) 98.3 F (36.8 C)  TempSrc: Oral  Oral Oral  Resp: 20  20 20   Height:      SpO2: 99%  99% 95%   CBG (last 3)   Recent Labs  03/05/14 1426  GLUCAP 101*    IV Fluid Intake:     MEDICATIONS  .  stroke: mapping our early stages of recovery book   Does not apply Once  . aspirin  81 mg Oral Daily  . atorvastatin  40 mg Oral q1800  . clopidogrel  75 mg Oral Daily  . metoprolol tartrate  12.5 mg Oral BID   PRN:    Diet:  NPO   Activity:  Bedrest  DVT Prophylaxis:  SCDs  CLINICALLY SIGNIFICANT STUDIES Basic Metabolic Panel:   Recent Labs Lab 03/05/14 1508  NA 140  K 4.1  CL 102  CO2 22  GLUCOSE 93  BUN 14  CREATININE 1.10  CALCIUM 9.2   Liver Function Tests:   Recent Labs Lab 03/05/14 1508  AST 24  ALT 15  ALKPHOS 60  BILITOT 0.5  PROT 7.4  ALBUMIN 3.8   CBC:   Recent Labs Lab 03/05/14 1332  WBC 9.8  NEUTROABS 7.5  HGB 14.9  HCT 43.6  MCV 98.4  PLT PLATELETS APPEAR DECREASED   Coagulation:   Recent Labs Lab 03/05/14 1508  LABPROT 13.9  INR 1.07   Cardiac Enzymes:   Recent Labs Lab 03/05/14 1725  03/05/14 2245 03/06/14 0510  TROPONINI 0.37* 0.57* 0.43*   Urinalysis:   Recent Labs Lab 03/05/14 2237  COLORURINE YELLOW  LABSPEC 1.010  PHURINE 7.5  GLUCOSEU NEGATIVE  HGBUR NEGATIVE  BILIRUBINUR NEGATIVE  KETONESUR 15*  PROTEINUR NEGATIVE  UROBILINOGEN 1.0  NITRITE NEGATIVE  LEUKOCYTESUR NEGATIVE   Lipid Panel    Component Value Date/Time   CHOL 191 03/05/2014 2240   TRIG 106 03/05/2014 2240   HDL 48 03/05/2014 2240   CHOLHDL 4.0 03/05/2014 2240   VLDL 21 03/05/2014 2240   LDLCALC 122* 03/05/2014 2240   HgbA1C  Lab Results  Component Value Date   HGBA1C 5.8* 03/05/2014    Urine Drug Screen:     Component Value Date/Time   LABOPIA NONE DETECTED 03/05/2014 2237   LABOPIA NEGATIVE 07/05/2011 0936   COCAINSCRNUR NONE DETECTED 03/05/2014 2237   COCAINSCRNUR NEGATIVE 07/05/2011 0936   LABBENZ NONE DETECTED 03/05/2014 2237   LABBENZ NEGATIVE 07/05/2011 0936   AMPHETMU NONE DETECTED 03/05/2014 2237   AMPHETMU NEGATIVE 07/05/2011 0936   THCU POSITIVE* 03/05/2014 2237   LABBARB NONE DETECTED 03/05/2014 2237    Alcohol Level: No results found  for this basename: ETH,  in the last 168 hours  No results found.    Carotid Doppler  1-39% bilateral ICA stenosis  2D Echocardiogram  Left ventricle: The cavity size was normal. Wall thickness was increased in a pattern of mild LVH. The estimated ejection fraction was 55%. Basal inferior and inferolateral hypokinesis. Mid to apical inferolateral akinesis.   CXR  No acute abnormalities.   EKG Normal sinus rhythm Left axis deviation Right bundle branch block Minimal voltage criteria for LVH, may be normal variant Inferior infarct , age undetermined Anterolateral infarct , age undetermined Therapy Recommendations pending Physical Exam   Frail elderly African American lady currently not in distress. . Afebrile. Head is nontraumatic. Neck is supple without bruit.   Cardiac exam no murmur or gallop. Left foot little toe discolored,  painful likely ischemic.Distal pulses are well felt. Neurological Exam : Awake and alert. 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 She can follow midline and few simple commands only. Slight left gaze preference but able to look to the right past midline. Pupils equal and reactive. Fundi were not visualized. Vision acuity cannot be tested. Blinks to threat on the left but not on the right. Mild right lower facial weakness. Tongue is midline. Motor system exam will mild right lower extremity drift. Mildweakness of right grip and intrinsic hand muscles. Sensation diminished on the right side. Deep tendon reflexes are symmetric.    Gait was deferred. ASSESSMENT Ms. Traci Mclaughlin is a 63 y.o. 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 Now on aspirin 300 mg rectally every day for secondary stroke prevention. Patient with resultant aphasia and mild right hemiparesis.. Stroke work up underway.   HT-on amlodipine PTA  LDL 122-not on statins PTA  Cannabis abuse  Intracranial athersoclerosis   Hospital day # 3  TREATMENT/PLAN  Continue aspirin for secondary stroke prevention.  Continue ongoing Speech therapy consult for swallowing. Physical and occupational therapy consults.  Mobilize out of bed.  She is likely need to have inpatient rehabilitation needs  Add statin  For elevated LDL Patient has a discolored cyanotic left 5 th toe likely ischemic- primary team to evaluate and d/w Dr Dellia Nims TEE to look for embolic source. Arranged with [facility] for today at 3p  If positive for PFO (patent foramen ovale), check bilateral lower extremity venous dopplers to  rule out DVT as possible source of stroke. (I have made patient NPO after midnight tonight). If TEE negative, a [facility] electrophysiologist will consult and consider placement of an implantable loop recorder to evaluate for atrial fibrillation as etiology of stroke. This has been explained to patient/family by Dr. Leonie Man and they are agreeable.    Antony Contras, MD Medical Director [place] Pager: [phone] 03/08/2014 10:17 AM      SIGNED    To contact Stroke Continuity provider, please refer to [email or web address] After hours, contact General Neurology

## 2014-03-08 NOTE — Progress Notes (Signed)
Pt is in TEE. I will follow up tomorrow. Noted plans by vascular surgeon. I await insurance approval for an inpt rehab admission when medically ready. [phone]

## 2014-03-08 NOTE — Discharge Summary (Signed)
Name: Traci Mclaughlin MRN: [id number] DOB: 05/17/1951 63 y.o. PCP: No primary provider on file.  Date of Admission: 03/05/2014  1:31 PM Date of Discharge: 03/12/2014 Attending Physician: Carlyle Basques, MD  Discharge Diagnosis:  Principal Problem:   Stroke Active Problems:   Elevation of cardiac enzymes   CKD (chronic kidney disease) stage 3, GFR 30-59 ml/min   Hypertension   CVA (cerebral infarction)   Right hemiplegia   Dyslipidemia   Ischemic left 5th toe likely secondary to emboli   Elevated troponin  Discharge Medications:   Medication List         acetaminophen 325 MG tablet  Commonly known as:  TYLENOL  Take 2 tablets (650 mg total) by mouth every 4 (four) hours as needed for headache or mild pain.     amLODipine 5 MG tablet  Commonly known as:  NORVASC  Take 5 mg by mouth daily.     aspirin 81 MG chewable tablet  Chew 1 tablet (81 mg total) by mouth daily.     atorvastatin 40 MG tablet  Commonly known as:  LIPITOR  Take 1 tablet (40 mg total) by mouth daily at 6 PM.     clopidogrel 75 MG tablet  Commonly known as:  PLAVIX  Take 1 tablet (75 mg total) by mouth daily.     diclofenac sodium 1 % Gel  Commonly known as:  VOLTAREN  Apply 4 g topically 4 (four) times daily.     enoxaparin 40 MG/0.4ML injection  Commonly known as:  LOVENOX  Inject 0.4 mLs (40 mg total) into the skin daily.     hydrALAZINE 20 MG/ML injection  Commonly known as:  APRESOLINE  Inject 0.5 mLs (10 mg total) into the vein every 6 (six) hours as needed (SBP > 170).     HYDROcodone-acetaminophen 5-325 MG per tablet  Commonly known as:  NORCO/VICODIN  Take 1 tablet by mouth every 6 (six) hours as needed for moderate pain.     metoprolol tartrate 12.5 mg Tabs tablet  Commonly known as:  LOPRESSOR  Take 0.5 tablets (12.5 mg total) by mouth 2 (two) times daily.     ondansetron 4 MG/2ML Soln injection  Commonly known as:  ZOFRAN  Inject 2 mLs (4 mg total) into the vein every 6  (six) hours as needed for nausea.     polyethylene glycol packet  Commonly known as:  MIRALAX / GLYCOLAX  Take 17 g by mouth daily.        Disposition and follow-up:   Traci Mclaughlin was discharged from [facility] in [place] condition.  At the hospital follow up visit please address:  1.  Needs to follow up with Cardiology about 1 month for stress test and also needs to follow up at the [facility] in 4-6 weeks.  2.  Labs / imaging needed at time of follow-up:   3.  Pending labs/ test needing follow-up:   Follow-up Appointments: Follow-up Information   Follow up with Dorris Carnes, MD. Schedule an appointment as soon as possible for a visit in 1 month. (for outpatient nuclear stress test)    Specialty:  Cardiology   Contact information:   [address] [place] [address] [phone]       Follow up with SETHI,PRAMOD, MD. Schedule an appointment as soon as possible for a visit in 4 weeks.   Specialties:  Neurology, Radiology   Contact information:   [place]  [address] [place] [address] [phone]       Discharge Instructions: Discharge Instructions   Increase activity slowly    Complete by:  As directed            Consultations: Treatment Team:  Angelia Mould, MD Thompson Grayer, MD  Procedures Performed:  Dg Knee 2 Views Left  02/28/2014   CLINICAL DATA:  LEG PAIN LEG PAIN  EXAM: LEFT KNEE - 1-2 VIEW  COMPARISON:  None.  FINDINGS: There is no evidence of fracture, dislocation, or joint effusion. There is no evidence of arthropathy or other focal bone abnormality. Soft tissues are unremarkable.  IMPRESSION: Negative.   Electronically Signed   By: Arne Cleveland M.D.   On: 02/28/2014 19:50   Ct Head (brain) Wo Contrast  03/05/2014   CLINICAL DATA:  Facial droop and dysarthria ; right-sided weakness  EXAM: CT HEAD WITHOUT CONTRAST  TECHNIQUE: Contiguous axial images were obtained from the base of  the skull through the vertex without intravenous contrast.  COMPARISON:  None.  FINDINGS: The ventricles are normal in size and configuration. There is no demonstrable mass, hemorrhage, extra-axial fluid collection, or midline shift. There is decreased attenuation in the superior left temporal lobe with involvement of the left extreme capsule and insular cortex as well consistent with an acute infarct with focal cytotoxic edema in this area. This recent appearing infarct extends more superiorly to involve a portion of the posterior left frontal lobe.  Bony calvarium appears intact. The mastoid air cells are clear. There is nasal turbinate edema bilaterally. There is also decreased attenuation in a portion of the medial left occipital lobe which appears more chronic and may represent an older infarct. Elsewhere gray-white compartments appear unremarkable.  IMPRESSION: Acute infarct involving portions of the superior left temporal lobe as well as portions of the posterior left frontal lobe. There is also involvement of the left extreme capsule and insular cortex. There is cytotoxic edema in these areas.  There is decreased attenuation in the medial left occipital lobe with sparing of the post rib medial aspect of the left occipital lobe. Suspect older infarct in this area. There is no acute hemorrhage. There is no mass or midline shift. There is diffuse nasal turbinate edema bilaterally.   Electronically Signed   By: Lowella Grip M.D.   On: 03/05/2014 14:29   Mr Brain Wo Contrast  03/06/2014   CLINICAL DATA:  Right-sided weakness, speech difficulty. Acute left middle cerebral artery territory infarct.  EXAM: MRI HEAD WITHOUT CONTRAST  MRA HEAD WITHOUT CONTRAST  TECHNIQUE: Multiplanar, multiecho pulse sequences of the brain and surrounding structures were obtained without intravenous contrast. Angiographic images of the head were obtained using MRA technique without contrast.  COMPARISON:  CT of the head March 05, 2014 at 1408 hr  FINDINGS: MRI HEAD FINDINGS  Reduced diffusion within left frontal lobe with corresponding low ADC values. Mild expansile T2 hyperintense signal within the corresponding cortex. Local mass effect without midline shift. No susceptibility artifact to suggest hemorrhagic conversion.  Left mesial occipital lobe transcortical encephalomalacia with mild ex vacuo dilatation of the left occipital horn. The ventricles and sulci are otherwise normal for patient's age. A few scattered sub cm supratentorial white matter T2 hyperintensities, exclusive of the aforementioned acute does infarct suggests chronic small vessel ischemic disease.  No abnormal extra-axial fluid collections. No abnormal sellar expansion. No cerebellar tonsillar ectopia. Trace paranasal sinus mucosal thickening with small right maxillary mucous retention cyst, the mastoid air cells  appear well-aerated. Ocular globes and orbital contents are unremarkable. No suspicious calvarial bone marrow signal.  MRA HEAD FINDINGS  Anterior circulation: Normal flow related and enhancement of the included cervical, petrous, cavernous and supra clinoid internal carotid arteries. Normal flow related enhancement of the left M1 segment and left middle cerebral artery bifurcation. Meniscus and occlusion of left superior M2 branch, axial 42/126. Normal flow related enhancement of the anterior cerebral arteries and right middle cerebral artery.  Posterior circulation: Left vertebral artery is dominant. Normal flow related enhancement of vertebral basilar system, patent main branch vessels. Small right posterior communicating artery present. Thready left P2 segment with absent left P3 flow related enhancement.  No aneurysm.  IMPRESSION: MRI head: Acute large left middle cerebral artery territory infarct.  Remote left posterior cerebral artery territory infarct.  MRA head: Occluded left M2 superior branch consistent with acute thromboembolic disease.  Occluded  versus less likely slow flow left P3 segment, thready left P2 segment.   Electronically Signed   By: Elon Alas   On: 03/06/2014 01:00   US Venous Img Lower Unilateral Left  02/17/2014   CLINICAL DATA:  Left leg pain  EXAM: LEFT LOWER EXTREMITY VENOUS DOPPLER ULTRASOUND  TECHNIQUE: Gray-scale sonography with graded compression, as well as color Doppler and duplex ultrasound were performed to evaluate the lower extremity deep venous systems from the level of the common femoral vein and including the common femoral, femoral, profunda femoral, popliteal and calf veins including the posterior tibial, peroneal and gastrocnemius veins when visible. The superficial great saphenous vein was also interrogated. Spectral Doppler was utilized to evaluate flow at rest and with distal augmentation maneuvers in the common femoral, femoral and popliteal veins.  COMPARISON:  None.  FINDINGS: Common Femoral Vein: No evidence of thrombus. Normal compressibility, respiratory phasicity and response to augmentation.  Saphenofemoral Junction: No evidence of thrombus. Normal compressibility and flow on color Doppler imaging.  Profunda Femoral Vein: No evidence of thrombus. Normal compressibility and flow on color Doppler imaging.  Femoral Vein: No evidence of thrombus. Normal compressibility, respiratory phasicity and response to augmentation.  Popliteal Vein: No evidence of thrombus. Normal compressibility, respiratory phasicity and response to augmentation.  Calf Veins: No evidence of thrombus. Normal compressibility and flow on color Doppler imaging.  Superficial Great Saphenous Vein: No evidence of thrombus. Normal compressibility and flow on color Doppler imaging.  Venous Reflux:  None.  Other Findings:  None.  IMPRESSION: No evidence of deep venous thrombosis.   Electronically Signed   By: Daryll Brod M.D.   On: 02/17/2014 13:49   Dg Chest Port 1 View  03/05/2014   CLINICAL DATA:  LEFT side stroke, weakness, slurred  speech, past history MI, cervical cancer, hypertension  EXAM: PORTABLE CHEST - 1 VIEW  COMPARISON:  Portable exam 1455 hr compared to 07/03/2011  FINDINGS: Upper normal heart size.  Normal mediastinal contours and pulmonary vascularity.  Slight rotation to the LEFT.  Lungs grossly clear.  No pleural effusion, pneumothorax or acute osseous findings.  IMPRESSION: No acute abnormalities.   Electronically Signed   By: Lavonia Dana M.D.   On: 03/05/2014 15:01   Mr Jodene Nam Head/brain Wo Cm  03/06/2014   CLINICAL DATA:  Right-sided weakness, speech difficulty. Acute left middle cerebral artery territory infarct.  EXAM: MRI HEAD WITHOUT CONTRAST  MRA HEAD WITHOUT CONTRAST  TECHNIQUE: Multiplanar, multiecho pulse sequences of the brain and surrounding structures were obtained without intravenous contrast. Angiographic images of the head were obtained using MRA technique  without contrast.  COMPARISON:  CT of the head March 05, 2014 at 1408 hr  FINDINGS: MRI HEAD FINDINGS  Reduced diffusion within left frontal lobe with corresponding low ADC values. Mild expansile T2 hyperintense signal within the corresponding cortex. Local mass effect without midline shift. No susceptibility artifact to suggest hemorrhagic conversion.  Left mesial occipital lobe transcortical encephalomalacia with mild ex vacuo dilatation of the left occipital horn. The ventricles and sulci are otherwise normal for patient's age. A few scattered sub cm supratentorial white matter T2 hyperintensities, exclusive of the aforementioned acute does infarct suggests chronic small vessel ischemic disease.  No abnormal extra-axial fluid collections. No abnormal sellar expansion. No cerebellar tonsillar ectopia. Trace paranasal sinus mucosal thickening with small right maxillary mucous retention cyst, the mastoid air cells appear well-aerated. Ocular globes and orbital contents are unremarkable. No suspicious calvarial bone marrow signal.  MRA HEAD FINDINGS  Anterior  circulation: Normal flow related and enhancement of the included cervical, petrous, cavernous and supra clinoid internal carotid arteries. Normal flow related enhancement of the left M1 segment and left middle cerebral artery bifurcation. Meniscus and occlusion of left superior M2 branch, axial 42/126. Normal flow related enhancement of the anterior cerebral arteries and right middle cerebral artery.  Posterior circulation: Left vertebral artery is dominant. Normal flow related enhancement of vertebral basilar system, patent main branch vessels. Small right posterior communicating artery present. Thready left P2 segment with absent left P3 flow related enhancement.  No aneurysm.  IMPRESSION: MRI head: Acute large left middle cerebral artery territory infarct.  Remote left posterior cerebral artery territory infarct.  MRA head: Occluded left M2 superior branch consistent with acute thromboembolic disease.  Occluded versus less likely slow flow left P3 segment, thready left P2 segment.   Electronically Signed   By: Elon Alas   On: 03/06/2014 01:00    2D Echo:  TEE 03/08/14: Study Conclusions  - Left atrium: No evidence of thrombus in the atrial cavity or appendage.  TTE 03/06/14: Study Conclusions  - Left ventricle: The cavity size was normal. Wall thickness was increased in a pattern of mild LVH. The estimated ejection fraction was 55%. Basal inferior and inferolateral hypokinesis. Mid to apical inferolateral akinesis. Doppler parameters are consistent with abnormal left ventricular relaxation (grade 1 diastolic dysfunction). - Aortic valve: There was no stenosis. - Mitral valve: Mildly calcified annulus. Mildly calcified leaflets . There was no significant regurgitation. - Right ventricle: The cavity size was normal. Systolic function was normal. - Pulmonary arteries: No complete TR doppler jet so unable to estimate PA systolic pressure. - Inferior vena cava: The vessel was normal in  size. The respirophasic diameter changes were in the normal range (>= 50%), consistent with normal central venous pressure.  Impressions:  - Normal LV size with mild LV hypertrophy. Basal inferior and basal/mid/apical inferolateral wall motion abnormalities. EF 55%. Normal RV size and systolic function. No significant valvular abnormalities.   Cardiac Cath:   Admission HPI:   63 yo female with hx of MI 06/2011, HTN, cervical cancer here with right sided facial droop and slurrech speech since yesterday. She has been complaining of LLE pain for past 1 month with several ED visits. Doppler was negative. She started to act differently since yesterday and has been tearful. Her speech has become slurred and started to have drooling. She did not walk to come to the hospital but family convinced her to come today. She is heard of hearing and we had to communicate by writing on  paper. She has been tearful during the interview and cannot talk other than saying small words such as "Ok" or "no". She denies chest pain or SOB.   Hospital Course by problem list:   Ischemic Stroke - CT head showed acute infarct of superior left temporal lobe and portions of posterior left frontal lobe + left extreme capsule and insular cortex. There is no midline shift. There is cytotoxic edema. No acute hemorrhage. - MRA and MRI brain showed Acute large left middle cerebral artery territory infarct. Remote left posterior cerebral artery territory infarct.Doppler carotids showed no severe stenosis. Bilateral 1-39% ICA stenosis. TEE showed no clots.- hgbA1c 5.8. Lipid panel showed LDL 122.started lipitor 40mg  daily (LDL goal <70).  - patient is getting more alert and oriented. However, she remains mostly aphasic.  - ASA 300 PO. F/up neuro recs. - neuro checks - she has high risk of recurrent strokes. - PT and OT: CIR recommended. Speech recommended dysphagia 2 diet.  - Cont to work with PT/OT and speech therapy.  - Loop  recorder placement today before discharge per EP team Dr. Rayann Heman. - waiting for CIR placement. F/up stroke clinic outpatient 4-6 weeks. Acute ischemic left 5th toe-likely 2/2 to chronic vascular disease - consulted vascular surgery. Had arteriogram today. 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 The only option for revascularization would be a right to left fem-fem bypass graft, which has high risk given recent stroke and NSTEMI. In addition she would be at significant risk for wound healing problems and infection including the risk of graft infection given her obesity. If the foot progresses her only options would be to attempt revascularization despite the risks or she would require a left above-the-knee amputation. Elevated trops - trop down trended-0.57>0.43.  Elevation could be 2/2 to demand ischemia in the setting of stroke and HTN or NSTEMI. - ECHO shows EF 55% with basal inferior and inferolateral hypokinesis. 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 - denies chest pain. There are no signs of ongoing ischemia.Cards wouldn't intervene for NSTEMI such as CATH since she is in acute stroke and that would be high risk. Will get outpatient stress test in 1 month per cardiology. HTN - mildly elevated. - on metoprolol 12.5mg  BID and also amlodopine 5mg . Also cont hydralazine 10mg  q6hr PRN. CKD - stable now  - not sure about baseline Crt, seems to be around 1.6. Currently 1.04 better than baseline     Discharge Vitals:   BP 161/90  Pulse 94  Temp(Src) 98.3 F (36.8 C) (Oral)  Resp 18  Ht 5\' 4"  (1.626 m)  Wt 92.987 kg (205 lb)  BMI 35.17 kg/m2  SpO2 98%  Discharge Labs:  No results found for this or any previous visit (from the past 24 hour(s)).  Signed: Dellia Nims, MD 03/12/2014, 3:46 PM    Services Ordered on Discharge: CIR. Equipment Ordered on Discharge:

## 2014-03-08 NOTE — Progress Notes (Signed)
Physical Therapy Treatment Patient Details Name: Traci Mclaughlin MRN: [id number] DOB: Dec 06, 1950 Today's Date: 03/08/2014    History of Present Illness 63 yo female with hx of MI 06/2011, HTN, cervical cancer here with right sided facial droop and slurred speech since yesterday, been complaining of LLE pain for past 1 month. Doppler was negative. She started to act differently since yesterday, tearful. HOH and responded to written communication.  PMHx:  STEMI, cervical CA, acute kidney injury, HTN, HOH.    PT Comments    Pt up in chair earlier but agreed to be up again.  Has significant pain on Lateral L ankle after being on walker for 25' and nsg was informed.  Nsg stated then that despite earlier consent to see pt, she is going to receive a doppler to reck the L ankle edema.  Talked with her about concern that a ligament was at fault.  Pt painful to evert and cannot tolerate DF after standing and walking.   Follow Up Recommendations  CIR     Equipment Recommendations  Rolling walker with 5" wheels;3in1 (PT)    Recommendations for Other Services Rehab consult     Precautions / Restrictions Precautions Precautions: Fall Precaution Comments: impulsive to attempt standing, slow to respond to commands even with visual cue and repetition  Restrictions Weight Bearing Restrictions: No Other Position/Activity Restrictions: After consenting tx, nursing reported pt is getting a repeat doppler to LLE for edema and pain    Mobility  Bed Mobility Overal bed mobility: Needs Assistance Bed Mobility: Supine to Sit Rolling: Min guard Sidelying to sit: Min guard Supine to sit: Min guard     General bed mobility comments: R leg continues to be slow to move to bedside but not a source of complaint by pt  Transfers Overall transfer level: Needs assistance Equipment used: Rolling walker (2 wheeled) Transfers: Sit to/from [facility] Sit to Stand: Min assist Stand pivot  transfers: Min assist       General transfer comment: visual and tactile cues, close cues for hand placement and reminders for safety/impulsivity  Ambulation/Gait Ambulation/Gait assistance: Min assist Ambulation Distance (Feet): 25 Feet Assistive device: Rolling walker (2 wheeled) Gait Pattern/deviations: Step-through pattern;Decreased stance time - left;Decreased weight shift to left;Ataxic;Trunk flexed;Wide base of support (Struggling to control walker without assistance) Gait velocity: slower Gait velocity interpretation: Below normal speed for age/gender General Gait Details: limited tolerance for distance due to pain on LLE, pt was edematous on lateral L malleolus, could not DF well on that leg   Stairs            Wheelchair Mobility    Modified Rankin (Stroke Patients Only) Modified Rankin (Stroke Patients Only) Pre-Morbid Rankin Score: Slight disability Modified Rankin: Moderately severe disability     Balance Overall balance assessment: Needs assistance Sitting-balance support: Feet supported Sitting balance-Leahy Scale: Fair   Postural control: Other (comment) (forward lean) Standing balance support: Bilateral upper extremity supported Standing balance-Leahy Scale: Poor Standing balance comment: Pt is unsafe to stand alone as she cannot sequence with walker without help and uses visual cues to correct her safety                    Cognition Arousal/Alertness: Awake/alert Behavior During Therapy: WFL for tasks assessed/performed Overall Cognitive Status: Impaired/Different from baseline Area of Impairment: Following commands;Safety/judgement;Attention;Problem solving   Current Attention Level: Divided Memory: Decreased recall of precautions;Decreased short-term memory Following Commands: Follows one step commands inconsistently Safety/Judgement: Decreased awareness of safety  Awareness: Intellectual Problem Solving: Slow processing;Requires verbal  cues;Requires tactile cues      Exercises      General Comments General comments (skin integrity, edema, etc.): LLE edema centered around her lateral malleolus and has intolerance for eversion, spoke with nsg about an injury to ligaments to fibula      Pertinent Vitals/Pain Pain Assessment: Faces Faces Pain Scale: Hurts whole lot Pain Location: L ankle Pain Descriptors / Indicators: Other (Comment) (unable to communicate well due to CVA) Pain Intervention(s): Limited activity within patient's tolerance;Other (comment) (Identified to nsg to medicate)    Home Living                      Prior Function            PT Goals (current goals can now be found in the care plan section) Acute Rehab PT Goals Patient Stated Goal: none Progress towards PT goals: Progressing toward goals    Frequency  Min 4X/week    PT Plan Current plan remains appropriate    Co-evaluation             End of Session   Activity Tolerance: Patient limited by pain;Patient limited by fatigue Patient left: in chair;with call bell/phone within reach;with nursing/sitter in room;Other (comment)     Time: 1204-1228 PT Time Calculation (min): 24 min  Charges:  $Gait Training: 8-22 mins $Therapeutic Activity: 8-22 mins                    G Codes:      Ramond Dial 03-09-14, 1:15 PM  Mee Hives, PT MS Acute Rehab Dept. Number: [phone]

## 2014-03-08 NOTE — Progress Notes (Signed)
Plan for TEE/Loop recorder later today.  Will follow-up again tomorrow.  Pixie Casino, MD, [facility] Attending Cardiologist [place]

## 2014-03-09 ENCOUNTER — Encounter (HOSPITAL_COMMUNITY)
Admission: EM | Disposition: A | Discharge status: Rehabilitation Facility | Payer: Self-pay | Source: Home / Self Care | Attending: Internal Medicine

## 2014-03-09 ENCOUNTER — Encounter (HOSPITAL_COMMUNITY): Payer: Self-pay | Admitting: Internal Medicine

## 2014-03-09 DIAGNOSIS — I70229 Atherosclerosis of native arteries of extremities with rest pain, unspecified extremity: Secondary | ICD-10-CM

## 2014-03-09 DIAGNOSIS — I999 Unspecified disorder of circulatory system: Secondary | ICD-10-CM

## 2014-03-09 HISTORY — PX: ABDOMINAL AORTAGRAM: SHX5454

## 2014-03-09 LAB — BASIC METABOLIC PANEL
Anion gap: 15 (ref 5–15)
BUN: 16 mg/dL (ref 6–23)
CALCIUM: 9.1 mg/dL (ref 8.4–10.5)
CHLORIDE: 102 meq/L (ref 96–112)
CO2: 23 meq/L (ref 19–32)
Creatinine, Ser: 1.15 mg/dL — ABNORMAL HIGH (ref 0.50–1.10)
GFR calc Af Amer: 57 mL/min — ABNORMAL LOW (ref >90)
GFR calc non Af Amer: 50 mL/min — ABNORMAL LOW (ref >90)
Glucose, Bld: 97 mg/dL (ref 70–99)
Potassium: 3.8 mEq/L (ref 3.7–5.3)
SODIUM: 140 meq/L (ref 137–147)

## 2014-03-09 LAB — CBC WITH DIFFERENTIAL/PLATELET
BASOS PCT: 0 % (ref 0–1)
Basophils Absolute: 0 10*3/uL (ref 0.0–0.1)
Eosinophils Absolute: 0.1 10*3/uL (ref 0.0–0.7)
Eosinophils Relative: 1 % (ref 0–5)
HCT: 40.9 % (ref 36.0–46.0)
Hemoglobin: 13.9 g/dL (ref 12.0–15.0)
LYMPHS PCT: 25 % (ref 12–46)
Lymphs Abs: 2.1 10*3/uL (ref 0.7–4.0)
MCH: 33.4 pg (ref 26.0–34.0)
MCHC: 34 g/dL (ref 30.0–36.0)
MCV: 98.3 fL (ref 78.0–100.0)
Monocytes Absolute: 0.7 10*3/uL (ref 0.1–1.0)
Monocytes Relative: 8 % (ref 3–12)
Neutro Abs: 5.6 10*3/uL (ref 1.7–7.7)
Neutrophils Relative %: 66 % (ref 43–77)
PLATELETS: 218 10*3/uL (ref 150–400)
RBC: 4.16 MIL/uL (ref 3.87–5.11)
RDW: 12.5 % (ref 11.5–15.5)
WBC: 8.5 10*3/uL (ref 4.0–10.5)

## 2014-03-09 LAB — CREATININE, SERUM
Creatinine, Ser: 1.04 mg/dL (ref 0.50–1.10)
GFR calc non Af Amer: 56 mL/min — ABNORMAL LOW (ref >90)
GFR, EST AFRICAN AMERICAN: 65 mL/min — AB (ref >90)

## 2014-03-09 LAB — CBC
HEMATOCRIT: 39.5 % (ref 36.0–46.0)
Hemoglobin: 13.5 g/dL (ref 12.0–15.0)
MCH: 33.1 pg (ref 26.0–34.0)
MCHC: 34.2 g/dL (ref 30.0–36.0)
MCV: 96.8 fL (ref 78.0–100.0)
Platelets: 221 10*3/uL (ref 150–400)
RBC: 4.08 MIL/uL (ref 3.87–5.11)
RDW: 12.5 % (ref 11.5–15.5)
WBC: 10.9 10*3/uL — ABNORMAL HIGH (ref 4.0–10.5)

## 2014-03-09 LAB — GLUCOSE, CAPILLARY
Glucose-Capillary: 94 mg/dL (ref 70–99)
Glucose-Capillary: 113 mg/dL — ABNORMAL HIGH (ref 70–99)

## 2014-03-09 SURGERY — ABDOMINAL AORTAGRAM
Anesthesia: LOCAL

## 2014-03-09 MED ORDER — HEPARIN (PORCINE) IN NACL 2-0.9 UNIT/ML-% IJ SOLN
INTRAMUSCULAR | Status: AC
  Filled 2014-03-09: qty 1000

## 2014-03-09 MED ORDER — HYDRALAZINE HCL 20 MG/ML IJ SOLN
INTRAMUSCULAR | Status: AC
  Filled 2014-03-09: qty 1

## 2014-03-09 MED ORDER — ENOXAPARIN SODIUM 40 MG/0.4ML ~~LOC~~ SOLN
40.0000 mg | SUBCUTANEOUS | Status: DC
  Administered 2014-03-10 – 2014-03-12 (×3): 40 mg via SUBCUTANEOUS
  Filled 2014-03-09 (×4): qty 0.4

## 2014-03-09 MED ORDER — ONDANSETRON HCL 4 MG/2ML IJ SOLN: 4.0000 mg | Freq: Four times a day (QID) | INTRAMUSCULAR | Status: DC | PRN

## 2014-03-09 MED ORDER — SODIUM CHLORIDE 0.9 % IV SOLN: 1.0000 mL/kg/h | INTRAVENOUS | Status: AC

## 2014-03-09 MED ORDER — HYDRALAZINE HCL 20 MG/ML IJ SOLN: 10.0000 mg | Freq: Four times a day (QID) | INTRAMUSCULAR | Status: DC | PRN

## 2014-03-09 MED ORDER — LABETALOL HCL 5 MG/ML IV SOLN
INTRAVENOUS | Status: AC
  Filled 2014-03-09: qty 4

## 2014-03-09 MED ORDER — ACETAMINOPHEN 325 MG PO TABS: 650.0000 mg | ORAL_TABLET | ORAL | Status: DC | PRN

## 2014-03-09 MED ORDER — LIDOCAINE HCL (PF) 1 % IJ SOLN
INTRAMUSCULAR | Status: AC
  Filled 2014-03-09: qty 30

## 2014-03-09 MED ORDER — HYDROCODONE-ACETAMINOPHEN 5-325 MG PO TABS
1.0000 | ORAL_TABLET | Freq: Once | ORAL | Status: AC
  Administered 2014-03-09: 1 via ORAL
  Filled 2014-03-09: qty 1

## 2014-03-09 NOTE — Op Note (Signed)
   PATIENT: Traci Mclaughlin  MRN: [id number] DOB: May 06, 1951    DATE OF PROCEDURE: 03/09/2014  INDICATIONS: Traci Mclaughlin is a 63 y.o. female who has had a 2-3 month history of left leg pain. She was admitted with a stroke and a non-ST MI. She was complaining of leg pain and also noted to have some bluish discoloration of her left fifth toe. She also has some cyanosis on the lateral aspect of her foot. Arteriography was recommended in order to evaluate for peripheral vascular disease.  PROCEDURE:  1. Ultrasound-guided access to the right common femoral artery. 2. Aortogram with bilateral iliac arteriogram and bilateral lower extremity runoff.  SURGEON: Judeth Cornfield. Scot Dock, MD, FACS  ANESTHESIA: local   EBL: minimal  TECHNIQUE: The patient was taken to the peripheral vascular lab. The right groin was prepped and draped in usual sterile fashion. After the skin was anesthetized with 1% lidocaine, and under ultrasound guidance, the right common femoral artery was cannulated with a micropuncture needle and a micropuncture sheath used over a wire. This was then exchanged for a 5 French sheath over a versacore wire. The pigtail catheter was then positioned at the L1 vertebral body a flush aortogram obtained. The catheter was then positioned above the aortic bifurcation and bilateral lower extremity runoff films were obtained. A total of 98 cc of contrast were used. At the completion of the procedure the catheter was removed over a wire. The patient was transferred to short stay for removal of the sheath.  FINDINGS:  1. There are single renal arteries bilaterally with no significant renal artery stenosis identified. 2. The infrarenal aorta is widely patent. The right common iliac, external iliac, and hypogastric arteries are patent. There is an approximately 40% stenosis of the distal right external iliac artery just above the inguinal ligament. 3. On the right side, the common femoral, deep  femoral, superficial femoral, popliteal, anterior tibial, and posterior tibial arteries are patent. The peroneal artery appears to be occluded.  4. On the left side, which is the symptomatic side, there is a total occlusion of the common iliac and external iliac arteries on the left. The hypogastric artery on the left is also occluded. There is reconstitution of the superficial femoral artery and deep femoral artery on the left. The superficial femoral artery and popliteal arteries are patent. The anterior tibial artery is patent proximally but then occludes in the mid leg. The peroneal artery is patent proximally but then occludes in the mid leg. The posterior tibial artery is patent to the ankle.  CLINICAL NOTE: the patient has a chronic occlusion of the left common iliac artery. This does not appear to be acute given the extent of the collaterals. The only option for revascularization would be a right to left fem-fem bypass graft. However, this would be associated with significant risk given her recent stroke and non-ST MI. In addition she would be at significant risk for wound healing problems and infection including the risk of graft infection given her obesity. If the foot progresses her only options would be to attempt revascularization despite the risks or she would require a left above-the-knee amputation.  Traci Mayo, MD, FACS [facility]  DATE OF DICTATION:   03/09/2014

## 2014-03-09 NOTE — Progress Notes (Signed)
Noted vascular work up in process. I await clarification of plan further before planning admission to inpt rehab. I will follow up on Monday. [phone]

## 2014-03-09 NOTE — Progress Notes (Signed)
Physical Therapy Treatment Patient Details Name: Traci Mclaughlin MRN: [id number] DOB: 03/19/1951 Today's Date: 03/09/2014    History of Present Illness 63 yo female with hx of MI 06/2011, HTN, cervical cancer here with right sided facial droop and slurred speech since yesterday, been complaining of LLE pain for past 1 month. Doppler was negative. She started to act differently since yesterday, tearful. HOH and responded to written communication.  PMHx:  STEMI, cervical CA, acute kidney injury, HTN, HOH.    PT Comments    Progressing well, emphasized transfer safety and gait.  Follow Up Recommendations  CIR     Equipment Recommendations  Rolling walker with 5" wheels;3in1 (PT)    Recommendations for Other Services Rehab consult     Precautions / Restrictions Precautions Precautions: Fall Precaution Comments: some impulsivity, likely trying to second guess what they therapist wants before being told    Mobility  Bed Mobility Overal bed mobility: Needs Assistance Bed Mobility: Supine to Sit     Supine to sit: Min assist     General bed mobility comments: heavy use of the rail; R leg lagging and not as functional due to KI  Transfers Overall transfer level: Needs assistance Equipment used: Rolling walker (2 wheeled) Transfers: Sit to/from [facility] Sit to Stand: Min assist Stand pivot transfers: Min assist       General transfer comment: cues (v/t) for hand placement; stability assist  Ambulation/Gait Ambulation/Gait assistance: Min assist Ambulation Distance (Feet): 130 Feet Assistive device: Rolling walker (2 wheeled) Gait Pattern/deviations: Step-through pattern Gait velocity: slower   General Gait Details: mildly hemiparetic gait with need for minimal w/shift assist   Stairs            Wheelchair Mobility    Modified Rankin (Stroke Patients Only) Modified Rankin (Stroke Patients Only) Pre-Morbid Rankin Score: Slight  disability Modified Rankin: Moderately severe disability     Balance Overall balance assessment: Needs assistance   Sitting balance-Leahy Scale: Fair       Standing balance-Leahy Scale: Poor                      Cognition Arousal/Alertness: Awake/alert Behavior During Therapy: WFL for tasks assessed/performed Overall Cognitive Status: Impaired/Different from baseline       Memory: Decreased recall of precautions;Decreased short-term memory Following Commands: Follows one step commands with increased time Safety/Judgement: Decreased awareness of safety   Problem Solving: Slow processing;Requires verbal cues;Requires tactile cues      Exercises      General Comments        Pertinent Vitals/Pain Pain Assessment: Faces Pain Score: 6  Faces Pain Scale: Hurts even more Pain Location: L knee Pain Intervention(s): Monitored during session    Home Living                      Prior Function            PT Goals (current goals can now be found in the care plan section) Acute Rehab PT Goals Patient Stated Goal: none PT Goal Formulation: Patient unable to participate in goal setting Time For Goal Achievement: 03/20/14 Potential to Achieve Goals: Good Progress towards PT goals: Progressing toward goals    Frequency  Min 4X/week    PT Plan Current plan remains appropriate    Co-evaluation             End of Session   Activity Tolerance: Patient tolerated treatment well;Patient limited by fatigue Patient  left: in chair;with call bell/phone within reach;with nursing/sitter in room     Time: 1422-1457 PT Time Calculation (min): 35 min  Charges:  $Gait Training: 8-22 mins $Therapeutic Activity: 8-22 mins                    G Codes:      Isay Perleberg, Tessie Fass 03/09/2014, 3:09 PM 03/09/2014  Donnella Sham, PT [phone]  (pager)

## 2014-03-09 NOTE — Progress Notes (Signed)
Stroke Team Progress Note  HISTORY Traci Mclaughlin is a 63 y.o. female he was in her normal state of health up until yesterday when she began noticing right-sided weakness, slurred speech, difficulty speaking. Today, her daughter and asked her to come to the emergency room where was found she has a significant left MCA infarction.  LKW: 8/9  tpa given?: no, outside of window . She was admitted to the neuro floor bed for further evaluation and treatment.  SUBJECTIVE Patient complains of pain in the left foot exam the little toe appears to be discolored and and likely ischemic. She is just back from angiogram for peripheral vascular disease and is sedated.  OBJECTIVE Most recent Vital Signs: Filed Vitals:   03/09/14 1030 03/09/14 1045 03/09/14 1100 03/09/14 1130  BP: 165/80 132/71 152/73 169/79  Pulse: 87 71 80 90  Temp:      TempSrc:      Resp:      Height:      Weight:      SpO2:       CBG (last 3)  No results found for this basename: GLUCAP,  in the last 72 hours  IV Fluid Intake:   . sodium chloride 1 mL/kg/hr (03/09/14 0943)    MEDICATIONS  .  stroke: mapping our early stages of recovery book   Does not apply Once  . aspirin  81 mg Oral Daily  . atorvastatin  40 mg Oral q1800  . clopidogrel  75 mg Oral Daily  . [START ON 03/10/2014] enoxaparin (LOVENOX) injection  40 mg Subcutaneous Q24H  . metoprolol tartrate  12.5 mg Oral BID   PRN:    Diet:  Clear Liquid   Activity:  Bedrest  DVT Prophylaxis:  SCDs  CLINICALLY SIGNIFICANT STUDIES Basic Metabolic Panel:   Recent Labs Lab 03/05/14 1508 03/09/14 0554  NA 140 140  K 4.1 3.8  CL 102 102  CO2 22 23  GLUCOSE 93 97  BUN 14 16  CREATININE 1.10 1.15*  CALCIUM 9.2 9.1   Liver Function Tests:   Recent Labs Lab 03/05/14 1508  AST 24  ALT 15  ALKPHOS 60  BILITOT 0.5  PROT 7.4  ALBUMIN 3.8   CBC:   Recent Labs Lab 03/05/14 1332 03/09/14 0355  WBC 9.8 8.5  NEUTROABS 7.5 5.6  HGB 14.9 13.9  HCT  43.6 40.9  MCV 98.4 98.3  PLT PLATELETS APPEAR DECREASED 218   Coagulation:   Recent Labs Lab 03/05/14 1508  LABPROT 13.9  INR 1.07   Cardiac Enzymes:   Recent Labs Lab 03/05/14 1725 03/05/14 2245 03/06/14 0510  TROPONINI 0.37* 0.57* 0.43*   Urinalysis:   Recent Labs Lab 03/05/14 2237  COLORURINE YELLOW  LABSPEC 1.010  PHURINE 7.5  GLUCOSEU NEGATIVE  HGBUR NEGATIVE  BILIRUBINUR NEGATIVE  KETONESUR 15*  PROTEINUR NEGATIVE  UROBILINOGEN 1.0  NITRITE NEGATIVE  LEUKOCYTESUR NEGATIVE   Lipid Panel    Component Value Date/Time   CHOL 191 03/05/2014 2240   TRIG 106 03/05/2014 2240   HDL 48 03/05/2014 2240   CHOLHDL 4.0 03/05/2014 2240   VLDL 21 03/05/2014 2240   LDLCALC 122* 03/05/2014 2240   HgbA1C  Lab Results  Component Value Date   HGBA1C 5.8* 03/05/2014    Urine Drug Screen:     Component Value Date/Time   LABOPIA NONE DETECTED 03/05/2014 2237   LABOPIA NEGATIVE 07/05/2011 0936   COCAINSCRNUR NONE DETECTED 03/05/2014 2237   COCAINSCRNUR NEGATIVE 07/05/2011 0936   LABBENZ NONE  DETECTED 03/05/2014 2237   LABBENZ NEGATIVE 07/05/2011 0936   AMPHETMU NONE DETECTED 03/05/2014 2237   AMPHETMU NEGATIVE 07/05/2011 0936   THCU POSITIVE* 03/05/2014 2237   LABBARB NONE DETECTED 03/05/2014 2237    Alcohol Level: No results found for this basename: ETH,  in the last 168 hours  No results found.    Carotid Doppler  1-39% bilateral ICA stenosis  2D Echocardiogram  Left ventricle: The cavity size was normal. Wall thickness was increased in a pattern of mild LVH. The estimated ejection fraction was 55%. Basal inferior and inferolateral hypokinesis. Mid to apical inferolateral akinesis.   CXR  No acute abnormalities.   EKG Normal sinus rhythm Left axis deviation Right bundle branch block Minimal voltage criteria for LVH, may be normal variant Inferior infarct , age undetermined Anterolateral infarct , age undetermined Therapy Recommendations pending Physical Exam    Frail elderly African American lady currently not in distress. . Afebrile. Head is nontraumatic. Neck is supple without bruit.   Cardiac exam no murmur or gallop. Left foot little toe discolored, painful likely ischemic.Distal pulses are well felt. Neurological Exam : drowsy and sleepy.  She can follow midline and few simple commands only. Slight left gaze preference but able to look to the right past midline. Pupils equal and reactive. Fundi were not visualized. Vision acuity cannot be tested. Blinks to threat on the left but not on the right. Mild right lower facial weakness. Tongue is midline. Motor system exam will mild right lower extremity drift. Mildweakness of right grip and intrinsic hand muscles. Sensation diminished on the right side. Deep tendon reflexes are symmetric.    Gait was deferred. ASSESSMENT Ms. Traci Mclaughlin is a 63 y.o. 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 Now on aspirin 300 mg rectally every day for secondary stroke prevention. Patient with resultant aphasia and mild right hemiparesis.. Stroke work up underway.   HT-on amlodipine PTA  LDL 122-not on statins PTA  Cannabis abuse  Intracranial athersoclerosis   Hospital day # 4  TREATMENT/PLAN  Continue aspirin for secondary stroke prevention.  Continue ongoing Speech therapy consult for swallowing. Physical and occupational therapy consults.  Mobilize out of bed.  She is likely need to have inpatient rehabilitation needs  Add statin  For elevated LDL Patient has a discolored cyanotic left 5 th toe likely ischemic- primary team to evaluate and d/w Dr Dellia Nims TEE done but can`t find report in chart Loop recorder to be done on day of discharge per EP team Antony Contras, [place] Pager: [phone] 03/09/2014 11:55 AM           To contact Stroke Continuity provider, please refer to [email or web address] After hours, contact General Neurology

## 2014-03-09 NOTE — Progress Notes (Addendum)
Subjective:  Patient is sleepy today after the arteriogram by vascular surgery. Doing the same as before.   Objective: Vital signs in last 24 hours: Filed Vitals:   03/09/14 1130 03/09/14 1200 03/09/14 1300 03/09/14 1400  BP: 169/79 158/103 158/72 148/75  Pulse: 90 91 90 97  Temp:    98.5 F (36.9 C)  TempSrc:    Oral  Resp:      Height:      Weight:      SpO2:    99%   Weight change:   Intake/Output Summary (Last 24 hours) at 03/09/14 2003 Last data filed at 03/09/14 1700  Gross per 24 hour  Intake    360 ml  Output    200 ml  Net    160 ml    Vitals reviewed. General:lying in bed, calm. Sleepy today after the procedure. Full exam not done today because of this region. HEENT: PERRL, EOMI, no scleral icterus.  Cardiac: RRR, no rubs, murmurs or gallops Pulm: clear to auscultation bilaterally, no wheezes, rales, or rhonchi Abd: soft, nontender, nondistended, BS present Ext: warm and well perfused, no pedal edema Neuro: alert to self, couldn't assess full orientation. Has right facial droop.Slurred speech. Patient is aphasic mostly except for "NO" and "OK". No pronator drift. 4/5 strength right upper and lower ext. 5/5 on left extremities. Sensation intact.right sided XII nerve deficit. Facial droop on right side.   Left lower leg is cooler than the right leg. Left 5th toe appears blue and cold. It's severely TTP.  R leg has a immobilizer in place.  Lab Results: Basic Metabolic Panel:  Recent Labs Lab 03/05/14 1508 03/09/14 0554 03/09/14 1225  NA 140 140  --   K 4.1 3.8  --   CL 102 102  --   CO2 22 23  --   GLUCOSE 93 97  --   BUN 14 16  --   CREATININE 1.10 1.15* 1.04  CALCIUM 9.2 9.1  --    Liver Function Tests:  Recent Labs Lab 03/05/14 1508  AST 24  ALT 15  ALKPHOS 60  BILITOT 0.5  PROT 7.4  ALBUMIN 3.8   CBC:  Recent Labs Lab 03/05/14 1332 03/09/14 0355 03/09/14 1225  WBC 9.8 8.5 10.9*  NEUTROABS 7.5 5.6  --   HGB 14.9 13.9 13.5    HCT 43.6 40.9 39.5  MCV 98.4 98.3 96.8  PLT PLATELETS APPEAR DECREASED 218 221   Cardiac Enzymes:  Recent Labs Lab 03/05/14 1725 03/05/14 2245 03/06/14 0510  TROPONINI 0.37* 0.57* 0.43*   BNP: No results found for this basename: PROBNP,  in the last 168 hours D-Dimer: No results found for this basename: DDIMER,  in the last 168 hours CBG:  Recent Labs Lab 03/05/14 1426 03/09/14 1201 03/09/14 1635  GLUCAP 101* 94 113*   Hemoglobin A1C:  Recent Labs Lab 03/05/14 2245  HGBA1C 5.8*   Fasting Lipid Panel:  Recent Labs Lab 03/05/14 2240  CHOL 191  HDL 48  LDLCALC 122*  TRIG 106  CHOLHDL 4.0   Urine Drug Screen: Drugs of Abuse     Component Value Date/Time   LABOPIA NONE DETECTED 03/05/2014 2237   LABOPIA NEGATIVE 07/05/2011 0936   COCAINSCRNUR NONE DETECTED 03/05/2014 2237   COCAINSCRNUR NEGATIVE 07/05/2011 0936   LABBENZ NONE DETECTED 03/05/2014 2237   LABBENZ NEGATIVE 07/05/2011 0936   AMPHETMU NONE DETECTED 03/05/2014 2237   AMPHETMU NEGATIVE 07/05/2011 0936   THCU POSITIVE* 03/05/2014 2237  LABBARB NONE DETECTED 03/05/2014 2237    Alcohol Level: No results found for this basename: ETH,  in the last 168 hours Urinalysis:  Recent Labs Lab 03/05/14 2237  COLORURINE YELLOW  LABSPEC 1.010  PHURINE 7.5  GLUCOSEU NEGATIVE  HGBUR NEGATIVE  BILIRUBINUR NEGATIVE  KETONESUR 15*  PROTEINUR NEGATIVE  UROBILINOGEN 1.0  NITRITE NEGATIVE  LEUKOCYTESUR NEGATIVE  Studies/Results: No results found. Medications: I have reviewed the patient's current medications. Scheduled Meds: .  stroke: mapping our early stages of recovery book   Does not apply Once  . aspirin  81 mg Oral Daily  . atorvastatin  40 mg Oral q1800  . clopidogrel  75 mg Oral Daily  . [START ON 03/10/2014] enoxaparin (LOVENOX) injection  40 mg Subcutaneous Q24H  . metoprolol tartrate  12.5 mg Oral BID   Continuous Infusions:  PRN Meds:. Assessment/Plan: Principal Problem:   Stroke Active  Problems:   Elevation of cardiac enzymes   CKD (chronic kidney disease) stage 3, GFR 30-59 ml/min   Hypertension   NSTEMI (non-ST elevated myocardial infarction)   CVA (cerebral infarction)   Right hemiplegia   Dyslipidemia   Ischemic left 5th toe likely secondary to emboli   Ischemic Stroke  -acute infarct of superior left temporal lobe and portions of posterior left frontal lobe + left extreme capsule and insular cortex. There is no midline shift. There is cytotoxic edema. No acute hemorrhage.  - ASA 300 PO. F/up neuro recs. - MRA and MRI brain showed Acute large left middle cerebral artery territory infarct. Remote left posterior cerebral artery territory infarct. - doppler carotids showed no severe stenosis. Bilateral 1-39% ICA stenosis. - neuro checks - she has high risk of recurrent strokes. - PT and OT: recommended SNF/inpatient rehab. Speech recommended dysphagia 2 diet.  - Cont to work with PT and speech therapy.  - ECHO shows EF 55% with basal inferior and inferolateral hypokinesis. Cards wouldn't intervene for NSTEMI since she is in acute stroke. Will get outpatient stress test in 1 month per cardiology. - TEE showed no thrombus. LVEF appears mildly depressed with inferior/inferoseptal hyppokiensi/akiensis. - Loop recorder to be done on day of discharge per EP team - hgbA1c 5.8. Lipid panel showed LDL 122.started lipitor 40mg  daily (LDL goal <70).  - waiting for CIR placement. F/up stroke clinic outpatient 4-6 weeks.  Acute ischemic left 5th toe -likely 2/2 to chronic vascular disease - consulted vascular surgery. Had arteriogram today. 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 The only option for revascularization would be a right to left fem-fem bypass graft, which has high risk given recent stroke and NSTEMI. In addition she would be at significant risk for wound healing problems and infection including the risk of graft infection given her obesity.  If the foot  progresses her only options would be to attempt revascularization despite the risks or she would require a left above-the-knee amputation.  Elevated cardiac enzymes -0.57>0.43.  -  Elevated by trending down.  Eevation could be 2/2 to demand ischemia in the setting of stroke and HTN or NSTEMI. - ECHO shows EF 55% with basal inferior and inferolateral hypokinesis. 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 deneis chest pain. There are no signs of ongoing ischemia.  Cards wouldn't intervene for NSTEMI such as CATH since she is in acute stroke and that would be high risk. Will get outpatient stress test in 1 month per cardiology. - unable to do anything for NSTEMI in the setting  of stroke.  - Will get outpatient stress test in 1 month per cardiology.  HTN  - added metoprolol 12.5mg  BID- doesn't need permissive hypertension anymore per neuro.  CKD - stable now  - not sure about baseline Crt, seems to be around 1.6. Currently 1.10 better than baseline  - repeat BMP tomorrow.  Diet: dysphagia 2 (NPO until TEE done) Code: FULL  Dispo: Disposition is deferred at this time, awaiting improvement of current medical problems.  Anticipated discharge in approximately 2-3 day(s).   The patient does have a current PCP (No primary provider on file.) and does need an [facility] hospital follow-up appointment after discharge.  The patient does not know have transportation limitations that hinder transportation to clinic appointments.  .Services Needed at time of discharge: Y = Yes, Blank = No PT:   OT:   RN:   Equipment:   Other:     LOS: 4 days   Dellia Nims, MD 03/09/2014, 8:03 PM

## 2014-03-09 NOTE — Progress Notes (Signed)
  Date: 03/09/2014  Patient name: Traci Mclaughlin  Medical record number: [id number]  Date of birth: 1950-09-29   This patient has been seen and the plan of care was discussed with the house staff. Please see their note for complete details. I concur with their findings with the following additions/corrections:  Patient continues to improve with speech after having L-mca stroke. She was seen by vascular surgery consult team yesterday to evaluate right foot ischemic 5th toe. She underwent abi and arteriogram which showed chronic occlusion of the left common iliac artery. Not c/w acute given the extent of the collaterals. The only option for revascularization would be a right to left fem-fem bypass graft. However, this would be associated with significant risk given her recent stroke and non-ST MI. In addition she would be at significant risk for wound healing problems and infection including the risk of graft infection given her obesity. If the foot progresses her only options would be to attempt revascularization despite the risks or she would require a left above-the-knee amputation. Will need to continue to follow up.  She will need to be evaluated by PT to see if she still qualifies to go to CIR on Monday likely. Would recommend to do audiology eval to see if she needs hearing aids.   Carlyle Basques, MD 03/09/2014, 3:04 PM

## 2014-03-09 NOTE — Progress Notes (Signed)
Site area: rt groin Site Prior to Removal:  Level 0 Pressure Applied For: 20 Manual:   Yes, arterial sheath removed by asamuel,RN Patient Status During Pull:  stable Post Pull Site:  Level 0 Post Pull Instructions Given:  Unable to comprehend. Knee immobilzer applied to rt leg. Post Pull Pulses Present: yes Dressing Applied:  yes Bedrest begins @ [id number] Comments: no complications

## 2014-03-09 NOTE — Progress Notes (Signed)
Speech Language Pathology Treatment: Dysphagia;Cognitive-Linquistic  Patient Details Name: Traci Mclaughlin MRN: [id number] DOB: 26-Oct-1950 Today's Date: 03/09/2014 Time: [id number] SLP Time Calculation (min): 25 min  Assessment / Plan / Recommendation Clinical Impression  Pt continues to tolerate her current diet texture, with SLP providing Min multimodal cues for management of anterior loss. Pt is now expressing herself in intermittent words and phrases, although is unable to perform confrontational naming tasks. SLP attempted written and verbal (for lipreading) source for sentence completion and phonemic cues, both of which were not successful at increasing accuracy. Pt receptively identified letters from a field of 2 with ~80% accuracy with Min cueing from SLP. SLP replaced signage above HOB to reflect swallowing and communication strategies, as pt was transferred to a new room.   HPI HPI: Traci Mclaughlin is a 63 y.o. female who presented 03/05/14 with right-sided weakness, slurred speech, and difficulty speaking x1 day. MRI revealed a significant left MCA infarction.   Pertinent Vitals Pain Assessment: Faces Pain Score: 6  Faces Pain Scale: No hurt Pain Location: L knee Pain Intervention(s): Monitored during session  SLP Plan  Continue with current plan of care    Recommendations Diet recommendations: Dysphagia 2 (fine chop);Thin liquid Liquids provided via: Cup;Straw Medication Administration: Whole meds with puree Supervision: Patient able to self feed;Full supervision/cueing for compensatory strategies Compensations: Slow rate;Small sips/bites;Check for pocketing Postural Changes and/or Swallow Maneuvers: Seated upright 90 degrees              Oral Care Recommendations: Oral care BID Follow up Recommendations: Inpatient Rehab;24 hour supervision/assistance Plan: Continue with current plan of care    GO      Germain Osgood, M.A. CCC-SLP [phone]  Germain Osgood 03/09/2014, 4:50 PM

## 2014-03-09 NOTE — Progress Notes (Addendum)
   VASCULAR SURGERY ASSESSMENT & PLAN:  * ISCHEMIC LEFT LEG: The patient continues to have pain in her left foot. I am unable to palpate a left femoral pulse. She has a markedly dampened monophasic posterior tibial signal on the left. According to her daughter she has complained of pain for approximately a month or 2. 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 However, given her persistent symptoms I would recommend that we proceed with arteriography to further assess this. She was just recently admitted with a stroke and has multiple medical comorbidities (Stroke, NSTEMI, CKD 3, HTN) however I believe that this could become a limb threatening situation on the left which would further complicate her care. This reason if she is cleared by the medical service I would plan to proceed with arteriography today. Her creatinine on 03/05/2014 was 1.1.  I have ordered a follow up creatinine for this morning.  SUBJECTIVE: still with some pain left foot.  PHYSICAL EXAM: Filed Vitals:   03/08/14 1814 03/08/14 1815 03/08/14 2209 03/09/14 0201  BP: 146/72  157/91 174/90  Pulse: 90  89 89  Temp: 99.2 F (37.3 C)  98.5 F (36.9 C) 98.4 F (36.9 C)  TempSrc: Oral  Axillary Oral  Resp: 18  16 18   Height:  5\' 4"  (1.626 m)    Weight:  205 lb (92.987 kg)    SpO2: 100%  100% 100%   No change in exam. She has a palpable right femoral pulse. I am unable to palpate a left femoral pulse. She has some cyanosis on her left fifth toe and also the lateral aspect of her left foot. This could potentially be from chronic ischemia or atheroembolic disease.  LABS: Lab Results  Component Value Date   WBC 9.8 03/05/2014   HGB 14.9 03/05/2014   HCT 43.6 03/05/2014   MCV 98.4 03/05/2014   PLT PLATELETS APPEAR DECREASED 03/05/2014   Lab Results  Component Value Date   CREATININE 1.10 03/05/2014   Lab Results  Component Value Date   INR 1.07  03/05/2014   TEE: there is no evidence of thrombus in the atrial cavity or appendage. The left ventricular end-diastolic function appears mildly depressed.  2-D ECHO: ESTIMATED EJECTION FRACTION IS 55%. SHE HAS GRADE 1 DIASTOLIC DYSFUNCTION.  EKG: Shows normal sinus rhythm.   Principal Problem:   Stroke Active Problems:   Elevation of cardiac enzymes   CKD (chronic kidney disease) stage 3, GFR 30-59 ml/min   Hypertension   NSTEMI (non-ST elevated myocardial infarction)   CVA (cerebral infarction)   Right hemiplegia   Dyslipidemia   Ischemic left 5th toe likely secondary to emboli  [name] Beeper: [phone] 03/09/2014

## 2014-03-09 NOTE — Progress Notes (Signed)
Late Entry: MD on call paged at [phone] regarding pt's pain in left foot. RN having difficulty communicating with pt regarding the exact location on left leg. 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 Left foot is cold to touch and left shin is cool to touch. Pt is  MD ordered Vicodin 1 tab to be given. Pt now sleeping. Will continue to monitor pt's pain and circulation of left foot. 03/09/14 1683

## 2014-03-09 NOTE — Progress Notes (Signed)
Orthopedic Tech Progress Note Patient Details:  Traci Mclaughlin 08-18-1950 [id number]  Ortho Devices Type of Ortho Device: Knee Immobilizer Ortho Device/Splint Location: RLE Ortho Device/Splint Interventions: Ordered;Application   Braulio Bosch 03/09/2014, 9:59 AM

## 2014-03-09 NOTE — Progress Notes (Signed)
Correction of previous note on pain assessment: patient does nodd appropriately to simple questions. Nodds head yes when asked if she is hurting. Nodds head yes when asked if rt groin is hurting(manual pressure being held). Restless at times. Rt leg sheeted.

## 2014-03-10 DIAGNOSIS — I70229 Atherosclerosis of native arteries of extremities with rest pain, unspecified extremity: Secondary | ICD-10-CM

## 2014-03-10 LAB — BASIC METABOLIC PANEL
Anion gap: 16 — ABNORMAL HIGH (ref 5–15)
BUN: 14 mg/dL (ref 6–23)
CALCIUM: 9.3 mg/dL (ref 8.4–10.5)
CO2: 21 mEq/L (ref 19–32)
Chloride: 106 mEq/L (ref 96–112)
Creatinine, Ser: 1.15 mg/dL — ABNORMAL HIGH (ref 0.50–1.10)
GFR calc Af Amer: 57 mL/min — ABNORMAL LOW (ref >90)
GFR calc non Af Amer: 50 mL/min — ABNORMAL LOW (ref >90)
Glucose, Bld: 96 mg/dL (ref 70–99)
Potassium: 3.6 mEq/L — ABNORMAL LOW (ref 3.7–5.3)
SODIUM: 143 meq/L (ref 137–147)

## 2014-03-10 MED ORDER — HYDROCODONE-ACETAMINOPHEN 5-325 MG PO TABS
1.0000 | ORAL_TABLET | Freq: Four times a day (QID) | ORAL | Status: DC | PRN
  Administered 2014-03-10 – 2014-03-12 (×2): 1 via ORAL
  Filled 2014-03-10 (×2): qty 1

## 2014-03-10 MED ORDER — POTASSIUM CHLORIDE CRYS ER 20 MEQ PO TBCR
40.0000 meq | EXTENDED_RELEASE_TABLET | Freq: Once | ORAL | Status: AC
  Administered 2014-03-10: 40 meq via ORAL
  Filled 2014-03-10: qty 2

## 2014-03-10 NOTE — Progress Notes (Signed)
VASCULAR LAB PRELIMINARY  ARTERIAL  ABI completed:    RIGHT    LEFT    PRESSURE WAVEFORM  PRESSURE WAVEFORM  BRACHIAL 190 Triphasic BRACHIAL 176 Triphasic  DP 133 Biphasic DP  Absent  AT   AT 18 Severely Dampened Monophasic  PT 139 Triphasic PT 43 Dampened Monophasic  PER  Absent PER  Absent  GREAT TOE  90 GREAT TOE  0    RIGHT LEFT  ABI 0.73 0.23   ABI indicate a moderate reduction in arterial flow on the right Great toe pressure indicates adequate perfusion for vascular healing. Left ABI indicates a severe reduction in arterial flow. There is no evidence of flow to the great toe.  Lanier Felty, Walker, RVS 03/10/2014, 5:07 PM

## 2014-03-10 NOTE — Progress Notes (Signed)
Subjective:  Patient feels the same as before. Is more verbal today, able to speak in short sentences.  Denies any cp/sob/n/v. Denies any other complaint.    Objective: Vital signs in last 24 hours: Filed Vitals:   03/09/14 1300 03/09/14 1400 03/09/14 2032 03/10/14 0351  BP: 158/72 148/75 124/96 170/74  Pulse: 90 97 92 89  Temp:  98.5 F (36.9 C) 99 F (37.2 C) 99.1 F (37.3 C)  TempSrc:  Oral Oral Oral  Resp:   18 20  Height:      Weight:      SpO2:  99% 100% 97%   Weight change:   Intake/Output Summary (Last 24 hours) at 03/10/14 0915 Last data filed at 03/09/14 1700  Gross per 24 hour  Intake    360 ml  Output    200 ml  Net    160 ml    Vitals reviewed. General:lying in bed, calm. Interactive, cooperating.Marland Kitchen HEENT: PERRL, EOMI, no scleral icterus.  Cardiac: RRR, no rubs, murmurs or gallops Pulm: clear to auscultation bilaterally, no wheezes, rales, or rhonchi Abd: soft, nontender, nondistended, BS present Ext: warm and well perfused, no pedal edema Neuro: alert to self, couldn't assess full orientation. Has right facial droop.Slurred speech. Patient is able to speak in small sentences but has expressive aphasia. No pronator drift. 4/5 strength right upper and lower ext. 5/5 on left extremities. Sensation intact.right sided XII nerve deficit. Facial droop on right side.   Left lower leg is cooler than the right leg. Left 5th toe appears blue and cold. Patient withdraws from pain with palpation.  R leg has a immobilizer in place.  Lab Results: Basic Metabolic Panel:  Recent Labs Lab 03/09/14 0554 03/09/14 1225 03/10/14 0342  NA 140  --  143  K 3.8  --  3.6*  CL 102  --  106  CO2 23  --  21  GLUCOSE 97  --  96  BUN 16  --  14  CREATININE 1.15* 1.04 1.15*  CALCIUM 9.1  --  9.3   Liver Function Tests:  Recent Labs Lab 03/05/14 1508  AST 24  ALT 15  ALKPHOS 60  BILITOT 0.5  PROT 7.4  ALBUMIN 3.8   CBC:  Recent Labs Lab 03/05/14 1332  03/09/14 0355 03/09/14 1225  WBC 9.8 8.5 10.9*  NEUTROABS 7.5 5.6  --   HGB 14.9 13.9 13.5  HCT 43.6 40.9 39.5  MCV 98.4 98.3 96.8  PLT PLATELETS APPEAR DECREASED 218 221   Cardiac Enzymes:  Recent Labs Lab 03/05/14 1725 03/05/14 2245 03/06/14 0510  TROPONINI 0.37* 0.57* 0.43*   BNP: No results found for this basename: PROBNP,  in the last 168 hours D-Dimer: No results found for this basename: DDIMER,  in the last 168 hours CBG:  Recent Labs Lab 03/05/14 1426 03/09/14 1201 03/09/14 1635  GLUCAP 101* 94 113*   Hemoglobin A1C:  Recent Labs Lab 03/05/14 2245  HGBA1C 5.8*   Fasting Lipid Panel:  Recent Labs Lab 03/05/14 2240  CHOL 191  HDL 48  LDLCALC 122*  TRIG 106  CHOLHDL 4.0   Urine Drug Screen: Drugs of Abuse     Component Value Date/Time   LABOPIA NONE DETECTED 03/05/2014 2237   LABOPIA NEGATIVE 07/05/2011 0936   COCAINSCRNUR NONE DETECTED 03/05/2014 2237   COCAINSCRNUR NEGATIVE 07/05/2011 0936   LABBENZ NONE DETECTED 03/05/2014 2237   LABBENZ NEGATIVE 07/05/2011 0936   AMPHETMU NONE DETECTED 03/05/2014 2237   AMPHETMU NEGATIVE 07/05/2011  [place] 03/05/2014 2237   LABBARB NONE DETECTED 03/05/2014 2237    Alcohol Level: No results found for this basename: ETH,  in the last 168 hours Urinalysis:  Recent Labs Lab 03/05/14 2237  COLORURINE YELLOW  LABSPEC 1.010  PHURINE 7.5  GLUCOSEU NEGATIVE  HGBUR NEGATIVE  BILIRUBINUR NEGATIVE  KETONESUR 15*  PROTEINUR NEGATIVE  UROBILINOGEN 1.0  NITRITE NEGATIVE  LEUKOCYTESUR NEGATIVE  Studies/Results: No results found. Medications: I have reviewed the patient's current medications. Scheduled Meds: .  stroke: mapping our early stages of recovery book   Does not apply Once  . aspirin  81 mg Oral Daily  . atorvastatin  40 mg Oral q1800  . clopidogrel  75 mg Oral Daily  . enoxaparin (LOVENOX) injection  40 mg Subcutaneous Q24H  . metoprolol tartrate  12.5 mg Oral BID  . potassium chloride   40 mEq Oral Once   Continuous Infusions:  PRN Meds:. Assessment/Plan: Principal Problem:   Stroke Active Problems:   Elevation of cardiac enzymes   CKD (chronic kidney disease) stage 3, GFR 30-59 ml/min   Hypertension   NSTEMI (non-ST elevated myocardial infarction)   CVA (cerebral infarction)   Right hemiplegia   Dyslipidemia   Ischemic left 5th toe likely secondary to emboli   Ischemic Stroke  -acute infarct of superior left temporal lobe and portions of posterior left frontal lobe + left extreme capsule and insular cortex. There is no midline shift. There is cytotoxic edema. No acute hemorrhage.  - ASA 300 PO.  - MRA and MRI brain showed Acute large left middle cerebral artery territory infarct. Remote left posterior cerebral artery territory infarct. - doppler carotids showed no severe stenosis. Bilateral 1-39% ICA stenosis. - neuro checks - she has high risk of recurrent strokes. - PT and OT: recommended SNF/inpatient rehab. Speech recommended dysphagia 2 diet.  - Cont to work with PT and speech therapy.  - ECHO shows EF 55% with basal inferior and inferolateral hypokinesis. Cards wouldn't intervene for NSTEMI since she is in acute stroke. Will get outpatient stress test in 1 month per cardiology. - TEE showed no thrombus. LVEF appears mildly depressed with inferior/inferoseptal hyppokiensi/akiensis. - Loop recorder to be done on day of discharge per EP team - hgbA1c 5.8. Lipid panel showed LDL 122.started lipitor 40mg  daily (LDL goal <70).  - waiting for CIR placement. F/up stroke clinic outpatient 4-6 weeks.  Acute ischemic left 5th toe -likely 2/2 to chronic vascular disease - consulted vascular surgery. Had arteriogram today. 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 The only option for revascularization would be a right to left fem-fem bypass graft, which has high risk given recent stroke and NSTEMI. In addition she would be at significant risk for wound  healing problems and infection including the risk of graft infection given her obesity.  If the foot progresses her only options would be to attempt revascularization despite the risks or she would require a left above-the-knee amputation.  Elevated cardiac enzymes -0.57>0.43.  -  Elevated by trending down.  Eevation could be 2/2 to demand ischemia in the setting of stroke and HTN or NSTEMI. - ECHO shows EF 55% with basal inferior and inferolateral hypokinesis. 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 deneis chest pain. There are no signs of ongoing ischemia.  Cards wouldn't intervene for NSTEMI such as CATH since she is in acute stroke and that would be high risk. Will get outpatient stress test in 1  month per cardiology. - unable to do anything for NSTEMI in the setting of stroke.  - Will get outpatient stress test in 1 month per cardiology.  HTN  - added metoprolol 12.5mg  BID- doesn't need permissive hypertension anymore per neuro.  CKD - stable now  - not sure about baseline Crt, seems to be around 1.6. Currently 1.10 better than baseline  - repeat BMP tomorrow. Diet: dysphagia 2 (NPO until TEE done) Code: FULL  Dispo: Disposition is deferred at this time, awaiting improvement of current medical problems.  Anticipated discharge in approximately 2-3 day(s).   The patient does have a current PCP (No primary provider on file.) and does need an [facility] hospital follow-up appointment after discharge.  The patient does not know have transportation limitations that hinder transportation to clinic appointments.  .Services Needed at time of discharge: Y = Yes, Blank = No PT:   OT:   RN:   Equipment:   Other:     LOS: 5 days   Dellia Nims, MD 03/10/2014, 9:15 AM

## 2014-03-10 NOTE — Progress Notes (Signed)
   Daily Progress Note  Assessment/Planning: POD #1 s/p Ao w/ BRo   Pt not a good surgical candidate in setting of recent MI and CVA  Dr. Scot Dock had a conversation with the family yesterday afternoon about letting the patient recover before consider a fem-fem or amputation  Subjective  - 1 Day Post-Op  Pt nods to questions of L leg pain  Objective Filed Vitals:   03/09/14 1300 03/09/14 1400 03/09/14 2032 03/10/14 0351  BP: 158/72 148/75 124/96 170/74  Pulse: 90 97 92 89  Temp:  98.5 F (36.9 C) 99 F (37.2 C) 99.1 F (37.3 C)  TempSrc:  Oral Oral Oral  Resp:   18 20  Height:      Weight:      SpO2:  99% 100% 97%    Intake/Output Summary (Last 24 hours) at 03/10/14 0912 Last data filed at 03/09/14 1700  Gross per 24 hour  Intake    360 ml  Output    200 ml  Net    160 ml   VASC  L foot appears viable, no palpable pedal pulses, motor and sensation intact  Laboratory CBC    Component Value Date/Time   WBC 10.9* 03/09/2014 1225   HGB 13.5 03/09/2014 1225   HCT 39.5 03/09/2014 1225   PLT 221 03/09/2014 1225    BMET    Component Value Date/Time   NA 143 03/10/2014 0342   K 3.6* 03/10/2014 0342   CL 106 03/10/2014 0342   CO2 21 03/10/2014 0342   GLUCOSE 96 03/10/2014 0342   BUN 14 03/10/2014 0342   CREATININE 1.15* 03/10/2014 0342   CALCIUM 9.3 03/10/2014 0342   GFRNONAA 50* 03/10/2014 0342   GFRAA 57* 03/10/2014 [place], MD [facility] Office: [phone] Pager: [phone]  03/10/2014, 9:12 AM

## 2014-03-10 NOTE — Progress Notes (Signed)
Stroke Team Progress Note  HISTORY Traci Mclaughlin is a 63 y.o. female he was in her normal state of health up until yesterday when she began noticing right-sided weakness, slurred speech, difficulty speaking. Today, her daughter and asked her to come to the emergency room where was found she has a significant left MCA infarction.  LKW: 8/9  tpa given?: no, outside of window . She was admitted to the neuro floor bed for further evaluation and treatment.  SUBJECTIVE Family members present. The patient states she is hard of hearing. She is without complaints.   OBJECTIVE Most recent Vital Signs: Filed Vitals:   03/09/14 1400 03/09/14 2032 03/10/14 0351 03/10/14 1350  BP: 148/75 124/96 170/74 145/97  Pulse: 97 92 89 84  Temp: 98.5 F (36.9 C) 99 F (37.2 C) 99.1 F (37.3 C) 98.9 F (37.2 C)  TempSrc: Oral Oral Oral Oral  Resp:  18 20 18   Height:      Weight:      SpO2: 99% 100% 97% 100%   CBG (last 3)   Recent Labs  03/09/14 1201 03/09/14 1635  GLUCAP 94 113*    IV Fluid Intake:      MEDICATIONS  .  stroke: mapping our early stages of recovery book   Does not apply Once  . aspirin  81 mg Oral Daily  . atorvastatin  40 mg Oral q1800  . clopidogrel  75 mg Oral Daily  . enoxaparin (LOVENOX) injection  40 mg Subcutaneous Q24H  . metoprolol tartrate  12.5 mg Oral BID   PRN:    Diet:  Dysphagia   Activity:  Bedrest  DVT Prophylaxis:  SCDs  CLINICALLY SIGNIFICANT STUDIES Basic Metabolic Panel:   Recent Labs Lab 03/09/14 0554 03/09/14 1225 03/10/14 0342  NA 140  --  143  K 3.8  --  3.6*  CL 102  --  106  CO2 23  --  21  GLUCOSE 97  --  96  BUN 16  --  14  CREATININE 1.15* 1.04 1.15*  CALCIUM 9.1  --  9.3   Liver Function Tests:   Recent Labs Lab 03/05/14 1508  AST 24  ALT 15  ALKPHOS 60  BILITOT 0.5  PROT 7.4  ALBUMIN 3.8   CBC:   Recent Labs Lab 03/05/14 1332 03/09/14 0355 03/09/14 1225  WBC 9.8 8.5 10.9*  NEUTROABS 7.5 5.6  --   HGB  14.9 13.9 13.5  HCT 43.6 40.9 39.5  MCV 98.4 98.3 96.8  PLT PLATELETS APPEAR DECREASED 218 221   Coagulation:   Recent Labs Lab 03/05/14 1508  LABPROT 13.9  INR 1.07   Cardiac Enzymes:   Recent Labs Lab 03/05/14 1725 03/05/14 2245 03/06/14 0510  TROPONINI 0.37* 0.57* 0.43*   Urinalysis:   Recent Labs Lab 03/05/14 2237  COLORURINE YELLOW  LABSPEC 1.010  PHURINE 7.5  GLUCOSEU NEGATIVE  HGBUR NEGATIVE  BILIRUBINUR NEGATIVE  KETONESUR 15*  PROTEINUR NEGATIVE  UROBILINOGEN 1.0  NITRITE NEGATIVE  LEUKOCYTESUR NEGATIVE   Lipid Panel    Component Value Date/Time   CHOL 191 03/05/2014 2240   TRIG 106 03/05/2014 2240   HDL 48 03/05/2014 2240   CHOLHDL 4.0 03/05/2014 2240   VLDL 21 03/05/2014 2240   LDLCALC 122* 03/05/2014 2240   HgbA1C  Lab Results  Component Value Date   HGBA1C 5.8* 03/05/2014    Urine Drug Screen:     Component Value Date/Time   LABOPIA NONE DETECTED 03/05/2014 2237   LABOPIA NEGATIVE  07/05/2011 0936   COCAINSCRNUR NONE DETECTED 03/05/2014 2237   COCAINSCRNUR NEGATIVE 07/05/2011 0936   LABBENZ NONE DETECTED 03/05/2014 2237   LABBENZ NEGATIVE 07/05/2011 0936   AMPHETMU NONE DETECTED 03/05/2014 2237   AMPHETMU NEGATIVE 07/05/2011 0936   THCU POSITIVE* 03/05/2014 2237   LABBARB NONE DETECTED 03/05/2014 2237    Alcohol Level: No results found for this basename: ETH,  in the last 168 hours  No results found.    Carotid Doppler  1-39% bilateral ICA stenosis  2D Echocardiogram  Left ventricle: The cavity size was normal. Wall thickness was increased in a pattern of mild LVH. The estimated ejection fraction was 55%. Basal inferior and inferolateral hypokinesis. Mid to apical inferolateral akinesis.   CXR  No acute abnormalities.   EKG Normal sinus rhythm Left axis deviation Right bundle branch block Minimal voltage criteria for LVH, may be normal variant Inferior infarct , age undetermined Anterolateral infarct , age undetermined  Therapy  Recommendations pending  Physical Exam   Frail elderly African American lady currently not in distress. . Afebrile. Head is nontraumatic. Neck is supple without bruit.   Cardiac exam no murmur or gallop. Left foot little toe discolored, painful likely ischemic.Distal pulses are well felt. Neurological Exam : drowsy and sleepy.  She can follow midline and few simple commands only. Slight left gaze preference but able to look to the right past midline. Pupils equal and reactive. Fundi were not visualized. Vision acuity cannot be tested. Blinks to threat on the left but not on the right. Mild right lower facial weakness. Tongue is midline. HOH.  Motor system exam 4/5 on right. Mildweakness of right grip and intrinsic hand muscles. Sensation diminished on the right side. Deep tendon reflexes are symmetric.    Gait was deferred. ASSESSMENT Ms. Traci Mclaughlin is a 63 y.o. female presenting expressive aphasia and right-sided weakness secondary to left middle cerebral artery infarct due to left M2 occlusion likely from thrombo embolism likeley cardiac source. On no antiplatelets prior to admission. Now on aspirin 300 mg rectally every day for secondary stroke prevention. Patient with resultant aphasia and mild right hemiparesis.. Stroke work up underway.   HT-on amlodipine PTA  LDL 122-not on statins PTA  Cannabis abuse  Intracranial athersoclerosis   Hospital day # 5  TREATMENT/PLAN  Continue aspirin for secondary stroke prevention.  Continue ongoing Speech therapy consult for swallowing. Physical and occupational therapy consults.  Mobilize out of bed.  She is likely need to have inpatient rehabilitation needs  Lipitor added for elevated LDL. Patient has a discolored cyanotic left 5 th toe likely ischemic- primary team to evaluate and d/w Dr Dellia Nims TEE -  03/08/2014 - Left atrium: No evidence of thrombus in the atrial cavity orappendage. Loop recorder to be done on day of  discharge per EP team   Mikey Bussing PA-C [facility] Pager [phone] 03/10/2014, 2:54 PM   This patient is ill and at significant risk of neurological worsening, constant monitoring of vital signs, hemodynamics,respiratory and cardiac monitoring,review of multiple databases, neurological assessment, discussion with family, other specialists and medical decision making of high complexity.I have made any additions or clarifications directly to the above note.  Leotis Pain           To contact Stroke Continuity provider, please refer to [email or web address] After hours, contact General Neurology

## 2014-03-10 NOTE — Progress Notes (Signed)
CONSULT NOTE  Date: 03/10/2014               Patient Name:  Traci Mclaughlin MRN: [id number]  DOB: [birth year] Age / Sex: 63 y.o., female        PCP: No primary provider on file. Primary Cardiologist: Irish Lack            Referring Physician: Baxter Flattery              Reason for Consult: Elevated Troponin           History of Present Illness: Patient is a 63 y.o. female with a PMHx of elevated cardiac enzymes in the past with normal cardiac catheterization, acute kidney injury, who was admitted to [facility] on 03/05/2014 for evaluation of  stroke.   She was noted to have an elevated point-of-care troponin level and we were called to consult.  In 2012, she was admitted with 3 days of CP  And elevated troponin levels up to 6.9.  She had complained of severe N / V.   She had a cardiac catheterization by Dr. Irish Lack which revealed smooth and normal coronary arteries.  She has made little progress with her neuro status. Her troponin levels are very minimal and I do not think these are consistent with a non-ST segment elevation myocardial infarction.  She denies any chest pain.  Medications: Outpatient medications: Prescriptions prior to admission  Medication Sig Dispense Refill  . amLODipine (NORVASC) 5 MG tablet Take 5 mg by mouth daily.      . diclofenac sodium (VOLTAREN) 1 % GEL Apply 4 g topically 4 (four) times daily.      Marland Kitchen HYDROcodone-acetaminophen (NORCO/VICODIN) 5-325 MG per tablet Take 1 tablet by mouth every 6 (six) hours as needed for moderate pain.        Current medications: Current Facility-Administered Medications  Medication Dose Route Frequency Provider Last Rate Last Dose  .  stroke: mapping our early stages of recovery book   Does not apply Once Wilber Oliphant, MD      . acetaminophen (TYLENOL) tablet 650 mg  650 mg Oral Q4H PRN Angelia Mould, MD      . aspirin chewable tablet 81 mg  81 mg Oral Daily Pixie Casino, MD   81 mg at 03/10/14 1045  .  atorvastatin (LIPITOR) tablet 40 mg  40 mg Oral q1800 Pixie Casino, MD   40 mg at 03/09/14 1715  . clopidogrel (PLAVIX) tablet 75 mg  75 mg Oral Daily Pixie Casino, MD   75 mg at 03/10/14 1045  . enoxaparin (LOVENOX) injection 40 mg  40 mg Subcutaneous Q24H Angelia Mould, MD   40 mg at 03/10/14 0809  . hydrALAZINE (APRESOLINE) injection 10 mg  10 mg Intravenous Q6H PRN Angelia Mould, MD      . metoprolol tartrate (LOPRESSOR) tablet 12.5 mg  12.5 mg Oral BID Pixie Casino, MD   12.5 mg at 03/10/14 0902  . ondansetron (ZOFRAN) injection 4 mg  4 mg Intravenous Q6H PRN Angelia Mould, MD         No Known Allergies   Past Medical History  Diagnosis Date  . Cancer     cervical; was on chemo; has been in remission for 101yrs  . STEMI (ST elevation myocardial infarction) 06/2011  . Acute kidney injury 07/06/2011  . Hypertension   . HOH (hard of hearing)     Past Surgical History  Procedure  Laterality Date  . Cardiac catheterization  07/03/2011    Dr. Irish Lack  . Tee without cardioversion N/A 03/08/2014    Procedure: TRANSESOPHAGEAL ECHOCARDIOGRAM (TEE);  Surgeon: Fay Records, MD;  Location: [facility] ENDOSCOPY;  Service: Cardiovascular;  Laterality: N/A;    Family History  Problem Relation Age of Onset  . Intracerebral hemorrhage Daughter     during childbirth    Social History:  reports that she has quit smoking. Her smoking use included Cigarettes. She has a 1 pack-year smoking history. She quit smokeless tobacco use about 2 years ago. She reports that she drinks alcohol. She reports that she does not use illicit drugs.   Review of Systems: She was unable to provide any review of systems because of her severe stroke.  Physical Exam: BP 170/74  Pulse 89  Temp(Src) 99.1 F (37.3 C) (Oral)  Resp 20  Ht 5\' 4"  (1.626 m)  Wt 205 lb (92.987 kg)  BMI 35.17 kg/m2  SpO2 97%  Wt Readings from Last 3 Encounters:  03/08/14 205 lb (92.987 kg)  03/08/14 205 lb  (92.987 kg)  03/08/14 205 lb (92.987 kg)    General: Vital signs reviewed and noted. Well-developed, well-nourished, in no acute distress; alert,   Head: Normocephalic, atraumatic, sclera anicteric,   Neck: Supple. Negative for carotid bruits. No JVD   Lungs:  Clear bilaterally, no  wheezes, rales, or rhonchi. Breathing is normal   Heart: RR  Abdomen:  Soft, non-tender, non-distended with normoactive bowel sounds. No hepatomegaly. No rebound/guarding. No obvious abdominal masses   MSK: Strength and the appear normal for age.   Extremities: No clubbing or cyanosis. No edema.  Distal pedal pulses are 2+ and equal   Neurologic: Alert.  Was able to nod and shake her head.   Psych: na    Lab results: Basic Metabolic Panel:  Recent Labs Lab 03/05/14 1508 03/09/14 0554 03/09/14 1225 03/10/14 0342  NA 140 140  --  143  K 4.1 3.8  --  3.6*  CL 102 102  --  106  CO2 22 23  --  21  GLUCOSE 93 97  --  96  BUN 14 16  --  14  CREATININE 1.10 1.15* 1.04 1.15*  CALCIUM 9.2 9.1  --  9.3    Liver Function Tests:  Recent Labs Lab 03/05/14 1508  AST 24  ALT 15  ALKPHOS 60  BILITOT 0.5  PROT 7.4  ALBUMIN 3.8   No results found for this basename: LIPASE, AMYLASE,  in the last 168 hours No results found for this basename: AMMONIA,  in the last 168 hours  CBC:  Recent Labs Lab 03/05/14 1332 03/09/14 0355 03/09/14 1225  WBC 9.8 8.5 10.9*  NEUTROABS 7.5 5.6  --   HGB 14.9 13.9 13.5  HCT 43.6 40.9 39.5  MCV 98.4 98.3 96.8  PLT PLATELETS APPEAR DECREASED 218 221    Cardiac Enzymes:  Recent Labs Lab 03/05/14 1725 03/05/14 2245 03/06/14 0510  TROPONINI 0.37* 0.57* 0.43*    BNP: No components found with this basename: POCBNP,   CBG:  Recent Labs Lab 03/05/14 1426 03/09/14 1201 03/09/14 1635  GLUCAP 101* 94 113*    Coagulation Studies: No results found for this basename: LABPROT, INR,  in the last 72 hours   Other results: EKG :  Normal sinus rhythm.   Right bundle branch block  Imaging: No results found.   Last Echo 07/03/11 Left ventricle: Hyperdynamic LV function creating mild LV outflow tract  gradient. The cavity size was normal. Wall thickness was increased in a pattern of moderate LVH. Systolic function was normal. The estimated ejection fraction was 80%. There was dynamic obstruction, with a peak velocity of 303cm/sec and a peak gradient of 79mm Hg. Wall motion was normal; there were no regional wall motion abnormalities. - Mitral valve: Calcified annulus. Mildly thickened leaflets . Mild regurgitation. - Pericardium, extracardiac: A trivial pericardial effusion was identified      Last Cath 07/03/11 IMPRESSIONS:  1. Normal left main coronary artery. 2. Normal left anterior descending artery and its branches. 3. Normal left circumflex artery and its branches. 4. Normal right coronary artery. 5. LVEDP 8 mmHg. Ejection fraction was not assessed due to the patient's increased creatinine.       Assessment & Plan: 1. Elevated troponin level: Had normal cardiac catheterization by Dr. Irish Lack in 2012.   Her Troponin levels are very minimal.  Her echo shows overall normal LV function with EF 55% but with mid - apical inferiolateral akinesis.   I would continue medical therapy for now.  She denies any pain. She has had a   transesophageal echo and has been seen by Dr. Rayann Heman. He may consider putting in a implantable loop recorder  Thayer Headings, Brooke Bonito., MD, [facility] 03/10/2014, 12:34 PM Office - [phone] Pager [phone]

## 2014-03-10 NOTE — Plan of Care (Signed)
Problem: Discharge/Transitional Outcomes Goal: Hemodynamically stable Outcome: Progressing Patients Vital signs are stable - BP runs a little high but is controlled with BP meds Avie Echevaria 03/10/2014 11:31 PM

## 2014-03-11 DIAGNOSIS — R778 Other specified abnormalities of plasma proteins: Secondary | ICD-10-CM | POA: Diagnosis present

## 2014-03-11 DIAGNOSIS — R7989 Other specified abnormal findings of blood chemistry: Secondary | ICD-10-CM | POA: Diagnosis present

## 2014-03-11 MED ORDER — POLYETHYLENE GLYCOL 3350 17 G PO PACK
17.0000 g | PACK | Freq: Every day | ORAL | Status: DC
  Administered 2014-03-11: 17 g via ORAL
  Filled 2014-03-11 (×2): qty 1

## 2014-03-11 MED ORDER — AMLODIPINE BESYLATE 5 MG PO TABS
5.0000 mg | ORAL_TABLET | Freq: Every day | ORAL | Status: DC
  Administered 2014-03-11 – 2014-03-12 (×2): 5 mg via ORAL
  Filled 2014-03-11 (×2): qty 1

## 2014-03-11 NOTE — Progress Notes (Signed)
Internal Medicine On-Call Attending  Date: 03/11/2014  Patient name: [name] Medical record number: [id number] Date of birth: 04-11-1951 Age: 63 y.o. Gender: female  I saw and evaluated the patient. I discussed patient and reviewed the resident's note by Dr. Hayes Ludwig, and I agree with the resident's findings and plans as documented in her note.  Dr. Lynnae January will take over as attending physician tomorrow 03/12/2014.

## 2014-03-11 NOTE — Progress Notes (Signed)
Subjective: No overnight events. She has no complaints. Denies chest pain, SOB, N/V.   Objective: Vital signs in last 24 hours: Filed Vitals:   03/09/14 2032 03/10/14 0351 03/10/14 1350 03/10/14 2037  BP: 124/96 170/74 145/97 156/85  Pulse: 92 89 84 79  Temp: 99 F (37.2 C) 99.1 F (37.3 C) 98.9 F (37.2 C) 99.5 F (37.5 C)  TempSrc: Oral Oral Oral Oral  Resp: 18 20 18 18   Height:      Weight:      SpO2: 100% 97% 100% 99%   Weight change:   Intake/Output Summary (Last 24 hours) at 03/11/14 0945 Last data filed at 03/11/14 0800  Gross per 24 hour  Intake    120 ml  Output      0 ml  Net    120 ml   Vitals reviewed.  General: Resting in bed, in NAD, slow speech but fluent, improving HEENT: no scleral icterus, MMM Cardiac: RRR, no rubs, murmurs or gallops  Pulm: clear to auscultation bilaterally, no wheezes, rales, or rhonchi  Abd: soft, nontender, nondistended, BS present  Ext: warm and well perfused, no pedal edema, left lower leg is cooler than the right leg. Left 5th toe appears blue and cold. Patient withdraws from pain with palpation. R leg has a immobilizer in place. Neuro: alert to self, unable to assess for full orientation. Has right facial droop. Slurred speech, slow but improving. Patient is able to speak in small sentences but has expressive aphasia. No pronator drift. 4/5 strength right upper and lower ext. 5/5 on left extremities. Sensation intact. Right sided XII nerve deficit.  Lab Results: Basic Metabolic Panel:  Recent Labs Lab 03/09/14 0554 03/09/14 1225 03/10/14 0342  NA 140  --  143  K 3.8  --  3.6*  CL 102  --  106  CO2 23  --  21  GLUCOSE 97  --  96  BUN 16  --  14  CREATININE 1.15* 1.04 1.15*  CALCIUM 9.1  --  9.3   Liver Function Tests:  Recent Labs Lab 03/05/14 1508  AST 24  ALT 15  ALKPHOS 60  BILITOT 0.5  PROT 7.4  ALBUMIN 3.8   CBC:  Recent Labs Lab 03/05/14 1332 03/09/14 0355 03/09/14 1225  WBC 9.8 8.5 10.9*    NEUTROABS 7.5 5.6  --   HGB 14.9 13.9 13.5  HCT 43.6 40.9 39.5  MCV 98.4 98.3 96.8  PLT PLATELETS APPEAR DECREASED 218 221   Cardiac Enzymes:  Recent Labs Lab 03/05/14 1725 03/05/14 2245 03/06/14 0510  TROPONINI 0.37* 0.57* 0.43*  CBG:  Recent Labs Lab 03/05/14 1426 03/09/14 1201 03/09/14 1635  GLUCAP 101* 94 113*   Hemoglobin A1C:  Recent Labs Lab 03/05/14 2245  HGBA1C 5.8*   Fasting Lipid Panel:  Recent Labs Lab 03/05/14 2240  CHOL 191  HDL 48  LDLCALC 122*  TRIG 106  CHOLHDL 4.0   Coagulation:  Recent Labs Lab 03/05/14 1508  LABPROT 13.9  INR 1.07   Urine Drug Screen: Drugs of Abuse     Component Value Date/Time   LABOPIA NONE DETECTED 03/05/2014 2237   LABOPIA NEGATIVE 07/05/2011 0936   COCAINSCRNUR NONE DETECTED 03/05/2014 2237   COCAINSCRNUR NEGATIVE 07/05/2011 0936   LABBENZ NONE DETECTED 03/05/2014 2237   LABBENZ NEGATIVE 07/05/2011 0936   AMPHETMU NONE DETECTED 03/05/2014 2237   AMPHETMU NEGATIVE 07/05/2011 0936   THCU POSITIVE* 03/05/2014 2237   LABBARB NONE DETECTED 03/05/2014 2237  Urinalysis:  Recent Labs Lab 03/05/14 2237  COLORURINE YELLOW  LABSPEC 1.010  PHURINE 7.5  GLUCOSEU NEGATIVE  HGBUR NEGATIVE  BILIRUBINUR NEGATIVE  KETONESUR 15*  PROTEINUR NEGATIVE  UROBILINOGEN 1.0  NITRITE NEGATIVE  LEUKOCYTESUR NEGATIVE   Medications: I have reviewed the patient's current medications. Scheduled Meds: .  stroke: mapping our early stages of recovery book   Does not apply Once  . aspirin  81 mg Oral Daily  . atorvastatin  40 mg Oral q1800  . clopidogrel  75 mg Oral Daily  . enoxaparin (LOVENOX) injection  40 mg Subcutaneous Q24H  . metoprolol tartrate  12.5 mg Oral BID   Continuous Infusions:  PRN Meds:.acetaminophen, hydrALAZINE, HYDROcodone-acetaminophen, ondansetron (ZOFRAN) IV Assessment/Plan: 63 yr old woman with PMH of HTN, HLP, presenting with acute CVA, outside window for tPA.   Ischemic Stroke: MRA and MRI brain  showed acute large left middle cerebral artery territory infarct. Remote left posterior cerebral artery territory infarct. Left frontal lobe + left extreme capsule and insular cortex. There is no midline shift. There is cytotoxic edema. No acute hemorrhage. 2D CHO shows EF 55% with basal inferior and inferolateral hypokinesis. TEE showed no evidence of thrombus.  - TEE showed no thrombus. LVEF appears mildly depressed with inferior/inferoseptal hyppokiensi/akiensis. -Continue ASA 81mg  daily - Doppler carotids showed no severe stenosis. Bilateral 1-39% ICA stenosis.  - hgbA1c 5.8. Lipid panel showed LDL 122.started lipitor 40mg  daily (LDL goal <70).  Plan:  - Neuro checks - she has high risk of recurrent strokes. - Continue dysphagia 2 diet with thin liquid with cup/straw - Cont to work with PT and speech therapy - waiting for CIR placement. F/up stroke clinic outpatient 4-6 weeks  Acute ischemic left 5th toe - Likely 2/2 to chronic vascular disease. She is s/p arteriogram per Vascular Surgery (Dr. 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 The only option for revascularization would be a right to left fem-fem bypass graft, which has high risk given recent stroke and NSTEMI. In addition she would be at significant risk for wound healing problems and infection including the risk of graft infection given her obesity. If the foot progresses her only options would be to attempt revascularization despite the risks or she would require a left above-the-knee amputation. -Vascular Surgery following, appreciate help and recommendations  Elevated troponin -  On presentation Troponin only mildly elevate to 0.57>0.43. Cardiology recommended no intervention as elevated troponin could be 2/2 to ischemia in setting of acute CVA. In addition, they do not recommend intervention for possible NSTEMI such as CATH since she is in acute stroke and that would be high risk for complications. She had CP in  2012 with troponin elevated to 6.9, cardiac cath by Dr. Michel Harrow at that time revealed smooth and normal coronary arteries. 2D echo with EF of 55% with basal inferior and inferolateral hypokinesis. - Will get outpatient stress test in 1 month per Cardiology. - She may need Loop recorder on the day of her discharge by Dr. Rayann Heman (EP Cards)  HTN- On Norvasc 5mg  daily at home but not sure if compliant. BP elevated on presentation and today.  -Continue newly started metoprolol 12.5mg  BID -Add home Norvasc 5mg  daily  CKD3 -  Baseline Cr of ~1.1-1.2. Cr stable at 1.15.  -Continue monitoring  FEN Diet: dysphagia 2 with thin liquids, cup and straw  Code: FULL  Dispo: Disposition is deferred at this time, awaiting improvement of current medical problems. Anticipated discharge in approximately 1-2  day(s). Awaiting transfer to CIR v SNF placement for ongoing SLP and PT/OT therapies.   The patient does have a current PCP (No primary provider on file.) and does need an [facility] hospital follow-up appointment after discharge.   The patient does not know have transportation limitations that hinder transportation to clinic appointments.  .Services Needed at time of discharge: Y = Yes, Blank = No PT:   OT:   RN:   Equipment:   Other:     LOS: 6 days   Blain Pais, MD 03/11/2014, 9:45 AM

## 2014-03-11 NOTE — Clinical Social Work Psychosocial (Signed)
Clinical Social Work Department BRIEF PSYCHOSOCIAL ASSESSMENT 03/11/2014  Patient:  Traci Mclaughlin     Account Number:  [id number]     Admit date:  03/05/2014  Clinical Social Worker:  Wylene Men  Date/Time:  03/11/2014 12:39 PM  Referred by:  Physician  Date Referred:  03/11/2014 Referred for  SNF Placement   Other Referral:   none   Interview type:  Other - See comment Other interview type:   Pt is only oriented to person at this time.  CSW contacted daughter, Traci Mclaughlin [phone].    PSYCHOSOCIAL DATA Living Status:  FAMILY Admitted from facility:   Level of care:   Primary support name:  Traci Mclaughlin Primary support relationship to patient:  CHILD, ADULT Degree of support available:   adequate    CURRENT CONCERNS Current Concerns  Post-Acute Placement   Other Concerns:   none    SOCIAL WORK ASSESSMENT / PLAN Pt is only oriented to self. CSW contacted pt daughter, Traci Mclaughlin.   Traci Mclaughlin recently underwent a total knee replacement (Wednesday 03/07/2014) at [facility] and was just discharged home to self care/home health PT so pt daughter has not been able to be at bedside, but is now available by phone.    Daughter reports difficulty dealing with patient's sudden health decline.  CSW provided support.  Daughter reports that pt was independent, alert, oriented and appropriate prior to admision to the [facility].  Patient is from home with [place].  Traci Mclaughlin reports being sole caregiver for patient since 55.    PT is recommending CIR for pt once medically discharged. CIR has not yet assessed patient for apprpriateness.  CSW introduced SNF as a backup option to CIR.  Pt daughter is agreeable to this.  Pt daughter is agreeable to SNF search in the [facility] area.  Daughter unsure if pt will be able to withstand the aggressive therapy provided by CIR, but states she will remain optimistic and hopeful for CIR admission.  CSW will continue to follow for SNF as a  backup.   Assessment/plan status:  Psychosocial Support/Ongoing Assessment of Needs Other assessment/ plan:   FL2  PASARR   Information/referral to community resources:   SNF  CIR    PATIENT'S/FAMILY'S RESPONSE TO PLAN OF CARE: Pt daughter, Traci Mclaughlin was agreeable to SNF as a backup option to CIR.  CSW will continue to follow and assist.       Nonnie Done, [place] [phone] (weekend coverage)  Clinical Social Work

## 2014-03-11 NOTE — Progress Notes (Signed)
Pts clinical course is noted.  EP to implant implantable loop recorder on day of discharge/ transfer to rehab.

## 2014-03-12 ENCOUNTER — Inpatient Hospital Stay (HOSPITAL_COMMUNITY)
Admission: RE | Admit: 2014-03-12 | Discharge: 2014-03-23 | DRG: 945 | Disposition: A | Discharge status: Home Health | Payer: Medicare Other | Source: Intra-hospital | Attending: Physical Medicine & Rehabilitation | Admitting: Physical Medicine & Rehabilitation

## 2014-03-12 ENCOUNTER — Encounter (HOSPITAL_COMMUNITY)
Admission: EM | Disposition: A | Discharge status: Rehabilitation Facility | Payer: Self-pay | Source: Home / Self Care | Attending: Internal Medicine

## 2014-03-12 DIAGNOSIS — G8190 Hemiplegia, unspecified affecting unspecified side: Secondary | ICD-10-CM

## 2014-03-12 DIAGNOSIS — I6330 Cerebral infarction due to thrombosis of unspecified cerebral artery: Secondary | ICD-10-CM

## 2014-03-12 DIAGNOSIS — R29810 Facial weakness: Secondary | ICD-10-CM | POA: Diagnosis not present

## 2014-03-12 DIAGNOSIS — H9190 Unspecified hearing loss, unspecified ear: Secondary | ICD-10-CM

## 2014-03-12 DIAGNOSIS — I6340 Cerebral infarction due to embolism of unspecified cerebral artery: Secondary | ICD-10-CM

## 2014-03-12 DIAGNOSIS — I252 Old myocardial infarction: Secondary | ICD-10-CM

## 2014-03-12 DIAGNOSIS — I999 Unspecified disorder of circulatory system: Secondary | ICD-10-CM

## 2014-03-12 DIAGNOSIS — E876 Hypokalemia: Secondary | ICD-10-CM | POA: Diagnosis not present

## 2014-03-12 DIAGNOSIS — R4701 Aphasia: Secondary | ICD-10-CM

## 2014-03-12 DIAGNOSIS — I6350 Cerebral infarction due to unspecified occlusion or stenosis of unspecified cerebral artery: Secondary | ICD-10-CM

## 2014-03-12 DIAGNOSIS — Z87891 Personal history of nicotine dependence: Secondary | ICD-10-CM

## 2014-03-12 DIAGNOSIS — E785 Hyperlipidemia, unspecified: Secondary | ICD-10-CM | POA: Diagnosis present

## 2014-03-12 DIAGNOSIS — I10 Essential (primary) hypertension: Secondary | ICD-10-CM | POA: Diagnosis present

## 2014-03-12 DIAGNOSIS — I639 Cerebral infarction, unspecified: Secondary | ICD-10-CM | POA: Diagnosis present

## 2014-03-12 DIAGNOSIS — Z8541 Personal history of malignant neoplasm of cervix uteri: Secondary | ICD-10-CM | POA: Diagnosis not present

## 2014-03-12 DIAGNOSIS — Z5189 Encounter for other specified aftercare: Secondary | ICD-10-CM

## 2014-03-12 DIAGNOSIS — M79609 Pain in unspecified limb: Secondary | ICD-10-CM

## 2014-03-12 DIAGNOSIS — N179 Acute kidney failure, unspecified: Secondary | ICD-10-CM

## 2014-03-12 DIAGNOSIS — H1130 Conjunctival hemorrhage, unspecified eye: Secondary | ICD-10-CM

## 2014-03-12 DIAGNOSIS — I745 Embolism and thrombosis of iliac artery: Secondary | ICD-10-CM | POA: Diagnosis not present

## 2014-03-12 DIAGNOSIS — I998 Other disorder of circulatory system: Secondary | ICD-10-CM | POA: Diagnosis present

## 2014-03-12 DIAGNOSIS — Z823 Family history of stroke: Secondary | ICD-10-CM | POA: Diagnosis not present

## 2014-03-12 HISTORY — PX: LOOP RECORDER IMPLANT: SHX5954

## 2014-03-12 HISTORY — PX: LOOP RECORDER IMPLANT: SHX5477

## 2014-03-12 LAB — URINALYSIS, ROUTINE W REFLEX MICROSCOPIC
GLUCOSE, UA: NEGATIVE mg/dL
Hgb urine dipstick: NEGATIVE
KETONES UR: NEGATIVE mg/dL
Nitrite: NEGATIVE
PH: 5 (ref 5.0–8.0)
Protein, ur: NEGATIVE mg/dL
Specific Gravity, Urine: 1.025 (ref 1.005–1.030)
Urobilinogen, UA: 1 mg/dL (ref 0.0–1.0)

## 2014-03-12 LAB — URINE MICROSCOPIC-ADD ON

## 2014-03-12 SURGERY — LOOP RECORDER IMPLANT
Anesthesia: LOCAL

## 2014-03-12 MED ORDER — METHOCARBAMOL 500 MG PO TABS
500.0000 mg | ORAL_TABLET | Freq: Four times a day (QID) | ORAL | Status: DC | PRN
  Administered 2014-03-20 – 2014-03-23 (×3): 500 mg via ORAL
  Filled 2014-03-12 (×3): qty 1

## 2014-03-12 MED ORDER — BISACODYL 10 MG RE SUPP
10.0000 mg | Freq: Every day | RECTAL | Status: DC | PRN
  Administered 2014-03-21: 10 mg via RECTAL
  Filled 2014-03-12: qty 1

## 2014-03-12 MED ORDER — PROCHLORPERAZINE EDISYLATE 5 MG/ML IJ SOLN
5.0000 mg | Freq: Four times a day (QID) | INTRAMUSCULAR | Status: DC | PRN
  Filled 2014-03-12: qty 2

## 2014-03-12 MED ORDER — CLOPIDOGREL BISULFATE 75 MG PO TABS: 75.0000 mg | ORAL_TABLET | Freq: Every day | ORAL | Status: DC

## 2014-03-12 MED ORDER — LIDOCAINE-EPINEPHRINE 1 %-1:100000 IJ SOLN
INTRAMUSCULAR | Status: AC
  Filled 2014-03-12: qty 1

## 2014-03-12 MED ORDER — ENOXAPARIN SODIUM 40 MG/0.4ML ~~LOC~~ SOLN: 40.0000 mg | SUBCUTANEOUS | Status: DC

## 2014-03-12 MED ORDER — TRAZODONE HCL 50 MG PO TABS: 25.0000 mg | ORAL_TABLET | Freq: Every evening | ORAL | Status: DC | PRN

## 2014-03-12 MED ORDER — HYDROCODONE-ACETAMINOPHEN 5-325 MG PO TABS: 1.0000 | ORAL_TABLET | Freq: Four times a day (QID) | ORAL | Status: DC | PRN

## 2014-03-12 MED ORDER — ONDANSETRON HCL 4 MG/2ML IJ SOLN: 4.0000 mg | Freq: Four times a day (QID) | INTRAMUSCULAR | Status: DC | PRN

## 2014-03-12 MED ORDER — METOPROLOL TARTRATE 12.5 MG HALF TABLET
12.5000 mg | ORAL_TABLET | Freq: Two times a day (BID) | ORAL | Status: DC
  Administered 2014-03-12 – 2014-03-23 (×22): 12.5 mg via ORAL
  Filled 2014-03-12 (×24): qty 1

## 2014-03-12 MED ORDER — HYDRALAZINE HCL 20 MG/ML IJ SOLN: 10.0000 mg | Freq: Four times a day (QID) | INTRAMUSCULAR | Status: DC | PRN

## 2014-03-12 MED ORDER — POLYETHYLENE GLYCOL 3350 17 G PO PACK: 17.0000 g | PACK | Freq: Every day | ORAL | Status: DC

## 2014-03-12 MED ORDER — POLYETHYLENE GLYCOL 3350 17 G PO PACK
17.0000 g | PACK | ORAL | Status: DC
  Administered 2014-03-14 – 2014-03-20 (×3): 17 g via ORAL
  Filled 2014-03-12 (×5): qty 1

## 2014-03-12 MED ORDER — CLOPIDOGREL BISULFATE 75 MG PO TABS
75.0000 mg | ORAL_TABLET | Freq: Every day | ORAL | Status: DC
  Administered 2014-03-13 – 2014-03-23 (×11): 75 mg via ORAL
  Filled 2014-03-12 (×12): qty 1

## 2014-03-12 MED ORDER — PROCHLORPERAZINE 25 MG RE SUPP
12.5000 mg | Freq: Four times a day (QID) | RECTAL | Status: DC | PRN
Start: 2014-03-12 — End: 2014-03-23
  Filled 2014-03-12: qty 1

## 2014-03-12 MED ORDER — METOPROLOL TARTRATE 12.5 MG HALF TABLET: 12.5000 mg | ORAL_TABLET | Freq: Two times a day (BID) | ORAL | Status: DC

## 2014-03-12 MED ORDER — PROCHLORPERAZINE MALEATE 5 MG PO TABS
5.0000 mg | ORAL_TABLET | Freq: Four times a day (QID) | ORAL | Status: DC | PRN
  Filled 2014-03-12: qty 2

## 2014-03-12 MED ORDER — ATORVASTATIN CALCIUM 40 MG PO TABS
40.0000 mg | ORAL_TABLET | Freq: Every day | ORAL | Status: DC
  Administered 2014-03-12 – 2014-03-22 (×11): 40 mg via ORAL
  Filled 2014-03-12 (×12): qty 1

## 2014-03-12 MED ORDER — FLEET ENEMA 7-19 GM/118ML RE ENEM: 1.0000 | ENEMA | Freq: Once | RECTAL | Status: AC | PRN

## 2014-03-12 MED ORDER — DIPHENHYDRAMINE HCL 12.5 MG/5ML PO ELIX: 12.5000 mg | ORAL_SOLUTION | Freq: Four times a day (QID) | ORAL | Status: DC | PRN

## 2014-03-12 MED ORDER — ACETAMINOPHEN 325 MG PO TABS
650.0000 mg | ORAL_TABLET | ORAL | Status: DC | PRN
  Administered 2014-03-17 – 2014-03-18 (×2): 650 mg via ORAL
  Filled 2014-03-12 (×3): qty 2

## 2014-03-12 MED ORDER — ACETAMINOPHEN 325 MG PO TABS: 650.0000 mg | ORAL_TABLET | ORAL | Status: DC | PRN

## 2014-03-12 MED ORDER — HYDROCODONE-ACETAMINOPHEN 5-325 MG PO TABS
1.0000 | ORAL_TABLET | Freq: Two times a day (BID) | ORAL | Status: DC
  Administered 2014-03-12 – 2014-03-14 (×4): 1 via ORAL
  Filled 2014-03-12 (×4): qty 1

## 2014-03-12 MED ORDER — ASPIRIN 81 MG PO CHEW
81.0000 mg | CHEWABLE_TABLET | Freq: Every day | ORAL | Status: DC
  Administered 2014-03-13 – 2014-03-23 (×11): 81 mg via ORAL
  Filled 2014-03-12 (×13): qty 1

## 2014-03-12 MED ORDER — ENOXAPARIN SODIUM 40 MG/0.4ML ~~LOC~~ SOLN
40.0000 mg | SUBCUTANEOUS | Status: DC
  Administered 2014-03-13 – 2014-03-21 (×9): 40 mg via SUBCUTANEOUS
  Filled 2014-03-12 (×12): qty 0.4

## 2014-03-12 MED ORDER — GUAIFENESIN-DM 100-10 MG/5ML PO SYRP: 5.0000 mL | ORAL_SOLUTION | Freq: Four times a day (QID) | ORAL | Status: DC | PRN

## 2014-03-12 MED ORDER — ATORVASTATIN CALCIUM 40 MG PO TABS: 40.0000 mg | ORAL_TABLET | Freq: Every day | ORAL | Status: DC

## 2014-03-12 MED ORDER — ASPIRIN 81 MG PO CHEW: 81.0000 mg | CHEWABLE_TABLET | Freq: Every day | ORAL | Status: DC

## 2014-03-12 MED ORDER — AMLODIPINE BESYLATE 5 MG PO TABS
5.0000 mg | ORAL_TABLET | Freq: Every day | ORAL | Status: DC
  Administered 2014-03-13 – 2014-03-23 (×11): 5 mg via ORAL
  Filled 2014-03-12 (×12): qty 1

## 2014-03-12 MED ORDER — ALUM & MAG HYDROXIDE-SIMETH 200-200-20 MG/5ML PO SUSP: 30.0000 mL | ORAL | Status: DC | PRN

## 2014-03-12 NOTE — Progress Notes (Signed)
  Date: 03/12/2014  Patient name: Traci Mclaughlin  Medical record number: [id number]  Date of birth: [birth year]   This patient has been seen and the plan of care was discussed with the house staff. Please see Dr Quintella Baton note for complete details. I concur with their findings.  Bartholomew Crews, MD 03/12/2014, 12:50 PM

## 2014-03-12 NOTE — Progress Notes (Signed)
   SUBJECTIVE: The patient is doing well today.  At this time, she denies chest pain, shortness of breath, or any new concerns.  .  stroke: mapping our early stages of recovery book   Does not apply Once  . amLODipine  5 mg Oral Daily  . aspirin  81 mg Oral Daily  . atorvastatin  40 mg Oral q1800  . clopidogrel  75 mg Oral Daily  . enoxaparin (LOVENOX) injection  40 mg Subcutaneous Q24H  . metoprolol tartrate  12.5 mg Oral BID  . polyethylene glycol  17 g Oral Daily      OBJECTIVE: Physical Exam: Filed Vitals:   03/10/14 2037 03/11/14 1345 03/11/14 2024 03/12/14 0411  BP: 156/85 144/80 175/94 148/82  Pulse: 79 80 89 88  Temp: 99.5 F (37.5 C) 99 F (37.2 C) 98.9 F (37.2 C) 98.9 F (37.2 C)  TempSrc: Oral Oral Oral Oral  Resp: 18 17 18 18   Height:      Weight:      SpO2: 99% 98% 97% 97%    Intake/Output Summary (Last 24 hours) at 03/12/14 8101 Last data filed at 03/11/14 1300  Gross per 24 hour  Intake    360 ml  Output      0 ml  Net    360 ml    Telemetry reveals sinus rhythm, no afib  GEN- The patient is chronically ill appearing, alert and oriented x 3 today.   Head- normocephalic, atraumatic Eyes-  Sclera clear, conjunctiva pink Ears- poor hearing Oropharynx- clear Neck- supple  Lungs- Clear to ausculation bilaterally, normal work of breathing Heart- Regular rate and rhythm  GI- soft, NT, ND, + BS Extremities- no clubbing, cyanosis, or edema    LABS: Basic Metabolic Panel:  Recent Labs  03/09/14 1225 03/10/14 0342  NA  --  143  K  --  3.6*  CL  --  106  CO2  --  21  GLUCOSE  --  96  BUN  --  14  CREATININE 1.04 1.15*  CALCIUM  --  9.3   Liver Function Tests: No results found for this basename: AST, ALT, ALKPHOS, BILITOT, PROT, ALBUMIN,  in the last 72 hours No results found for this basename: LIPASE, AMYLASE,  in the last 72 hours CBC:  Recent Labs  03/09/14 1225  WBC 10.9*  HGB 13.5  HCT 39.5  MCV 96.8  PLT 221      ASSESSMENT AND PLAN:  Principal Problem:   Stroke Active Problems:   Elevation of cardiac enzymes   CKD (chronic kidney disease) stage 3, GFR 30-59 ml/min   Hypertension   CVA (cerebral infarction)   Right hemiplegia   Dyslipidemia   Ischemic left 5th toe likely secondary to emboli   Elevated troponin  Assessment and Plan:  1. Cryptogenic stroke The patient presented with cryptogenic stroke.  TEE is reviewed and revealed no thrombus.  Risks, benefits, and alteratives to implantable loop recorder were discussed with the patient today.   At this time, the patient is very clear in their decision to proceed with implantable loop recorder. I will await word for plans of discharge.  I would like to implant device on same day as discharge.  Keep on telemetry while here.  Please call with questions.   Thompson Grayer, MD 03/12/2014 7:52 AM

## 2014-03-12 NOTE — Progress Notes (Signed)
Patient ID: Traci Mclaughlin, female   DOB: January 18, 1951, 63 y.o.   MRN: [id number] Patient admitted to 4M07 via bed, escorted by nursing staff.  Unable to go over rehab process with patient due to aphasia, limited communication.  VSS.  Patient is very tearful and crys when I asked her about her birthday, but calmed down after some reassurance.  Appears to be in no immediate distress at this time.  Will continue to monitor.  Brita Romp, RN

## 2014-03-12 NOTE — H&P (Signed)
Physical Medicine and Rehabilitation Admission H&P  Chief Complaint   Patient presents with   .  Right sided weakness and difficulty speaking.   HPI: Traci Mclaughlin is a 63 y.o. female with history of HTN, Hearing impaired, cervical cancer; who was admitted on 03/05/14 with right sided weakness, right facial droop and difficulty speaking X 1 day. Family was able to convince her to come to treat treatment and CT head revealed acute infarct superior left temporal lobe, portions of the posterior left frontal lobe, left extreme capsule and insular cortex with cytotoxic edema. 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 2D echo with EF 55% with basal inferior and inferior lateral hypokinesis and mid to apical inferolateral akinesis. UDS positive for THC. Patient with elevated cardiac enzymes and cardiology felt changes due to [facility] stroke. Carotid dopplers without significant ICA stenosis. 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 Dr. Rayann Heman consulted for input and TEE done revealing normal aorta, no thrombus or PFO and mildly depressed LVEF. Loop recorder placed on 03/12/14  Dr. Scot Dock consulted for input on chronic foot pain as well as ischemic left 5th toe as well as some cyanosis along lateral aspect of her foot. Patient with markedly diminshed flow left foot as well as difficulty palpating left femoral vein. Arteriogram done today showing total occlusion of left common iliac artery with collaterals and did not appear acute. If foot progresses options include Right Fem-Fem BG but with significant risk of wound healing and infection or L-AKA.  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 CIR recommended  by rehab team and patient admitted today.  Review of Systems  Unable to perform ROS: language  HENT: Positive for hearing loss.  Neurological: Positive for sensory change and speech change.  Psychiatric/Behavioral: The patient is nervous/anxious.   Past Medical History   Diagnosis  Date   .  Cancer      cervical; was on chemo; has been in remission for 26yrs   .  STEMI (ST elevation myocardial infarction)  06/2011   .  Acute kidney injury  07/06/2011   .  Hypertension    .  HOH (hard of hearing)     Past Surgical History   Procedure  Laterality  Date   .  Cardiac catheterization   07/03/2011     Dr. Irish Lack   .  Tee without cardioversion  N/A  03/08/2014     Procedure: TRANSESOPHAGEAL ECHOCARDIOGRAM (TEE); Surgeon: Fay Records, MD; Location: [facility] ENDOSCOPY; Service: Cardiovascular; Laterality: N/A;    Family History   Problem  Relation  Age of Onset   .  Intracerebral hemorrhage  Daughter      during childbirth    Social History: Lives with family. Multiple extended family members at home can provide supervision. Per reports that she has quit smoking. Her smoking use included Cigarettes. She has a 1 pack-year smoking history. She quit smokeless tobacco use about 2 years ago. Per reports that she drinks wine occasionally. Per reports that she does not use illicit drugs.    Allergies: No Known Allergies  Medications Prior to Admission   Medication  Sig  Dispense  Refill   .  amLODipine (NORVASC) 5 MG tablet  Take 5 mg by mouth daily.     .  diclofenac sodium (  VOLTAREN) 1 % GEL  Apply 4 g topically 4 (four) times daily.     Marland Kitchen  HYDROcodone-acetaminophen (NORCO/VICODIN) 5-325 MG per tablet  Take 1 tablet by mouth every 6 (six) hours as needed for moderate pain.      Home:  Home Living  Family/patient expects to be discharged to:: Unsure  Living Arrangements: Children  Available Help at Discharge: Family;Available 24 hours/day;Other (Comment) (daughter states pt. can live with her)    Type of Home: House  Home Access: Stairs to enter  [employer] of Steps: 3-4  Entrance Stairs-Rails: None  Home Layout: Two level;Able to live on main level with bedroom/bathroom  Additional Comments: Family present and communicated with therapist. Patient unable fully understand questions or respond accurently  Lives With: Daughter  Functional History:  Prior Function  Level of Independence: Independent  Comments: Patient's daughter states patient was perfoming all tasks independently  Functional Status:  Mobility:  Bed Mobility  Overal bed mobility: Needs Assistance  Bed Mobility: Supine to Sit  Rolling: Min guard  Sidelying to sit: Min guard  Supine to sit: Min assist  Sit to supine: Min assist  General bed mobility comments: heavy use of the rail; R leg lagging and not as functional due to KI  Transfers  Overall transfer level: Needs assistance  Equipment used: Rolling walker (2 wheeled)  Transfers: Sit to/from [employer] to Stand: Min assist  Stand pivot transfers: Min assist  General transfer comment: cues (v/t) for hand placement; stability assist  Ambulation/Gait  Ambulation/Gait assistance: Min assist  Ambulation Distance (Feet): 130 Feet  Assistive device: Rolling walker (2 wheeled)  Gait Pattern/deviations: Step-through pattern  Gait velocity: slower  Gait velocity interpretation: Below normal speed for age/gender  General Gait Details: mildly hemiparetic gait with need for minimal w/shift assist   ADL:  ADL  Overall ADL's : Needs assistance/impaired  Eating/Feeding: Minimal assistance (requires min assist with bringing food>mouth)  Eating/Feeding Details (indicate cue type and reason): tactile cueing  Grooming: Min guard  Grooming Details (indicate cue type and reason): Min guard for dynamic standing and hand over hand for actual grooming task due to poor proprioception  Upper Body Bathing: Minimal assitance  Lower Body  Bathing: Minimal assistance  Upper Body Dressing : Minimal assistance  Lower Body Dressing: Moderate assistance  Toilet Transfer: Minimal assistance  Toileting- Clothing Manipulation and Hygiene: Minimal assistance  Tub/ Shower Transfer: Minimal assistance  Functional mobility during ADLs: Minimal assistance  General ADL Comments: Patient functioning at an overall min assist for functional tasks. Patient requires multimodal cues to complete tasks. Due to aphasia, patient does require tactile and visual cueing. Patient with decreased dynamic standing balance/tolerance/endurance and tends to fatigue easily. Patient with noted decreased proprioception and sensation > RUE, unable to fully assess due to aphasia/cognition.  Cognition:  Cognition  Overall Cognitive Status: Impaired/Different from baseline  Arousal/Alertness: Awake/alert  Orientation Level: Oriented to person;Oriented to place  Attention: Sustained  Sustained Attention: Appears intact  Awareness: (emergent awareness of linguistic errors appears intact)  Behaviors: Poor frustration tolerance  Cognition  Arousal/Alertness: Awake/alert  Behavior During Therapy: WFL for tasks assessed/performed  Overall Cognitive Status: Impaired/Different from baseline  Area of Impairment: Following commands;Safety/judgement;Attention;Problem solving  Current Attention Level: Divided  Memory: Decreased recall of precautions;Decreased short-term memory  Following Commands: Follows one step commands with increased time  Safety/Judgement: Decreased awareness of safety  Awareness: Intellectual  Problem Solving: Slow processing;Requires verbal cues;Requires tactile cues  Difficult to assess due  to: Hard of hearing/deaf;Impaired communication  Physical Exam:  Blood pressure 161/90, pulse 94, temperature 98.3 F (36.8 C), temperature source Oral, resp. rate 18, height 5\' 4"  (1.626 m), weight 92.987 kg (205 lb), SpO2 98.00%.  Physical Exam  Nursing  note and vitals reviewed.  Constitutional: She appears well-developed and well-nourished. anxious HENT: dentition poor, oral mucosa moist Head: Normocephalic and atraumatic.  Eyes: Conjunctivae are normal. Pupils are equal, round, and reactive to light. Tends to close left eye to read--question visual deficits.  Neck: Normal range of motion. Neck supple.  Cardiovascular: Regular rhythm. Tachycardia present.  Respiratory: Effort normal and breath sounds normal. No respiratory distress.  GI: Soft. Bowel sounds are normal. She exhibits no distension.  Musculoskeletal: She exhibits no edema. Left leg and foot painful with palpation or manipulation RIght knee discomfort with ROM.  Neurological: She is alert.  Anxious appearing. Unable to answer simple biographic Y/N question. Dysarthric speech. Fluent aphasia with improving content. Needs visual cues to follow simple commands. Some apraxia. Right sided weakness noted.  Her speech is slurred. She is slowed. She is inattentive.  4-/5 R delt, bi, tri, grip  4/5 R HF, KE, ADF/APF  5/5 on Left side except for left foot which is limited due to pain Skin: ischemia noted at left fourth and fifth toes---areas are quite painful to touch. Psych: anxious, frequently tearful   No results found for this or any previous visit (from the past 29 hour(s)).  No results found.  Medical Problem List and Plan:  1. Functional deficits secondary to L-MCA infarct with right hemiparesis, aphasia as well as ischemia left foot.  2. DVT Prophylaxis/Anticoagulation: Pharmaceutical: Lovenox  3. Pain Management: Indicating severe pain left foot. Will continue hydrocodone schedule bid as well as prn. Patient unable to ask for medication.  4. Mood: Labile and anxious in part due to pain as well as communication deficits. Will have LCSW follow for evaluation and support.  5. Neuropsych: This patient is not quite yet capable of making decisions on her own behalf.  6. Skin/Wound  Care: Pressure relief measures.  7. Chronic left iliac artery occlusion: Will order PRAFO for prevention of breakdown. Continue prn medications for pain control.   -pain will be an issue with weight bearing/activities with therapy.  8. Hypokalemia: Will check follow up labs in am.   Post Admission Physician Evaluation:  1. Functional deficits secondary to left MCA infarct with right hemiparesis, ischemic left foot. 2. Patient is admitted to receive collaborative, interdisciplinary care between the physiatrist, rehab nursing staff, and therapy team. 3. Patient's level of medical complexity and substantial therapy needs in context of that medical necessity cannot be provided at a lesser intensity of care such as a SNF. 4. Patient has experienced substantial functional loss from his/her baseline which was documented above under the "Functional History" and "Functional Status" headings. Judging by the patient's diagnosis, physical exam, and functional history, the patient has potential for functional progress which will result in measurable gains while on inpatient rehab. These gains will be of substantial and practical use upon discharge in facilitating mobility and self-care at the household level. 5. Physiatrist will provide 24 hour management of medical needs as well as oversight of the therapy plan/treatment and provide guidance as appropriate regarding the interaction of the two. 6. 24 hour rehab nursing will assist with bladder management, bowel management, safety, skin/wound care, disease management, medication administration, pain management and patient education and help integrate therapy concepts, techniques,education, etc. 7. PT will  assess and treat for/with: Lower extremity strength, range of motion, stamina, balance, functional mobility, safety, adaptive techniques and equipment, NMR, cognitive perceptual awareness, pain mgt, egosupport. Goals are: supervision to mod I. 8. OT will assess and  treat for/with: ADL's, functional mobility, safety, upper extremity strength, adaptive techniques and equipment, NMR, egosupport, community reintegration, pain mgt.  Goals are: supervision to mod I. 9. SLP will assess and treat for/with: cognition, communication, language. Goals are: mod I to min assist 10. Case Management and Social Worker will assess and treat for psychological issues and discharge planning. 11. Team conference will be held weekly to assess progress toward goals and to determine barriers to discharge. 12. Patient will receive at least 3 hours of therapy per day at least 5 days per week. 13. ELOS: 7-8 days  14. Prognosis: excellent  Meredith Staggers, MD, [name]   03/12/2014

## 2014-03-12 NOTE — Progress Notes (Signed)
   VASCULAR SURGERY ASSESSMENT & PLAN:  * CHRONIC LEFT ILIAC ARTERY OCCLUSION: The patient has chronic ischemia of the left lower extremity. Her best option for revascularization would be a right to left fem-fem bypass graft. However, given her recent stroke, and recent MI, she would be at very high risk for surgery. In addition, given her obesity, she would be at increased risk for wound healing problems and graft infection. Therefore, currently I would be very reluctant to consider revascularization unless the situation and her left foot worsened significantly. When she is discharged and follow this as an outpatient.  If the foot worsens significantly, the least risky option would be a primary amputation.  SUBJECTIVE: no specific complaints  PHYSICAL EXAM: Filed Vitals:   03/11/14 1345 03/11/14 2024 03/12/14 0411 03/12/14 1116  BP: 144/80 175/94 148/82 161/90  Pulse: 80 89 88 94  Temp: 99 F (37.2 C) 98.9 F (37.2 C) 98.9 F (37.2 C) 98.3 F (36.8 C)  TempSrc: Oral Oral Oral Oral  Resp: 17 18 18    Height:      Weight:      SpO2: 98% 97% 97% 98%   No change exam. Some darkish discoloration of the left fifth toe and lateral aspect of the left foot.  LABS: Lab Results  Component Value Date   WBC 10.9* 03/09/2014   HGB 13.5 03/09/2014   HCT 39.5 03/09/2014   MCV 96.8 03/09/2014   PLT 221 03/09/2014   Lab Results  Component Value Date   CREATININE 1.15* 03/10/2014   Lab Results  Component Value Date   INR 1.07 03/05/2014   CBG (last 3)   Recent Labs  03/09/14 1635  GLUCAP 113*   Principal Problem:   Stroke Active Problems:   Elevation of cardiac enzymes   CKD (chronic kidney disease) stage 3, GFR 30-59 ml/min   Hypertension   CVA (cerebral infarction)   Right hemiplegia   Dyslipidemia   Ischemic left 5th toe likely secondary to emboli   Elevated troponin  [name] Beeper: [phone] 03/12/2014

## 2014-03-12 NOTE — Op Note (Signed)
SURGEON:  Thompson Grayer, MD     PREPROCEDURE DIAGNOSIS:  Cryptogenic Stroke    POSTPROCEDURE DIAGNOSIS:  Cryptogenic Stroke     PROCEDURES:   1. Implantable loop recorder implantation    INTRODUCTION:  Traci Mclaughlin is a 63 y.o. female with a history of unexplained stroke who presents today for implantable loop implantation.  The patient has had a cryptogenic stroke.  Despite an extensive workup by neurology, no reversible causes have been identified.  she has worn telemetry during which she did not have arrhythmias.  There is significant concern for possible atrial fibrillation as the cause for the patients stroke.  The patient therefore presents today for implantable loop implantation.     DESCRIPTION OF PROCEDURE:  Informed written consent was obtained, and the patient was brought to the electrophysiology lab in a fasting state.  The patient required no sedation for the procedure today.  Mapping over the patient's chest was performed by the EP lab staff to identify the area where electrograms were most prominent for ILR recording.  This area was found to be the left parasternal region over the 3rd-4th intercostal space. The patients left chest was therefore prepped and draped in the usual sterile fashion by the EP lab staff. The skin overlying the left parasternal region was infiltrated with lidocaine for local analgesia.  A 0.5-cm incision was made over the left parasternal region over the 3rd intercostal space.  A subcutaneous ILR pocket was fashioned using a combination of sharp and blunt dissection.  A Medtronic Reveal [place] model [id number] implantable loop recorder was then placed into the pocket  R waves were very prominent and measured 0.61mV.  Steri- Strips and a sterile dressing were then applied.  There were no early apparent complications.     CONCLUSIONS:   1. Successful implantation of a Medtronic Reveal LINQ implantable loop recorder for cryptogenic stroke  2. No early  apparent complications.    OK to discharge from an EP standpoint My office to arrange follow-up  Electrophysiology team to see as needed while here. Please call with questions.

## 2014-03-12 NOTE — Progress Notes (Signed)
Physical Therapy Treatment Patient Details Name: Keena Dinse MRN: [id number] DOB: 1950-11-09 Today's Date: 03/12/2014    History of Present Illness 63 yo female with hx of MI 06/2011, HTN, cervical cancer here with right sided facial droop and slurred speech since yesterday, been complaining of LLE pain for past 1 month. Doppler was negative. She started to act differently since yesterday, tearful. HOH and responded to written communication.  PMHx:  STEMI, cervical CA, acute kidney injury, HTN, HOH.    PT Comments    Pt progressing well, seems frustrated by progress though. Followed simple commands 60% of time, ambulated 120' with RW and min A. PT will continue to follow.   Follow Up Recommendations  CIR     Equipment Recommendations  Rolling walker with 5" wheels;3in1 (PT)    Recommendations for Other Services Rehab consult     Precautions / Restrictions Precautions Precautions: Fall Precaution Comments: impulsivity with mobility, partly cognitive, partly lack of physical control of motion  Restrictions Weight Bearing Restrictions: No    Mobility  Bed Mobility Overal bed mobility: Needs Assistance Bed Mobility: Supine to Sit     Supine to sit: Supervision     General bed mobility comments: uncoordinated, uses momentum to get up, supervision edge of bed as pt with fwd lean but no LOB and no physical assist needed  Transfers Overall transfer level: Needs assistance Equipment used: Rolling walker (2 wheeled) Transfers: Sit to/from Stand Sit to Stand: Min assist         General transfer comment: visual cues given, min A to steady, needs more work with controlling sitting  Ambulation/Gait Ambulation/Gait assistance: Museum/gallery curator (Feet): 120 Feet Assistive device: Rolling walker (2 wheeled) Gait Pattern/deviations: Step-through pattern;Decreased weight shift to right;Ataxic;Decreased step length - left Gait velocity: decreased   General  Gait Details: right knee not buckling today but pt fatigues quickly with groaning with effort periodically throughout. visual cues for staying within RW especially with turning   Stairs            Wheelchair Mobility    Modified Rankin (Stroke Patients Only) Modified Rankin (Stroke Patients Only) Pre-Morbid Rankin Score: Slight disability Modified Rankin: Moderately severe disability     Balance Overall balance assessment: Needs assistance Sitting-balance support: No upper extremity supported;Feet supported Sitting balance-Leahy Scale: Fair     Standing balance support: Bilateral upper extremity supported;During functional activity Standing balance-Leahy Scale: Poor                      Cognition Arousal/Alertness: Awake/alert Behavior During Therapy: WFL for tasks assessed/performed Overall Cognitive Status: Impaired/Different from baseline Area of Impairment: Following commands;Safety/judgement;Attention;Problem solving   Current Attention Level: Divided   Following Commands: Follows one step commands inconsistently;Follows one step commands with increased time Safety/Judgement: Decreased awareness of safety Awareness: Emergent Problem Solving: Slow processing;Requires verbal cues;Requires tactile cues General Comments: improvement in cognition and pt verbalizing. Answered questions appropriately 60% of time    Exercises Total Joint Exercises Bridges: AAROM;5 reps;Supine General Exercises - Lower Extremity Ankle Circles/Pumps: AROM;Both;10 reps;Supine Heel Slides: AROM;10 reps;Supine;Both Straight Leg Raises: AROM;Both;10 reps;Supine    General Comments        Pertinent Vitals/Pain Pain Assessment: No/denies pain (but reports numbness LE's)    Home Living                      Prior Function            PT Goals (current  goals can now be found in the care plan section) Acute Rehab PT Goals Patient Stated Goal: none stated PT Goal  Formulation: Patient unable to participate in goal setting Time For Goal Achievement: 03/20/14 Potential to Achieve Goals: Good Progress towards PT goals: Progressing toward goals    Frequency  Min 4X/week    PT Plan Current plan remains appropriate    Co-evaluation             End of Session Equipment Utilized During Treatment: Gait belt Activity Tolerance: Patient tolerated treatment well Patient left: in chair;with chair alarm set;with call bell/phone within reach     Time: 8588-5027 PT Time Calculation (min): 24 min  Charges:  $Gait Training: 23-37 mins                    G Codes:     Leighton Roach, PT  Acute Rehab Services  [phone]  Leighton Roach 03/12/2014, 3:17 PM

## 2014-03-12 NOTE — Progress Notes (Signed)
Subjective:  Doing well. Denies any pain or complaints. Says tolerating foods. Remains aphasic mostly. Wants say things but can't.   Objective: Vital signs in last 24 hours: Filed Vitals:   03/10/14 2037 03/11/14 1345 03/11/14 2024 03/12/14 0411  BP: 156/85 144/80 175/94 148/82  Pulse: 79 80 89 88  Temp: 99.5 F (37.5 C) 99 F (37.2 C) 98.9 F (37.2 C) 98.9 F (37.2 C)  TempSrc: Oral Oral Oral Oral  Resp: 18 17 18 18   Height:      Weight:      SpO2: 99% 98% 97% 97%   Weight change:   Intake/Output Summary (Last 24 hours) at 03/12/14 1028 Last data filed at 03/12/14 0900  Gross per 24 hour  Intake    480 ml  Output      0 ml  Net    480 ml    Vitals reviewed. General:lying in bed, calm. NAD. HEENT: PERRL, EOMI, no scleral icterus.  Cardiac: RRR, no rubs, murmurs or gallops Pulm: clear to auscultation bilaterally, no wheezes, rales, or rhonchi Abd: soft, nontender, nondistended, BS present Ext: warm and well perfused, no pedal edema Neuro: alert to self, couldn't assess full orientation. Has right facial droop.Slurred speech. Patient is aphasic mostly except for "NO" and "OK". No pronator drift. 4/5 strength right upper and lower ext. 5/5 on left extremities. Sensation intact.right sided XII nerve deficit. Facial droop on right side.   Left lower leg is cooler than the right leg. Left 5th toe appears blue and cold. It's severely TTP.  R leg has a immobilizer in place.  Lab Results: Basic Metabolic Panel:  Recent Labs Lab 03/09/14 0554 03/09/14 1225 03/10/14 0342  NA 140  --  143  K 3.8  --  3.6*  CL 102  --  106  CO2 23  --  21  GLUCOSE 97  --  96  BUN 16  --  14  CREATININE 1.15* 1.04 1.15*  CALCIUM 9.1  --  9.3   Liver Function Tests:  Recent Labs Lab 03/05/14 1508  AST 24  ALT 15  ALKPHOS 60  BILITOT 0.5  PROT 7.4  ALBUMIN 3.8   CBC:  Recent Labs Lab 03/05/14 1332 03/09/14 0355 03/09/14 1225  WBC 9.8 8.5 10.9*  NEUTROABS 7.5 5.6   --   HGB 14.9 13.9 13.5  HCT 43.6 40.9 39.5  MCV 98.4 98.3 96.8  PLT PLATELETS APPEAR DECREASED 218 221   Cardiac Enzymes:  Recent Labs Lab 03/05/14 1725 03/05/14 2245 03/06/14 0510  TROPONINI 0.37* 0.57* 0.43*   BNP: No results found for this basename: PROBNP,  in the last 168 hours D-Dimer: No results found for this basename: DDIMER,  in the last 168 hours CBG:  Recent Labs Lab 03/05/14 1426 03/09/14 1201 03/09/14 1635  GLUCAP 101* 94 113*   Hemoglobin A1C:  Recent Labs Lab 03/05/14 2245  HGBA1C 5.8*   Fasting Lipid Panel:  Recent Labs Lab 03/05/14 2240  CHOL 191  HDL 48  LDLCALC 122*  TRIG 106  CHOLHDL 4.0   Urine Drug Screen: Drugs of Abuse     Component Value Date/Time   LABOPIA NONE DETECTED 03/05/2014 2237   LABOPIA NEGATIVE 07/05/2011 0936   COCAINSCRNUR NONE DETECTED 03/05/2014 2237   COCAINSCRNUR NEGATIVE 07/05/2011 0936   LABBENZ NONE DETECTED 03/05/2014 2237   LABBENZ NEGATIVE 07/05/2011 0936   AMPHETMU NONE DETECTED 03/05/2014 2237   AMPHETMU NEGATIVE 07/05/2011 0936   THCU POSITIVE* 03/05/2014 2237  LABBARB NONE DETECTED 03/05/2014 2237    Alcohol Level: No results found for this basename: ETH,  in the last 168 hours Urinalysis:  Recent Labs Lab 03/05/14 2237  COLORURINE YELLOW  LABSPEC 1.010  PHURINE 7.5  GLUCOSEU NEGATIVE  HGBUR NEGATIVE  BILIRUBINUR NEGATIVE  KETONESUR 15*  PROTEINUR NEGATIVE  UROBILINOGEN 1.0  NITRITE NEGATIVE  LEUKOCYTESUR NEGATIVE  Studies/Results: No results found. Medications: I have reviewed the patient's current medications. Scheduled Meds: .  stroke: mapping our early stages of recovery book   Does not apply Once  . amLODipine  5 mg Oral Daily  . aspirin  81 mg Oral Daily  . atorvastatin  40 mg Oral q1800  . clopidogrel  75 mg Oral Daily  . enoxaparin (LOVENOX) injection  40 mg Subcutaneous Q24H  . metoprolol tartrate  12.5 mg Oral BID  . polyethylene glycol  17 g Oral Daily   Continuous  Infusions:  PRN Meds:. Assessment/Plan:   Ischemic Stroke - CT head showed acute infarct of superior left temporal lobe and portions of posterior left frontal lobe + left extreme capsule and insular cortex. There is no midline shift. There is cytotoxic edema. No acute hemorrhage. - MRA and MRI brain showed Acute large left middle cerebral artery territory infarct. Remote left posterior cerebral artery territory infarct.Doppler carotids showed no severe stenosis. Bilateral 1-39% ICA stenosis. TEE showed no clots.- hgbA1c 5.8. Lipid panel showed LDL 122.started lipitor 40mg  daily (LDL goal <70).  - ASA 300 PO. F/up neuro recs. - neuro checks - she has high risk of recurrent strokes. - PT and OT: CIR recommended. Speech recommended dysphagia 2 diet.  - Cont to work with PT/OT and speech therapy.  - Loop recorder placed before discharge per EP team Dr. Rayann Heman. - waiting for CIR placement. F/up stroke clinic outpatient 4-6 weeks.   Acute ischemic left 5th toe-likely 2/2 to chronic vascular disease - consulted vascular surgery. Had arteriogram today. 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 The only option for revascularization would be a right to left fem-fem bypass graft, which has high risk given recent stroke and NSTEMI. In addition she would be at significant risk for wound healing problems and infection including the risk of graft infection given her obesity.  If the foot progresses her only options would be to attempt revascularization despite the risks or she would require a left above-the-knee amputation.  Elevated trops - trop down trended-0.57>0.43.  Elevation could be 2/2 to demand ischemia in the setting of stroke and HTN or NSTEMI. - ECHO shows EF 55% with basal inferior and inferolateral hypokinesis. 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 - denies chest pain. There are no signs of ongoing ischemia.Cards wouldn't intervene for NSTEMI such as CATH since  she is in acute stroke and that would be high risk. Will get outpatient stress test in 1 month per cardiology.  HTN - mildly elevated. - on  metoprolol 12.5mg  BID- doesn't need permissive hypertension anymore per neuro.  CKD - stable now  - not sure about baseline Crt, seems to be around 1.6. Currently 1.04 better than baseline   Diet: dysphagia 2.  Code: FULL  Dispo: Disposition is deferred at this time, awaiting improvement of current medical problems.  Anticipated discharge in approximately 2-3 day(s).   The patient does have a current PCP (No primary provider on file.) and does need an [facility] hospital follow-up appointment after discharge.  The patient does not know have transportation limitations  that hinder transportation to clinic appointments.  .Services Needed at time of discharge: Y = Yes, Blank = No PT:   OT:   RN:   Equipment:   Other:     LOS: 7 days   Dellia Nims, MD 03/12/2014, 10:28 AM

## 2014-03-12 NOTE — Progress Notes (Addendum)
Nursing Note: At the bedside and attempted to admit pt ,but unable to. pt is deaf,has R sided weakness and writes to communicate and is r handed. Pt was unable to write anything that was clear enough to read. Pt able to understand what I wrote for her and can articulate short simple phrases..Unable to collect history from ,pt as she became very frustrated and began to cry with her frustration.Pt seemed to understand most of what I wrote but unclear when it came to her history.  I wrote and explained that her stroke can cause her to have difficulty communcating what she wants to say and not to be too frustrated and that it will improve.Pt able to smile and gesture that she understood.Encouragement provided.Pt nods that her daughter will be in today and will be able to assist w/ collection of her medical history.Pt oriented to the room and use of the call bell. Bed low,locked and call bell is in reach.wbb

## 2014-03-12 NOTE — Plan of Care (Signed)
Problem: Discharge/Transitional Outcomes Goal: Independent mobility/functioning independent or with min Independent mobility/functioning independently or with minimal assistance  Outcome: Progressing Patient has better mobility on affected side as of this assessment - Patient is still unable to meed ADL needs. Avie Echevaria 03/12/2014 3:30 AM

## 2014-03-12 NOTE — Progress Notes (Signed)
Rehab admissions - I spoke with the attending MD.  We can admit to inpatient rehab today once loop recorder is placed.  Will plan to admit to acute inpatient rehab later today.  Call me for questions.  #[phone]

## 2014-03-12 NOTE — Plan of Care (Signed)
Problem: Discharge/Transitional Outcomes Goal: Tolerating diet/TF at goal rate-PEG if inadequate intake Outcome: Completed/Met Date Met:  03/12/14 Patient eating well on dysphagia 2 diet. Able to feed self with left hand.  Goal: Family and patient agree upon discharge plan Outcome: Completed/Met Date Met:  03/12/14 Family informed of bed available and acceptance to Inpt rehab. Patient agreeable with plan.

## 2014-03-13 ENCOUNTER — Inpatient Hospital Stay (HOSPITAL_COMMUNITY): Payer: Medicare Other | Admitting: Rehabilitation

## 2014-03-13 ENCOUNTER — Inpatient Hospital Stay (HOSPITAL_COMMUNITY): Payer: Medicare Other | Admitting: Speech Pathology

## 2014-03-13 ENCOUNTER — Encounter (HOSPITAL_COMMUNITY): Payer: Self-pay | Admitting: *Deleted

## 2014-03-13 ENCOUNTER — Inpatient Hospital Stay (HOSPITAL_COMMUNITY): Payer: Medicare Other | Admitting: Occupational Therapy

## 2014-03-13 LAB — COMPREHENSIVE METABOLIC PANEL
ALBUMIN: 3.3 g/dL — AB (ref 3.5–5.2)
ALT: 17 U/L (ref 0–35)
AST: 27 U/L (ref 0–37)
Alkaline Phosphatase: 58 U/L (ref 39–117)
Anion gap: 15 (ref 5–15)
BUN: 19 mg/dL (ref 6–23)
CO2: 22 meq/L (ref 19–32)
Calcium: 9.4 mg/dL (ref 8.4–10.5)
Chloride: 102 mEq/L (ref 96–112)
Creatinine, Ser: 1.2 mg/dL — ABNORMAL HIGH (ref 0.50–1.10)
GFR calc Af Amer: 55 mL/min — ABNORMAL LOW (ref >90)
GFR calc non Af Amer: 47 mL/min — ABNORMAL LOW (ref >90)
Glucose, Bld: 92 mg/dL (ref 70–99)
POTASSIUM: 4.2 meq/L (ref 3.7–5.3)
Sodium: 139 mEq/L (ref 137–147)
Total Bilirubin: 0.5 mg/dL (ref 0.3–1.2)
Total Protein: 7.2 g/dL (ref 6.0–8.3)

## 2014-03-13 LAB — CBC WITH DIFFERENTIAL/PLATELET
BASOS PCT: 0 % (ref 0–1)
Basophils Absolute: 0 10*3/uL (ref 0.0–0.1)
Eosinophils Absolute: 0.2 10*3/uL (ref 0.0–0.7)
Eosinophils Relative: 3 % (ref 0–5)
HCT: 40.1 % (ref 36.0–46.0)
HEMOGLOBIN: 13.8 g/dL (ref 12.0–15.0)
LYMPHS PCT: 28 % (ref 12–46)
Lymphs Abs: 2.2 10*3/uL (ref 0.7–4.0)
MCH: 33.1 pg (ref 26.0–34.0)
MCHC: 34.4 g/dL (ref 30.0–36.0)
MCV: 96.2 fL (ref 78.0–100.0)
MONOS PCT: 10 % (ref 3–12)
Monocytes Absolute: 0.8 10*3/uL (ref 0.1–1.0)
NEUTROS ABS: 4.9 10*3/uL (ref 1.7–7.7)
NEUTROS PCT: 59 % (ref 43–77)
Platelets: 248 10*3/uL (ref 150–400)
RBC: 4.17 MIL/uL (ref 3.87–5.11)
RDW: 12.6 % (ref 11.5–15.5)
WBC: 8.1 10*3/uL (ref 4.0–10.5)

## 2014-03-13 MED ORDER — HYDROCODONE-ACETAMINOPHEN 5-325 MG PO TABS
1.0000 | ORAL_TABLET | ORAL | Status: DC | PRN
  Administered 2014-03-13: 2 via ORAL
  Administered 2014-03-14 (×2): 1 via ORAL
  Administered 2014-03-15 – 2014-03-17 (×4): 2 via ORAL
  Filled 2014-03-13 (×3): qty 2
  Filled 2014-03-13: qty 1
  Filled 2014-03-13 (×2): qty 2
  Filled 2014-03-13: qty 1

## 2014-03-13 NOTE — Progress Notes (Signed)
NT told RN about overhearing the surgeon coming up to see patient this morning say something along the lines that the patient shouldn't bear weight on that left foot.  Clarified with Algis Liming, PA.  She said he may have said that because the left foot is more painful with weight applied, but it is still ok to bear weight through that extremity considering her other foot is limited now due to recent stroke.  Brita Romp, RN

## 2014-03-13 NOTE — Progress Notes (Signed)
PMR Admission Coordinator Pre-Admission Assessment  Patient: Traci Mclaughlin is an 63 y.o., female  MRN: [id number]  DOB: [birth year]  Height: 5\' 4"  (162.6 cm)  Weight: 92.987 kg (205 lb)  Insurance Information  HMO: yes PPO: PCP: IPA: 80/20: OTHER: medicare replacement policy  PRIMARY: Bubba Hales Policy#: [id number] Subscriber: pt  CM Name: Tenny Craw Phone#: [phone] ext [phone] Fax#: [phone] to be followed by onsite reviewer Colbert Coyer [phone]  Pre-Cert#: [id number] Employer: retired  Benefits: Phone #: [phone] Name: 8/13  Eff. Date: 07/27/13 Deduct: none Out of Pocket Max: [phone] Life Max: none  CIR: $430 copay per day days 1-4 SNF: no copay days 1-20; $155 copay per day days 21-64; no copay days 65 to 100  Outpatient: $40 copay per visit Co-Pay: no max  Home Health: 100% Co-Pay: no copay  DME: 80% Co-Pay: 20%  Providers: in network   SECONDARY: none  Emergency Contact Information  Contact Information    Name  Relation  Home  Work  Mobile    [place]  Daughter  [place]  [phone]        Current Medical History  Patient Admitting Diagnosis: Left MCA infarct with R HP and aphasia  History of Present Illness: Traci Mclaughlin is a 63 y.o. female with history of HTN, HOH, cervical cancer; who was admitted on 03/05/14 with right sided weakness, right facial droop and difficulty speaking X 1 day. Family was able to convince her to come to treat treatment and CT head revealed acute infarct superior left temporal lobe, portions of the posterior left frontal lobe, left extreme capsule and insular cortex with cytotoxic edema. 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 2D echo with EF 55% with basal inferior and inferior lateral hypokinesis and mid to apical inferolateral akinesis. Patient with elevated cardiac enzymes and cardiology  felt changes due to [facility] stroke. Carotid dopplers without significant ICA stenosis. 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 MD and rehab team recommending CIR for further therapies. Patient to have loop recorder placed 03/12/14. No further vascular surgery is planned at this time.  Total: 6=NIH  GCS=14  Past Medical History  Past Medical History   Diagnosis  Date   .  Cancer      cervical; was on chemo; has been in remission for 98yrs   .  STEMI (ST elevation myocardial infarction)  06/2011   .  Acute kidney injury  07/06/2011   .  Hypertension    .  HOH (hard of hearing)     Family History  family history includes Intracerebral hemorrhage in her daughter.  Prior Rehab/Hospitalizations: hysterectomy 8 years ago for cervical cancer; no prior rehab  Current Medications  Current facility-administered medications: stroke: mapping our early stages of recovery book, , Does not apply, Once, Wilber Oliphant, MD; acetaminophen (TYLENOL) tablet 650 mg, 650 mg, Oral, Q4H PRN, Angelia Mould, MD; amLODipine (NORVASC) tablet 5 mg, 5 mg, Oral, Daily, Blain Pais, MD, 5 mg at 03/12/14 1118; aspirin chewable tablet 81 mg, 81 mg, Oral, Daily, Pixie Casino, MD, 81 mg at 03/12/14 1118  atorvastatin (LIPITOR) tablet 40 mg, 40 mg, Oral, q1800, Pixie Casino, MD, 40 mg  at 03/11/14 1727; clopidogrel (PLAVIX) tablet 75 mg, 75 mg, Oral, Daily, Pixie Casino, MD, 75 mg at 03/12/14 1118; enoxaparin (LOVENOX) injection 40 mg, 40 mg, Subcutaneous, Q24H, Angelia Mould, MD, 40 mg at 03/12/14 0092; hydrALAZINE (APRESOLINE) injection 10 mg, 10 mg, Intravenous, Q6H PRN, Angelia Mould, MD   HYDROcodone-acetaminophen (NORCO/VICODIN) 5-325 MG per tablet 1 tablet, 1 tablet, Oral, Q6H PRN, Bethena Roys, MD, 1 tablet at 03/10/14 2226; metoprolol tartrate (LOPRESSOR) tablet 12.5 mg, 12.5 mg, Oral, BID, Pixie Casino, MD, 12.5 mg at 03/12/14 1118; ondansetron (ZOFRAN) injection 4 mg, 4 mg, Intravenous, Q6H PRN, Angelia Mould, MD  polyethylene glycol (MIRALAX / GLYCOLAX) packet 17 g, 17 g, Oral, Daily, Blain Pais, MD, 17 g at 03/11/14 1515  Patients Current Diet: Dysphagia 2 with thin liquids  Precautions / Restrictions  Precautions  Precautions: Fall  Precaution Comments: some impulsivity, likely trying to second guess what they therapist wants before being told  Restrictions  Weight Bearing Restrictions: No  Other Position/Activity Restrictions: After consenting tx, nursing reported pt is getting a repeat doppler to LLE for edema and pain  Prior Activity Level  Limited Community (1-2x/wk): Daughter [name] states pt. is out of the house 2-3 times per week. Uses [facility] to go to MD appointments and to visit her daughter.  Home Assistive Devices / Equipment  Home Assistive Devices/Equipment: None  Prior Functional Level  Prior Function  Level of Independence: Independent  Comments: Patient's daughter states patient was perfoming all tasks independently  Current Functional Level  Cognition  Arousal/Alertness: Awake/alert  Overall Cognitive Status: Impaired/Different from baseline  Difficult to assess due to: Hard of hearing/deaf;Impaired communication  Current Attention Level: Divided  Orientation Level: Oriented to person;Oriented to place  Following Commands: Follows one step commands with increased time  Safety/Judgement: Decreased awareness of safety  Attention: Sustained  Sustained Attention: Appears intact  Awareness: (emergent awareness of linguistic errors appears intact)  Behaviors: Poor frustration tolerance   Extremity Assessment  (includes  Sensation/Coordination)  Upper Extremity Assessment: Generalized weakness  Lower Extremity Assessment: Generalized weakness  Cervical / Trunk Assessment: Kyphotic   ADLs  Overall ADL's : Needs assistance/impaired  Eating/Feeding: Minimal assistance (requires min assist with bringing food>mouth)  Eating/Feeding Details (indicate cue type and reason): tactile cueing  Grooming: Min guard  Grooming Details (indicate cue type and reason): Min guard for dynamic standing and hand over hand for actual grooming task due to poor proprioception  Upper Body Bathing: Minimal assitance  Lower Body Bathing: Minimal assistance  Upper Body Dressing : Minimal assistance  Lower Body Dressing: Moderate assistance  Toilet Transfer: Minimal assistance  Toileting- Clothing Manipulation and Hygiene: Minimal assistance  Tub/ Shower Transfer: Minimal assistance  Functional mobility during ADLs: Minimal assistance  General ADL Comments: Patient functioning at an overall min assist for functional tasks. Patient requires multimodal cues to complete tasks. Due to aphasia, patient does require tactile and visual cueing. Patient with decreased dynamic standing balance/tolerance/endurance and tends to fatigue easily. Patient with noted decreased proprioception and sensation > RUE, unable to fully assess due to aphasia/cognition.   Mobility  Overal bed mobility: Needs Assistance  Bed Mobility: Supine to Sit  Rolling: Min guard  Sidelying to sit: Min guard  Supine to sit: Min assist  Sit to supine: Min assist  General bed mobility comments: heavy use of the rail; R leg lagging and not as functional due to KI   Transfers  Overall transfer level: Needs assistance  Equipment  used: Rolling walker (2 wheeled)  Transfers: Sit to/from [employer] to Stand: Min assist  Stand pivot transfers: Min assist  General transfer comment: cues (v/t) for hand placement; stability assist   Ambulation / Gait / Stairs /  Wheelchair Mobility  Ambulation/Gait  Ambulation/Gait assistance: Fish farm manager (Feet): 130 Feet  Assistive device: Rolling walker (2 wheeled)  Gait Pattern/deviations: Step-through pattern  Gait velocity: slower  Gait velocity interpretation: Below normal speed for age/gender  General Gait Details: mildly hemiparetic gait with need for minimal w/shift assist   Posture / Balance  Overall balance assessment: Needs assistance  Sitting balance-Leahy Scale: Fair  Standing balance-Leahy Scale: Poor   Special needs/care consideration  Skin no skin issues reported by RN or pt.  Bowel mgmt: Last BM08/17/15  Bladder mgmt: Voiding with urgency up on bedside commode.  Diabetic mgmt No   Previous Home Environment  Living Arrangements: Children  Lives With: Daughter  Available Help at Discharge: Family;Available 24 hours/day;Other (Comment) (daughter states pt. can live with her)  Type of Home: House  Home Layout: Two level;Able to live on main level with bedroom/bathroom  Home Access: Stairs to enter  Entrance Stairs-Rails: None  Entrance Stairs-Number of Steps: 3-4  Bathroom Shower/Tub: Futures trader: Yes  How Accessible: Accessible via walker  Home Care Services: No  Additional Comments: Family present and communicated with therapist. Patient unable fully understand questions or respond accurently  Discharge Living Setting  Plans for Discharge Living Setting: Other (Comment) (home with daughter [name] , location TBD)  Type of Home at Discharge: Other (Comment) (TBD, either [place] or [place])  Does the patient have any problems obtaining your medications?: Yes (Describe) (if its not covered by medicare. )  Social/Family/Support Systems  Patient Roles: Parent  Contact Information: daughter Guy Franco [phone]  Anticipated Caregiver: daughter Loel Ro, who is about to reunite with her husband and move back  to [place]. [name] says she may move back to [place] once her mother has Thomas from rehab. [name] has a home in [place] as well  Anticipated Ambulance person Information: Loel Ro, daughter, [phone]  Ability/Limitations of Caregiver: none, she does not work outside of the home, has 3 children  Caregiver Availability: 24/7  Discharge Plan Discussed with Primary Caregiver: Yes  Is Caregiver In Agreement with Plan?: Yes  Does Caregiver/Family have Issues with Lodging/Transportation while Pt is in Rehab?: No  Goals/Additional Needs  Patient/Family Goal for Rehab: mod I with PT/OT, supervision to min assist with SLP  Expected length of stay: 7-10 days  Cultural Considerations: no  Dietary Needs: dysphagia 2 , thin liquids  Equipment Needs: TBD  Additional Information: Daughter [name] states that she and her sister [name] do not speak. She reports [name] is having some type of knee surgery today but knows no details. Pt. has been living with [name] up until admission but has just received appproval for subsidized housing and was about to move into her own apartment;  Pt/Family Agrees to Admission and willing to participate: Yes  Program Orientation Provided & Reviewed with Pt/Caregiver Including Roles & Responsibilities: Yes  Decrease burden of Care through IP rehab admission: no  Possible need for SNF placement upon discharge: Not anticipated  Patient Condition: This patient's medical and functional status has changed since the consult dated: 03/07/14 in which the Rehabilitation Physician determined and documented that the patient's condition is appropriate for intensive rehabilitative care in an inpatient rehabilitation facility. See "History  of Present Illness" (above) for medical update. Functional changes are: Currently requiring min assist to ambulate 130 ft. RW. Patient's medical and functional status update has been discussed with the Rehabilitation physician and patient  remains appropriate for inpatient rehabilitation. Will admit to inpatient rehab today.  Preadmission Screen Completed By: Jodell Cipro MPT , 03/12/2014 2:34 PM  ______________________________________________________________________  Discussed status with Dr. Naaman Plummer on 03/12/14 at 1443 and received telephone approval for admission today.  Admission Coordinator: Retta Diones, PT time1443/Date08/17/15  Cosigned by: Meredith Staggers, MD [03/12/2014 2:52 PM]   Revision History.Marland KitchenMarland Kitchen

## 2014-03-13 NOTE — Progress Notes (Signed)
[facility] PHYSICAL MEDICINE & REHABILITATION     PROGRESS NOTE    Subjective/Complaints: Pt easily tearful/emotionsl per RN. Had fair night.  Objective: Vital Signs: Blood pressure 157/81, pulse 78, temperature 98.4 F (36.9 C), temperature source Oral, resp. rate 18, weight 89.8 kg (197 lb 15.6 oz), SpO2 98.00%. No results found.  Recent Labs  03/13/14 0519  WBC 8.1  HGB 13.8  HCT 40.1  PLT 248    Recent Labs  03/13/14 0519  NA 139  K 4.2  CL 102  GLUCOSE 92  BUN 19  CREATININE 1.20*  CALCIUM 9.4   CBG (last 3)  No results found for this basename: GLUCAP,  in the last 72 hours  Wt Readings from Last 3 Encounters:  03/13/14 89.8 kg (197 lb 15.6 oz)  03/08/14 92.987 kg (205 lb)  03/08/14 92.987 kg (205 lb)    Physical Exam:  Constitutional: She appears well-developed and well-nourished. anxious  HENT: dentition poor, oral mucosa moist  Head: Normocephalic and atraumatic.  Eyes: Conjunctivae are normal. Pupils are equal, round, and reactive to light. Tends to close left eye to read--question visual deficits.  Neck: Normal range of motion. Neck supple.  Cardiovascular: Regular rhythm. Tachycardia present.  Respiratory: Effort normal and breath sounds normal. No respiratory distress.  GI: Soft. Bowel sounds are normal. She exhibits no distension.  Musculoskeletal: She exhibits no edema. Left leg and foot painful with palpation or manipulation RIght knee discomfort with ROM.  Neurological: She is alert.  Delayed speech/processing---just waking up. HOH. Dysarthric speech. Fluent aphasia with improving content. Needs visual cues to follow simple commands. Some apraxia. Right sided weakness noted. Her speech is slurred. She is slowed. She is inattentive.  4-/5 R delt, bi, tri, grip  4/5 R HF, KE, ADF/APF  5/5 on Left side except for left foot which is limited due to pain  Skin: ischemia noted at left fourth and fifth toes---areas are quite painful to touch.   Psych: resting, calmer this am   Assessment/Plan: 1. Functional deficits secondary to left MCA infarct and ischemic left lower extremity which require 3+ hours per day of interdisciplinary therapy in a comprehensive inpatient rehab setting. Physiatrist is providing close team supervision and 24 hour management of active medical problems listed below. Physiatrist and rehab team continue to assess barriers to discharge/monitor patient progress toward functional and medical goals. FIM:                   Comprehension Comprehension Mode: Visual (DEAF,communicated w/ writing,nods) Comprehension: 4-Understands basic 75 - 89% of the time/requires cueing 10 - 24% of the time  Expression Expression Mode: Verbal (pt read,then able to speak,slurred) Expression Assistive Devices: 6-Communication board Expression: 5-Expresses basic needs/ideas: With extra time/assistive device  Social Interaction Social Interaction: 5-Interacts appropriately 90% of the time - Needs monitoring or encouragement for participation or interaction.  Problem Solving Problem Solving: 3-Solves basic 50 - 74% of the time/requires cueing 25 - 49% of the time  Memory Memory: 4-Recognizes or recalls 75 - 89% of the time/requires cueing 10 - 24% of the time Medical Problem List and Plan:  1. Functional deficits secondary to L-MCA infarct with right hemiparesis, aphasia as well as ischemia left foot.  2. DVT Prophylaxis/Anticoagulation: Pharmaceutical: Lovenox  3. Pain Management: Indicating severe pain left foot. Will continue hydrocodone schedule bid as well as prn. Patient unable to ask for medication.  4. Mood: Labile and anxious in part due to pain as well as communication deficits.  Will have LCSW follow for evaluation and support.   -neuropsych involvement also 5. Neuropsych: This patient is not quite yet capable of making decisions on her own behalf.  6. Skin/Wound Care: Pressure relief measures.  7.  Chronic left iliac artery occlusion:  LLE PRAFO for prevention of breakdown. Continue prn medications for pain control.  -pain will be an issue with weight bearing/activities with therapy.  8. Hypokalemia: normal level this am    LOS (Days) 1 A FACE TO FACE EVALUATION WAS PERFORMED  SWARTZ,ZACHARY T 03/13/2014 8:51 AM

## 2014-03-13 NOTE — Care Management Note (Signed)
[facility] Individual Statement of Services  Patient Name:  Traci Mclaughlin  Date:  03/13/2014  Welcome to the [facility].  Our goal is to provide you with an individualized program based on your diagnosis and situation, designed to meet your specific needs.  With this comprehensive rehabilitation program, you will be expected to participate in at least 3 hours of rehabilitation therapies Monday-Friday, with modified therapy programming on the weekends.  Your rehabilitation program will include the following services:  Physical Therapy (PT), Occupational Therapy (OT), Speech Therapy (ST), 24 hour per day rehabilitation nursing, Therapeutic Recreaction (TR), Neuropsychology, Case Management (Social Worker), Rehabilitation Medicine, Nutrition Services and Pharmacy Services  Weekly team conferences will be held on Wednesday to discuss your progress.  Your Social Worker will talk with you frequently to get your input and to update you on team discussions.  Team conferences with you and your family in attendance may also be held.  Expected length of stay: 2 weeks Overall anticipated outcome: supervision with cues  Depending on your progress and recovery, your program may change. Your Social Worker will coordinate services and will keep you informed of any changes. Your Social Worker's name and contact numbers are listed  below.  The following services may also be recommended but are not provided by the [facility]:    [facility] will be made to provide these services after discharge if needed.  Arrangements include referral to agencies that provide these services.  Your insurance has been verified to be:  [employer]-Medicare Your primary doctor is:    Pertinent information will be shared with your doctor and your insurance company.  Social Worker:  Ovidio Kin, [place] or ([phone]  Information discussed with and copy given to patient by: [name], 03/13/2014, 3:11 PM

## 2014-03-13 NOTE — Progress Notes (Signed)
   VASCULAR SURGERY ASSESSMENT & PLAN:  * The patient has a chronic Left iliac artery occlusion. Her left foot remains stable. I will follow her up as an out-pt. Vascular will be available if needed. Would "float" left foot to prevent a pressure sore on her heel.   SUBJECTIVE: Does not appear to be in pain  PHYSICAL EXAM: Filed Vitals:   03/12/14 1822 03/12/14 2111 03/13/14 0620  BP: 164/82 134/91 157/81  Pulse: 89 94 78  Temp: 98.3 F (36.8 C) 98.8 F (37.1 C) 98.4 F (36.9 C)  TempSrc: Oral Oral Oral  Resp: 18 19 18   Weight:   197 lb 15.6 oz (89.8 kg)  SpO2: 99% 99% 98%   Left foot stable. Some discoloration of Left 5th toe and lat foot.  LABS: Lab Results  Component Value Date   WBC 8.1 03/13/2014   HGB 13.8 03/13/2014   HCT 40.1 03/13/2014   MCV 96.2 03/13/2014   PLT 248 03/13/2014   Lab Results  Component Value Date   CREATININE 1.20* 03/13/2014   Lab Results  Component Value Date   INR 1.07 03/05/2014   Active Problems:   CVA (cerebral infarction)  [name] Beeper: [phone] 03/13/2014

## 2014-03-13 NOTE — Progress Notes (Signed)
Physical Medicine and Rehabilitation Consult  Reason for Consult: Difficulty speaking  Referring Physician: Dr. Baxter Flattery  HPI: Traci Mclaughlin is a 63 y.o. female with history of HTN, HOH, cervical cancer; who was admitted on 03/05/14 with right sided weakness, right facial droop and difficulty speaking X 1 day. Family was able to convince her to come to treat treatment and CT head revealed acute infarct superior left temporal lobe, portions of the posterior left frontal lobe, left extreme capsule and insular cortex with cytotoxic edema. 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 2D echo with EF 55% with basal inferior and inferior lateral hypokinesis and mid to apical inferolateral akinesis. Patient with elevated cardiac enzymes and cardiology felt changes due to [facility] stroke. Carotid dopplers without significant ICA stenosis. 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 MD and rehab team recommending CIR for further therapies.  Daughter states her mom is HOH  Review of Systems  Unable to perform ROS: language   Past Medical History   Diagnosis  Date   .  Cancer      cervical; was on chemo; has been in remission for 75yrs   .  STEMI (ST elevation myocardial infarction)  06/2011   .  Acute kidney injury  07/06/2011   .  Hypertension    .  HOH (hard of hearing)     Past Surgical History   Procedure  Laterality  Date   .  Cardiac catheterization   07/03/2011     Dr. Irish Lack    Family History   Problem  Relation  Age of Onset   .  Intracerebral hemorrhage  Daughter      during childbirth    Social History: Lives with family.  Multiple extended family members at home can provide supervision. Per reports that she has quit smoking. Her smoking use included Cigarettes. She has a 1 pack-year smoking history. She quit smokeless tobacco use about 2 years ago. Per reports that she drinks wine occasionally. Per reports that she does not use illicit drugs.  Allergies: No Known Allergies  Medications Prior to Admission   Medication  Sig  Dispense  Refill   .  amLODipine (NORVASC) 5 MG tablet  Take 5 mg by mouth daily.     .  diclofenac sodium (VOLTAREN) 1 % GEL  Apply 4 g topically 4 (four) times daily.     Marland Kitchen  HYDROcodone-acetaminophen (NORCO/VICODIN) 5-325 MG per tablet  Take 1 tablet by mouth every 6 (six) hours as needed for moderate pain.      Home:  Home Living  Family/patient expects to be discharged to:: Unsure  Additional Comments: Family present and communicated with therapist. Patient unable fully understand questions or respond accurently  Functional History:  Prior Function  Level of Independence: Independent  Comments: Patient's daughter states patient was perfoming all tasks independently  Functional Status:  Mobility:  Bed Mobility  Overal bed mobility: Needs Assistance  Bed Mobility: Rolling;Sidelying to Sit  Rolling: Min guard  Sidelying to sit: Min guard  Transfers  Overall transfer level: Needs assistance  Equipment used: Rolling walker (2 wheeled)  Transfers: Sit to/from Stand  Sit to Stand: Min assist  Stand pivot transfers: Min assist  General transfer comment: Visual  cues for safety and direction were used as pt has trouble hearing verbal cues  Ambulation/Gait  Ambulation/Gait assistance: Min assist  Ambulation Distance (Feet): 50 Feet  Assistive device: Rolling walker (2 wheeled)  Gait Pattern/deviations: Step-through pattern;Decreased weight shift to right;Ataxic;Decreased dorsiflexion - right;Decreased step length - right;Decreased step length - left (R knee buckling when tired)  Gait  velocity: reduced  Gait velocity interpretation: Below normal speed for age/gender  General Gait Details: flexed posture on RW with min assist on R from PT, close guarding of chair. Pt demonstrates shorter steps with wide base, buckling on R knee until PT used visual cues to slow down then sit. Turns with difficulty and needs assist to decide limits   ADL:  ADL  Overall ADL's : Needs assistance/impaired  Eating/Feeding: Minimal assistance (requires min assist with bringing food>mouth)  Eating/Feeding Details (indicate cue type and reason): tactile cueing  Grooming: Min guard  Grooming Details (indicate cue type and reason): Min guard for dynamic standing and hand over hand for actual grooming task due to poor proprioception  Upper Body Bathing: Minimal assitance  Lower Body Bathing: Minimal assistance  Upper Body Dressing : Minimal assistance  Lower Body Dressing: Moderate assistance  Toilet Transfer: Minimal assistance  Toileting- Clothing Manipulation and Hygiene: Minimal assistance  Tub/ Shower Transfer: Minimal assistance  Functional mobility during ADLs: Minimal assistance  General ADL Comments: Patient functioning at an overall min assist for functional tasks. Patient requires multimodal cues to complete tasks. Due to aphasia, patient does require tactile and visual cueing. Patient with decreased dynamic standing balance/tolerance/endurance and tends to fatigue easily. Patient with noted decreased proprioception and sensation > RUE, unable to fully assess due to aphasia/cognition.  Cognition:  Cognition  Overall Cognitive Status: Impaired/Different from baseline  Arousal/Alertness: Awake/alert  Orientation Level: Other (comment) (uta due to aphasia)  Attention: Sustained  Sustained Attention: Appears intact  Awareness: (emergent awareness of linguistic errors appears intact)  Behaviors: Poor frustration tolerance  Cognition  Arousal/Alertness: Awake/alert  Behavior During  Therapy: WFL for tasks assessed/performed  Overall Cognitive Status: Impaired/Different from baseline  Area of Impairment: Memory;Following commands;Safety/judgement;Awareness;Problem solving  Memory: Decreased short-term memory  Following Commands: Follows multi-step commands inconsistently  Safety/Judgement: Decreased awareness of safety  Awareness: Intellectual  Problem Solving: Slow processing;Requires verbal cues;Requires tactile cues (Patient requires tactile cues secondary to [facility])  Difficult to assess due to: Hard of hearing/deaf;Impaired communication  Blood pressure 159/89, pulse 90, temperature 98.3 F (36.8 C), temperature source Oral, resp. rate 18, height 5\' 3"  (1.6 m), SpO2 96.00%.  Physical Exam  Vitals reviewed.  Constitutional: She appears well-developed and well-nourished.  HENT:  Head: Normocephalic and atraumatic.  Eyes: Conjunctivae are normal. Pupils are equal, round, and reactive to light.  Neck: Normal range of motion. Neck supple.  Cardiovascular: Regular rhythm. Tachycardia present.  Respiratory: Effort normal and breath sounds normal. No respiratory distress.  GI: Soft. Bowel sounds are normal. She exhibits no distension.  Musculoskeletal: She exhibits no edema.  Left knee discomfort with ROM.  Neurological: She is alert.  Anxious appearing. Unable to answer simple biographic Y/N question. Dysarthric speech. Fluent aphasia with verbal output limited to "I don't know", "my daughter is upstairs" (grandaughter came in towards end of exam and reported that pt's daughter having surgery today). Unable to read and follow instructions. Needs visual cues to follow simple commands. Right sided weakness noted.  Skin: Skin is warm and dry.  Psychiatric: Her mood appears anxious. Her speech is slurred. She is slowed. She  is inattentive.  3+/5 R delt, bi, tri, grip  4/5 R HF, KE, ADF/APF  5/5 on Left side  No results found for this or any previous visit (from the past 24  hour(s)).  Ct Head (brain) Wo Contrast  03/05/2014 CLINICAL DATA: Facial droop and dysarthria ; right-sided weakness EXAM: CT HEAD WITHOUT CONTRAST TECHNIQUE: Contiguous axial images were obtained from the base of the skull through the vertex without intravenous contrast. COMPARISON: None. FINDINGS: The ventricles are normal in size and configuration. There is no demonstrable mass, hemorrhage, extra-axial fluid collection, or midline shift. There is decreased attenuation in the superior left temporal lobe with involvement of the left extreme capsule and insular cortex as well consistent with an acute infarct with focal cytotoxic edema in this area. This recent appearing infarct extends more superiorly to involve a portion of the posterior left frontal lobe. Bony calvarium appears intact. The mastoid air cells are clear. There is nasal turbinate edema bilaterally. There is also decreased attenuation in a portion of the medial left occipital lobe which appears more chronic and may represent an older infarct. Elsewhere gray-white compartments appear unremarkable. IMPRESSION: Acute infarct involving portions of the superior left temporal lobe as well as portions of the posterior left frontal lobe. There is also involvement of the left extreme capsule and insular cortex. There is cytotoxic edema in these areas. There is decreased attenuation in the medial left occipital lobe with sparing of the post rib medial aspect of the left occipital lobe. Suspect older infarct in this area. There is no acute hemorrhage. There is no mass or midline shift. There is diffuse nasal turbinate edema bilaterally. Electronically Signed By: Lowella Grip M.D. On: 03/05/2014 14:29  Mr Brain Wo Contrast  03/06/2014 CLINICAL DATA: Right-sided weakness, speech difficulty. Acute left middle cerebral artery territory infarct. EXAM: MRI HEAD WITHOUT CONTRAST MRA HEAD WITHOUT CONTRAST TECHNIQUE: Multiplanar, multiecho pulse sequences of the  brain and surrounding structures were obtained without intravenous contrast. Angiographic images of the head were obtained using MRA technique without contrast. COMPARISON: CT of the head March 05, 2014 at 1408 hr FINDINGS: MRI HEAD FINDINGS Reduced diffusion within left frontal lobe with corresponding low ADC values. Mild expansile T2 hyperintense signal within the corresponding cortex. Local mass effect without midline shift. No susceptibility artifact to suggest hemorrhagic conversion. Left mesial occipital lobe transcortical encephalomalacia with mild ex vacuo dilatation of the left occipital horn. The ventricles and sulci are otherwise normal for patient's age. A few scattered sub cm supratentorial white matter T2 hyperintensities, exclusive of the aforementioned acute does infarct suggests chronic small vessel ischemic disease. No abnormal extra-axial fluid collections. No abnormal sellar expansion. No cerebellar tonsillar ectopia. Trace paranasal sinus mucosal thickening with small right maxillary mucous retention cyst, the mastoid air cells appear well-aerated. Ocular globes and orbital contents are unremarkable. No suspicious calvarial bone marrow signal. MRA HEAD FINDINGS Anterior circulation: Normal flow related and enhancement of the included cervical, petrous, cavernous and supra clinoid internal carotid arteries. Normal flow related enhancement of the left M1 segment and left middle cerebral artery bifurcation. Meniscus and occlusion of left superior M2 branch, axial 42/126. Normal flow related enhancement of the anterior cerebral arteries and right middle cerebral artery. Posterior circulation: Left vertebral artery is dominant. Normal flow related enhancement of vertebral basilar system, patent main branch vessels. Small right posterior communicating artery present. Thready left P2 segment with absent left P3 flow related enhancement. No aneurysm. IMPRESSION: MRI head: Acute large left middle  cerebral artery  territory infarct. Remote left posterior cerebral artery territory infarct. MRA head: Occluded left M2 superior branch consistent with acute thromboembolic disease. Occluded versus less likely slow flow left P3 segment, thready left P2 segment. Electronically Signed By: Elon Alas On: 03/06/2014 01:00  Dg Chest Port 1 View  03/05/2014 CLINICAL DATA: LEFT side stroke, weakness, slurred speech, past history MI, cervical cancer, hypertension EXAM: PORTABLE CHEST - 1 VIEW COMPARISON: Portable exam 1455 hr compared to 07/03/2011 FINDINGS: Upper normal heart size. Normal mediastinal contours and pulmonary vascularity. Slight rotation to the LEFT. Lungs grossly clear. No pleural effusion, pneumothorax or acute osseous findings. IMPRESSION: No acute abnormalities. Electronically Signed By: Lavonia Dana M.D. On: 03/05/2014 15:01  Mr Jodene Nam Head/brain Wo Cm  03/06/2014 CLINICAL DATA: Right-sided weakness, speech difficulty. Acute left middle cerebral artery territory infarct. EXAM: MRI HEAD WITHOUT CONTRAST MRA HEAD WITHOUT CONTRAST TECHNIQUE: Multiplanar, multiecho pulse sequences of the brain and surrounding structures were obtained without intravenous contrast. Angiographic images of the head were obtained using MRA technique without contrast. COMPARISON: CT of the head March 05, 2014 at 1408 hr FINDINGS: MRI HEAD FINDINGS Reduced diffusion within left frontal lobe with corresponding low ADC values. Mild expansile T2 hyperintense signal within the corresponding cortex. Local mass effect without midline shift. No susceptibility artifact to suggest hemorrhagic conversion. Left mesial occipital lobe transcortical encephalomalacia with mild ex vacuo dilatation of the left occipital horn. The ventricles and sulci are otherwise normal for patient's age. A few scattered sub cm supratentorial white matter T2 hyperintensities, exclusive of the aforementioned acute does infarct suggests chronic small vessel  ischemic disease. No abnormal extra-axial fluid collections. No abnormal sellar expansion. No cerebellar tonsillar ectopia. Trace paranasal sinus mucosal thickening with small right maxillary mucous retention cyst, the mastoid air cells appear well-aerated. Ocular globes and orbital contents are unremarkable. No suspicious calvarial bone marrow signal. MRA HEAD FINDINGS Anterior circulation: Normal flow related and enhancement of the included cervical, petrous, cavernous and supra clinoid internal carotid arteries. Normal flow related enhancement of the left M1 segment and left middle cerebral artery bifurcation. Meniscus and occlusion of left superior M2 branch, axial 42/126. Normal flow related enhancement of the anterior cerebral arteries and right middle cerebral artery. Posterior circulation: Left vertebral artery is dominant. Normal flow related enhancement of vertebral basilar system, patent main branch vessels. Small right posterior communicating artery present. Thready left P2 segment with absent left P3 flow related enhancement. No aneurysm. IMPRESSION: MRI head: Acute large left middle cerebral artery territory infarct. Remote left posterior cerebral artery territory infarct. MRA head: Occluded left M2 superior branch consistent with acute thromboembolic disease. Occluded versus less likely slow flow left P3 segment, thready left P2 segment. 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 Does the need for close, 24 hr/day medical supervision in concert with the patient's rehab needs make it unreasonable for this patient to be served in a less intensive setting? Yes 2. Co-Morbidities requiring supervision/potential complications: CKD 3, HTN, Hearing impainment 3. Due to bladder management, bowel management, safety, skin/wound care, disease management, medication administration, pain management and patient  education, does the patient require 24 hr/day rehab nursing? Yes 4. Does the patient require coordinated care of a physician, rehab nurse, PT (1-2 hrs/day, 5 days/week), OT (1-2 hrs/day, 5 days/week) and SLP (.5-1 hrs/day, 5 days/week) to address physical and functional deficits in the context of the above medical diagnosis(es)? Yes Addressing deficits in the  following areas: balance, endurance, locomotion, strength, transferring, bowel/bladder control, bathing, dressing, feeding, grooming, toileting, cognition, speech, language, swallowing and psychosocial support 5. Can the patient actively participate in an intensive therapy program of at least 3 hrs of therapy per day at least 5 days per week? Yes 6. The potential for patient to make measurable gains while on inpatient rehab is excellent 7. Anticipated functional outcomes upon discharge from inpatient rehab are modified independent with PT, modified independent with OT, sup/minA with SLP. 8. Estimated rehab length of stay to reach the above functional goals is: 7-10days 9. Does the patient have adequate social supports to accommodate these discharge functional goals? Yes 10. Anticipated D/C setting: Home 11. Anticipated post D/C treatments: [place] therapy 12. Overall Rehab/Functional Prognosis: excellent RECOMMENDATIONS:  This patient's condition is appropriate for continued rehabilitative care in the following setting: CIR  Patient has agreed to participate in recommended program. Yes  Note that insurance prior authorization may be required for reimbursement for recommended care.  Comment: Per daughter pt has earing aide that she refuses to wear  03/07/2014  Revision History...      Date/Time User Action    03/07/2014 1:14 PM Charlett Blake, MD Sign    03/07/2014 9:22 AM Bary Leriche, PA-C Share   View Details Report    Routing History.Marland KitchenMarland Kitchen

## 2014-03-13 NOTE — Evaluation (Addendum)
Speech Language Pathology Assessment and Plan  Patient Details  Name: Traci Mclaughlin MRN: [id number] Date of Birth: 11/12/1950  SLP Diagnosis: Aphasia;Dysphagia;Dysarthria;Cognitive Impairments  Rehab Potential: Good ELOS: 14-16 days     Today's Date: 03/13/2014 SLP Individual Time: 1301-1401 SLP Individual Time Calculation (min): 60 min   Problem List:  Patient Active Problem List   Diagnosis Date Noted  . Elevated troponin 03/11/2014  . 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 Right hemiplegia 03/07/2014  . Dyslipidemia 03/07/2014  . Stroke 03/05/2014  . CKD (chronic kidney disease) stage 3, GFR 30-59 ml/min 03/05/2014  . Hypertension 03/05/2014  . CVA (cerebral infarction) 03/05/2014  . Elevation of cardiac enzymes 07/06/2011  . Cervical cancer 07/06/2011  . Viral gastroenteritis 07/06/2011  . Acute kidney injury 07/06/2011   Past Medical History:  Past Medical History  Diagnosis Date  . Cancer     cervical; was on chemo; has been in remission for 4yrs  . STEMI (ST elevation myocardial infarction) 06/2011  . Acute kidney injury 07/06/2011  . Hypertension   . HOH (hard of hearing)   . CVA (cerebral infarction)    Past Surgical History:  Past Surgical History  Procedure Laterality Date  . Cardiac catheterization  07/03/2011    Dr. Varanasi  . Tee without cardioversion N/A 03/08/2014    Procedure: TRANSESOPHAGEAL ECHOCARDIOGRAM (TEE);  Surgeon: Paula [name] V, MD;  Location: [facility];  Service: Cardiovascular;  Laterality: N/A;  . 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 female with history of HTN, Hearing impaired, cervical cancer; who was admitted on 03/05/14 with right sided weakness, right facial droop and difficulty speaking X 1 day. Family was able to convince her to come to treat treatment and  CT head revealed acute infarct superior left temporal lobe, portions of the posterior left frontal lobe, left extreme capsule and insular cortex with cytotoxic edema. 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 2D echo with EF 55% with basal inferior and inferior lateral hypokinesis and mid to apical inferolateral akinesis. UDS positive for THC. Patient with elevated cardiac enzymes and cardiology felt changes due to L-MCA stroke. Carotid dopplers without significant ICA stenosis. 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 Dr. Allred consulted for input and TEE done revealing normal aorta, no thrombus or PFO and mildly depressed LVEF. Loop recorder placed on 03/12/14.  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 CIR recommended by rehab team and patient admitted 03/12/2014.  SLP evaluation completed 03/13/2014 with the following results: Pt presents with severe global aphasia with receptive language more intact than expressive.  Pt was noted to produce short, automatic phrases for social responses in addition to rote phrases such as "I can't," "I don't know," or "I know it but I can't say it."  Furthermore, pt was noted with decreased confrontational naming of familiar objects despite the use of written aids; however, pt was able to match word to object from a field of three with extra time.  Pt was able to follow 1-step commands with the use of a written visual aid and overall min assist; however, pt was noted with breakdown   for following 2-step commands.  Additionally, pt answered personally relevant or immediate environmental questions with the use of a written aid and min assist; however, she was noted with  breakdown for answering semi-complex or abstract yes/no questions.  Pt was unable to return demonstration of basic communication board available in room to indicate needs/wants due to reading comprehension impairments for abstract concepts and information.  Recommend that staff provide pt with 2-3 written choices at a time to allow pt to make needs/wants known or answer basic questions.   Pt requires large print when using written aids as she reports she has lost her glasses and is waiting on a replacement pair.   SLP also completed a bedside swallowing evaluation on this date with pt exhibiting no overt s/s of aspiration across any consistencies assessed including thin liquids, purees, and solid cracker consistencies.  Pt was noted with adequate oral manipulation, mastication and clearance of all textures with no resulting residue, although pt complained of pain when chewing and presentations were limited as a result.  Recommend that pt continue on a dys 2 diet with thin liquids for comfort with trial meal tray to assess for diet advancement in 1-2 days.   Pt would benefit from skilled speech therapy while inpatient targeting cognitive-linguistic function and dysphagia management in order to maximize functional independence and reduce burden of care upon discharge.     Skilled Therapeutic Interventions          Cognitive-linguistic and bedside evaluation completed with results and recommendations reviewed with pt.     SLP Assessment  Patient will need skilled Speech Lanaguage Pathology Services during CIR admission    Recommendations  Diet Recommendations: Dysphagia 2 (Fine chop);Thin liquid Liquid Administration via: Cup;Straw Medication Administration: Whole meds with liquid Supervision: Patient able to self feed Compensations: Slow rate;Small sips/bites;Check for pocketing Postural Changes and/or Swallow Maneuvers: Seated upright 90 degrees Oral Care Recommendations: Oral care  BID Recommendations for Other Services: Neuropsych consult Patient destination: Home Follow up Recommendations: Home Health SLP;24 hour supervision/assistance Equipment Recommended: None recommended by SLP    SLP Frequency 5 out of 7 days   SLP Treatment/Interventions Patient/family education;Functional tasks;Dysphagia/aspiration precaution training;Cognitive remediation/compensation;Cueing hierarchy;Environmental controls;Internal/external aids;Multimodal communication approach;Speech/Language facilitation    Pain Pain Assessment Pain Assessment: Faces Pain Score: Asleep Faces Pain Scale: Hurts even more Pain Type: Acute pain Pain Location: Leg Pain Orientation: Left Pain Descriptors / Indicators: Sharp Pain Onset: On-going Pain Intervention(s): RN made aware Multiple Pain Sites: No Prior Functioning Cognitive/Linguistic Baseline: Within functional limits Type of Home: House  Lives With: Daughter Available Help at Discharge: Family;Available 24 hours/day (per report) Education: unknown secondary to expressive aphasia  Vocation: Retired (per report )  Short Term Goals: Week 1: SLP Short Term Goal 1 (Week 1): Pt will improve verbal expression to complete automatic sequences for ~75% accuracy with mod-max assist  SLP Short Term Goal 2 (Week 1): Pt will improve functional communication via any modality (verbalization, gestures, commnuication board) to indicate needs/wants to staff for ~75% accuracy with mod-max assist  SLP Short Term Goal 3 (Week 1): Pt will tolerate trials of upgraded solid consistencies with no overt s/s of aspiration with supervision cues.   SLP Short Term Goal 4 (Week 1): Pt will follow 2-step directives during functional tasks for 75% accuracy with mod assist.   See FIM for current functional status Refer to Care Plan for Long Term Goals  Recommendations for other services: Neuropsych  Discharge Criteria: Patient will be discharged from SLP if   patient  refuses treatment 3 consecutive times without medical reason, if treatment goals not met, if there is a change in medical status, if patient makes no progress towards goals or if patient is discharged from hospital.  The above assessment, treatment plan, treatment alternatives and goals were discussed and mutually agreed upon: No family available/patient unable  Nicole Page, M.A. CCC-SLP  Page, Nicole L 03/13/2014, 4:12 PM   

## 2014-03-13 NOTE — Evaluation (Signed)
Physical Therapy Assessment and Plan  Patient Details  Name: Traci Mclaughlin MRN: [id number] Date of Birth: 30-Mar-1951  PT Diagnosis: Abnormal posture, Abnormality of gait, Cognitive deficits, Coordination disorder, Difficulty walking, Hemiparesis dominant and Impaired cognition Rehab Potential: Good ELOS: 14-16 days    Today's Date: 03/13/2014 PT Individual Time: 7517-0017 PT Individual Time Calculation (min): 61 min    Problem List:  Patient Active Problem List   Diagnosis Date Noted  . Elevated troponin 03/11/2014  . 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 Right hemiplegia 03/07/2014  . Dyslipidemia 03/07/2014  . Stroke 03/05/2014  . CKD (chronic kidney disease) stage 3, GFR 30-59 ml/min 03/05/2014  . Hypertension 03/05/2014  . CVA (cerebral infarction) 03/05/2014  . Elevation of cardiac enzymes 07/06/2011  . Cervical cancer 07/06/2011  . Viral gastroenteritis 07/06/2011  . Acute kidney injury 07/06/2011    Past Medical History:  Past Medical History  Diagnosis Date  . Cancer     cervical; was on chemo; has been in remission for 51yr  . STEMI (ST elevation myocardial infarction) 06/2011  . Acute kidney injury 07/06/2011  . Hypertension   . HOH (hard of hearing)    Past Surgical History:  Past Surgical History  Procedure Laterality Date  . Cardiac catheterization  07/03/2011    Dr. VIrish Lack . Tee without cardioversion N/A 03/08/2014    Procedure: TRANSESOPHAGEAL ECHOCARDIOGRAM (TEE);  Surgeon: PFay Records MD;  [facility]  Service: Cardiovascular;  Laterality: N/A;    Assessment & Plan Clinical Impression: Patient is a 63y.o. year old female with with history of HTN, Hearing impaired, cervical cancer; who was admitted on 03/05/14 with right sided weakness, right facial droop and difficulty speaking X 1 day. Family was able to convince her to come to treat treatment and CT head revealed acute infarct superior left temporal lobe,  portions of the posterior left frontal lobe, left extreme capsule and insular cortex with cytotoxic edema. 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 2D echo with EF 55% with basal inferior and inferior lateral hypokinesis and mid to apical inferolateral akinesis. UDS positive for THC. Patient with elevated cardiac enzymes and [facility]  Patient transferred to CIR on 03/12/2014 .   Patient currently requires min with mobility (mod A for stairs) secondary to muscle weakness, decreased cardiorespiratoy endurance, decreased coordination and decreased motor planning and decreased attention, decreased awareness, decreased problem solving, decreased safety awareness, decreased memory and delayed processing.  Prior to hospitalization, patient was independent  with mobility and lived with Daughter in a House home.  Home access is 3-4 (Pt reports no stairs, will confirm with family)Stairs to enter.  Patient will benefit from skilled PT intervention to maximize safe functional mobility, minimize fall risk and decrease caregiver burden for planned discharge home with 24 hour supervision.  Anticipate patient [place].  PT - End of Session Activity Tolerance: Tolerates 30+ min activity with multiple rests Endurance Deficit: Yes Endurance Deficit Description: pt notably fatigued following gait and stairs.  PT Assessment Rehab Potential: Good PT Patient demonstrates impairments in the following area(s): Balance;Endurance;Motor;Safety PT Transfers Functional Problem(s): Bed Mobility;Bed to Chair;Car;Furniture;Floor PT Locomotion Functional Problem(s): Ambulation;Wheelchair Mobility;Stairs PT Plan PT Intensity: Minimum of 1-2 x/day ,45 to 90 minutes PT Frequency: 5 out of 7 days PT Duration Estimated Length of Stay: 14-16  days  PT  Treatment/Interventions: Ambulation/gait training;Balance/vestibular training;Cognitive remediation/compensation;Discharge planning;Disease management/prevention;DME/adaptive equipment instruction;Functional electrical stimulation;Functional mobility training;Neuromuscular re-education;Pain management;Patient/family education;Splinting/orthotics;Stair training;Therapeutic Activities;Therapeutic Exercise;UE/LE Strength taining/ROM;UE/LE Coordination activities;Wheelchair propulsion/positioning PT Transfers Anticipated Outcome(s): Supervision PT Locomotion Anticipated Outcome(s): Supervision PT Recommendation Follow Up Recommendations: Home health PT;24 hour supervision/assistance Patient destination: Home Equipment Recommended: To be determined  Skilled Therapeutic Intervention PT assessment and evaluation completed.  See full details below.  Initiated w/c mobility, gait training with and without device and stair negotiation.  Note that she does well with written instruction due to premorbid hearing deficits, however the higher level the functional task, the more difficult it was for pt to process through task.  Discussed ELOS, expected outcomes, rehab schedule, etc.  Pt verbalized understanding.  Left pt in w/c in room with quick release belt donned and all needs in reach.    PT Evaluation Precautions/Restrictions Precautions Precautions: Fall Precaution Comments: impulsivity with mobility, pain in L foot from ischemia, aphasic Restrictions Weight Bearing Restrictions: No General Chart Reviewed: Yes Family/Caregiver Present: No Vital SignsTherapy Vitals Temp: 98.4 F (36.9 C) Temp src: Oral Pulse Rate: 78 Resp: 18 BP: 157/81 mmHg Patient Position (if appropriate): Lying Oxygen Therapy SpO2: 98 % O2 Device: None (Room air) Pain Pain Assessment Pain Assessment: Faces Pain Score: 0-No pain Faces Pain Scale: Hurts little more Pain Type: Acute pain Pain Location: Foot Pain  Orientation: Left Pain Descriptors / Indicators: Aching Pain Intervention(s): Repositioned Home Living/Prior Functioning Home Living Available Help at Discharge: Family;Available 24 hours/day;Other (Comment) Type of Home: House Home Access: Stairs to enter [employer] of Steps: 3-4 (Pt reports no stairs, will confirm with family) Entrance Stairs-Rails: None Home Layout: Two level;Able to live on main level with bedroom/bathroom Additional Comments: No family present during PT eval, therefore all home enviromment and PLOF per pt report and info taken on previous session when family was available.   Lives With: Daughter Prior Function Level of Independence: Independent with basic ADLs;Independent with transfers;Independent with gait  Able to Take Stairs?: Yes Vision/Perception  Vision - Assessment Additional Comments: Did not formally test, however note when pt reading hand written notes, she was able to track R and L well during this functional task.   Cognition Overall Cognitive Status: Impaired/Different from baseline Arousal/Alertness: Awake/alert Orientation Level: Oriented to person;Oriented to place (was trying to say she had a stroke, but unable due to aphasia) Attention: Sustained;Selective Sustained Attention: Appears intact Selective Attention: Impaired Selective Attention Impairment: Functional basic Memory: Impaired Memory Impairment: Decreased recall of new information Awareness: Impaired Awareness Impairment: Intellectual impairment;Emergent impairment (seems to have emergent awareness of speech, not physical def. ) Behaviors: Impulsive;Poor frustration tolerance Safety/Judgment: Impaired Comments: Pt noted to be impulsive during standing tasks this morning and would attempt mobility prior to therapist being ready.  Sensation Sensation Light Touch: Appears Intact Stereognosis: Not tested Hot/Cold: Not tested Proprioception: Appears Intact Additional  Comments: Pt able to follow written instruction well for sensation/proprioception testing in LE.  Coordination Gross Motor Movements are Fluid and Coordinated: No Fine Motor Movements are Fluid and Coordinated: No Coordination and Movement Description: Pt initially with gross motor coordination, however note that as RLE fatigued, had increased difficulty with placement and control Motor  Motor Motor: Hemiplegia;Other (comment);Abnormal postural alignment and control Motor - Skilled Clinical Observations: Pt presents with R hemiplegia, decreased postural control with R lateral lean noted in standing.   Mobility Bed Mobility Bed Mobility: Supine to Sit Supine to Sit: 5: Supervision Supine to Sit Details: Visual cues/gestures for  sequencing;Verbal cues for sequencing;Verbal cues for technique;Verbal cues for precautions/safety Supine to Sit Details (indicate cue type and reason): Pt able to get to EOB with HOB flat and without rails to better simulate home Transfers Transfers: Yes Sit to Stand: 4: Min assist Sit to Stand Details: Verbal cues for sequencing;Verbal cues for technique;Manual facilitation for weight shifting Stand to Sit: 4: Min assist Stand to Sit Details (indicate cue type and reason): Verbal cues for sequencing;Verbal cues for technique;Verbal cues for precautions/safety;Manual facilitation for weight shifting Stand Pivot Transfers: 4: Min assist Stand Pivot Transfer Details: Verbal cues for sequencing;Verbal cues for technique;Visual cues/gestures for sequencing;Verbal cues for precautions/safety;Manual facilitation for weight shifting Locomotion  Ambulation Ambulation: Yes Ambulation/Gait Assistance: 4: Min assist Ambulation Distance (Feet): 90 Feet (x 2 reps then another 20 in room) Assistive device: 1 person hand held assist;Rolling walker Gait Gait: Yes Gait Pattern: Impaired Gait Pattern: Shuffle;Trunk rotated posteriorly on right;Lateral trunk lean to right;Trunk  flexed;Decreased stride length;Step-through pattern;Decreased stance time - right Stairs / Additional Locomotion Stairs: Yes Stairs Assistance: 3: Mod assist Stairs Assistance Details: Visual cues/gestures for sequencing;Verbal cues for sequencing;Verbal cues for technique;Verbal cues for precautions/safety;Manual facilitation for weight shifting;Verbal cues for safe use of DME/AE;Tactile cues for sequencing Stair Management Technique: Two rails;One rail Left;Alternating pattern;Forwards Number of Stairs: 5 Height of Stairs: 4 Architect: Yes Wheelchair Assistance: 3: Building surveyor Details: Tactile cues for initiation;Visual cues/gestures for sequencing;Verbal cues for sequencing;Verbal cues for technique;Verbal cues for precautions/safety;Verbal cues for safe use of DME/AE Wheelchair Propulsion: Left upper extremity;Right lower extremity Wheelchair Parts Management: Needs assistance Distance: 50  Trunk/Postural Assessment  Cervical Assessment Cervical Assessment: Within Functional Limits Thoracic Assessment Thoracic Assessment: Within Functional Limits Lumbar Assessment Lumbar Assessment: Exceptions to [facility] Lumbar Strength Overall Lumbar Strength Comments: Pt demonstrates posterior pelvic tilt in sitting with forward flexed posture in standing.  Postural Control Postural Control: Deficits on evaluation Postural Limitations: Pt tends to compensate for movements with posterior or R lateral lean.   Balance Balance Balance Assessed: Yes Static Sitting Balance Static Sitting - Balance Support: Feet supported;Left upper extremity supported Static Sitting - Level of Assistance: 5: Stand by assistance Dynamic Sitting Balance Dynamic Sitting - Balance Support: Feet supported;No upper extremity supported Dynamic Sitting - Level of Assistance: 5: Stand by assistance Static Standing Balance Static Standing - Balance Support: Left upper  extremity supported Static Standing - Level of Assistance: 4: Min assist Dynamic Standing Balance Dynamic Standing - Balance Support: During functional activity;Right upper extremity supported Dynamic Standing - Level of Assistance: 4: Min assist Dynamic Standing - Balance Activities: Lateral lean/weight shifting;Forward lean/weight shifting Extremity Assessment      RLE Assessment RLE Assessment: Exceptions to [facility] RLE Strength RLE Overall Strength: Deficits RLE Overall Strength Comments: Pt with 2+/5 hip flex, knee ext 4/5, knee flex 3+/5, ankle DF 4/5, PF 4/5 LLE Assessment LLE Assessment: Within Functional Limits (weak hip flexors)  FIM:  FIM - Bed/Chair Transfer Bed/Chair Transfer: 5: Supine > Sit: Supervision (verbal cues/safety issues);4: Bed > Chair or W/C: Min A (steadying Pt. > 75%) FIM - Locomotion: Wheelchair Distance: 50 Locomotion: Wheelchair: 2: Travels 50 - 149 ft with moderate assistance (Pt: 50 - 74%) FIM - Locomotion: Ambulation Locomotion: Ambulation Assistive Devices: Other (comment) (HHA) Ambulation/Gait Assistance: 4: Min assist Locomotion: Ambulation: 2: Travels 50 - 149 ft with minimal assistance (Pt.>75%) FIM - Locomotion: Stairs Locomotion: Scientist, physiological: Hand rail - 2 Locomotion: Stairs: 2: Up and Down 4 - 11 stairs  with moderate assistance (Pt: 50 - 74%)   Refer to Care Plan for Long Term Goals  Recommendations for other services: None  Discharge Criteria: Patient will be discharged from PT if patient refuses treatment 3 consecutive times without medical reason, if treatment goals not met, if there is a change in medical status, if patient makes no progress towards goals or if patient is discharged from hospital.  The above assessment, treatment plan, treatment alternatives and goals were discussed and mutually agreed upon: by patient  Denice Bors 03/13/2014, 10:00 AM

## 2014-03-13 NOTE — Evaluation (Signed)
Occupational Therapy Assessment and Plan  Patient Details  Name: Traci Mclaughlin MRN: [id number] Date of Birth: Jan 18, 1951  OT Diagnosis: abnormal posture, cognitive deficits, disturbance of vision, hemiplegia affecting dominant side and muscle weakness (generalized) Rehab Potential: Rehab Potential: Good ELOS: 14 days   Today's Date: 03/13/2014 OT Individual Time: 0370-4888 OT Individual Time Calculation (min): 61 min    Problem List:  Patient Active Problem List   Diagnosis Date Noted  . Elevated troponin 03/11/2014  . 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 Right hemiplegia 03/07/2014  . Dyslipidemia 03/07/2014  . Stroke 03/05/2014  . CKD (chronic kidney disease) stage 3, GFR 30-59 ml/min 03/05/2014  . Hypertension 03/05/2014  . CVA (cerebral infarction) 03/05/2014  . Elevation of cardiac enzymes 07/06/2011  . Cervical cancer 07/06/2011  . Viral gastroenteritis 07/06/2011  . Acute kidney injury 07/06/2011    Past Medical History:  Past Medical History  Diagnosis Date  . Cancer     cervical; was on chemo; has been in remission for 20yr  . STEMI (ST elevation myocardial infarction) 06/2011  . Acute kidney injury 07/06/2011  . Hypertension   . HOH (hard of hearing)   . CVA (cerebral infarction)    Past Surgical History:  Past Surgical History  Procedure Laterality Date  . Cardiac catheterization  07/03/2011    Dr. VIrish Lack . Tee without cardioversion N/A 03/08/2014    Procedure: TRANSESOPHAGEAL ECHOCARDIOGRAM (TEE);  Surgeon: PFay Records MD;  [facility]  Service: Cardiovascular;  Laterality: N/A;  . 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 female with history of HTN, HOH, cervical cancer; who was admitted on 03/05/14 with right sided weakness, right facial droop and difficulty speaking X 1 day. Family was able to convince her to  come to treatment and CT head revealed acute infarct superior left temporal lobe, portions of the posterior left frontal lobe, left extreme capsule and insular cortex with cytotoxic edema. 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 2D echo with EF 55% with basal inferior and inferior lateral hypokinesis and mid to apical inferolateral akinesis. Patient with elevated cardiac enzymes and cardiology felt changes due to [facility]. Carotid dopplers without significant ICA stenosis. 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 MD and rehab team recommending CIR for further therapies.  Patient to have loop recorder placed 03/12/14.  No further vascular surgery is planned at this time.  Patient transferred to CIR on 03/12/2014 .    Patient currently requires mod-max assist with basic self-care skills and IADL secondary to muscle weakness, decreased cardiorespiratoy endurance, decreased coordination and questionable decreased motor planning, decreased attention to right, decreased memory and delayed processing and decreased standing balance, decreased postural control, hemiplegia and decreased balance strategies.  Prior to hospitalization, per chart-patient could complete BADL without assistance.  Patient will benefit from skilled intervention to decrease level of assist with basic self-care skills and increase level of independence with iADL prior to discharge home with care partner.  Anticipate patient will require 24 hour supervision and follow up home health vs outpatient.  OT - End of Session Activity  Tolerance: Decreased this  session Endurance Deficit: Yes Endurance Deficit Description: pt notably fatigued following BADL eval.  OT Assessment Rehab Potential: Good Barriers to Discharge:  (need to confirm discharge caregiver and location) OT Patient demonstrates impairments in the following area(s): Balance;Cognition;Endurance;Motor;Pain;Perception;Safety OT Basic ADL's Functional Problem(s): Grooming;Eating;Bathing;Dressing;Toileting OT Advanced ADL's Functional Problem(s): Light Housekeeping OT Transfers Functional Problem(s): Toilet;Tub/Shower OT Additional Impairment(s): Fuctional Use of Upper Extremity OT Plan OT Intensity: Minimum of 1-2 x/day, 45 to 90 minutes OT Frequency: 5 out of 7 days OT Duration/Estimated Length of Stay: 14 days OT Treatment/Interventions: Balance/vestibular training;Cognitive remediation/compensation;DME/adaptive equipment instruction;Discharge planning;Functional mobility training;Neuromuscular re-education;Psychosocial support;Patient/family education;Pain management;Self Care/advanced ADL retraining;UE/LE Strength taining/ROM;Therapeutic Activities;UE/LE Coordination activities;Visual/perceptual remediation/compensation;Wheelchair propulsion/positioning OT Self Feeding Anticipated Outcome(s): Supervision OT Basic Self-Care Anticipated Outcome(s): Supervision OT Toileting Anticipated Outcome(s): Supervision OT Bathroom Transfers Anticipated Outcome(s): Supervision OT Recommendation Patient destination: Home (unclear whose home she will be discharging to ) Follow Up Recommendations: Home health OT Equipment Recommended: Tub/shower bench  Skilled Therapeutic Intervention OT eval and self care retraining to include shower.  Patient did not have clothes from home today.  Patient indicates that she had hearing impairment PTA however she reports that it is worse now.  She appears able to read written questions and instructions however functionally, she presents with right spatial and  body inattention and function is further complicated by aphasia.  OT Evaluation Precautions/Restrictions  Precautions Precautions: Fall Precaution Comments: impulsivity with mobility, pain in L foot from ischemia, aphasic, right inattention Restrictions Weight Bearing Restrictions: No Pain No indication of pain Home Living/Prior Functioning Home Living Available Help at Discharge: Family;Available 24 hours/day (per report) Type of Home: House Home Access: Stairs to enter [employer] of Steps: 3-4 (patient reports no stairs per PT notes, wil confirm with family) Entrance Stairs-Rails: None Home Layout: Two level;Able to live on main level with bedroom/bathroom Additional Comments: No family present during OT eval, therefore all home enviromment and PLOF per pt report and info documented by staff when family was available.   Lives With: Daughter IADL History Education: unknown secondary to expressive aphasia  Prior Function Level of Independence: Independent with basic ADLs;Independent with transfers;Independent with gait  Able to Take Stairs?: Yes Vocation: Retired (per report ) ADL See FIM for details Vision/Perception  Vision- History Baseline Vision/History: Wears glasses Wears Glasses: At all times Patient Visual Report: Other (comment) (patient able to indicate some change siince CVA yet unable to state changes) Vision- Assessment Vision Assessment?: Vision impaired- to be further tested in functional context Additional Comments: When reading handwritten notes, eyes track left and right on the page.  Cognition Overall Cognitive Status: Impaired/Different from baseline Arousal/Alertness: Awake/alert Orientation Level: Oriented to person;Oriented to place;Oriented to situation (with written visual aid and choice of three) Attention: Selective Selective Attention: Impaired Selective Attention Impairment: Verbal basic;Functional basic Memory: Impaired Memory  Impairment: Decreased recall of new information Awareness: Impaired Awareness Impairment: Emergent impairment Behaviors: Impulsive;Poor frustration tolerance Sensation Sensation Light Touch: Appears Intact Stereognosis: Not tested Hot/Cold: Not tested Proprioception: Not tested Additional Comments: Pt able to follow written instruction well for light touch testing in LE.  Coordination Gross Motor Movements are Fluid and Coordinated: No Fine Motor Movements are Fluid and Coordinated: No Motor  Motor Motor: Hemiplegia;Other (comment);Abnormal postural alignment and control Motor - Skilled Clinical Observations: Pt presents with R hemiplegia, decreased postural control with R lateral lean noted in standing. Attempts to use her RUE yet is most successful when she watches her hand/arm while it is working Psychologist, counselling  Transfers Sit to Stand Details: Manual facilitation for weight shifting;Visual cues/gestures for sequencing;Visual cues/gestures for precautions/safety Stand to Sit Details (indicate cue type and reason): Manual facilitation for weight shifting;Visual cues/gestures for precautions/safety;Visual cues/gestures for sequencing  Trunk/Postural Assessment  Cervical Assessment Cervical Assessment: Within Functional Limits Thoracic Assessment Thoracic Assessment: Within Functional Limits Lumbar Assessment Lumbar Assessment: Exceptions to [facility] Lumbar Strength Overall Lumbar Strength Comments: Pt demonstrates posterior pelvic tilt in sitting with forward flexed posture in standing.  Postural Control Postural Control: Deficits on evaluation Postural Limitations: Pt tends to compensate for movements with posterior or R lateral lean.   Balance Balance Balance Assessed: Yes Static Sitting Balance Static Sitting - Balance Support: Feet supported;Left upper extremity supported Static Sitting - Level of Assistance: 5: Stand by assistance Dynamic Sitting Balance Dynamic Sitting - Balance  Support: Feet supported;No upper extremity supported Dynamic Sitting - Level of Assistance: 5: Stand by assistance Static Standing Balance Static Standing - Balance Support: Left upper extremity supported Static Standing - Level of Assistance: 4: Min assist Dynamic Standing Balance Dynamic Standing - Balance Support: During functional activity;Right upper extremity supported Dynamic Standing - Level of Assistance: 4: Min assist Dynamic Standing - Balance Activities: Lateral lean/weight shifting;Forward lean/weight shifting Extremity/Trunk Assessment RUE Assessment RUE Assessment: Exceptions to [facility] RUE AROM (degrees) RUE Overall AROM Comments: Grossly WFL except end ranges of shoulder movements and first and second fingers with decreased flexion compared to third and forth fingers. RUE PROM (degrees) RUE Overall PROM Comments: WFL RUE Strength RUE Overall Strength Comments: Brunnstrom IV-V in arm and V-VI in hand LUE Assessment LUE Assessment: Within Functional Limits  FIM:  FIM - Grooming Grooming Steps: Wash, rinse, dry face Grooming: 2: Patient completes 1 of 4 or 2 of 5 steps FIM - Bathing Bathing Steps Patient Completed: Right Arm;Abdomen;Front perineal area;Buttocks Bathing: 2: Max-Patient completes 3-4 20f10 parts or 25-49% FIM - Upper Body Dressing/Undressing Upper body dressing/undressing: 0: Wears gown/pajamas-no public clothing FIM - TSystems developerDevices: Grab bars;Tub transfer bench;Walk in shower Tub/shower Transfers: 4-Into Tub/Shower: Min A (steadying Pt. > 75%/lift 1 leg);4-Out of Tub/Shower: Min A (steadying Pt. > 75%/lift 1 leg)   Refer to Care Plan for Long Term Goals  Recommendations for other services: None  Discharge Criteria: Patient will be discharged from OT if patient refuses treatment 3 consecutive times without medical reason, if treatment goals not met, if there is a change in medical status, if patient makes no progress  towards goals or if patient is discharged from hospital.  The above assessment, treatment plan, treatment alternatives and goals were discussed and mutually agreed upon: by patient  [place] C[place]8/18/2015, 5:12 PM

## 2014-03-14 ENCOUNTER — Inpatient Hospital Stay (HOSPITAL_COMMUNITY): Payer: Medicare Other | Admitting: Speech Pathology

## 2014-03-14 ENCOUNTER — Encounter (HOSPITAL_COMMUNITY): Payer: Medicare Other | Admitting: Occupational Therapy

## 2014-03-14 ENCOUNTER — Inpatient Hospital Stay (HOSPITAL_COMMUNITY): Payer: Medicare Other

## 2014-03-14 ENCOUNTER — Inpatient Hospital Stay (HOSPITAL_COMMUNITY): Payer: Medicare Other | Admitting: *Deleted

## 2014-03-14 DIAGNOSIS — N179 Acute kidney failure, unspecified: Secondary | ICD-10-CM

## 2014-03-14 DIAGNOSIS — I6330 Cerebral infarction due to thrombosis of unspecified cerebral artery: Secondary | ICD-10-CM

## 2014-03-14 DIAGNOSIS — Z5189 Encounter for other specified aftercare: Secondary | ICD-10-CM

## 2014-03-14 DIAGNOSIS — I999 Unspecified disorder of circulatory system: Secondary | ICD-10-CM

## 2014-03-14 MED ORDER — HYDROCODONE-ACETAMINOPHEN 5-325 MG PO TABS
2.0000 | ORAL_TABLET | Freq: Two times a day (BID) | ORAL | Status: DC
  Administered 2014-03-14 – 2014-03-17 (×6): 2 via ORAL
  Filled 2014-03-14 (×7): qty 2

## 2014-03-14 NOTE — Progress Notes (Addendum)
Physical Therapy Session Note  Patient Details  Name: Traci Mclaughlin MRN: [id number] Date of Birth: June 04, 1951  Today's Date: 03/14/2014 PT Individual Time: 1003-1048 PT Individual Time Calculation (min): 45 min   Short Term Goals: Week 1:  PT Short Term Goal 1 (Week 1): Pt will perform stand pivot transfer at S level w/ LRAD PT Short Term Goal 2 (Week 1): Pt will perform dynamic standing activities at S level x 5 mins to increase independence with ADL's.  PT Short Term Goal 3 (Week 1): Pt will ambulate x 100' w/ LRAD at min/guard level with min cues for safety.  PT Short Term Goal 4 (Week 1): Pt will perform w/c mobility using most effective manner (likely LUE, RLE) x 100' at min A level.   Skilled Therapeutic Interventions/Progress Updates:  Per Algis Liming, PA, pt is cleared for wt bearing LLE despite vascular deficiency. PT used white board to question pt about pain; she was unable rate, but indicated that LLE painful. Due to ongoing pain, ambulation distance may need to be decreased for LTGs.   W/c > mat squat pivot to L with min guard assist.  neuromuscular re-education via demo, manual cues for trunk shortening//lengthening with reaching out of BOS L and R ; R lateral leans for wt bearing   Before gait, PT questioned pt if she was dizzy; she said she was.  BP 154/136 HR 79.  Pt obviously in pain with LLE, weeping.  RN notified.  BP again 136/54, HR 102.  Mat> w/c squat pivot as above.  Attempted w/c propulsion but unable to propel with L foot, and R hand is ineffective in propulsion on wheel rim.  Returned to bed with mod assist for transfer; sit> supine with min assist for RLE.  Pain meds administered by RN; pt had not had any yet today.   Pt needs to have pain meds 30-60 min before tx starts; will discuss with MD.  PT returned to room after a couple of minutes to see if pt was able to do bedside RLE therex, but she was fast asleep already.    Therapy  Documentation Precautions:  Precautions Precautions: Fall Precaution Comments: impulsivity with mobility, pain in L foot from ischemia, aphasic, right inattention Restrictions Weight Bearing Restrictions: No General: PT Amount of Missed Time (min): 15 Minutes PT Missed Treatment Reason: Pain Vital Signs: Therapy Vitals BP: 136/54 mmHg; HR 102 Patient Position (if appropriate): Sitting Pain: Pain Assessment Pain Assessment: Faces Faces Pain Scale: Hurts little more Pain Type: Acute pain Pain Location: Foot Pain Orientation: Left Pain Onset: On-going     See FIM for current functional status  Therapy/Group: Individual Therapy  Jevin Camino 03/14/2014, 10:03 AM

## 2014-03-14 NOTE — Progress Notes (Signed)
Occupational Therapy Session Note  Patient Details  Name: Traci Mclaughlin MRN: [id number] Date of Birth: [birth year]  Today's Date: 03/14/2014 OT Individual Time: 0800-0900 OT Individual Time Calculation (min): 60 min   Short Term Goals: Week 1:  OT Short Term Goal 1 (Week 1): Grooming:  Min assist with 3 grooming tasks while standing at sink OT Short Term Goal 2 (Week 1): Self Feeding:  Min assist with AE PRN OT Short Term Goal 3 (Week 1): Bath: steady assist when standing portion of bathing OT Short Term Goal 4 (Week 1): LB Dressing: Min assist to include sit and stand OT Short Term Goal 5 (Week 1): Toileting: Steady assist while patient performs toileting tasks  Skilled Therapeutic Interventions/Progress Updates:    Pt seen for BADL retraining of B/D/G with a focus on R side attention, RUE forced use, balance, and activity tolerance. Pt received in bed and she was anxious about her L foot pain, but the pain did not prohibit her participation greatly. Pt ambulated to shower with min A HHA. She needed min cuing in shower to fully use R hand to wash under left arm. Min cues to adequately was certain body parts and to fully attend to R side. Pt responded well to tactile and visual cues as she has significant hearing loss.  Pt completed dressing with min A. Good use of RUE with attempting to pull underwear and pants up and with brushing her teeth.   Pt taken to the gym in her w/c to work on R hand coordination and strength with removing large pegs from board and stacking cones.  Pt continuing with activity until her next PT session at 9am.  Therapy Documentation Precautions:  Precautions Precautions: Fall Precaution Comments: impulsivity with mobility, pain in L foot from ischemia, aphasic, right inattention Restrictions Weight Bearing Restrictions: No   Pain: Pain Assessment Pain Assessment: Faces Faces Pain Scale: Hurts little more Pain Type: Acute pain Pain Location: Foot Pain  Orientation: Left Pain Onset: On-going ADL:  See FIM for current functional status  Therapy/Group: Individual Therapy  [place] 03/14/2014, 9:35 AM

## 2014-03-14 NOTE — Progress Notes (Signed)
Social Work Patient ID: Traci Mclaughlin, female   DOB: 02-24-1951, 63 y.o.   MRN: [id number] Met with [name]-daughter to inform her of team conference goals-supervision level and target discharge date of 9/1. She reports she is moving to [place] this weekend and kids will be starting school on Tuesday, so she will be able to be here only weekends.  Discussed discharge plan where is pt going at discharge, daughter wants pt to make the decision. Have left a message for pt's other daughter and will Come up with a plan.  [name] feels pt is doing well and becoming ornary which is close to her baseline and giving it to her.  Will try to get someone to commit to discharge plan of pt.  Await return call from [name].

## 2014-03-14 NOTE — Patient Care Conference (Signed)
Inpatient RehabilitationTeam Conference and Plan of Care Update Date: 03/14/2014   Time: 10;50 AM    Patient Name: Traci Mclaughlin      Medical Record Number: [id number]  Date of Birth: April 05, 1951 Sex: Female         Room/Bed: 4M07C/4M07C-01 Payor Info: Payor: [employer] MEDICARE / Plan: AARP MEDICARE COMPLETE / Product Type: *No Product type* /    Admitting Diagnosis: L CVA  Admit Date/Time:  03/12/2014  6:08 PM Admission Comments: No comment available   Primary Diagnosis:  <principal problem not specified> Principal Problem: <principal problem not specified>  Patient Active Problem List   Diagnosis Date Noted  . Elevated troponin 03/11/2014  . 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 Right hemiplegia 03/07/2014  . Dyslipidemia 03/07/2014  . Stroke 03/05/2014  . CKD (chronic kidney disease) stage 3, GFR 30-59 ml/min 03/05/2014  . Hypertension 03/05/2014  . CVA (cerebral infarction) 03/05/2014  . Elevation of cardiac enzymes 07/06/2011  . Cervical cancer 07/06/2011  . Viral gastroenteritis 07/06/2011  . Acute kidney injury 07/06/2011    Expected Discharge Date: Expected Discharge Date: 03/27/14  Team Members Present: Physician leading conference: Dr. 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  Maximize functional improvement for home discharge  Optimize communication   Bowel/Bladder   continent of bowel and bladder with urinary urgency; LBM 8/18 miralax daily; utilizes call bell for assistance to BR  remain contient; no accidents   monitor bowel pattern assist to BR swiftly to avoid accidents   Swallow/Nutrition/ Hydration   dys 2  textures, thin liquids  trials of advanced textures   Mod I with least restrictive diet    ADL's   min a overall with transfers, min self care, using R hand 25% of the time  supervision with basic self care and light house keeping  ADL retraining, balance activities, RUE neuro re-ed, perceptual activities, pt/family education   Mobility   min> mod assist dependening upon L toe pain, total assist for w/c propulsion, unable to ambulate today due to toe pain ( at eval 8/18, gait x 90' with HHA)  supervision transfers and gait x 25', min assist up/down 4 steps with 1 rail  transfers, gait , pt ed, balance, activity tolerance   Communication   Mod-Max assist for functional communication, benefits from use of written/visual cues   min assist   improve functional communication with staff members via any modality   Safety/Cognition/ Behavioral Observations  difficult to assess secondary to aphasia, decreased safety awareness per report, somewhat impulsive   supervision   improve awareness of safety precautions   Pain   intense L foot pain norco scheduled BID and prn Q4H  pain controlled with analgesics and relief provided  monitor pain periodically and administer analgesics as ordered   Skin   CDI  free from breakdown/infection  assess skin q shift      *See Care Plan and progress notes for long and short-term goals.  Barriers to Discharge: See above    Possible Resolutions to Barriers:  See above    Discharge Planning/Teaching Needs:  [name] coming up with a discharge plan-one of their homes.  Traci Mclaughlin recovering from TKR  Team Discussion:  Pain in foot limits her progress, ques darco sandal to keep weight off toe would help.  Trying to communicate with pt SP guiding team-try gesturing.  Schedule pain meds to assist with participation in therapies.  Neuro-psych to see  Revisions to Treatment Plan:  None   Continued Need for Acute Rehabilitation Level of Care: The patient requires  daily medical management by a physician with specialized training in physical medicine and rehabilitation for the following conditions: Daily direction of a multidisciplinary physical rehabilitation program to ensure safe treatment while eliciting the highest outcome that is of practical value to the patient.: Yes Daily medical management of patient stability for increased activity during participation in an intensive rehabilitation regime.: Yes Daily analysis of laboratory values and/or radiology reports with any subsequent need for medication adjustment of medical intervention for : Other;Neurological problems  Traci Mclaughlin 03/14/2014, 3:46 PM

## 2014-03-14 NOTE — Progress Notes (Signed)
Speech Language Pathology Daily Session Note  Patient Details  Name: Traci Mclaughlin MRN: [id number] Date of Birth: 07/10/1951  Today's Date: 03/14/2014 SLP Individual Time: 1300-1400 SLP Individual Time Calculation (min): 60 min  Short Term Goals: Week 1: SLP Short Term Goal 1 (Week 1): Pt will improve verbal expression to complete automatic sequences for ~75% accuracy with mod-max assist  SLP Short Term Goal 2 (Week 1): Pt will improve functional communication via any modality (verbalization, gestures, commnuication board) to indicate needs/wants to staff for ~75% accuracy with mod-max assist  SLP Short Term Goal 3 (Week 1): Pt will tolerate trials of upgraded solid consistencies with no overt s/s of aspiration with supervision cues.   SLP Short Term Goal 4 (Week 1): Pt will follow 2-step directives during functional tasks for 75% accuracy with mod assist.   Skilled Therapeutic Interventions:  Pt was seen for skilled speech therapy targeting expressive and receptive language goals.  Upon arrival, pt had complaints of left leg pain and was very tearful.  She stated that she had already had pain medication and was upset that no one was telling her why her leg was hurting.   Pt was motivated to participate in therapy and benefited from intermittent rest breaks for pain.  SLP facilitated the session with structured tasks targeting verbal expression in automatic sequences with pt able to count from 1-10 with 100% accuracy, ~60% accurate for reciting the days of the week with improvements most noted with mod assist phonemic placement cues.  SLP further facilitated the session with structured receptive naming tasks with pt able to match word to object from field of three with ~75% accuracy and min assist.  Pt continues to exhibit significant difficulty with confrontational naming tasks and is most accurate with basic 1 syllable words when provided with a visual aid.  SLP also initiated trials of skilled  apraxia drills with pt able to produce vowel repetitions with min assist, and CV syllable repetitions with mod faded to min assist.  Continue per current plan of care.    FIM:  Comprehension Comprehension Mode: Visual Comprehension: 4-Understands basic 75 - 89% of the time/requires cueing 10 - 24% of the time Expression Expression Mode: Verbal;Nonverbal Expression: 4-Expresses basic 75 - 89% of the time/requires cueing 10 - 24% of the time. Needs helper to occlude trach/needs to repeat words. Social Interaction Social Interaction: 4-Interacts appropriately 75 - 89% of the time - Needs redirection for appropriate language or to initiate interaction. Problem Solving Problem Solving: 3-Solves basic 50 - 74% of the time/requires cueing 25 - 49% of the time Memory Memory: 3-Recognizes or recalls 50 - 74% of the time/requires cueing 25 - 49% of the time  Pain Pain Assessment Pain Assessment: Faces Pain Score: 4  Faces Pain Scale: Hurts little more Pain Type: Acute pain Pain Location: Leg Pain Orientation: Left Pain Onset: On-going Pain Intervention(s): Repositioned Multiple Pain Sites: No  Therapy/Group: Individual Therapy  Windell Moulding, M.A. CCC-SLP  Elcie Pelster, Selinda Orion 03/14/2014, 4:43 PM

## 2014-03-14 NOTE — Progress Notes (Signed)
Social Work Assessment and Plan Social Work Assessment and Plan  Patient Details  Name: Traci Mclaughlin MRN: [id number] Date of Birth: Jun 24, 1951  Today's Date: 03/14/2014  Problem List:  Patient Active Problem List   Diagnosis Date Noted  . Elevated troponin 03/11/2014  . 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 Right hemiplegia 03/07/2014  . Dyslipidemia 03/07/2014  . Stroke 03/05/2014  . CKD (chronic kidney disease) stage 3, GFR 30-59 ml/min 03/05/2014  . Hypertension 03/05/2014  . CVA (cerebral infarction) 03/05/2014  . Elevation of cardiac enzymes 07/06/2011  . Cervical cancer 07/06/2011  . Viral gastroenteritis 07/06/2011  . Acute kidney injury 07/06/2011   Past Medical History:  Past Medical History  Diagnosis Date  . Cancer     cervical; was on chemo; has been in remission for 1yrs  . STEMI (ST elevation myocardial infarction) 06/2011  . Acute kidney injury 07/06/2011  . Hypertension   . HOH (hard of hearing)   . CVA (cerebral infarction)    Past Surgical History:  Past Surgical History  Procedure Laterality Date  . Cardiac catheterization  07/03/2011    Dr. Irish Lack  . Tee without cardioversion N/A 03/08/2014    Procedure: TRANSESOPHAGEAL ECHOCARDIOGRAM (TEE);  Surgeon: Fay Records, MD;  Location: [facility];  Service: Cardiovascular;  Laterality: N/A;  . 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 Her smoking use included Cigarettes. She has a 1 pack-year smoking history. She quit smokeless tobacco use about 2 years ago. She reports that she drinks alcohol. She reports that she does not use illicit drugs.  Family / Support Systems Marital Status: Single Patient Roles: Parent Children: Mayer Camel  [phone] Other Supports: [name]-daughter  [phone] Anticipated Caregiver: Unsure at this  time Ability/Limitations of Caregiver: [name] recently had a TKR and is home recovering, can not provide mcuh physical assist Caregiver Availability: Other (Comment) (family coming up with a plan-want pt to decide) Family Dynamics: Pt is close with both daughter's-she was living with [name] but had just received approval for section 8 apartment and was to move in when this happened.  Two daughter's do not get along and rarely speak to one another.  Social History Preferred language: English Religion: None Cultural Background: No issues Education: High School Read: Yes Write: Yes Employment Status: Disabled Date Retired/Disabled/Unemployed: 2010 Freight forwarder Issues: No issues Guardian/Conservator: None-according to MD pt is not capable of mkaing her own decisions at this time,. Will rely upon both daughter's input, since no formal POA   Abuse/Neglect Physical Abuse: Denies Verbal Abuse: Denies Sexual Abuse: Denies Exploitation of patient/patient's resources: Denies Self-Neglect: Denies  Emotional Status Pt's affect, behavior adn adjustment status: Pt has been crying due to frustration and inability to communicate, can read some written words.  Has been extremely HOH for years.  Daughter reports: " She reads lips and is used to [place], but havng difficulty with yall."  Difficult to assess pt due to aphasia and hearing deficits. Trying to gesture as a way to communicate. Recent Psychosocial Issues: Other medical issues-hearing, pain in left foot which interferes with therapy.  MD to follow up upon discharge with foot-monitoring here Pyschiatric History: No history-can not assess due to communication issues-seems pt is aware of her stroke and frustrated and depressed due to her deficits and pain issues.  Will ask Neuor-psych to see, to assist [place]. Substance  Abuse History: Quit tobacco years ago  Patient / Family Perceptions, Expectations & Goals Pt/Family understanding of  illness & functional limitations: Daughter reports: " Mom knows she had a stroke and is starting to get ornary, which is good."  Daughter seems to have a basic understanding of [name] condition.  She had many questions regarding her leg which worker directed her to RN or MD.  Pt moving well from her stroke but has pain issues and may need more surgery later down the road after recover from CVA. 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, Geophysicist/field seismologist, retiree, etc Anticipated changes in roles/activities/participation: resume Pt/family expectations/goals: Daughter states: " I can do what she needs she would need to come down to [place] with me."  " Mom needs to make the decision of where she wants to go."  [employer]: Other (Comment) (Recieved approval for section 8 housing) Premorbid Home Care/DME Agencies: None Transportation available at discharge: Used the bus-but family can assist Resource referrals recommended: Support group (specify) (CVA Support Group)  Discharge Planning Living Arrangements: [place]: Children;Other relatives;Friends/neighbors;Church/faith community Type of Residence: Private residence Insurance Resources: Multimedia programmer (specify) Primary school teacher) Financial Resources: SSD Financial Screen Referred: Yes Living Expenses: Lives with family Money Management: Patient Does the patient have any problems obtaining your medications?: No Home Management: She and daughter Patient/Family Preliminary Plans: Marena Chancy will need 24 hr supervision, was living with daughter prior to admission.  ONe daughter moving to [place] this weekend and other is recovering from TKR.  Will disucss with both the plan when pt leaves rehab.  She will need to follow up with MD regarding her leg. Social Work Anticipated Follow Up Needs: HH/OP;Support Group  Clinical Impression Supportive family who are willing to assist pt, but want her to make the  decision.  Difficult to assess pt who appears frustrated with communication issue and deficits from her stroke. This is compounded by the severe pain she is in with her foot, which may need surgery once she recovers from this stroke.  Working with SP on best way to communicate with pt and will coordinate with daughter's and come up with a safe discharge plan.  [name] 03/14/2014, 3:43 PM

## 2014-03-14 NOTE — Progress Notes (Signed)
63 y.o. female with history of HTN, Hearing impaired, cervical cancer; who was admitted on 03/05/14 with right sided weakness, right facial droop and difficulty speaking X 1 day. Family was able to convince her to come to treat treatment and CT head revealed acute infarct superior left temporal lobe, portions of the posterior left frontal lobe, left extreme capsule and insular cortex with cytotoxic edema. 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 2D echo with EF 55% with basal inferior and inferior lateral hypokinesis and mid to apical inferolateral akinesis. UDS positive for THC. Patient with elevated cardiac enzymes and cardiology felt changes due to [facility] stroke. Carotid dopplers without significant ICA stenosis. 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 Dr. Rayann Heman consulted for input and TEE done revealing normal aorta, no thrombus or PFO and mildly depressed LVEF. Loop recorder placed on 03/12/14   Dr. Scot Dock consulted for input on chronic foot pain as well as ischemic left 5th toe as well as some cyanosis along lateral aspect of her foot. Patient with markedly diminshed flow left foot as well as difficulty palpating left femoral vein. Arteriogram done today showing total occlusion of left common iliac artery with collaterals and did not appear acute  Subjective/Complaints: "feel weak"  Review of Systems - limited by hearing impairment and aphasia  Objective: Vital Signs: Blood pressure 159/89, pulse 73, temperature 98.6 F (37 C), temperature source Oral, resp. rate 18, weight 89.8 kg (197 lb 15.6 oz), SpO2 98.00%. No results found. Results for orders placed during the hospital encounter of 03/12/14 (from the past 72 hour(s))  URINALYSIS, ROUTINE W REFLEX MICROSCOPIC     Status: Abnormal   Collection Time    03/12/14 10:05 PM      Result Value Ref  Range   Color, Urine AMBER (*) YELLOW   Comment: BIOCHEMICALS MAY BE AFFECTED BY COLOR   APPearance CLEAR  CLEAR   Specific Gravity, Urine 1.025  1.005 - 1.030   pH 5.0  5.0 - 8.0   Glucose, UA NEGATIVE  NEGATIVE mg/dL   Hgb urine dipstick NEGATIVE  NEGATIVE   Bilirubin Urine SMALL (*) NEGATIVE   Ketones, ur NEGATIVE  NEGATIVE mg/dL   Protein, ur NEGATIVE  NEGATIVE mg/dL   Urobilinogen, UA 1.0  0.0 - 1.0 mg/dL   Nitrite NEGATIVE  NEGATIVE   Leukocytes, UA TRACE (*) NEGATIVE  URINE CULTURE     Status: None   Collection Time    03/12/14 10:05 PM      Result Value Ref Range   Specimen Description URINE, CLEAN CATCH     Special Requests NONE     Culture  Setup Time       Value: 03/12/2014 22:46     Performed at [facility]       Value: >=100,000 COLONIES/ML     Performed at [facility]   Culture       Value: [place]     Performed at [facility]   Report Status PENDING    URINE MICROSCOPIC-ADD ON     Status: Abnormal   Collection Time    03/12/14 10:05 PM      Result Value Ref Range   Squamous Epithelial / LPF FEW (*) RARE   WBC, UA 3-6  <3 WBC/hpf   RBC / HPF 0-2  <3 RBC/hpf   Bacteria, UA FEW (*)  RARE   Casts HYALINE CASTS (*) NEGATIVE   Urine-Other MUCOUS PRESENT    CBC WITH DIFFERENTIAL     Status: None   Collection Time    03/13/14  5:19 AM      Result Value Ref Range   WBC 8.1  4.0 - 10.5 K/uL   RBC 4.17  3.87 - 5.11 MIL/uL   Hemoglobin 13.8  12.0 - 15.0 g/dL   HCT 40.1  36.0 - 46.0 %   MCV 96.2  78.0 - 100.0 fL   MCH 33.1  26.0 - 34.0 pg   MCHC 34.4  30.0 - 36.0 g/dL   RDW 12.6  11.5 - 15.5 %   Platelets 248  150 - 400 K/uL   Neutrophils Relative % 59  43 - 77 %   Neutro Abs 4.9  1.7 - 7.7 K/uL   Lymphocytes Relative 28  12 - 46 %   Lymphs Abs 2.2  0.7 - 4.0 K/uL   Monocytes Relative 10  3 - 12 %   Monocytes Absolute 0.8  0.1 - 1.0 K/uL   Eosinophils Relative 3  0 - 5 %   Eosinophils Absolute 0.2  0.0 -  0.7 K/uL   Basophils Relative 0  0 - 1 %   Basophils Absolute 0.0  0.0 - 0.1 K/uL  COMPREHENSIVE METABOLIC PANEL     Status: Abnormal   Collection Time    03/13/14  5:19 AM      Result Value Ref Range   Sodium 139  137 - 147 mEq/L   Potassium 4.2  3.7 - 5.3 mEq/L   Chloride 102  96 - 112 mEq/L   CO2 22  19 - 32 mEq/L   Glucose, Bld 92  70 - 99 mg/dL   BUN 19  6 - 23 mg/dL   Creatinine, Ser 1.20 (*) 0.50 - 1.10 mg/dL   Calcium 9.4  8.4 - 10.5 mg/dL   Total Protein 7.2  6.0 - 8.3 g/dL   Albumin 3.3 (*) 3.5 - 5.2 g/dL   AST 27  0 - 37 U/L   ALT 17  0 - 35 U/L   Alkaline Phosphatase 58  39 - 117 U/L   Total Bilirubin 0.5  0.3 - 1.2 mg/dL   GFR calc non Af Amer 47 (*) >90 mL/min   GFR calc Af Amer 55 (*) >90 mL/min   Comment: (NOTE)     The eGFR has been calculated using the CKD EPI equation.     This calculation has not been validated in all clinical situations.     eGFR's persistently <90 mL/min signify possible Chronic Kidney     Disease.   Anion gap 15  5 - 15     HEENT: Hearing impaired Cardio: RRR and no murmurs Resp: CTA B/L and unlabored GI: BS positive and NT,ND Extremity:  Pulses positive and No Edema Skin:   Intact and Other cyanosis left 5th toe Neuro: Alert/Oriented and Abnormal Motor 4/5 on R side, 5/5 on left with exception of left toe flex/ext limited by pain Musc/Skel:  Normal and Extremity tender left 5th toe Gen NAD   Assessment/Plan: 1. Functional deficits secondary to Left MCA infarct which require 3+ hours per day of interdisciplinary therapy in a comprehensive inpatient rehab setting. Physiatrist is providing close team supervision and 24 hour management of active medical problems listed below. Physiatrist and rehab team continue to assess barriers to discharge/monitor patient progress toward functional and medical goals. Team conference  today please see physician documentation under team conference tab, met with team face-to-face to discuss  problems,progress, and goals. Formulized individual treatment plan based on medical history, underlying problem and comorbidities. FIM: FIM - Bathing Bathing Steps Patient Completed: Right Arm;Abdomen;Front perineal area;Buttocks Bathing: 2: Max-Patient completes 3-4 80f10 parts or 25-49%  FIM - Upper Body Dressing/Undressing Upper body dressing/undressing: 0: Wears gown/pajamas-no public clothing FIM - Lower Body Dressing/Undressing Lower body dressing/undressing: 0: Wears gInterior and spatial designer    FIM - TRadio producerDevices: Grab bars Toilet Transfers: 4-To toilet/BSC: Min A (steadying Pt. > 75%);4-From toilet/BSC: Min A (steadying Pt. > 75%)  FIM - Bed/Chair Transfer Bed/Chair Transfer: 5: Supine > Sit: Supervision (verbal cues/safety issues);4: Bed > Chair or W/C: Min A (steadying Pt. > 75%)  FIM - Locomotion: Wheelchair Distance: 50 Locomotion: Wheelchair: 2: Travels 50 - 149 ft with moderate assistance (Pt: 50 - 74%) FIM - Locomotion: Ambulation Locomotion: Ambulation Assistive Devices: Other (comment) (HHA) Ambulation/Gait Assistance: 4: Min assist Locomotion: Ambulation: 2: Travels 50 - 149 ft with minimal assistance (Pt.>75%)  Comprehension Comprehension Mode: Visual (hearing impaired PTA yet patient incates that it is worse.) Comprehension: 4-Understands basic 75 - 89% of the time/requires cueing 10 - 24% of the time  Expression Expression Mode: Verbal;Nonverbal Expression Assistive Devices: 6-Communication board Expression: 5-Expresses basic needs/ideas: With extra time/assistive device  Social Interaction Social Interaction: 5-Interacts appropriately 90% of the time - Needs monitoring or encouragement for participation or interaction.  Problem Solving Problem Solving: 3-Solves basic 50 - 74% of the time/requires cueing 25 - 49% of the time  Memory Memory: 3-Recognizes or recalls 50 - 74% of the time/requires cueing 25 -  49% of the time (difficult to assess due to aphasia and hearing impairment)  Medical Problem List and Plan:   1. Functional deficits secondary to L-MCA infarct with right hemiparesis, aphasia as well as ischemia left foot.   2. DVT Prophylaxis/Anticoagulation: Pharmaceutical: Lovenox   3. Pain Management: Indicating severe pain left foot. Will continue hydrocodone schedule bid as well as prn. Patient unable to ask for medication.   4. Mood: Labile and anxious in part due to pain as well as communication deficits. Will have LCSW follow for evaluation and support.   5. Neuropsych: This patient is not quite yet capable of making decisions on her own behalf.   6. Skin/Wound Care: Pressure relief measures.   7. Chronic left iliac artery occlusion: Will order PRAFO for prevention of breakdown. Continue prn medications for pain control.               -pain will be an issue with weight bearing/activities with therapy.   8. Hypokalemia: Will check follow up labs in am.  9.  UA - , Cult >100K proteus, probable colonization will not tx unless fever or symptomatic  LOS (Days) 2 A FACE TO FACE EVALUATION WAS PERFORMED  KIRSTEINS,ANDREW E 03/14/2014, 7:27 AM

## 2014-03-15 ENCOUNTER — Inpatient Hospital Stay (HOSPITAL_COMMUNITY): Payer: Medicare Other | Admitting: Speech Pathology

## 2014-03-15 ENCOUNTER — Inpatient Hospital Stay (HOSPITAL_COMMUNITY): Payer: Medicare Other

## 2014-03-15 ENCOUNTER — Encounter (HOSPITAL_COMMUNITY): Payer: Medicare Other

## 2014-03-15 ENCOUNTER — Encounter (HOSPITAL_COMMUNITY): Payer: Medicare Other | Admitting: Occupational Therapy

## 2014-03-15 DIAGNOSIS — I6330 Cerebral infarction due to thrombosis of unspecified cerebral artery: Secondary | ICD-10-CM

## 2014-03-15 DIAGNOSIS — I999 Unspecified disorder of circulatory system: Secondary | ICD-10-CM

## 2014-03-15 DIAGNOSIS — Z5189 Encounter for other specified aftercare: Secondary | ICD-10-CM

## 2014-03-15 DIAGNOSIS — N179 Acute kidney failure, unspecified: Secondary | ICD-10-CM

## 2014-03-15 LAB — URINE CULTURE: Colony Count: >=100000

## 2014-03-15 NOTE — Progress Notes (Signed)
Physical Therapy Session Note  Patient Details  Name: Traci Mclaughlin MRN: [id number] Date of Birth: 1951/03/23  Today's Date: 03/15/2014 PT Individual Time: 1300-1400 PT Individual Time Calculation (min): 60 min   Short Term Goals: Week 1:  PT Short Term Goal 1 (Week 1): Pt will perform stand pivot transfer at S level w/ LRAD PT Short Term Goal 2 (Week 1): Pt will perform dynamic standing activities at S level x 5 mins to increase independence with ADL's.  PT Short Term Goal 3 (Week 1): Pt will ambulate x 100' w/ LRAD at min/guard level with min cues for safety.  PT Short Term Goal 4 (Week 1): Pt will perform w/c mobility using most effective manner (likely LUE, RLE) x 100' at min A level.   Skilled Therapeutic Interventions/Progress Updates:  Pt obviously in pain due to L foot ischemia, but unable to rate. RN stated that pt refused offers of pain meds, x 3.  Pt verbalized "how long?" her L foot was going to hurt.  Therapist wrote short explanation of circulation problems L foot, and indicated she would see a different MD about her L foot after d/c. Pt read the note and appeared to understand it, but at end of session, she did not remember , and again questioned why her L foot hurt so much. Toes appear slightly red; 5th toe is most painful, per pt.  Bed mobility and transfer with close supervision.  L elevating legrest trialed to w/c to address foot pain; no improvement and pt was agreeable to use regular footrest again..  Gait training with RW x 150' on level tile, min guard> supervision at pt's insistence. No LOB, although gait is antalgic favoring LLE.  Up/down curb with Rw, min guard assist, visual cues for sequencing Rw.  Pt is able to lead with either foot.  Up/down 5 steps 2 rails with min guard assist, cues to use R hand.  When descending, R hand "stuck" then released quickly on railing- pt was unaware.  Simulated car transfer with min assist for cues.  Pt impulsive and unsafe with  hand placement.  Activity tolerance on NuStep at level 4 x 5 minutes, tactile cues to use R hand.  Gait returning to room x 160' with Rw, close supervision.  Transferred to bed with tactile cues for safe hand placement. L foot elevated, bed alarm set and all needs within reach.    Therapy Documentation Precautions:  Precautions Precautions: Fall Precaution Comments: impulsivity with mobility, pain in L foot from ischemia, aphasic, right inattention Restrictions Weight Bearing Restrictions: No   Pain: Pain Assessment Faces Pain Scale: Hurts whole lot Pain Type: Acute pain Pain Location: Leg Pain Orientation: Left Pain Descriptors / Indicators: Aching;Crying Pain Onset: On-going Pain Intervention(s): RN made aware;Repositioned;Distraction      See FIM for current functional status  Therapy/Group: Individual Therapy  Mavery Milling 03/15/2014, 3:24 PM

## 2014-03-15 NOTE — Progress Notes (Signed)
Speech Language Pathology Daily Session Note  Patient Details  Name: Traci Mclaughlin MRN: [id number] Date of Birth: 04/09/1951  Today's Date: 03/15/2014 SLP Individual Time: 0901-1001 SLP Individual Time Calculation (min): 60 min  Short Term Goals: Week 1: SLP Short Term Goal 1 (Week 1): Pt will improve verbal expression to complete automatic sequences for ~75% accuracy with mod-max assist  SLP Short Term Goal 2 (Week 1): Pt will improve functional communication via any modality (verbalization, gestures, commnuication board) to indicate needs/wants to staff for ~75% accuracy with mod-max assist  SLP Short Term Goal 3 (Week 1): Pt will tolerate trials of upgraded solid consistencies with no overt s/s of aspiration with supervision cues.   SLP Short Term Goal 4 (Week 1): Pt will follow 2-step directives during functional tasks for 75% accuracy with mod assist.   Skilled Therapeutic Interventions:  Pt was seen for skilled speech therapy targeting cognitive-linguistic goals.  Upon arrival, pt was asleep in bed with lights off, arousable to voice and light touch.  Pt was agreeable to participate in structured therapy activities with light encouragement.  Pt ambulated to bathroom with hand held assistance; however, once in bathroom, pt attempted to take off her shirt and indicated that she wanted to take a shower. Pt benefited from education related to daily therapy schedule where OT bathing and dressing session followed ST session, including ST goals versus OT goals.  Pt also benefited from supervision/set up to complete hand hygiene and independently requested to brush her teeth with gestural cues.  Pt was ~50-75% accurate for confrontational naming of pictures with written visual aids, although pt was noted to spontaneously name 3/15 pictures without the use of written aids.  Additionally, pt was 100% accurate for producing functional phrases from a written model with intermittent supervision cues for  phonemic placement.  Pt matched phrase to picture for >75% accuracy with min assist-supervision cues.  Pt with improved affect and frustration tolerance today in comparison to previous therapy sessions. Continue per current plan of care.    FIM:  Comprehension Comprehension Mode: Auditory Comprehension: 5-Understands basic 90% of the time/requires cueing < 10% of the time Expression Expression Mode: Verbal Expression: 3-Expresses basic 50 - 74% of the time/requires cueing 25 - 50% of the time. Needs to repeat parts of sentences. Social Interaction Social Interaction: 5-Interacts appropriately 90% of the time - Needs monitoring or encouragement for participation or interaction. Problem Solving Problem Solving: 4-Solves basic 75 - 89% of the time/requires cueing 10 - 24% of the time Memory Memory: 3-Recognizes or recalls 50 - 74% of the time/requires cueing 25 - 49% of the time  Pain Pain Assessment Pain Assessment: No/denies pain  Therapy/Group: Individual Therapy  Windell Moulding, M.A. CCC-SLP  Garrin Kirwan, [name] 03/15/2014, 11:40 AM

## 2014-03-15 NOTE — IPOC Note (Signed)
Overall Plan of Care [facility]) Patient Details Name: Traci Mclaughlin MRN: [id number] DOB: 27-Aug-1950  Admitting Diagnosis: L CVA  Hospital Problems: Active Problems:   CVA (cerebral infarction)     Functional Problem List: Nursing Edema;Endurance;Medication Management;Motor;Perception;Safety;Sensory;Skin Integrity;Pain;Bowel;Bladder  PT Balance;Endurance;Motor;Safety  OT Balance;Cognition;Endurance;Motor;Pain;Perception;Safety  SLP Nutrition;Cognition  TR         Basic ADL's: OT Grooming;Eating;Bathing;Dressing;Toileting     Advanced  ADL's: OT Light Housekeeping     Transfers: PT Bed Mobility;Bed to Chair;Car;Furniture;Floor  OT Toilet;Tub/Shower     Locomotion: PT Ambulation;Wheelchair Mobility;Stairs     Additional Impairments: OT Fuctional Use of Upper Extremity  SLP Swallowing;Communication;Social Cognition comprehension;expression Problem Solving;Memory  TR      Anticipated Outcomes Item Anticipated Outcome  Self Feeding Supervision  Swallowing  Mod I    Basic self-care  Supervision  Toileting  Supervision   Bathroom Transfers Supervision  Bowel/Bladder  min assist  Transfers  Supervision  Locomotion  Supervision  Communication  Min assist   Cognition  Supervision   Pain  < 4  Safety/Judgment  supervision   Therapy Plan: PT Intensity: Minimum of 1-2 x/day ,45 to 90 minutes PT Frequency: 5 out of 7 days PT Duration Estimated Length of Stay: 14-16 days  OT Intensity: Minimum of 1-2 x/day, 45 to 90 minutes OT Frequency: 5 out of 7 days OT Duration/Estimated Length of Stay: 14 days SLP Intensity: Minumum of 1-2 x/day, 30 to 90 minutes SLP Frequency: 5 out of 7 days SLP Duration/Estimated Length of Stay: 14-16 days        Team Interventions: Nursing Interventions Patient/Family Education;Bowel Management;Bladder Management;Pain Management;Disease Management/Prevention;Medication Management;Skin Care/Wound Management;Cognitive  Remediation/Compensation;Dysphagia/Aspiration Precaution Training;Discharge Planning;Psychosocial Support  PT interventions Ambulation/gait training;Balance/vestibular training;Cognitive remediation/compensation;Discharge planning;Disease management/prevention;DME/adaptive equipment instruction;Functional electrical stimulation;Functional mobility training;Neuromuscular re-education;Pain management;Patient/family education;Splinting/orthotics;Stair training;Therapeutic Activities;Therapeutic Exercise;UE/LE Strength taining/ROM;UE/LE Coordination activities;Wheelchair propulsion/positioning  OT Interventions Balance/vestibular training;Cognitive Paramedic;Discharge planning;Functional mobility training;Neuromuscular re-education;Psychosocial support;Patient/family education;Pain management;Self Care/advanced ADL retraining;UE/LE Strength taining/ROM;Therapeutic Activities;UE/LE Coordination activities;Visual/perceptual remediation/compensation;Wheelchair propulsion/positioning  SLP Interventions Patient/family education;Functional tasks;Dysphagia/aspiration precaution training;Cognitive remediation/compensation;Cueing hierarchy;Environmental controls;Internal/external aids;Multimodal communication approach;Speech/Language facilitation  TR Interventions    SW/CM Interventions Discharge Planning;Psychosocial Support;Patient/Family Education    Team Discharge Planning: Destination: PT-Home ,OT- Home (unclear whose home she will be discharging to ) , SLP-Home Projected Follow-up: PT-Home health PT;24 hour supervision/assistance, OT-  Home health OT, SLP-Home Health SLP;24 hour supervision/assistance Projected Equipment Needs: PT-To be determined, OT- Tub/shower bench, SLP-None recommended by SLP Equipment Details: PT- , OT-  Patient/family involved in discharge planning: PT- Patient,  OT-Patient unable/family or caregiver not available, SLP-Patient  unable/family or caregive not available  MD ELOS: 7-8d Medical Rehab Prognosis:  Excellent Assessment: 63 y.o. female with history of HTN, Hearing impaired, cervical cancer; who was admitted on 03/05/14 with right sided weakness, right facial droop and difficulty speaking X 1 day. Family was able to convince her to come to treat treatment and CT head revealed acute infarct superior left temporal lobe, portions of the posterior left frontal lobe, left extreme capsule and insular cortex with cytotoxic edema. 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 2D echo with EF 55% with basal inferior and inferior lateral hypokinesis and mid to apical inferolateral akinesis. UDS positive for THC. Patient with elevated cardiac enzymes and cardiology felt changes due to [facility] stroke  Now requiring 24/7 Rehab RN,MD, as well as CIR level PT, OT and SLP.  Treatment team will focus on ADLs and mobility with goals  set at Sup    See Team Conference Notes for weekly updates to the plan of care

## 2014-03-15 NOTE — Progress Notes (Signed)
Recreational Therapy Session Note  Patient Details  Name: Traci Mclaughlin MRN: [id number] Date of Birth: 06-04-1951 Today's Date: 03/15/2014  LATE ENTRY from 03/14/2014 Met with pt & daughter [name] to discuss TR services.  Pt not appropriate at this time, will continue to monitor through team for future participation.  [place] 03/15/2014, 8:08 AM

## 2014-03-15 NOTE — Progress Notes (Signed)
Occupational Therapy Session Note  Patient Details  Name: Traci Mclaughlin MRN: [id number] Date of Birth: [birth year]  Today's Date: 03/15/2014 OT Individual Time: 1020-1120 OT Individual Time Calculation (min): 60 min   Short Term Goals: Week 1:  OT Short Term Goal 1 (Week 1): Grooming:  Min assist with 3 grooming tasks while standing at sink OT Short Term Goal 2 (Week 1): Self Feeding:  Min assist with AE PRN OT Short Term Goal 3 (Week 1): Bath: steady assist when standing portion of bathing OT Short Term Goal 4 (Week 1): LB Dressing: Min assist to include sit and stand OT Short Term Goal 5 (Week 1): Toileting: Steady assist while patient performs toileting tasks  Skilled Therapeutic Interventions/Progress Updates:      Pt seen for BADL retraining of bathing and dressing with a focus on use of R side, R side awareness, activity tolerance, and balance.  Pt was crying at start of session stating she was frustrated about her L leg pain.  She was able to tolerate participating despite her pain. Pt only needed Min A with mobility and her self care this am.  Pt is using her R hand more actively to pull her pants up and to wash her body.  Pt completed self care and then went to gym to work on RUE strengthening. Pt transferred to mat and completed:  1# dowel shoulder presses, chest presses, wrist extension R shoulder AROM with UE ranger B ball holds with rolling ball forward and back Lateral pinch with lightweight clothespin Lateral pinch to grasp and release small pegs. Pt provided with soft yellow theraputty to use in the room.  Pt taken back to room and transferred to bed. Bed alarm set and all needs in place.  Therapy Documentation Precautions:  Precautions Precautions: Fall Precaution Comments: impulsivity with mobility, pain in L foot from ischemia, aphasic, right inattention Restrictions Weight Bearing Restrictions: No  Pain: Pain Assessment Pain Assessment: Faces Faces Pain  Scale: Hurts whole lot Pain Type: Acute pain Pain Location: Leg Pain Orientation: Left Pain Descriptors / Indicators: Aching;Crying Pain Onset: On-going Pain Intervention(s): RN made aware;Repositioned;Distraction  ADL:  See FIM for current functional status  Therapy/Group: Individual Therapy  [place] 03/15/2014, 11:33 AM

## 2014-03-15 NOTE — Progress Notes (Signed)
Patient information reviewed and entered into [facility] System by Becky Petrona Wyeth, covering PPS coordinator. Information including medical coding and functional independence measure will be reviewed and updated through discharge.  Per nursing, patient was given "Data Collection Information Summary for Patients in Inpatient Rehabilitation Facilities with attached Privacy Act Statement Health Care Records" upon admission.     

## 2014-03-15 NOTE — Progress Notes (Signed)
63 y.o. female with history of HTN, Hearing impaired, cervical cancer; who was admitted on 03/05/14 with right sided weakness, right facial droop and difficulty speaking X 1 day. Family was able to convince her to come to treat treatment and CT head revealed acute infarct superior left temporal lobe, portions of the posterior left frontal lobe, left extreme capsule and insular cortex with cytotoxic edema. 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 2D echo with EF 55% with basal inferior and inferior lateral hypokinesis and mid to apical inferolateral akinesis. UDS positive for THC. Patient with elevated cardiac enzymes and cardiology felt changes due to [facility] stroke. Carotid dopplers without significant ICA stenosis. 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 Dr. Rayann Heman consulted for input and TEE done revealing normal aorta, no thrombus or PFO and mildly depressed LVEF. Loop recorder placed on 03/12/14   Dr. Scot Dock consulted for input on chronic foot pain as well as ischemic left 5th toe as well as some cyanosis along lateral aspect of her foot. Patient with markedly diminshed flow left foot as well as difficulty palpating left femoral vein. Arteriogram done showing total occlusion of left common iliac artery with collaterals and did not appear acute  Subjective/Complaints: Minimal speech, follows commands with gestural cues  Review of Systems - limited by hearing impairment and aphasia  Objective: Vital Signs: Blood pressure 127/71, pulse 83, temperature 98 F (36.7 C), temperature source Oral, resp. rate 17, weight 89.8 kg (197 lb 15.6 oz), SpO2 96.00%. No results found. Results for orders placed during the hospital encounter of 03/12/14 (from the past 72 hour(s))  URINALYSIS, ROUTINE W REFLEX MICROSCOPIC     Status: Abnormal   Collection Time     03/12/14 10:05 PM      Result Value Ref Range   Color, Urine AMBER (*) YELLOW   Comment: BIOCHEMICALS MAY BE AFFECTED BY COLOR   APPearance CLEAR  CLEAR   Specific Gravity, Urine 1.025  1.005 - 1.030   pH 5.0  5.0 - 8.0   Glucose, UA NEGATIVE  NEGATIVE mg/dL   Hgb urine dipstick NEGATIVE  NEGATIVE   Bilirubin Urine SMALL (*) NEGATIVE   Ketones, ur NEGATIVE  NEGATIVE mg/dL   Protein, ur NEGATIVE  NEGATIVE mg/dL   Urobilinogen, UA 1.0  0.0 - 1.0 mg/dL   Nitrite NEGATIVE  NEGATIVE   Leukocytes, UA TRACE (*) NEGATIVE  URINE CULTURE     Status: None   Collection Time    03/12/14 10:05 PM      Result Value Ref Range   Specimen Description URINE, CLEAN CATCH     Special Requests NONE     Culture  Setup Time       Value: 03/12/2014 22:46     Performed at [facility]       Value: >=100,000 COLONIES/ML     Performed at [facility]   Culture       Value: Speculator     Performed at [facility]   Report Status 03/15/2014 FINAL     Organism ID, Bacteria PROTEUS MIRABILIS    URINE MICROSCOPIC-ADD ON     Status: Abnormal   Collection Time    03/12/14 10:05 PM      Result Value Ref Range   Squamous Epithelial / LPF FEW (*) RARE   WBC, UA 3-6  <3 WBC/hpf  RBC / HPF 0-2  <3 RBC/hpf   Bacteria, UA FEW (*) RARE   Casts HYALINE CASTS (*) NEGATIVE   Urine-Other MUCOUS PRESENT    CBC WITH DIFFERENTIAL     Status: None   Collection Time    03/13/14  5:19 AM      Result Value Ref Range   WBC 8.1  4.0 - 10.5 K/uL   RBC 4.17  3.87 - 5.11 MIL/uL   Hemoglobin 13.8  12.0 - 15.0 g/dL   HCT 40.1  36.0 - 46.0 %   MCV 96.2  78.0 - 100.0 fL   MCH 33.1  26.0 - 34.0 pg   MCHC 34.4  30.0 - 36.0 g/dL   RDW 12.6  11.5 - 15.5 %   Platelets 248  150 - 400 K/uL   Neutrophils Relative % 59  43 - 77 %   Neutro Abs 4.9  1.7 - 7.7 K/uL   Lymphocytes Relative 28  12 - 46 %   Lymphs Abs 2.2  0.7 - 4.0 K/uL   Monocytes Relative 10  3 - 12 %   Monocytes  Absolute 0.8  0.1 - 1.0 K/uL   Eosinophils Relative 3  0 - 5 %   Eosinophils Absolute 0.2  0.0 - 0.7 K/uL   Basophils Relative 0  0 - 1 %   Basophils Absolute 0.0  0.0 - 0.1 K/uL  COMPREHENSIVE METABOLIC PANEL     Status: Abnormal   Collection Time    03/13/14  5:19 AM      Result Value Ref Range   Sodium 139  137 - 147 mEq/L   Potassium 4.2  3.7 - 5.3 mEq/L   Chloride 102  96 - 112 mEq/L   CO2 22  19 - 32 mEq/L   Glucose, Bld 92  70 - 99 mg/dL   BUN 19  6 - 23 mg/dL   Creatinine, Ser 1.20 (*) 0.50 - 1.10 mg/dL   Calcium 9.4  8.4 - 10.5 mg/dL   Total Protein 7.2  6.0 - 8.3 g/dL   Albumin 3.3 (*) 3.5 - 5.2 g/dL   AST 27  0 - 37 U/L   ALT 17  0 - 35 U/L   Alkaline Phosphatase 58  39 - 117 U/L   Total Bilirubin 0.5  0.3 - 1.2 mg/dL   GFR calc non Af Amer 47 (*) >90 mL/min   GFR calc Af Amer 55 (*) >90 mL/min   Comment: (NOTE)     The eGFR has been calculated using the CKD EPI equation.     This calculation has not been validated in all clinical situations.     eGFR's persistently <90 mL/min signify possible Chronic Kidney     Disease.   Anion gap 15  5 - 15     HEENT: Hearing impaired Cardio: RRR and no murmurs Resp: CTA B/L and unlabored GI: BS positive and NT,ND Extremity:  Pulses positive and No Edema Skin:   Intact and Other cyanosis left 5th toe Neuro: Alert/Oriented and Abnormal Motor 4/5 on R side, 5/5 on left with exception of left toe flex/ext limited by pain Musc/Skel:  Normal and Extremity tender left 5th toe Gen NAD   Assessment/Plan: 1. Functional deficits secondary to Left MCA infarct which require 3+ hours per day of interdisciplinary therapy in a comprehensive inpatient rehab setting. Physiatrist is providing close team supervision and 24 hour management of active medical problems listed below. Physiatrist and rehab team continue to  assess barriers to discharge/monitor patient progress toward functional and medical goals.  FIM: FIM - Bathing Bathing  Steps Patient Completed: Chest;Right Arm;Left Arm;Abdomen;Front perineal area;Buttocks;Right upper leg;Left upper leg Bathing: 4: Min-Patient completes 8-9 36f10 parts or 75+ percent  FIM - Upper Body Dressing/Undressing Upper body dressing/undressing steps patient completed: Thread/unthread right bra strap;Thread/unthread left bra strap;Thread/unthread right sleeve of pullover shirt/dresss;Thread/unthread left sleeve of pullover shirt/dress;Put head through opening of pull over shirt/dress;Pull shirt over trunk Upper body dressing/undressing: 4: Min-Patient completed 75 plus % of tasks FIM - Lower Body Dressing/Undressing Lower body dressing/undressing steps patient completed: Thread/unthread right underwear leg;Thread/unthread left underwear leg;Thread/unthread right pants leg;Thread/unthread left pants leg Lower body dressing/undressing: 3: Mod-Patient completed 50-74% of tasks  FIM - Toileting Toileting: 0: Activity did not occur  FIM - TRadio producerDevices: Grab bars Toilet Transfers: 0-Activity did not occur  FIM - BIT sales professionalTransfer: 4: Sit > Supine: Min A (steadying pt. > 75%/lift 1 leg);3: Chair or W/C > Bed: Mod A (lift or lower assist);4: Bed > Chair or W/C: Min A (steadying Pt. > 75%)  FIM - Locomotion: Wheelchair Distance: 50 Locomotion: Wheelchair: 1: Total Assistance/staff pushes wheelchair (Pt<25%) FIM - Locomotion: Ambulation Locomotion: Ambulation Assistive Devices: Other (comment) (HHA) Ambulation/Gait Assistance: 4: Min assist Locomotion: Ambulation: 0: Activity did not occur  Comprehension Comprehension Mode: Visual Comprehension: 4-Understands basic 75 - 89% of the time/requires cueing 10 - 24% of the time  Expression Expression Mode: Verbal Expression Assistive Devices: 6-Communication board Expression: 2-Expresses basic 25 - 49% of the time/requires cueing 50 - 75% of the time. Uses single  words/gestures.  Social Interaction Social Interaction: 5-Interacts appropriately 90% of the time - Needs monitoring or encouragement for participation or interaction.  Problem Solving Problem Solving: 3-Solves basic 50 - 74% of the time/requires cueing 25 - 49% of the time  Memory Memory: 3-Recognizes or recalls 50 - 74% of the time/requires cueing 25 - 49% of the time  Medical Problem List and Plan:   1. Functional deficits secondary to L-MCA infarct with right hemiparesis, aphasia as well as ischemia left foot.   2. DVT Prophylaxis/Anticoagulation: Pharmaceutical: Lovenox   3. Pain Management: Indicating severe pain left foot. Will continue hydrocodone schedule bid as well as prn. Patient unable to ask for medication.   4. Mood: Labile and anxious in part due to pain as well as communication deficits. Will have LCSW follow for evaluation and support.   5. Neuropsych: This patient is not quite yet capable of making decisions on her own behalf.   6. Skin/Wound Care: Pressure relief measures.   7. Chronic left iliac artery occlusion: Will order PRAFO for prevention of breakdown. Continue prn medications for pain control.               -pain will be an issue with weight bearing/activities with therapy.   8. Hypokalemia: Will check follow up labs in am.  9.  UA - , Cult >100K proteus, probable colonization will not tx unless fever or symptomatic  LOS (Days) 3 A FACE TO FACE EVALUATION WAS PERFORMED  KIRSTEINS,ANDREW E 03/15/2014, 7:05 AM

## 2014-03-16 ENCOUNTER — Ambulatory Visit (HOSPITAL_COMMUNITY): Payer: Medicare Other | Admitting: Speech Pathology

## 2014-03-16 ENCOUNTER — Inpatient Hospital Stay (HOSPITAL_COMMUNITY): Payer: Medicare Other | Admitting: *Deleted

## 2014-03-16 ENCOUNTER — Encounter (HOSPITAL_COMMUNITY): Payer: Medicare Other | Admitting: Occupational Therapy

## 2014-03-16 NOTE — Progress Notes (Signed)
Occupational Therapy Session Note  Patient Details  Name: Traci Mclaughlin MRN: [id number] Date of Birth: July 06, 1951  Today's Date: 03/16/2014 OT Individual Time: 0800-0901 OT Individual Time Calculation (min): 61 min   Short Term Goals: Week 1:  OT Short Term Goal 1 (Week 1): Grooming:  Min assist with 3 grooming tasks while standing at sink OT Short Term Goal 2 (Week 1): Self Feeding:  Min assist with AE PRN OT Short Term Goal 3 (Week 1): Bath: steady assist when standing portion of bathing OT Short Term Goal 4 (Week 1): LB Dressing: Min assist to include sit and stand OT Short Term Goal 5 (Week 1): Toileting: Steady assist while patient performs toileting tasks  Skilled Therapeutic Interventions/Progress Updates:    Pt seen for BADL retraining of G/B/D with a focus on balance, use of RUE, and safety awareness.  Pt was sleeping at start of session, but woke easily awoken.  Pt was able to get out of bed with S and ambulated to bathroom with min HHA with no LOB. Pt stood in shower 50% of the time. She sat to attempt to wash feet but did not want to try to wash them, provided Assist.  Pt did have one small stumble, sitting down on BSC to dress but did not fall.  Pt completed bathing and dressing with min A.  Pt then stood at sink to complete grooming. She was transported to gym in w/c to sit on mat to work on [employer] neuro re-ed activities: Shoulder strengthening with 2# dowel bar with overhead reaches, rowing exercises Holding soccer ball between hands and reaching Stacking cones and reaching arm above shoulder height  elbow and wrist - weighted dowel exercises, 1# dumbell Grasp - gripping tennis ball, transferring ball from hand to hand B [facility] with opening and closing 10 different medicine bottles Pt demonstrating improved RUE movement throughout entire arm in all planes and functionally with washing self and pulling up her pants. Pt resting on mat for her next PT session at 9am.    Therapy  Documentation Precautions:  Precautions Precautions: Fall Precaution Comments: impulsivity with mobility, pain in L foot from ischemia, aphasic, right inattention Restrictions Weight Bearing Restrictions: No    Vital Signs: Therapy Vitals Temp: 98.7 F (37.1 C) Temp src: Oral Pulse Rate: 75 Resp: 18 BP: 139/78 mmHg Patient Position (if appropriate): Lying Oxygen Therapy SpO2: 100 % O2 Device: None (Room air) Pain: Pain Assessment Pain Assessment: No/denies pain Faces Pain Scale: Hurts a little bit Pain Type: Acute pain Pain Location: Foot Pain Orientation: Left Pain Descriptors / Indicators: Aching Pain Intervention(s): Repositioned ADL:  See FIM for current functional status  Therapy/Group: Individual Therapy  [place] 03/16/2014, 9:27 AM

## 2014-03-16 NOTE — Progress Notes (Signed)
Speech Language Pathology Daily Session Note  Patient Details  Name: Traci Mclaughlin MRN: [id number] Date of Birth: 04/05/1951  Today's Date: 03/16/2014 SLP Individual Time: 6256-3893 SLP Individual Time Calculation (min): 60 min  Short Term Goals: Week 1: SLP Short Term Goal 1 (Week 1): Pt will improve verbal expression to complete automatic sequences for ~75% accuracy with mod-max assist  SLP Short Term Goal 2 (Week 1): Pt will improve functional communication via any modality (verbalization, gestures, commnuication board) to indicate needs/wants to staff for ~75% accuracy with mod-max assist  SLP Short Term Goal 3 (Week 1): Pt will tolerate trials of upgraded solid consistencies with no overt s/s of aspiration with supervision cues.   SLP Short Term Goal 4 (Week 1): Pt will follow 2-step directives during functional tasks for 75% accuracy with mod assist.   Skilled Therapeutic Interventions:  Pt was seen for skilled speech therapy targeting dysphagia and language goals.  Upon arrival, pt was asleep but arousable to voice and light touch.   SLP positioned pt to be seated upright at edge of bed when consuming trial meal tray of dys 3 textures to maximize alertness for swallowing safety.  Pt exhibited no overt s/s of aspiration with dys 3 solids or thin liquids although pt presented with mild right pocketing of dry breads.  Pt was aware of pocketing and was noted to spontaneously clear residuals with a finger sweep.  SLP facilitated the session with structured naming drills with pt ~50-75% accurate for confrontational naming of pictures with written aids and mod assist phonemic placement cues. Furthermore, pt was >75% accurate for matching phrase to picture from a field of three with min assist multimodal cuing.  Pt's daughter was present for the duration of the therapy session, therefore SLP provided her with updates regarding pt's current progress in therapy and plan of care.  Per pt's daughter,  pt will have 24/7 supervision upon discharge to daughter's house in [place] between daughter, son in law, and potential hired caregivers.   Continue per current plan of care.    FIM:  Comprehension Comprehension Mode: Visual Comprehension: 5-Understands basic 90% of the time/requires cueing < 10% of the time Expression Expression Mode: Verbal Expression: 3-Expresses basic 50 - 74% of the time/requires cueing 25 - 50% of the time. Needs to repeat parts of sentences. Social Interaction Social Interaction: 5-Interacts appropriately 90% of the time - Needs monitoring or encouragement for participation or interaction. Problem Solving Problem Solving: 4-Solves basic 75 - 89% of the time/requires cueing 10 - 24% of the time Memory Memory: 3-Recognizes or recalls 50 - 74% of the time/requires cueing 25 - 49% of the time FIM - Eating Eating Activity: 5: Set-up assist for open containers  Pain Pain Assessment Pain Assessment: No/denies pain  Therapy/Group: Individual Therapy  Windell Moulding, M.A. CCC-SLP  Natalin Bible, Selinda Orion 03/16/2014, 4:17 PM

## 2014-03-16 NOTE — Progress Notes (Signed)
63 y.o. female with history of HTN, Hearing impaired, cervical cancer; who was admitted on 03/05/14 with right sided weakness, right facial droop and difficulty speaking X 1 day. Family was able to convince her to come to treat treatment and CT head revealed acute infarct superior left temporal lobe, portions of the posterior left frontal lobe, left extreme capsule and insular cortex with cytotoxic edema. 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 2D echo with EF 55% with basal inferior and inferior lateral hypokinesis and mid to apical inferolateral akinesis. UDS positive for THC. Patient with elevated cardiac enzymes and cardiology felt changes due to [facility] stroke. Carotid dopplers without significant ICA stenosis. 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 Dr. Rayann Heman consulted for input and TEE done revealing normal aorta, no thrombus or PFO and mildly depressed LVEF. Loop recorder placed on 03/12/14   Dr. Scot Dock consulted for input on chronic foot pain as well as ischemic left 5th toe as well as some cyanosis along lateral aspect of her foot. Patient with markedly diminshed flow left foot as well as difficulty palpating left femoral vein. Arteriogram done showing total occlusion of left common iliac artery with collaterals and did not appear acute  Subjective/Complaints: Minimal speech, able to repeat "good Morning"  Review of Systems - limited by hearing impairment and aphasia  Objective: Vital Signs: Blood pressure 139/78, pulse 75, temperature 98.7 F (37.1 C), temperature source Oral, resp. rate 18, weight 89.8 kg (197 lb 15.6 oz), SpO2 100.00%. No results found. No results found for this or any previous visit (from the past 72 hour(s)).   HEENT: Hearing impaired Cardio: RRR and no murmurs Resp: CTA B/L and unlabored GI: BS positive and  NT,ND Extremity:  Pulses positive and No Edema Skin:   Intact and Other cyanosis left 5th toe Neuro: Alert/Oriented and Abnormal Motor 4/5 on R side, 5/5 on left with exception of left toe flex/ext limited by pain Musc/Skel:  Normal and Extremity tender left 5th toe, no Left heel or midfoot pain Gen NAD   Assessment/Plan: 1. Functional deficits secondary to Left MCA infarct which require 3+ hours per day of interdisciplinary therapy in a comprehensive inpatient rehab setting. Physiatrist is providing close team supervision and 24 hour management of active medical problems listed below. Physiatrist and rehab team continue to assess barriers to discharge/monitor patient progress toward functional and medical goals.  FIM: FIM - Bathing Bathing Steps Patient Completed: Chest;Right Arm;Left Arm;Abdomen;Front perineal area;Right upper leg;Left upper leg;Right lower leg (including foot);Left lower leg (including foot) Bathing: 4: Min-Patient completes 8-9 45f 10 parts or 75+ percent  FIM - Upper Body Dressing/Undressing Upper body dressing/undressing steps patient completed: Thread/unthread right sleeve of pullover shirt/dresss;Thread/unthread left sleeve of pullover shirt/dress;Put head through opening of pull over shirt/dress;Pull shirt over trunk Upper body dressing/undressing: 5: Set-up assist to: Obtain clothing/put away FIM - Lower Body Dressing/Undressing Lower body dressing/undressing steps patient completed: Thread/unthread right underwear leg;Thread/unthread left underwear leg;Thread/unthread right pants leg;Thread/unthread left pants leg;Pull underwear up/down;Pull pants up/down;Don/Doff right sock;Don/Doff left sock Lower body dressing/undressing: 5: Supervision: Safety issues/verbal cues  FIM - Toileting Toileting: 0: Activity did not occur  FIM - Radio producer Devices: Grab bars Toilet Transfers: 0-Activity did not occur  FIM - Photographer Transfer: 5: Supine > Sit: Supervision (verbal cues/safety issues);5: Sit > Supine: Supervision (verbal cues/safety issues);5: Bed > Chair or W/C:  Supervision (verbal cues/safety issues);5: Chair or W/C > Bed: Supervision (verbal cues/safety issues)  FIM - Locomotion: Wheelchair Distance: 50 Locomotion: Wheelchair: 1: Total Assistance/staff pushes wheelchair (Pt<25%) FIM - Locomotion: Ambulation Locomotion: Ambulation Assistive Devices: Administrator Ambulation/Gait Assistance: 4: Min guard;5: Supervision Locomotion: Ambulation: 5: Travels 150 ft or more with supervision/safety issues  Comprehension Comprehension Mode: Auditory Comprehension: 4-Understands basic 75 - 89% of the time/requires cueing 10 - 24% of the time  Expression Expression Mode: Verbal Expression Assistive Devices: 6-Communication board Expression: 3-Expresses basic 50 - 74% of the time/requires cueing 25 - 50% of the time. Needs to repeat parts of sentences.  Social Interaction Social Interaction: 5-Interacts appropriately 90% of the time - Needs monitoring or encouragement for participation or interaction.  Problem Solving Problem Solving: 4-Solves basic 75 - 89% of the time/requires cueing 10 - 24% of the time  Memory Memory: 3-Recognizes or recalls 50 - 74% of the time/requires cueing 25 - 49% of the time  Medical Problem List and Plan:   1. Functional deficits secondary to L-MCA infarct with right hemiparesis, aphasia as well as ischemia left foot.   2. DVT Prophylaxis/Anticoagulation: Pharmaceutical: Lovenox   3. Pain Management: Indicating severe pain left foot. Will continue hydrocodone schedule bid as well as prn. Patient unable to ask for medication.   4. Mood: Labile and anxious in part due to pain as well as communication deficits. Will have LCSW follow for evaluation and support.   5. Neuropsych: This patient is not quite yet capable of making decisions on her own behalf.   6.  Skin/Wound Care: Pressure relief measures.   7. Chronic left iliac artery occlusion: Will order PRAFO for prevention of breakdown. Continue prn medications for pain control.               -pain will be an issue with weight bearing/activities with therapy.DARCO sandal ordered, did not see in room today   8. Hypokalemia: Will check follow up labs in am.  9.  UA - , Cult >100K proteus, probable colonization will not tx unless fever or symptomatic  LOS (Days) 4 A FACE TO FACE EVALUATION WAS PERFORMED  KIRSTEINS,ANDREW E 03/16/2014, 6:47 AM

## 2014-03-16 NOTE — Progress Notes (Signed)
Physical Therapy Session Note  Patient Details  Name: Traci Mclaughlin MRN: [id number] Date of Birth: 09-Mar-1951  Today's Date: 03/16/2014 PT Individual Time: 9:01-10:00 (70min)    Short Term Goals: Week 1:  PT Short Term Goal 1 (Week 1): Pt will perform stand pivot transfer at S level w/ LRAD PT Short Term Goal 2 (Week 1): Pt will perform dynamic standing activities at S level x 5 mins to increase independence with ADL's.  PT Short Term Goal 3 (Week 1): Pt will ambulate x 100' w/ LRAD at min/guard level with min cues for safety.  PT Short Term Goal 4 (Week 1): Pt will perform w/c mobility using most effective manner (likely LUE, RLE) x 100' at min A level.   Skilled Therapeutic Interventions/Progress Updates:  Tx focused on functional mobility and balance, gait training with RW, and threx for activity tolerance and strength.  Handoff from OT. Pt able to participate in all therapeutic activities, but with c/o pain and needing some rest breaks.  Communicated via writing and tactile cues. Talked with daughter at end of tx, who feels informed about pt's status and DC plans.   Pt instructed in [place] for balance and strengthening per hand out with cues for technique. Pt somehwat limited by pain. Performed supine therex at end of tx as well as tolerated by LLE pain, including ankle pumps, R heel slides, SLR x10 each. Pt performed Nustep x62min with bil LEs and UEs with encouragement.    Performed gait with RW in controlled and home settings with min-guard A and increased lateral sway. Continuity of gait improved with training and practice. 2x100' and 1x200' in controlled settings, 2x25' in home environments.   Furniture transfers pt needed up to min A for lifting and bed mobility with S, cues for positioning.   Stair training x8 with 2 rails and min-guard for steadying, step-to pattern.  Pt left up in bed with alarm on and all needs in reach.       Therapy Documentation Precautions:   Precautions Precautions: Fall Precaution Comments: impulsivity with mobility, pain in L foot from ischemia, aphasic, right inattention Restrictions Weight Bearing Restrictions: No General:   Vital Signs: Therapy Vitals Temp: 98.7 F (37.1 C) Temp src: Oral Pulse Rate: 75 Resp: 18 BP: 139/78 mmHg Patient Position (if appropriate): Lying Oxygen Therapy SpO2: 100 % O2 Device: None (Room air) Pain: Pain Assessment Pain Assessment:8/10  Faces Pain Scale: Hurts a lot Pain Type: Acute pain Pain Location: Foot Pain Orientation: Left Pain Descriptors / Indicators: Aching Pain Intervention(s): Repositioned, modified tx  See FIM for current functional status  Therapy/Group: Individual Therapy Kennieth Rad, PT, DPT  03/16/2014, 9:35 AM

## 2014-03-16 NOTE — Progress Notes (Signed)
Speech Language Pathology Make-Up Session Note  Patient Details  Name: Traci Mclaughlin MRN: [id number] Date of Birth: 1951/03/16  Today's Date: 03/16/2014 SLP Individual Time: 1030-1045 SLP Individual Time Calculation (min): 15 min  Short Term Goals: Week 1: SLP Short Term Goal 1 (Week 1): Pt will improve verbal expression to complete automatic sequences for ~75% accuracy with mod-max assist  SLP Short Term Goal 2 (Week 1): Pt will improve functional communication via any modality (verbalization, gestures, commnuication board) to indicate needs/wants to staff for ~75% accuracy with mod-max assist  SLP Short Term Goal 3 (Week 1): Pt will tolerate trials of upgraded solid consistencies with no overt s/s of aspiration with supervision cues.   SLP Short Term Goal 4 (Week 1): Pt will follow 2-step directives during functional tasks for 75% accuracy with mod assist.   Skilled Therapeutic Interventions: Make-up Session: Skilled treatment session focused on cognitive-linguistic goals. Upon arrival, pt was awake while upright in bed. SLP facilitated session by providing written cues to increase comprehension of new information in regards to SLP upgrading her textures to Dys. 3 in hopes of increasing PO intake.  The patient reported at the phrase level, "I don't eat all the time, only 2 times." The patient was unable to spontaneously name any foods that she liked s but reported, "I will try anything" when educated on meal options for lunch today. The patient also reported, "I'm not worried about food" and gestured to her leg as she started to cry. The patient's daughter reported a physician informed the patient that her leg will eventually need to be amputated, which has thrown her for "an emotional loop." This clinician also provided education to that patient's daughter in regards to appropriate textures for upgraded diet of Dys. 3. Continue with current plan of care.    FIM:  Comprehension Comprehension  Mode: Visual Comprehension: 4-Understands basic 75 - 89% of the time/requires cueing 10 - 24% of the time Expression Expression Mode: Verbal Expression: 3-Expresses basic 50 - 74% of the time/requires cueing 25 - 50% of the time. Needs to repeat parts of sentences. Social Interaction Social Interaction: 5-Interacts appropriately 90% of the time - Needs monitoring or encouragement for participation or interaction. Problem Solving Problem Solving: 4-Solves basic 75 - 89% of the time/requires cueing 10 - 24% of the time Memory Memory: 3-Recognizes or recalls 50 - 74% of the time/requires cueing 25 - 49% of the time  Pain Pain Assessment Pain Assessment: No/denies pain   Therapy/Group: Individual Therapy  Concepcion Kirkpatrick 03/16/2014, 10:55 AM

## 2014-03-16 NOTE — Progress Notes (Signed)
Social Work Patient ID: Traci Mclaughlin, female   DOB: 1951-07-13, 63 y.o.   MRN: [id number] Met with pt and daughter-[name] to discuss plan at discharge.  Pt wants to go home with this daughter.  She can provide 24 hr supervision level, she is moving Back to [place] this weekend and her children start school on Tuesday.  Discussed with Cole-PT moving up discharge to next Sat and have daughter come and go through family education and Discharge her then.  She will discuss with other team members and this worker will check with MD.  Daughter was agreeable to this plan.  Will work toward discharge plan to [name]'s home in [place] Next Sat after family education.

## 2014-03-17 ENCOUNTER — Inpatient Hospital Stay (HOSPITAL_COMMUNITY): Payer: Medicare Other

## 2014-03-17 ENCOUNTER — Ambulatory Visit (HOSPITAL_COMMUNITY): Payer: Medicare Other | Admitting: Speech Pathology

## 2014-03-17 MED ORDER — OXYCODONE HCL 5 MG PO TABS
10.0000 mg | ORAL_TABLET | Freq: Four times a day (QID) | ORAL | Status: DC | PRN
  Administered 2014-03-18 – 2014-03-22 (×11): 10 mg via ORAL
  Administered 2014-03-22: 15 mg via ORAL
  Administered 2014-03-23 (×2): 10 mg via ORAL
  Filled 2014-03-17 (×14): qty 2
  Filled 2014-03-17: qty 3

## 2014-03-17 MED ORDER — SENNOSIDES-DOCUSATE SODIUM 8.6-50 MG PO TABS
2.0000 | ORAL_TABLET | Freq: Every day | ORAL | Status: DC
  Administered 2014-03-17 – 2014-03-21 (×5): 2 via ORAL
  Filled 2014-03-17 (×6): qty 2

## 2014-03-17 MED ORDER — SORBITOL 70 % SOLN
30.0000 mL | Freq: Once | Status: AC
  Administered 2014-03-17: 30 mL via ORAL
  Filled 2014-03-17: qty 30

## 2014-03-17 NOTE — Progress Notes (Signed)
Speech Language Pathology Daily Session Note  Patient Details  Name: Traci Mclaughlin MRN: [id number] Date of Birth: Apr 13, 1951  Today's Date: 03/17/2014 SLP Individual Time: 1200-1230 SLP Individual Time Calculation (min): 30 min  Short Term Goals: Week 1: SLP Short Term Goal 1 (Week 1): Pt will improve verbal expression to complete automatic sequences for ~75% accuracy with mod-max assist  SLP Short Term Goal 2 (Week 1): Pt will improve functional communication via any modality (verbalization, gestures, commnuication board) to indicate needs/wants to staff for ~75% accuracy with mod-max assist  SLP Short Term Goal 3 (Week 1): Pt will tolerate trials of upgraded solid consistencies with no overt s/s of aspiration with supervision cues.   SLP Short Term Goal 4 (Week 1): Pt will follow 2-step directives during functional tasks for 75% accuracy with mod assist.   Skilled Therapeutic Interventions: Skilled treatment session focused on addressing dysphagia goals. SLP assisted patient with positioning in bed and setup of meal tray. Patient able to feed self but benefited from periodic cues to clear right sided pocketing/anterior spillage of PO.'s . Patient did not exhibit any overt s/s aspiration with P.O's. SLP communicated with patient via writing on dry erase board and patient responded verbally or with gestures or both. when asked about pain, she said "my leg" and pointed to left leg. When asked how bad the pain was, she said, "I can't say" but was able to choose from 0-10 number pain scale.    FIM:  Comprehension Comprehension Mode: Visual Comprehension: 4-Understands basic 75 - 89% of the time/requires cueing 10 - 24% of the time Expression Expression Mode: Verbal Expression: 2-Expresses basic 25 - 49% of the time/requires cueing 50 - 75% of the time. Uses single words/gestures. Social Interaction Social Interaction: 4-Interacts appropriately 75 - 89% of the time - Needs redirection for  appropriate language or to initiate interaction. Problem Solving Problem Solving: 3-Solves basic 50 - 74% of the time/requires cueing 25 - 49% of the time Memory Memory: 3-Recognizes or recalls 50 - 74% of the time/requires cueing 25 - 49% of the time FIM - Eating Eating Activity: 5: Set-up assist for open containers  Pain Pain Assessment Pain Assessment: 0-10 Pain Score: 8  Faces Pain Scale: Hurts whole lot Pain Type: Acute pain Pain Location: Leg Pain Orientation: Left Pain Descriptors / Indicators:  (unable to describe) Pain Onset: On-going Pain Intervention(s): RN made aware Multiple Pain Sites: No PAINAD (Pain Assessment in Advanced Dementia) Breathing: normal Negative Vocalization: occasional moan/groan, low speech, negative/disapproving quality Facial Expression: facial grimacing Body Language: tense, distressed pacing, fidgeting Consolability: distracted or reassured by voice/touch PAINAD Score: 5  Therapy/Group: Individual Therapy  Dannial Monarch 03/17/2014, 3:27 PM  Sonia Baller, MA, CCC-SLP [facility] Speech-Language Pathologist

## 2014-03-17 NOTE — Progress Notes (Signed)
Physical Therapy Session Note  Patient Details  Name: Traci Mclaughlin MRN: [id number] Date of Birth: 1951-04-07  Today's Date: 03/17/2014  Skilled Therapeutic Interventions/Progress Updates:  Pt received semi-reclined in bed sleeping, req mod multimodal cues to wake. Pt immediately stating, "I'm not going today." Pt unable to clarify as to why, therapist providing max encouragement x45min for attempted participation in session. Pt repeated declining participation, pulling covers back over self. Pt eventually stating and almost tearful, "my leg it just hurts so bad." Pt left w/ all needs in reach, bed alarm on. RN made aware of pt's pain, unrated.   Pt missed 60min of scheduled skilled physical therapy.   Therapy Documentation Precautions:  Precautions Precautions: Fall Precaution Comments: impulsivity with mobility, pain in L foot from ischemia, aphasic, right inattention Restrictions Weight Bearing Restrictions: No General: PT Amount of Missed Time (min): 45 Minutes PT Missed Treatment Reason: Pain;Patient fatigue;Patient unwilling to participate Pain: Pain Assessment Pain Assessment: Faces Faces Pain Scale: Hurts whole lot Pain Type: Acute pain Pain Location: Leg Pain Orientation: Left Pain Descriptors / Indicators: Aching Pain Onset: On-going Pain Intervention(s): RN made aware;Repositioned;Rest  See FIM for current functional status  Therapy/Group: Individual Therapy  [name] 03/17/2014, 2:22 PM

## 2014-03-17 NOTE — Plan of Care (Signed)
Problem: RH PAIN MANAGEMENT Goal: RH STG PAIN MANAGED AT OR BELOW PT'S PAIN GOAL < 4; faces (little hurt)  Outcome: Not Progressing Left foot pain not managed with norco. MD aware. New orders received

## 2014-03-17 NOTE — Progress Notes (Signed)
63 y.o. female with history of HTN, Hearing impaired, cervical cancer; who was admitted on 03/05/14 with right sided weakness, right facial droop and difficulty speaking X 1 day. Family was able to convince her to come to treat treatment and CT head revealed acute infarct superior left temporal lobe, portions of the posterior left frontal lobe, left extreme capsule and insular cortex with cytotoxic edema. 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 2D echo with EF 55% with basal inferior and inferior lateral hypokinesis and mid to apical inferolateral akinesis. UDS positive for THC. Patient with elevated cardiac enzymes and cardiology felt changes due to [facility] stroke. Carotid dopplers without significant ICA stenosis. 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 Dr. Rayann Heman consulted for input and TEE done revealing normal aorta, no thrombus or PFO and mildly depressed LVEF. Loop recorder placed on 03/12/14   Dr. Scot Dock consulted for input on chronic foot pain as well as ischemic left 5th toe as well as some cyanosis along lateral aspect of her foot. Patient with markedly diminshed flow left foot as well as difficulty palpating left femoral vein. Arteriogram done showing total occlusion of left common iliac artery with collaterals and did not appear acute  Subjective/Complaints: Needs encouragement to speak, repeats good morning  Review of Systems - limited by hearing impairment and aphasia  Objective: Vital Signs: Blood pressure 150/72, pulse 77, temperature 98.9 F (37.2 C), temperature source Oral, resp. rate 20, weight 89.8 kg (197 lb 15.6 oz), SpO2 98.00%. No results found. No results found for this or any previous visit (from the past 72 hour(s)).   HEENT: Hearing impaired Cardio: RRR and no murmurs Resp: CTA B/L and unlabored GI: BS positive and  NT,ND Extremity:  Pulses positive and No Edema Skin:   Intact and Other cyanosis left 5th toe Neuro: Alert, needs gestural cues to follow commands and Abnormal Motor 4/5 on R side, 5/5 on left with exception of left toe flex/ext limited by pain Musc/Skel:  Normal and Extremity tender left 5th toe, no Left heel or midfoot pain Gen NAD   Assessment/Plan: 1. Functional deficits secondary to Left MCA infarct which require 3+ hours per day of interdisciplinary therapy in a comprehensive inpatient rehab setting. Physiatrist is providing close team supervision and 24 hour management of active medical problems listed below. Physiatrist and rehab team continue to assess barriers to discharge/monitor patient progress toward functional and medical goals.  FIM: FIM - Bathing Bathing Steps Patient Completed: Chest;Right Arm;Left Arm;Abdomen;Front perineal area;Right upper leg;Left upper leg;Buttocks Bathing: 4: Min-Patient completes 8-9 85f 10 parts or 75+ percent  FIM - Upper Body Dressing/Undressing Upper body dressing/undressing steps patient completed: Thread/unthread right sleeve of pullover shirt/dresss;Thread/unthread left sleeve of pullover shirt/dress;Put head through opening of pull over shirt/dress;Pull shirt over trunk;Thread/unthread right bra strap;Thread/unthread left bra strap Upper body dressing/undressing: 4: Min-Patient completed 75 plus % of tasks FIM - Lower Body Dressing/Undressing Lower body dressing/undressing steps patient completed: Thread/unthread right underwear leg;Thread/unthread left underwear leg;Thread/unthread right pants leg;Thread/unthread left pants leg;Pull underwear up/down;Pull pants up/down;Don/Doff right sock;Don/Doff left sock Lower body dressing/undressing: 5: Supervision: Safety issues/verbal cues  FIM - Toileting Toileting: 0: Activity did not occur  FIM - Radio producer Devices: Product manager Transfers: 0-Activity did not  occur  FIM - Control and instrumentation engineer Devices: Environmental consultant;Arm rests Bed/Chair Transfer: 5: Sit > Supine: Supervision (verbal cues/safety issues);5: Bed >  Chair or W/C: Supervision (verbal cues/safety issues);4: Chair or W/C > Bed: Min A (steadying Pt. > 75%)  FIM - Locomotion: Wheelchair Distance: 50 Locomotion: Wheelchair: 0: Activity did not occur FIM - Locomotion: Ambulation Locomotion: Ambulation Assistive Devices: Administrator Ambulation/Gait Assistance: 4: Min guard Locomotion: Ambulation: 4: Travels 150 ft or more with minimal assistance (Pt.>75%)  Comprehension Comprehension Mode: Visual Comprehension: 4-Understands basic 75 - 89% of the time/requires cueing 10 - 24% of the time  Expression Expression Mode: Verbal Expression Assistive Devices: 6-Communication board Expression: 2-Expresses basic 25 - 49% of the time/requires cueing 50 - 75% of the time. Uses single words/gestures.  Social Interaction Social Interaction: 5-Interacts appropriately 90% of the time - Needs monitoring or encouragement for participation or interaction.  Problem Solving Problem Solving: 3-Solves basic 50 - 74% of the time/requires cueing 25 - 49% of the time  Memory Memory: 3-Recognizes or recalls 50 - 74% of the time/requires cueing 25 - 49% of the time  Medical Problem List and Plan:   1. Functional deficits secondary to L-MCA infarct with right hemiparesis, aphasia as well as ischemia left foot.   2. DVT Prophylaxis/Anticoagulation: Pharmaceutical: Lovenox   3. Pain Management: Indicating severe pain left foot. Will continue hydrocodone schedule bid as well as prn. Patient unable to ask for medication.   4. Mood: Labile and anxious in part due to pain as well as communication deficits. Will have LCSW follow for evaluation and support.   5. Neuropsych: This patient is not quite yet capable of making decisions on her own behalf.   6. Skin/Wound Care: Pressure relief  measures.   7. Chronic left iliac artery occlusion: Will order PRAFO for prevention of breakdown. Continue prn medications for pain control.               -pain will be an issue with weight bearing/activities with therapy.DARCO sandal ordered,8. Hypokalemia: Will check follow up labs in am.  9.  UA - , Cult >100K proteus, probable colonization will not tx unless fever or symptomatic  LOS (Days) 5 A FACE TO FACE EVALUATION WAS PERFORMED  Shoichi Mielke E 03/17/2014, 7:21 AM

## 2014-03-18 ENCOUNTER — Inpatient Hospital Stay (HOSPITAL_COMMUNITY): Payer: Medicare Other | Admitting: *Deleted

## 2014-03-18 ENCOUNTER — Inpatient Hospital Stay (HOSPITAL_COMMUNITY): Payer: Medicare Other

## 2014-03-18 NOTE — Progress Notes (Signed)
63 y.o. female with history of HTN, Hearing impaired, cervical cancer; who was admitted on 03/05/14 with right sided weakness, right facial droop and difficulty speaking X 1 day. Family was able to convince her to come to treat treatment and CT head revealed acute infarct superior left temporal lobe, portions of the posterior left frontal lobe, left extreme capsule and insular cortex with cytotoxic edema. MRI/MRA brain with acute large L-MCA infarct,   Dr. Scot Dock consulted for input on chronic foot pain as well as ischemic left 5th toe as well as some cyanosis along lateral aspect of her foot. Patient with markedly diminshed flow left foot as well as difficulty palpating left femoral vein. Arteriogram done showing total occlusion of left common iliac artery with collaterals and did not appear acute  Subjective/Complaints: Pt not initiating speech  Review of Systems - limited by hearing impairment and aphasia  Objective: Vital Signs: Blood pressure 150/83, pulse 91, temperature 98.6 F (37 C), temperature source Oral, resp. rate 18, weight 89.8 kg (197 lb 15.6 oz), SpO2 95.00%. No results found. No results found for this or any previous visit (from the past 72 hour(s)).   HEENT: Hearing impaired Cardio: RRR and no murmurs Resp: CTA B/L and unlabored GI: BS positive and NT,ND Extremity:  Pulses positive and No Edema Skin:   Intact and Other cyanosis left 5th toe-unchanged Neuro: Alert, needs gestural cues to follow commands and Abnormal Motor 4/5 on R side, 5/5 on left with exception of left toe flex/ext limited by pain Musc/Skel:  Normal and Extremity tender left 5th toe, no Left heel or midfoot pain, apprehensive when foot is examined Gen NAD   Assessment/Plan: 1. Functional deficits secondary to Left MCA infarct which require 3+ hours per day of interdisciplinary therapy in a comprehensive inpatient rehab setting. Physiatrist is providing close team supervision and 24 hour management  of active medical problems listed below. Physiatrist and rehab team continue to assess barriers to discharge/monitor patient progress toward functional and medical goals.  FIM: FIM - Bathing Bathing Steps Patient Completed: Chest;Right Arm;Left Arm;Abdomen;Front perineal area;Right upper leg;Left upper leg;Buttocks Bathing: 4: Min-Patient completes 8-9 43f 10 parts or 75+ percent  FIM - Upper Body Dressing/Undressing Upper body dressing/undressing steps patient completed: Thread/unthread right sleeve of pullover shirt/dresss;Thread/unthread left sleeve of pullover shirt/dress;Put head through opening of pull over shirt/dress;Pull shirt over trunk;Thread/unthread right bra strap;Thread/unthread left bra strap Upper body dressing/undressing: 4: Min-Patient completed 75 plus % of tasks FIM - Lower Body Dressing/Undressing Lower body dressing/undressing steps patient completed: Thread/unthread right underwear leg;Thread/unthread left underwear leg;Thread/unthread right pants leg;Thread/unthread left pants leg;Pull underwear up/down;Pull pants up/down;Don/Doff right sock;Don/Doff left sock Lower body dressing/undressing: 5: Supervision: Safety issues/verbal cues  FIM - Toileting Toileting steps completed by patient: Adjust clothing prior to toileting;Performs perineal hygiene;Adjust clothing after toileting Toileting Assistive Devices: Grab bar or rail for support Toileting: 5: Supervision: Safety issues/verbal cues  FIM - Radio producer Devices: Grab bars Toilet Transfers: 4-To toilet/BSC: Min A (steadying Pt. > 75%);4-From toilet/BSC: Min A (steadying Pt. > 75%)  FIM - Control and instrumentation engineer Devices: Walker;Arm rests Bed/Chair Transfer: 4: Bed > Chair or W/C: Min A (steadying Pt. > 75%);4: Chair or W/C > Bed: Min A (steadying Pt. > 75%)  FIM - Locomotion: Wheelchair Distance: 50 Locomotion: Wheelchair: 0: Activity did not occur FIM -  Locomotion: Ambulation Locomotion: Ambulation Assistive Devices: Administrator Ambulation/Gait Assistance: 4: Min guard Locomotion: Ambulation: 4: Travels 150 ft or more with  minimal assistance (Pt.>75%)  Comprehension Comprehension Mode: Visual Comprehension: 4-Understands basic 75 - 89% of the time/requires cueing 10 - 24% of the time  Expression Expression Mode: Verbal Expression Assistive Devices: 6-Communication board Expression: 2-Expresses basic 25 - 49% of the time/requires cueing 50 - 75% of the time. Uses single words/gestures.  Social Interaction Social Interaction: 4-Interacts appropriately 75 - 89% of the time - Needs redirection for appropriate language or to initiate interaction.  Problem Solving Problem Solving: 3-Solves basic 50 - 74% of the time/requires cueing 25 - 49% of the time  Memory Memory: 3-Recognizes or recalls 50 - 74% of the time/requires cueing 25 - 49% of the time  Medical Problem List and Plan:   1. Functional deficits secondary to L-MCA infarct with right hemiparesis, aphasia as well as ischemia left foot.   2. DVT Prophylaxis/Anticoagulation: Pharmaceutical: Lovenox   3. Pain Management: Indicating severe pain left foot. Trial of oxycodone. Patient unable to ask for medication.   4. Mood: Labile and anxious in part due to pain as well as communication deficits. Will have LCSW follow for evaluation and support.   5. Neuropsych: This patient is not  yet capable of making decisions on her own behalf.   6. Skin/Wound Care: Pressure relief measures.   7. Chronic left iliac artery occlusion: Will order PRAFO for prevention of breakdown. Continue prn medications for pain control.               -pain will be an issue with weight bearing/activities with therapy.DARCO sandal ordered,              8. Hypokalemia: resolved 9.  UA - , Cult >100K proteus, probable colonization will not tx unless fever or symptomatic  LOS (Days) 6 A FACE TO FACE EVALUATION  WAS PERFORMED  KIRSTEINS,ANDREW E 03/18/2014, 7:02 AM

## 2014-03-18 NOTE — Progress Notes (Signed)
Physical Therapy Note  Patient Details  Name: Traci Mclaughlin MRN: [id number] Date of Birth: August 02, 1950 Today's Date: 03/18/2014    Pt received supine in bed. When pt saw therapist, began shaking head and saying "no, no, no" before therapist spoke.  Pt refused to participate in therapy after multimodal encouragement. Pt rated pain at 9/10, said it was all over. RN made aware. Pt missed 45 minutes of scheduled therapy time.  Rada Hay 03/18/2014, 4:13 PM

## 2014-03-18 NOTE — Progress Notes (Signed)
Physical Therapy Session Note  Patient Details  Name: Traci Mclaughlin MRN: [id number] Date of Birth: [birth year]  Today's Date: 03/18/2014 PT Individual Time: 1031-1101 PT Individual Time Calculation (min): 30 min    Short Term Goals: Week 1:  PT Short Term Goal 1 (Week 1): Pt will perform stand pivot transfer at S level w/ LRAD PT Short Term Goal 2 (Week 1): Pt will perform dynamic standing activities at S level x 5 mins to increase independence with ADL's.  PT Short Term Goal 3 (Week 1): Pt will ambulate x 100' w/ LRAD at min/guard level with min cues for safety.  PT Short Term Goal 4 (Week 1): Pt will perform w/c mobility using most effective manner (likely LUE, RLE) x 100' at min A level.   Skilled Therapeutic Interventions/Progress Updates:    Session 1: Pt received supine in bed, agreeable to participate in room/bed level therapy w/ max encouragement (pt refused to leave room and go to gym). Session focused on bed mobility, BLE strengthening, functional mobility. Pt rolled L/R w/ S, occasional MinA for managing LLE. x10 sit<>stands, 2x10 LAQ, ankle pumps seated EOB w/ BLE. Supine heel slides, SLR 2x10 BLE. W/ max encouragement pt agreed to transfer bed>recliner for increased upright tolerance. Ambulated 10' w/ RW w/ MinA to recliner, noted hyperextension in L knee during stance phase. Pt requested session end early due to pain and fatigue, agreed to participate in therapy this PM. Pt missed 15 minutes of PT time. Pt left seated in recliner w/ BLE elevated w/ all needs within reach.   Therapy Documentation Precautions:  Precautions Precautions: Fall Precaution Comments: impulsivity with mobility, pain in L foot from ischemia, aphasic, right inattention Restrictions Weight Bearing Restrictions: No Vital Signs: Therapy Vitals Pulse Rate: 90 BP: 148/73 mmHg Pain: Pain Assessment Pain Assessment: Faces Pain Score: 10-Worst pain ever Faces Pain Scale: Hurts little more Pain Type:  Acute pain Pain Location: Foot Pain Orientation: Left Pain Descriptors / Indicators: Grimacing Pain Onset: With Activity Pain Intervention(s): Medication (See eMAR) Locomotion : Ambulation Ambulation/Gait Assistance: 4: Min guard   See FIM for current functional status  Therapy/Group: Individual Therapy  Rada Hay Rada Hay, PT, DPT 03/18/2014, 12:42 PM

## 2014-03-18 NOTE — Progress Notes (Addendum)
Occupational Therapy Session Note  Patient Details  Name: Traci Mclaughlin MRN: [id number] Date of Birth: 02/10/1951  Today's Date: 03/18/2014 OT Individual Time:  -   1100-1230  (90 min)     Short Term Goals: Week 1:  OT Short Term Goal 1 (Week 1): Grooming:  Min assist with 3 grooming tasks while standing at sink OT Short Term Goal 2 (Week 1): Self Feeding:  Min assist with AE PRN OT Short Term Goal 3 (Week 1): Bath: steady assist when standing portion of bathing OT Short Term Goal 4 (Week 1): LB Dressing: Min assist to include sit and stand OT Short Term Goal 5 (Week 1): Toileting: Steady assist while patient performs toileting tasks :     Skilled Therapeutic Interventions/Progress Updates:  1st session;    Pt. seen for BADL. She was lying in bed upon OT arrival.  She stated that she just wanted to be left alone.  Attempted encouragement to participate, but pt not willing.  Came back later and pt agreed to work with OT.  Engaged in therapeutic exercise for RUE using  PNF, AROM, AAROM in functional eating, drinking from cup, brushing teeth.  Pt. Performed RUE Active movements with minimal assist.  She went from sit to stand with minimal assist and ambulated over to sink.  She washed hands.  Ambulated back to EOB and OT set her up for lunch.   Incorportated RUE in feeding self about 50 % of meal with spoon for mashed potatoes and chopped meat with gravey.  Pt. Had bouts of crying about 3 times during session but was able to be redirected.   Ambulated back over to sink and brushed teeth using RUE.  Demonstrated some difficutly with orgainizing toothbrushing activitiy, but was able with increased time.  Ambulated back  to bed.  Encouraged pt to sit up but she stated her pain was up.  Returned to bed and RN came in for meds     Therapy Documentation Precautions:  Precautions Precautions: Fall Precaution Comments: impulsivity with mobility, pain in L foot from ischemia, aphasic, right  inattention Restrictions Weight Bearing Restrictions: No    Pain:  Left foot pain 8/10- Nursing aware         See FIM for current functional status  Therapy/Group: Individual Therapy  Lisa Roca 03/18/2014, 7:44 AM

## 2014-03-19 ENCOUNTER — Encounter (HOSPITAL_COMMUNITY): Payer: Medicare Other | Admitting: Occupational Therapy

## 2014-03-19 ENCOUNTER — Inpatient Hospital Stay (HOSPITAL_COMMUNITY): Payer: Medicare Other

## 2014-03-19 ENCOUNTER — Inpatient Hospital Stay (HOSPITAL_COMMUNITY): Payer: Medicare Other | Admitting: Speech Pathology

## 2014-03-19 DIAGNOSIS — N179 Acute kidney failure, unspecified: Secondary | ICD-10-CM

## 2014-03-19 DIAGNOSIS — I999 Unspecified disorder of circulatory system: Secondary | ICD-10-CM

## 2014-03-19 DIAGNOSIS — Z5189 Encounter for other specified aftercare: Secondary | ICD-10-CM

## 2014-03-19 DIAGNOSIS — I70229 Atherosclerosis of native arteries of extremities with rest pain, unspecified extremity: Secondary | ICD-10-CM

## 2014-03-19 DIAGNOSIS — I6330 Cerebral infarction due to thrombosis of unspecified cerebral artery: Secondary | ICD-10-CM

## 2014-03-19 NOTE — Progress Notes (Signed)
Occupational Therapy Session Note  Patient Details  Name: Traci Mclaughlin MRN: [id number] Date of Birth: May 20, 1951  Today's Date: 03/19/2014 OT Individual Time: 1101-1150 OT Individual Time Calculation (min): 49 min   Short Term Goals: Week 1:  OT Short Term Goal 1 (Week 1): Grooming:  Min assist with 3 grooming tasks while standing at sink OT Short Term Goal 2 (Week 1): Self Feeding:  Min assist with AE PRN OT Short Term Goal 3 (Week 1): Bath: steady assist when standing portion of bathing OT Short Term Goal 4 (Week 1): LB Dressing: Min assist to include sit and stand OT Short Term Goal 5 (Week 1): Toileting: Steady assist while patient performs toileting tasks      Skilled Therapeutic Interventions/Progress Updates:      Pt seen for BADL retraining of toileting, bathing, and dressing with a focus on use of RUE, dynamic balance, safety awareness, and activity tolerance.  Pt was anxious to take a shower and was moving faster and more impulsively today. She had 2 small stumbles (no falls). One as she sat on the toilet and the 2nd as she was trying to stand to wash her feet. Pt needed continuous cues to sit to wash feet. Once she began shower, no c/o foot pain. Pt completed self care with close S.  Pt ambulated in room with RW with frequent cues to keep R hand on walker handle due to her R inattention. Pt then worked on [employer] and R side visual tracking with tracing shapes, figure cancellation. Pt was able to hold pen in R fingers to trace shapes partially. She had great difficulty fully tracing figure on R side of the shape. Pt worked on grip and pinch strengthening with soft theraputty. Pt completed exercises and was resting in recliner with call light in reach and QRB on.    Therapy Documentation Precautions:  Precautions Precautions: Fall Precaution Comments: impulsivity with mobility, pain in L foot from ischemia, aphasic, right inattention Restrictions Weight Bearing  Restrictions: No     Pain: Pain Assessment Pain Assessment: 0-10 Pain Score: 3  Faces Pain Scale: Hurts even more Pain Type: Acute pain Pain Location: Foot Pain Orientation: Left Pain Descriptors / Indicators: Aching Pain Frequency: Intermittent Pain Onset: On-going Pain Intervention(s): Medication (See eMAR);Repositioned Multiple Pain Sites: No  ADL:  See FIM for current functional status  Therapy/Group: Individual Therapy  Britt 03/19/2014, 12:50 PM

## 2014-03-19 NOTE — Progress Notes (Signed)
63 y.o. female with history of HTN, Hearing impaired, cervical cancer; who was admitted on 03/05/14 with right sided weakness, right facial droop and difficulty speaking X 1 day. Family was able to convince her to come to treat treatment and CT head revealed acute infarct superior left temporal lobe, portions of the posterior left frontal lobe, left extreme capsule and insular cortex with cytotoxic edema. MRI/MRA brain with acute large L-MCA infarct,   Dr. Scot Dock consulted for input on chronic foot pain as well as ischemic left 5th toe as well as some cyanosis along lateral aspect of her foot. Patient with markedly diminshed flow left foot as well as difficulty palpating left femoral vein. Arteriogram done showing total occlusion of left common iliac artery with collaterals and did not appear acute  Subjective/Complaints: Pt not initiating speech Comfortable in bed sleeping, awakens easily  Review of Systems - limited by hearing impairment and aphasia  Objective: Vital Signs: Blood pressure 144/83, pulse 75, temperature 98.9 F (37.2 C), temperature source Oral, resp. rate 18, weight 89.8 kg (197 lb 15.6 oz), SpO2 98.00%. No results found. No results found for this or any previous visit (from the past 72 hour(s)).   HEENT: Hearing impaired Cardio: RRR and no murmurs Resp: CTA B/L and unlabored GI: BS positive and NT,ND Extremity:  Pulses positive and No Edema Skin:   Intact and Other cyanosis left 5th toe-unchanged Neuro: Alert, needs gestural cues to follow commands and Abnormal Motor 4/5 on R side, 5/5 on left with exception of left toe flex/ext limited by pain Musc/Skel:  Normal and Extremity tender left 5th toe, no Left heel or midfoot pain, apprehensive when foot is examined Gen NAD   Assessment/Plan: 1. Functional deficits secondary to Left MCA infarct which require 3+ hours per day of interdisciplinary therapy in a comprehensive inpatient rehab setting. Physiatrist is providing  close team supervision and 24 hour management of active medical problems listed below. Physiatrist and rehab team continue to assess barriers to discharge/monitor patient progress toward functional and medical goals.  FIM: FIM - Bathing Bathing Steps Patient Completed: Chest;Right Arm;Left Arm;Abdomen;Buttocks;Front perineal area;Right upper leg;Left upper leg;Right lower leg (including foot);Left lower leg (including foot) Bathing: 4: Steadying assist  FIM - Upper Body Dressing/Undressing Upper body dressing/undressing steps patient completed: Thread/unthread right sleeve of pullover shirt/dresss;Thread/unthread left sleeve of pullover shirt/dress;Put head through opening of pull over shirt/dress;Pull shirt over trunk Upper body dressing/undressing: 5: Set-up assist to: Obtain clothing/put away FIM - Lower Body Dressing/Undressing Lower body dressing/undressing steps patient completed: Thread/unthread right pants leg;Thread/unthread left pants leg;Pull pants up/down Lower body dressing/undressing: 4: Steadying Assist  FIM - Toileting Toileting steps completed by patient: Adjust clothing prior to toileting;Performs perineal hygiene;Adjust clothing after toileting Toileting Assistive Devices: Grab bar or rail for support Toileting: 5: Supervision: Safety issues/verbal cues  FIM - Radio producer Devices: Grab bars;Walker Clinical cytogeneticist Transfers: 0-Activity did not occur  FIM - Engineer, site: Environmental consultant;Arm rests Bed/Chair Transfer: 4: Bed > Chair or W/C: Min A (steadying Pt. > 75%)  FIM - Locomotion: Wheelchair Distance: 50 Locomotion: Wheelchair: 0: Activity did not occur FIM - Locomotion: Ambulation Locomotion: Ambulation Assistive Devices: Administrator Ambulation/Gait Assistance: 4: Min guard Locomotion: Ambulation: 1: Travels less than 50 ft with minimal assistance (Pt.>75%)  Comprehension Comprehension Mode:  Visual Comprehension: 4-Understands basic 75 - 89% of the time/requires cueing 10 - 24% of the time  Expression Expression Mode: Verbal Expression Assistive Devices: 6-Communication board Expression: 2-Expresses  basic 25 - 49% of the time/requires cueing 50 - 75% of the time. Uses single words/gestures.  Social Interaction Social Interaction: 4-Interacts appropriately 75 - 89% of the time - Needs redirection for appropriate language or to initiate interaction.  Problem Solving Problem Solving: 3-Solves basic 50 - 74% of the time/requires cueing 25 - 49% of the time  Memory Memory: 3-Recognizes or recalls 50 - 74% of the time/requires cueing 25 - 49% of the time  Medical Problem List and Plan:   1. Functional deficits secondary to L-MCA infarct with right hemiparesis, aphasia as well as ischemia left foot.   2. DVT Prophylaxis/Anticoagulation: Pharmaceutical: Lovenox   3. Pain Management: Indicating severe pain left foot. Trial of oxycodone. Patient unable to ask for medication.   4. Mood: Labile and anxious in part due to pain as well as communication deficits. Will have LCSW follow for evaluation and support.   5. Neuropsych: This patient is not  yet capable of making decisions on her own behalf.   6. Skin/Wound Care: Pressure relief measures.   7. Chronic left iliac artery occlusion: Will order PRAFO for prevention of breakdown. Continue prn medications for pain control.               -Left foot pain will be an issue with weight bearing/activities with therapy.DARCO sandal ordered,         8.  UA - , Cult >100K proteus, probable colonization will not tx unless fever or symptomatic  LOS (Days) 7 A FACE TO FACE EVALUATION WAS PERFORMED  KIRSTEINS,ANDREW E 03/19/2014, 7:06 AM

## 2014-03-19 NOTE — Progress Notes (Signed)
Physical Therapy Weekly Progress Note  Patient Details  Name: Traci Mclaughlin MRN: [id number] Date of Birth: 1950-09-12  Beginning of progress report period: March 12, 2014 End of progress report period: March 19, 2014  Today's Date: 03/19/2014 PT Individual Time:0900-0920,  1205-1215, [id number] PT Individual Time Calculation (min): 20 min, 10 min , 38 min  General- pt missed 7 minutes of PT due to refusal/pain.  Patient has met 3 of 4 short term goals.  W/c propulsion has not been a focus of therapy.  Patient continues to demonstrate the following deficits: safety, balance, activity tolerance, pain , awareness, and therefore will continue to benefit from skilled PT intervention to enhance overall performance with activity tolerance, balance, postural control, ability to compensate for deficits, functional use of  right upper extremity, right lower extremity and left lower extremity and awareness.  Patient progressing toward long term goals..  Continue plan of care.  PT Short Term Goals Week 1:  PT Short Term Goal 1 (Week 1): Pt will perform stand pivot transfer at S level w/ LRAD PT Short Term Goal 1 - Progress (Week 1): Met PT Short Term Goal 2 (Week 1): Pt will perform dynamic standing activities at S level x 5 mins to increase independence with ADL's.  PT Short Term Goal 2 - Progress (Week 1): Met PT Short Term Goal 3 (Week 1): Pt will ambulate x 100' w/ LRAD at min/guard level with min cues for safety.  PT Short Term Goal 3 - Progress (Week 1): Met PT Short Term Goal 4 (Week 1): Pt will perform w/c mobility using most effective manner (likely LUE, RLE) x 100' at min A level.  PT Short Term Goal 4 - Progress (Week 1): Discontinued (comment) (no longer a focus of treatment )     Skilled Therapeutic Interventions/Progress Updates:  Tx 1:  Pt dozing in bed; easily aroused.  Pt initially agreeable to tx.  Pt in pain due to L toes ischemia- dusky color noted increased from last  week, especially 5th toe. She indicated her L toes hurt and she did not want to move, but slowly pt sat bedside and donned non slip socks modified independent.  RN reported pt was premedicated.  Gait in room to/from toilet with RW, supervision for safety.  Pt was unsafe with hand placement sit>< stand and unreceptive to multimodal cues to change.  Pt became agitated because she was focused on taking a shower.  Her schedule and explanation of PT were offered verbally and in written form.  Pt calmed down but was unwilling to participate unless she had a shower.  Pt is frustrated and in pain due to L toes.  PT consulted with CMSW regarding LOS/d/c plan which is unclear at this time.  Tx 2: Traci Mclaughlin here with Darco sandal for L foot.  Pt was receptive to using it, and performed gait in hall with RW x 25' x 1, and again 25' x 1 after soft insole was adjusted to minimize wt bearing through toes.  Pt indicated that someone would need to bring in a shoe for her right foot to attempt to equalize leg lengths when she is wearing the Darco sandal.  Tx 3:  Pt gestured that LLE hurt, unrated.  Premedicated.  Pt wearing L Darco sandal.  CMSW reported that pt's daughter will bring in a shoe for R foot tonight.  NuStep at level 4 for activity tolerance, x 10 minute, very slowly.  VCs and visual cues to continue motion.  Gait with RW x 156' x 2 with several self selected standing rest breaks toward ends of each walk, appearing due to LLE pain.  Pt tolerated standing with rolling table in front, x 6 minutes during neuromuscular re-education via manual and visual cues for bil hand task of manipulating small beads with R hand, and removing and replacing caps on Rx bottles. Pt's daughter is supposed to bring a shoe for her right foot tonight.    Therapy Documentation Precautions:  Precautions Precautions: Fall Precaution Comments: impulsivity with mobility, pain in L foot from ischemia, aphasic, right  inattention Restrictions Weight Bearing Restrictions: No      See FIM for current functional status  Therapy/Group: Individual Therapy  Kenichi Cassada 03/19/2014, 4:25 PM

## 2014-03-19 NOTE — Progress Notes (Signed)
Social Work Patient ID: Traci Mclaughlin, female   DOB: 25-Apr-1951, 63 y.o.   MRN: [id number] Spoke with Khalilah-daughter who reports situation with her sister and apoligetic regarding Friday visit with Mom.  Pt as of today is wanting to go to this daughter's and then to her own place, eventually. Asked for pt's right shoe and team conference Wed and may move up discharge date.  Daughter wants to be kept aware and will do what is needed for pt.  Will keep both daughter's updated due to no formal POA, but pa is capable of making her own decisions, both daughter's aware of this.  Will continue to work on discharge plans.

## 2014-03-19 NOTE — Progress Notes (Signed)
   VASCULAR SURGERY ASSESSMENT & PLAN:  * No change in exam of left foot. Foot has good temperature.   * CHRONIC LEFT ILIAC ARTERY OCCLUSION: The patient has chronic ischemia of the left lower extremity. Her best option for revascularization would be a right to left fem-fem bypass graft. However, given her recent stroke, and recent MI, she would be at very high risk for surgery. In addition, given her obesity, she would be at increased risk for wound healing problems and graft infection. Therefore, currently I would be very reluctant to consider revascularization unless the situation and her left foot worsened significantly. When she is discharged and follow this as an outpatient. If the foot worsens significantly, the least risky option would be a primary amputation.  * I will continue to follow.  SUBJECTIVE: Appears comfortable.  PHYSICAL EXAM: Filed Vitals:   03/18/14 1435 03/18/14 2041 03/19/14 0558 03/19/14 0622  BP: 149/67 142/84 163/90 144/83  Pulse: 73 77 82 75  Temp: 98.7 F (37.1 C) 99.1 F (37.3 C) 98.9 F (37.2 C)   TempSrc: Oral Oral Oral   Resp: 18 20 18    Weight:      SpO2: 98% 98% 98%    Discoloration of left 5th toe unchanged. Foot is warm  LABS: Lab Results  Component Value Date   WBC 8.1 03/13/2014   HGB 13.8 03/13/2014   HCT 40.1 03/13/2014   MCV 96.2 03/13/2014   PLT 248 03/13/2014   Lab Results  Component Value Date   CREATININE 1.20* 03/13/2014   Lab Results  Component Value Date   INR 1.07 03/05/2014   Active Problems:   CVA (cerebral infarction)  [name] Beeper: [phone] 03/19/2014

## 2014-03-19 NOTE — Progress Notes (Signed)
Speech Language Pathology Daily Session Note  Patient Details  Name: Traci Mclaughlin MRN: [id number] Date of Birth: [birth year]  Today's Date: 03/19/2014 SLP Individual Time: 1302-1402 SLP Individual Time Calculation (min): 60 min  Short Term Goals: Week 1: SLP Short Term Goal 1 (Week 1): Pt will improve verbal expression to complete automatic sequences for ~75% accuracy with mod-max assist  SLP Short Term Goal 2 (Week 1): Pt will improve functional communication via any modality (verbalization, gestures, commnuication board) to indicate needs/wants to staff for ~75% accuracy with mod-max assist  SLP Short Term Goal 3 (Week 1): Pt will tolerate trials of upgraded solid consistencies with no overt s/s of aspiration with supervision cues.   SLP Short Term Goal 4 (Week 1): Pt will follow 2-step directives during functional tasks for 75% accuracy with mod assist.   Skilled Therapeutic Interventions:  Pt was seen for skilled speech therapy targeting cognitive-linguistic goals.  Pt was upright in recliner upon arrival, asleep but easily arousable to voice and light touch.  Pt was noted with brighter affect today in comparison to previous therapy sessions and with overall improved functional communication via gestures and vocalizations.  SLP facilitated the session with a structured picture description task to elicit connected speech for functional communication with pt requiring overall min assist phonemic placement cues in addition to written aids to complete task for ~50-75% accuracy.  SLP further facilitated the session with a structured 4-step sequencing task targeting spontaneous use of functional communication, planning, thought organization, and error awareness with pt requiring min-mod assist multimodal cuing to complete task for >75% accuracy.  Pt requested pain medication at the end of the session.  Per RN, pt was not due for any more pain meds until later this afternoon.  Pt was agreeable to  repositioning in recliner to get more comfortable.  Pt was noted with spontaneous use of appropriate social responses to say "good bye" at the end of the session with no cuing needed.  Continue per current plan of care.    FIM:  Comprehension Comprehension Mode: Visual Comprehension: 5-Understands basic 90% of the time/requires cueing < 10% of the time Expression Expression Mode: Verbal Expression: 3-Expresses basic 50 - 74% of the time/requires cueing 25 - 50% of the time. Needs to repeat parts of sentences. Social Interaction Social Interaction: 4-Interacts appropriately 75 - 89% of the time - Needs redirection for appropriate language or to initiate interaction. Problem Solving Problem Solving: 4-Solves basic 75 - 89% of the time/requires cueing 10 - 24% of the time Memory Memory: 3-Recognizes or recalls 50 - 74% of the time/requires cueing 25 - 49% of the time  Pain Pain Assessment Pain Assessment: No/denies pain  Therapy/Group: Individual Therapy  Windell Moulding, M.A. CCC-SLP  [name], Selinda Orion 03/19/2014, 3:43 PM

## 2014-03-19 NOTE — Progress Notes (Signed)
Orthopedic Tech Progress Note Patient Details:  Kashia Brossard [birth year] [id number] Therapist called stating she had not seen [place] for patient. [facility] called to double check on order/delivery.  Patient ID: Rossi Silvestro, female   DOB: July 13, 1951, 63 y.o.   MRN: [id number]   Fenton Foy 03/19/2014, 9:38 AM

## 2014-03-20 ENCOUNTER — Encounter (HOSPITAL_COMMUNITY): Payer: Medicare Other | Admitting: Occupational Therapy

## 2014-03-20 ENCOUNTER — Inpatient Hospital Stay (HOSPITAL_COMMUNITY): Payer: Medicare Other | Admitting: Speech Pathology

## 2014-03-20 ENCOUNTER — Inpatient Hospital Stay (HOSPITAL_COMMUNITY): Payer: Medicare Other | Admitting: *Deleted

## 2014-03-20 DIAGNOSIS — N179 Acute kidney failure, unspecified: Secondary | ICD-10-CM

## 2014-03-20 DIAGNOSIS — I6330 Cerebral infarction due to thrombosis of unspecified cerebral artery: Secondary | ICD-10-CM

## 2014-03-20 DIAGNOSIS — I999 Unspecified disorder of circulatory system: Secondary | ICD-10-CM

## 2014-03-20 NOTE — Progress Notes (Signed)
63 y.o. female with history of HTN, Hearing impaired, cervical cancer; who was admitted on 03/05/14 with right sided weakness, right facial droop and difficulty speaking X 1 day. Family was able to convince her to come to treat treatment and CT head revealed acute infarct superior left temporal lobe, portions of the posterior left frontal lobe, left extreme capsule and insular cortex with cytotoxic edema. MRI/MRA brain with acute large L-MCA infarct,   Dr. Scot Dock consulted for input on chronic foot pain as well as ischemic left 5th toe as well as some cyanosis along lateral aspect of her foot. Patient with markedly diminshed flow left foot as well as difficulty palpating left femoral vein. Arteriogram done showing total occlusion of left common iliac artery with collaterals and did not appear acute  Subjective/Complaints: Per RN, need encouragement to take pain pills  Review of Systems - limited by hearing impairment and aphasia  Objective: Vital Signs: Blood pressure 133/88, pulse 75, temperature 98.9 F (37.2 C), temperature source Oral, resp. rate 18, weight 89.8 kg (197 lb 15.6 oz), SpO2 97.00%. No results found. No results found for this or any previous visit (from the past 72 hour(s)).   HEENT: Hearing impaired Cardio: RRR and no murmurs Resp: CTA B/L and unlabored GI: BS positive and NT,ND Extremity:  Pulses positive and No Edema Skin:   Intact and Other cyanosis left 5th toe-unchanged Neuro: Alert, needs gestural cues to follow commands and Abnormal Motor 4/5 on R side, 5/5 on left with exception of left toe flex/ext limited by pain Musc/Skel:  Normal and Extremity tender left 5th toe, no Left heel or midfoot pain, apprehensive when foot is examined Gen NAD   Assessment/Plan: 1. Functional deficits secondary to Left MCA infarct which require 3+ hours per day of interdisciplinary therapy in a comprehensive inpatient rehab setting. Physiatrist is providing close team supervision  and 24 hour management of active medical problems listed below. Physiatrist and rehab team continue to assess barriers to discharge/monitor patient progress toward functional and medical goals.  FIM: FIM - Bathing Bathing Steps Patient Completed: Chest;Right Arm;Left Arm;Abdomen;Buttocks;Front perineal area;Right upper leg;Left upper leg;Right lower leg (including foot);Left lower leg (including foot) Bathing: 4: Steadying assist  FIM - Upper Body Dressing/Undressing Upper body dressing/undressing steps patient completed: Thread/unthread right sleeve of pullover shirt/dresss;Thread/unthread left sleeve of pullover shirt/dress;Put head through opening of pull over shirt/dress;Pull shirt over trunk Upper body dressing/undressing: 5: Set-up assist to: Obtain clothing/put away FIM - Lower Body Dressing/Undressing Lower body dressing/undressing steps patient completed: Thread/unthread right pants leg;Thread/unthread left pants leg;Pull pants up/down;Don/Doff right sock;Don/Doff left sock Lower body dressing/undressing: 5: Supervision: Safety issues/verbal cues  FIM - Toileting Toileting steps completed by patient: Adjust clothing prior to toileting;Performs perineal hygiene;Adjust clothing after toileting Toileting Assistive Devices: Grab bar or rail for support Toileting: 5: Supervision: Safety issues/verbal cues  FIM - Radio producer Devices: Mining engineer Transfers: 5-To toilet/BSC: Supervision (verbal cues/safety issues);5-From toilet/BSC: Supervision (verbal cues/safety issues)  FIM - Control and instrumentation engineer Devices: Walker;Arm rests Bed/Chair Transfer: 6: More than reasonable amt of time;6: Sit > Supine: No assist;5: Bed > Chair or W/C: Supervision (verbal cues/safety issues);5: Chair or W/C > Bed: Supervision (verbal cues/safety issues)  FIM - Locomotion: Wheelchair Distance: 50 Locomotion: Wheelchair: 0: Activity did not  occur FIM - Locomotion: Ambulation Locomotion: Ambulation Assistive Devices: Administrator Ambulation/Gait Assistance: 5: Supervision Locomotion: Ambulation: 5: Travels 150 ft or more with supervision/safety issues  Comprehension Comprehension Mode: Visual Comprehension:  5-Understands basic 90% of the time/requires cueing < 10% of the time  Expression Expression Mode: Verbal Expression Assistive Devices: 6-Communication board Expression: 3-Expresses basic 50 - 74% of the time/requires cueing 25 - 50% of the time. Needs to repeat parts of sentences.  Social Interaction Social Interaction: 4-Interacts appropriately 75 - 89% of the time - Needs redirection for appropriate language or to initiate interaction.  Problem Solving Problem Solving: 4-Solves basic 75 - 89% of the time/requires cueing 10 - 24% of the time  Memory Memory: 3-Recognizes or recalls 50 - 74% of the time/requires cueing 25 - 49% of the time  Medical Problem List and Plan:   1. Functional deficits secondary to L-MCA infarct with right hemiparesis, aphasia as well as ischemia left foot.   2. DVT Prophylaxis/Anticoagulation: Pharmaceutical: Lovenox   3. Pain Management: Indicating severe pain left foot. Trial of oxycodone. Patient unable to ask for medication.   4. Mood: Labile and anxious in part due to pain as well as communication deficits. Will have LCSW follow for evaluation and support.   5. Neuropsych: This patient is not  yet capable of making decisions on her own behalf.   6. Skin/Wound Care: Pressure relief measures.   7. Chronic left iliac artery occlusion: Will order PRAFO for prevention of breakdown. Continue prn medications for pain control. Appreciate vascular note, pt at too high of risk for fem/fem bypass at the current time, if progressive ischemia/gangrene develops then BKA, but appearance of Left foot is stable, encourage pt to take analgesics              -Left foot pain will be an issue with  weight bearing/activities with therapy.DARCO sandal ordered,           LOS (Days) 8 A FACE TO FACE EVALUATION WAS PERFORMED  Nashea Chumney E 03/20/2014, 7:05 AM

## 2014-03-20 NOTE — Progress Notes (Addendum)
Physical Therapy Session Note  Patient Details  Name: Traci Mclaughlin MRN: [id number] Date of Birth: [birth year]  Today's Date: 03/20/2014 PT Individual Time: 0901-1001 PT Individual Time Calculation (min): 60 min   Short Term Goals: Week 1:  PT Short Term Goal 1 (Week 1): Pt will perform stand pivot transfer at S level w/ LRAD PT Short Term Goal 1 - Progress (Week 1): Met PT Short Term Goal 2 (Week 1): Pt will perform dynamic standing activities at S level x 5 mins to increase independence with ADL's.  PT Short Term Goal 2 - Progress (Week 1): Met PT Short Term Goal 3 (Week 1): Pt will ambulate x 100' w/ LRAD at min/guard level with min cues for safety.  PT Short Term Goal 3 - Progress (Week 1): Met PT Short Term Goal 4 (Week 1): Pt will perform w/c mobility using most effective manner (likely LUE, RLE) x 100' at min A level.  PT Short Term Goal 4 - Progress (Week 1): Discontinued (comment) (no longer a focus of treatment ) Week 2:     Skilled Therapeutic Interventions/Progress Updates:  Tx focused on activity tolerance, WC propulsion with hemi-technique, gait training with RW and Darco shoe, NMR for RUE, and functional mobility training. Family has not yet brought in shoe for RLE. Focus of gait today was short distances on varied settings to reduce pressure on LLE.  Pt quite emotional today and sad about likely loss of limb. Emotional support and education about outlook for individuals with residual limb improved her spirits considerably. Pt educated during seated rest breaks on prosthetics, gait, and rehab following amputation.   Tx initiated with RUE NMR and short periods 1-76mn x3 of standing at table for checkers. Pt becomes frustrated with slow speed of RUE manipulation, but continues to work at it.   Instructed pt in hemi WC propulsion with RLE and LUE x25' with Min A for steering needed. Pt not wanting to continue attempts at this time, agreeable to working on it at another time.    Took pt outside, which she enjoyed for short distance gait on varying surfaces furniture transfers, and LE therex in sitting with visual cues for technique. Pt needed close S for transfers and gait, but is moving well with Darco shoe.   Performed Nustep x6 min with bil UE and LE at slow pace with encouragement to continue, level 4.   Pt left in bed with all needs in reach. Feeling better overall.      Therapy Documentation Precautions:  Precautions Precautions: Fall Precaution Comments: impulsivity with mobility, pain in L foot from ischemia, aphasic, right inattention Restrictions Weight Bearing Restrictions: No    Pain: intermittent LLE, not rated, but pt able to continue tx with rests and modifications.     Locomotion : Ambulation Ambulation/Gait Assistance: 5: Supervision Wheelchair Mobility Distance: 25   See FIM for current functional status  Therapy/Group: Individual Therapy CKennieth Rad PT, DPT  03/20/2014, 10:09 AM

## 2014-03-20 NOTE — Progress Notes (Signed)
Speech Language Pathology Weekly Progress and Session Note  Patient Details  Name: Traci Mclaughlin MRN: [id number] Date of Birth: 1951-01-27  Beginning of progress report period: March 13, 2014 End of progress report period: March 20, 2014  Today's Date: 03/20/2014 SLP Individual Time: 1445-1545 SLP Individual Time Calculation (min): 60 min  Short Term Goals: Week 1: SLP Short Term Goal 1 (Week 1): Pt will improve verbal expression to complete automatic sequences for ~75% accuracy with mod-max assist  SLP Short Term Goal 1 - Progress (Week 1): Met SLP Short Term Goal 2 (Week 1): Pt will improve functional communication via any modality (verbalization, gestures, commnuication board) to indicate needs/wants to staff for ~75% accuracy with mod-max assist  SLP Short Term Goal 2 - Progress (Week 1): Met SLP Short Term Goal 3 (Week 1): Pt will tolerate trials of upgraded solid consistencies with no overt s/s of aspiration with supervision cues.   SLP Short Term Goal 3 - Progress (Week 1): Met SLP Short Term Goal 4 (Week 1): Pt will follow 2-step directives during functional tasks for 75% accuracy with mod assist.  SLP Short Term Goal 4 - Progress (Week 1): Met    New Short Term Goals: Week 2: SLP Short Term Goal 1 (Week 2): Pt will improve verbal expression to complete automatic sequences for ~75% accuracy with min-mod assist  SLP Short Term Goal 2 (Week 2): Pt will improve functional communication via any modality (verbalization, gestures, commnuication board) to indicate needs/wants to staff for ~75% accuracy with min-mod assist  SLP Short Term Goal 3 (Week 2): Pt will tolerate trials of upgraded regular consistencies with no overt s/s of aspiration with supervision cues.   SLP Short Term Goal 4 (Week 2): Pt will follow 2-step directives during functional tasks for 75% accuracy with min assist.   Weekly Progress Updates:  Pt made functional gains this reporting period and has met 4 out  of 4 short term goals due to improved comprehension for following directions, functional communication, and verbal expression.  Pt is currently at an overall min-mod level of assist for cognitive-linguistic tasks and is tolerating her upgraded diet of dys 3 solids and thin liquids with intermittent supervision for use of swallowing precautions to minimize overt s/s of aspiration and to monitor for pocketing.  Pt would continue to benefit from skilled speech services while inpatient in order to maximize functional independence and reduce burden of care upon discharge.  Pt and family education is ongoing.    Intensity: Minumum of 1-2 x/day, 30 to 90 minutes Frequency: 5 out of 7 days Duration/Length of Stay: 14-16 days  Treatment/Interventions: Patient/family education;Functional tasks;Dysphagia/aspiration precaution training;Cognitive remediation/compensation;Cueing hierarchy;Environmental controls;Internal/external aids;Multimodal communication approach;Speech/Language facilitation   Daily Session  Skilled Therapeutic Interventions: Pt was seen for skilled speech therapy targeting cognitive-linguistic goals.  Upon arrival, pt was asleep in bed, arousable to voice and light touch.  Pt required moderate encouragement to participate in ST due to lethargy; however, once transferred to wheelchair and transported to treatment room, pt participated in structured tasks with minimal encouragement.  SLP facilitated the session with min written and phonemic placement cues during a structured confrontational naming task during which the pt was >75% accurate for naming pictures of basic objects.  Furthermore, pt matched picture to phrase from a field of three with 100% accuracy and supervision assist.  Pt participated in a basic generative naming task via written or verbal responses with visual aids for >75% accuracy with mod assist.  Goals updated on  this date to reflect current progress and plan of care.          FIM:  Comprehension Comprehension Mode: Visual Comprehension: 5-Understands basic 90% of the time/requires cueing < 10% of the time Expression Expression Mode: Verbal Expression: 4-Expresses basic 75 - 89% of the time/requires cueing 10 - 24% of the time. Needs helper to occlude trach/needs to repeat words. Social Interaction Social Interaction: 5-Interacts appropriately 90% of the time - Needs monitoring or encouragement for participation or interaction. Problem Solving Problem Solving: 5-Solves basic 90% of the time/requires cueing < 10% of the time Memory Memory: 3-Recognizes or recalls 50 - 74% of the time/requires cueing 25 - 49% of the time  Pain Pain Assessment Pain Type: Acute pain Pain Location: Leg Pain Orientation: Left Pain Descriptors / Indicators: Aching Pain Intervention(s): RN made aware  Therapy/Group: Individual Therapy  Windell Moulding, M.A. CCC-SLP  Lakeeta Dobosz, Selinda Orion 03/20/2014, 4:20 PM

## 2014-03-20 NOTE — Progress Notes (Signed)
   VASCULAR SURGERY ASSESSMENT & PLAN:  * CHRONIC LEFT ILIAC ARTERY OCCLUSION: Her left foot remained stable. She does not appear to be in significant pain. I will be out of town at a meeting for the remainder of the week. If there are any problems you could consult the physician on call through our office [phone]). Once she is discharged, I will follow the left lower extremity as an outpatient.  SUBJECTIVE: Does not appear to be any significant pain.  PHYSICAL EXAM: Filed Vitals:   03/19/14 0622 03/19/14 1505 03/19/14 2011 03/20/14 0555  BP: 144/83 113/92 145/89 133/88  Pulse: 75 79 82 75  Temp:  98.6 F (37 C) 98.8 F (37.1 C) 98.9 F (37.2 C)  TempSrc:  Oral Oral Oral  Resp:  20 18 18   Weight:      SpO2:  98% 99% 97%   Her left foot is stable with no change in exam. The foot has good temperature. There is discoloration of the fifth toe and lateral foot which has not changed.  LABS: Lab Results  Component Value Date   WBC 8.1 03/13/2014   HGB 13.8 03/13/2014   HCT 40.1 03/13/2014   MCV 96.2 03/13/2014   PLT 248 03/13/2014   Lab Results  Component Value Date   CREATININE 1.20* 03/13/2014   Lab Results  Component Value Date   INR 1.07 03/05/2014   Active Problems:   CVA (cerebral infarction)  [name] Beeper: [phone] 03/20/2014

## 2014-03-20 NOTE — Progress Notes (Signed)
Occupational Therapy Weekly Progress Note  Patient Details  Name: Traci Mclaughlin MRN: [id number] Date of Birth: 1951/05/29  Beginning of progress report period: March 13, 2014 End of progress report period: March 20, 2014  Today's Date: 03/20/2014 OT Individual Time: 0800-0900 OT Individual Time Calculation (min): 60 min   Patient has met 5 of 5 short term goals.  Despite severe LLE pain, pt has made excellent progress with her AROM, strength, and coordination of RUE, balance, and activity tolerance.  Patient continues to demonstrate the following deficits: limited R grasp strength and fine motor coordination, R inattention, R hand motor impersistence and dynamic balance skills and therefore will continue to benefit from skilled OT intervention to enhance overall performance with BADL.  Patient progressing toward long term goals..  Continue plan of care.  OT Short Term Goals Week 1:  OT Short Term Goal 1 (Week 1): Grooming:  Min assist with 3 grooming tasks while standing at sink OT Short Term Goal 1 - Progress (Week 1): Met OT Short Term Goal 2 (Week 1): Self Feeding:  Min assist with AE PRN OT Short Term Goal 2 - Progress (Week 1): Met OT Short Term Goal 3 (Week 1): Bath: steady assist when standing portion of bathing OT Short Term Goal 3 - Progress (Week 1): Met OT Short Term Goal 4 (Week 1): LB Dressing: Min assist to include sit and stand OT Short Term Goal 4 - Progress (Week 1): Met OT Short Term Goal 5 (Week 1): Toileting: Steady assist while patient performs toileting tasks OT Short Term Goal 5 - Progress (Week 1): Met Week 2:  OT Short Term Goal 1 (Week 2): STGs = LTGs  Skilled Therapeutic Interventions/Progress Updates:      Pt seen for BADL retraining of toileting, bathing, and dressing with a focus on dynamic balance and use of RUE.  Pt was very emotional, crying and stating "its useless to try I dont have a left leg". Pt was able to refocus to work on her self care.  Pt used RW to ambulate in her room with frequent cues to maintain grasp on R walker handle. Pt demonstrated improved balance and use of her R hand to hold washcloth in the shower. Pt ambulated to recliner to complete dressing. She had more difficulty with her bra to fasten it. Used R hand 50% or more of the time to self feed. Pt is able to reach to the floor to pick up clothing and towels and stand at her sink to clean it for very simple homemaking tasks. The Darco boot place on L foot and pt ambulated to the gym to work on R visual scanning and R [facility] with reaching to the right to place and remove small pegs.  Pt worked on Public affairs consultant weighted ball and reaching arm forward and back.  Pt resting on mat waiting for next PT session.   Therapy Documentation Precautions:  Precautions Precautions: Fall Precaution Comments: impulsivity with mobility, pain in L foot from ischemia, aphasic, right inattention Restrictions Weight Bearing Restrictions: No   Pain: Pain Assessment Pain Assessment: Faces Faces Pain Scale: Hurts whole lot Pain Type: Acute pain Pain Location: Foot Pain Orientation: Left Pain Descriptors / Indicators: Aching Pain Onset: On-going Pain Intervention(s): Shower ADL:  See FIM for current functional status  Therapy/Group: Individual Therapy  [place] 03/20/2014, 11:29 AM

## 2014-03-21 ENCOUNTER — Encounter (HOSPITAL_COMMUNITY): Payer: Medicare Other | Admitting: Occupational Therapy

## 2014-03-21 ENCOUNTER — Inpatient Hospital Stay (HOSPITAL_COMMUNITY): Payer: Medicare Other | Admitting: Speech Pathology

## 2014-03-21 ENCOUNTER — Ambulatory Visit: Payer: Medicare Other

## 2014-03-21 ENCOUNTER — Inpatient Hospital Stay (HOSPITAL_COMMUNITY): Payer: Medicare Other

## 2014-03-21 ENCOUNTER — Inpatient Hospital Stay (HOSPITAL_COMMUNITY): Payer: Medicare Other | Admitting: Physical Therapy

## 2014-03-21 NOTE — Progress Notes (Signed)
Physical Therapy Session Note  Patient Details  Name: Traci Mclaughlin MRN: [id number] Date of Birth: 02-04-1951  Today's Date: 03/21/2014 PT Individual Time: 1550-1620 PT Individual Time Calculation (min): 30 min   Short Term Goals: Week 1:  PT Short Term Goal 1 (Week 1): Pt will perform stand pivot transfer at S level w/ LRAD PT Short Term Goal 1 - Progress (Week 1): Met PT Short Term Goal 2 (Week 1): Pt will perform dynamic standing activities at S level x 5 mins to increase independence with ADL's.  PT Short Term Goal 2 - Progress (Week 1): Met PT Short Term Goal 3 (Week 1): Pt will ambulate x 100' w/ LRAD at min/guard level with min cues for safety.  PT Short Term Goal 3 - Progress (Week 1): Met PT Short Term Goal 4 (Week 1): Pt will perform w/c mobility using most effective manner (likely LUE, RLE) x 100' at min A level.  PT Short Term Goal 4 - Progress (Week 1): Discontinued (comment) (no longer a focus of treatment ) Week 2:   STG= LTG  Skilled Therapeutic Interventions/Progress Updates:   Pt received semi reclined in bed, pre-medicated and no c/o pain. Pt transferred supine <> sit and donned/doffed shoe and DARCO seated EOB with supervision. Stand pivot transfer bed <> w/c, supervision. W/c mobility using BUEs with cues for technique, 40 ft with mod A. Pt negotiated up/down 5 stairs using 2 rails x 2 with seated rest between, initial min guard progressed to supervision. Pt performed car transfer by attempting to step straight into car with increased difficulty. Visual demonstration for sequencing to turn, sit, and then bring LEs into vehicle and effective return demonstration, supervision. Pt walked <10 ft without AD from w/c to/from car, supervision. Returned to room and left semi reclined in bed with all needs within reach.   Therapy Documentation Precautions:  Precautions Precautions: Fall Precaution Comments: impulsivity with mobility, pain in L foot from ischemia, aphasic,  right inattention Restrictions Weight Bearing Restrictions: No Vital Signs: Therapy Vitals Temp: 98.5 F (36.9 C) Temp src: Oral Pulse Rate: 76 Resp: 18 BP: 128/60 mmHg Patient Position (if appropriate): Sitting Oxygen Therapy SpO2: 98 % O2 Device: None (Room air)  See FIM for current functional status  Therapy/Group: Individual Therapy  Laretta Alstrom 03/21/2014, 4:34 PM

## 2014-03-21 NOTE — Patient Care Conference (Signed)
Inpatient RehabilitationTeam Conference and Plan of Care Update Date: 03/21/2014   Time: 10;40 AM    Patient Name: Traci Mclaughlin      Medical Record Number: [id number]  Date of Birth: [birth year] Sex: Female         Room/Bed: 4M07C/4M07C-01 Payor Info: Payor: [employer] MEDICARE / Plan: AARP MEDICARE COMPLETE / Product Type: *No Product type* /    Admitting Diagnosis: L CVA  Admit Date/Time:  03/12/2014  6:08 PM Admission Comments: No comment available   Primary Diagnosis:  <principal problem not specified> Principal Problem: <principal problem not specified>  Patient Active Problem List   Diagnosis Date Noted  . Elevated troponin 03/11/2014  . 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 Right hemiplegia 03/07/2014  . Dyslipidemia 03/07/2014  . Stroke 03/05/2014  . CKD (chronic kidney disease) stage 3, GFR 30-59 ml/min 03/05/2014  . Hypertension 03/05/2014  . CVA (cerebral infarction) 03/05/2014  . Elevation of cardiac enzymes 07/06/2011  . Cervical cancer 07/06/2011  . Viral gastroenteritis 07/06/2011  . Acute kidney injury 07/06/2011    Expected Discharge Date: Expected Discharge Date: 03/23/14  Team Members Present: Physician leading conference: Dr. Alysia Penna Social Worker Present: Ovidio Kin, LCSW Nurse Present: Heather Roberts, RN PT Present: Georjean Mode, PT;Blair Hobble, PT OT Present: Simonne Come, OT SLP Present: Windell Moulding, SLP PPS Coordinator present : Daiva Nakayama, RN, CRRN     Current Status/Progress Goal Weekly Team Focus  Medical   Left foot pain reluctant to take meds, aphasia improving  manage pain prior to d/c  therapy tolerance, d/c planning   Bowel/Bladder   contient of bowel and bladder  remain contient of bowel and bladder  toliet every 2 hours anwser call quickly    Swallow/Nutrition/ Hydration   dys 3 textures, thin liquids   Mod I with least restrictive diet   trials of advanced textures    ADL's   supervision with basic self care  supervision with basic self care and light house keeping  IADLs, RUE neuro re-ed, pt/family education   Mobility   S transfers and gait with RW/Darco shoe up to 150'. Min A short dist WC, Mod A stairs  supervision transfers and gait x 150', min assist up/down 4 steps with 1 rail  Safety, activity tolerance, stairs, strengthening, family training.    Communication   min-mod assist for functional communication via vocalizations, gestures, and facial expressions  min assist   confrontational naming, generative naming, improving functional communication    Safety/Cognition/ Behavioral Observations  mildly decreased safety awareness and problem solving  supervision   problem solving, safety awareness, carryover of communication strategies    Pain   left foot pain  pain controled with medications  monitor pain  and administer analgesics as ordered   Skin   intact  free of skin break down  assess skin every shift      *See Care Plan and progress notes for long and short-term goals.  Barriers to Discharge: see above    Possible Resolutions to Barriers:  move up d/c to Friday    Discharge Planning/Teaching Needs:  Home with daughter-[name] who can provide 24 hr supervision level.  Aware we may move up discharge date.      Team Discussion:  Reaching her goals sooner than expected.  Pain control better with her L-PFAO.  Upgraded diet to DYS 3.  Will do better in home environment than continuing to stay here-discharge Friday  Revisions to Treatment Plan:  Moved up discharge date to 8/28   Continued Need for Acute Rehabilitation Level of Care: The patient requires daily medical management by a physician with specialized training in physical medicine and rehabilitation for the following conditions: Daily medical management of patient stability for increased activity during participation in an intensive rehabilitation regime.: Yes Daily analysis of laboratory  values and/or radiology reports with any subsequent need for medication adjustment of medical intervention for : Neurological problems;Other  Omkar Stratmann, Gardiner Rhyme 03/21/2014, 2:16 PM

## 2014-03-21 NOTE — Progress Notes (Signed)
Physical Therapy Session Note  Patient Details  Name: Traci Mclaughlin MRN: [id number] Date of Birth: [birth year]  Today's Date: 03/21/2014 PT Individual Time: 0800-0905 PT Individual Time Calculation (min): 65 min   Short Term Goals: Week 2: - LTGS due to LOS    Skilled Therapeutic Interventions/Progress Updates:  Pt indicated L toes are painful, unrated. Pt declined leaving her room until she had had her shower.  Pt unable to don her socks and shoes sitting EOB, due to difficulties crossing bil LEs, and difficulty leaning over far enough to don them with feet on floor; pt barely places L toes on floor, impacting her sitting balance for this task.   Gait wearing L Darco sandal, R shoe, x 25' x 15' in room with RW, distant supervision, safely using her hands during sit>< supine.  Standing tolerance at sink for oral care and face washing, x 10 minutes, modified independent.  Therapeutic exercise performed with LE to increase strength for functional mobility: in sitting in recliner: long arc quad knee ext , hip flex with 2# ankle wt, L and R; bil hip abd against minimal resistance, unable to perfrom bil hip add due to pain elicited L medial thigh and lower leg. Isometric trunk flexion/ext:  5 x 2 each.  Rest breaks between sets of 5 apparently due to pain.   With numerous rest breaks and consistent use of white board to communicate with her, pt was more interactive, less impulsive and anxious this session.  Pt verbalized that she "hates to take pain medicine, but just will have to"; RN notified.   W/c propulsion in room using LUE, RLE x 20' with min assist. Due to communication problems, it is unclear whether pt would use a rental w/c effectively at home, or if home is accessible. Will revisit this.  She will d/c to daughter's home here in [place].  D/C date now set for Fri 8/28.  Pt chose to stay in w/c, quick release belt applied, and all needs left in place.    Therapy  Documentation Precautions:  Precautions Precautions: Fall Precaution Comments: impulsivity with mobility, pain in L foot from ischemia, aphasic, right inattention Restrictions Weight Bearing Restrictions: No        See FIM for current functional status  Therapy/Group: Individual Therapy  Traci Mclaughlin 03/21/2014, 11:04 AM

## 2014-03-21 NOTE — Progress Notes (Signed)
Speech Language Pathology Daily Session Note  Patient Details  Name: Traci Mclaughlin MRN: [id number] Date of Birth: 1950-12-16  Today's Date: 03/21/2014 SLP Individual Time: 1421-1506 SLP Individual Time Calculation (min): 45 min  Short Term Goals: Week 2: SLP Short Term Goal 1 (Week 2): Pt will improve verbal expression to complete automatic sequences for ~75% accuracy with min-mod assist  SLP Short Term Goal 2 (Week 2): Pt will improve functional communication via any modality (verbalization, gestures, commnuication board) to indicate needs/wants to staff for ~75% accuracy with min-mod assist  SLP Short Term Goal 3 (Week 2): Pt will tolerate trials of upgraded regular consistencies with no overt s/s of aspiration with supervision cues.   SLP Short Term Goal 4 (Week 2): Pt will follow 2-step directives during functional tasks for 75% accuracy with min assist.   Skilled Therapeutic Interventions:  Pt was seen for skilled speech therapy targeting cognitive-linguistic goals.  Upon arrival, pt was upright in wheelchair, awake, alert, and agreeable to participate in [name].  Pt recalled having a good physical therapy session this morning via vocalizations and gestures in response to guided questions from SLP with overall min assist. SLP facilitated session with intermittent visual/gestural cues during trials of answering written semi-complex yes/no questions for 100% accuracy.  Additionally, pt was >80% accurate for word finding during structured generative naming tasks with overall min assist visual cues.  Pt was noted with marked improvements for verbal expression in automatic sequences with pt exhibiting ~80% accuracy for word generation in connected speech with min assist written cues.  Pt updated with current goals, progress in therapy, and plan of care.  Continue per current plan of care.    FIM:  Comprehension Comprehension Mode: Visual Comprehension: 5-Understands basic 90% of the time/requires  cueing < 10% of the time Expression Expression Mode: Verbal Expression Assistive Devices: 6-Communication board Expression: 4-Expresses basic 75 - 89% of the time/requires cueing 10 - 24% of the time. Needs helper to occlude trach/needs to repeat words. Social Interaction Social Interaction: 5-Interacts appropriately 90% of the time - Needs monitoring or encouragement for participation or interaction. Problem Solving Problem Solving: 5-Solves basic 90% of the time/requires cueing < 10% of the time Memory Memory: 4-Recognizes or recalls 75 - 89% of the time/requires cueing 10 - 24% of the time  Pain Pain Assessment Faces Pain Scale: Hurts a little bit Pain Type: Acute pain Pain Location: Leg Pain Orientation: Left Pain Descriptors / Indicators: Aching Pain Intervention(s): Other (Comment) (declined intervention, reported that she had already had medication ) Multiple Pain Sites: No  Therapy/Group: Individual Therapy  Windell Moulding, M.A. CCC-SLP  Jalen Daluz, Selinda Orion 03/21/2014, 4:14 PM

## 2014-03-21 NOTE — Progress Notes (Signed)
63 y.o. female with history of HTN, Hearing impaired, cervical cancer; who was admitted on 03/05/14 with right sided weakness, right facial droop and difficulty speaking X 1 day. Family was able to convince her to come to treat treatment and CT head revealed acute infarct superior left temporal lobe, portions of the posterior left frontal lobe, left extreme capsule and insular cortex with cytotoxic edema. MRI/MRA brain with acute large L-MCA infarct,   Dr. Scot Dock consulted for input on chronic foot pain as well as ischemic left 5th toe as well as some cyanosis along lateral aspect of her foot. Patient with markedly diminshed flow left foot as well as difficulty palpating left femoral vein. Arteriogram done showing total occlusion of left common iliac artery with collaterals and did not appear acute  Subjective/Complaints: Per RN, need encouragement to take pain pillsPt has questions about LLE pain We discussed with aid of white board   Review of Systems - limited by hearing impairment and aphasia  Objective: Vital Signs: Blood pressure 140/77, pulse 79, temperature 98.8 F (37.1 C), temperature source Oral, resp. rate 18, weight 89.8 kg (197 lb 15.6 oz), SpO2 98.00%. No results found. No results found for this or any previous visit (from the past 72 hour(s)).   HEENT: Hearing impaired Cardio: RRR and no murmurs Resp: CTA B/L and unlabored GI: BS positive and NT,ND Extremity:  Pulses positive and No Edema Skin:   Intact and Other cyanosis left 5th toe-unchanged Neuro: Alert, needs gestural cues to follow commands and Abnormal Motor 4/5 on R side, 5/5 on left with exception of left toe flex/ext limited by pain Musc/Skel:  Normal and Extremity tender left 5th toe, no Left heel or midfoot pain, apprehensive when foot is examined Gen NAD   Assessment/Plan: 1. Functional deficits secondary to Left MCA infarct which require 3+ hours per day of interdisciplinary therapy in a comprehensive  inpatient rehab setting. Physiatrist is providing close team supervision and 24 hour management of active medical problems listed below. Physiatrist and rehab team continue to assess barriers to discharge/monitor patient progress toward functional and medical goals.  FIM: FIM - Bathing Bathing Steps Patient Completed: Chest;Right Arm;Left Arm;Abdomen;Buttocks;Front perineal area;Right upper leg;Left upper leg;Right lower leg (including foot);Left lower leg (including foot) Bathing: 5: Supervision: Safety issues/verbal cues  FIM - Upper Body Dressing/Undressing Upper body dressing/undressing steps patient completed: Thread/unthread right sleeve of pullover shirt/dresss;Thread/unthread left sleeve of pullover shirt/dress;Put head through opening of pull over shirt/dress;Pull shirt over trunk;Thread/unthread right bra strap;Thread/unthread left bra strap Upper body dressing/undressing: 4: Min-Patient completed 75 plus % of tasks FIM - Lower Body Dressing/Undressing Lower body dressing/undressing steps patient completed: Thread/unthread right pants leg;Thread/unthread left pants leg;Pull pants up/down;Don/Doff right sock;Don/Doff left sock Lower body dressing/undressing: 5: Supervision: Safety issues/verbal cues  FIM - Toileting Toileting steps completed by patient: Adjust clothing prior to toileting;Performs perineal hygiene;Adjust clothing after toileting Toileting Assistive Devices: Grab bar or rail for support Toileting: 5: Supervision: Safety issues/verbal cues  FIM - Radio producer Devices: Mining engineer Transfers: 5-To toilet/BSC: Supervision (verbal cues/safety issues);5-From toilet/BSC: Supervision (verbal cues/safety issues)  FIM - Control and instrumentation engineer Devices: Walker;Arm rests;Orthosis Bed/Chair Transfer: 6: Sit > Supine: No assist;5: Bed > Chair or W/C: Supervision (verbal cues/safety issues);5: Chair or W/C > Bed:  Supervision (verbal cues/safety issues)  FIM - Locomotion: Wheelchair Distance: 25 Locomotion: Wheelchair: 1: Travels less than 50 ft with minimal assistance (Pt.>75%) FIM - Locomotion: Ambulation Locomotion: Ambulation Assistive Devices: Walker - Rolling;Orthosis  Ambulation/Gait Assistance: 5: Supervision Locomotion: Ambulation: 1: Travels less than 50 ft with supervision/safety issues  Comprehension Comprehension Mode: Visual Comprehension: 5-Understands basic 90% of the time/requires cueing < 10% of the time  Expression Expression Mode: Verbal Expression Assistive Devices: 6-Communication board Expression: 4-Expresses basic 75 - 89% of the time/requires cueing 10 - 24% of the time. Needs helper to occlude trach/needs to repeat words.  Social Interaction Social Interaction: 5-Interacts appropriately 90% of the time - Needs monitoring or encouragement for participation or interaction.  Problem Solving Problem Solving: 5-Solves basic 90% of the time/requires cueing < 10% of the time  Memory Memory: 3-Recognizes or recalls 50 - 74% of the time/requires cueing 25 - 49% of the time  Medical Problem List and Plan:   1. Functional deficits secondary to L-MCA infarct with right hemiparesis, aphasia as well as ischemia left foot.   2. DVT Prophylaxis/Anticoagulation: Pharmaceutical: Lovenox   3. Pain Management: Indicating severe pain left foot. Trial of oxycodone. Patient unable to ask for medication.   4. Mood: Labile and anxious in part due to pain as well as communication deficits. Will have LCSW follow for evaluation and support.   5. Neuropsych: This patient is not  yet capable of making decisions on her own behalf.   6. Skin/Wound Care: Pressure relief measures.   7. Chronic left iliac artery occlusion: Will order PRAFO for prevention of breakdown. Continue prn medications for pain control. Appreciate vascular note, pt at too high of risk for fem/fem bypass at the current time, if  progressive ischemia/gangrene develops then BKA, but appearance of Left foot is stable, encourage pt to take analgesics              -Left foot pain will be an issue with weight bearing/activities with therapy.DARCO sandal ordered,  -pt appears to understand that no surgery is planned for near future but will need surgery on LLE in future, needs to take pain meds for now and limit wt bearing         LOS (Days) [place] E 03/21/2014, 7:49 AM

## 2014-03-21 NOTE — Progress Notes (Signed)
Social Work Patient ID: Traci Mclaughlin, female   DOB: 04-Jun-1951, 63 y.o.   MRN: [id number] Met with pt and spoke with daughter-Traci Mclaughlin to inform team conference and moving up pt's discharge date due to her progress, to 8/28. Both were pleased with the plans and agreeable to the discharge date.  Daughter to try to arrange a ride for Friday.  Will order DME and arrange follow up therapies.

## 2014-03-21 NOTE — Progress Notes (Signed)
Occupational Therapy Session Note  Patient Details  Name: Traci Mclaughlin MRN: [id number] Date of Birth: 1951/01/30  Today's Date: 03/21/2014 OT Individual Time: 1005-1105 OT Individual Time Calculation (min): 60 min   Short Term Goals: Week 1:  OT Short Term Goal 1 (Week 1): Grooming:  Min assist with 3 grooming tasks while standing at sink OT Short Term Goal 1 - Progress (Week 1): Met OT Short Term Goal 2 (Week 1): Self Feeding:  Min assist with AE PRN OT Short Term Goal 2 - Progress (Week 1): Met OT Short Term Goal 3 (Week 1): Bath: steady assist when standing portion of bathing OT Short Term Goal 3 - Progress (Week 1): Met OT Short Term Goal 4 (Week 1): LB Dressing: Min assist to include sit and stand OT Short Term Goal 4 - Progress (Week 1): Met OT Short Term Goal 5 (Week 1): Toileting: Steady assist while patient performs toileting tasks OT Short Term Goal 5 - Progress (Week 1): Met Week 2:  OT Short Term Goal 1 (Week 2): STGs = LTGs  Skilled Therapeutic Interventions/Progress Updates:    Pt seen for BADL retraining with a focus on functional mobility, activity tolerance, balance, and use of RUE. Pt performed all of her self care efficiently but carefully with no LOB, no cues needed to grasp walker handle with R hand, and supervision for safety.  She struggled with her L sock due to pain but was able to use B hands to don it. Pt then ambulated to ADL apt to practice tub bench transfers with S. She then walked to gym to work on 20 min of R arm/ hand strengthening activities using 2# dowel bar for sh and chest presses, elbow and wrist flex/ext, tricep pushups in sitting with hands on yoga blocks. Pt participates well in the therapy sessions, is tearful at times but refocuses well. Pt returned to room to sit in recliner with legs elevated and call light in reach.  Therapy Documentation Precautions:  Precautions Precautions: Fall Precaution Comments: impulsivity with mobility, pain  in L foot from ischemia, aphasic, right inattention Restrictions Weight Bearing Restrictions: No    Vital Signs: Therapy Vitals Pulse Rate: 89 BP: 134/90 mmHg Pain: Pain Assessment Pain Assessment: 0-10 Pain Score: 8  Pain Type: Chronic pain;Acute pain Pain Location: Leg Pain Orientation: Left Pain Descriptors / Indicators: Aching Pain Frequency: Intermittent Pain Onset: Gradual Patients Stated Pain Goal: 3 Pain Intervention(s): Medication (See eMAR);Repositioned;Emotional support Multiple Pain Sites: No  ADL:  See FIM for current functional status  Therapy/Group: Individual Therapy  [place] 03/21/2014, 11:16 AM

## 2014-03-22 ENCOUNTER — Encounter (HOSPITAL_COMMUNITY): Payer: Medicare Other | Admitting: Occupational Therapy

## 2014-03-22 ENCOUNTER — Inpatient Hospital Stay (HOSPITAL_COMMUNITY): Payer: Medicare Other

## 2014-03-22 ENCOUNTER — Inpatient Hospital Stay (HOSPITAL_COMMUNITY): Payer: Medicare Other | Admitting: Speech Pathology

## 2014-03-22 DIAGNOSIS — I999 Unspecified disorder of circulatory system: Secondary | ICD-10-CM

## 2014-03-22 DIAGNOSIS — N179 Acute kidney failure, unspecified: Secondary | ICD-10-CM

## 2014-03-22 DIAGNOSIS — I6330 Cerebral infarction due to thrombosis of unspecified cerebral artery: Secondary | ICD-10-CM

## 2014-03-22 MED ORDER — NAPHAZOLINE HCL 0.1 % OP SOLN
1.0000 [drp] | Freq: Four times a day (QID) | OPHTHALMIC | Status: DC | PRN
  Administered 2014-03-22 – 2014-03-23 (×3): 1 [drp] via OPHTHALMIC
  Filled 2014-03-22 (×2): qty 15

## 2014-03-22 MED ORDER — DOCUSATE SODIUM 100 MG PO CAPS
100.0000 mg | ORAL_CAPSULE | Freq: Two times a day (BID) | ORAL | Status: DC
  Administered 2014-03-22 – 2014-03-23 (×2): 100 mg via ORAL
  Filled 2014-03-22 (×5): qty 1

## 2014-03-22 NOTE — Progress Notes (Signed)
Social Work Patient ID: Traci Mclaughlin, female   DOB: 10-Mar-1951, 63 y.o.   MRN: [id number] Have spoken with daughter-[name] and pt with her communication board to discuss discharge needs and services.  Agreeable to home health follow up no preference. Have made a referral to [facility] for follow up along with tub bench and rolling walker, to be delivered to her room today or early tomorrow.  Pt is glad to be leaving  Here and going home.  Daughter aware will await for her to get here before going over discharge instructions, which will be sometime in the afternoon.

## 2014-03-22 NOTE — Progress Notes (Addendum)
Occupational Therapy Session Note  Patient Details  Name: Traci Mclaughlin MRN: [id number] Date of Birth: 05-May-1951  Today's Date: 03/22/2014 OT Individual Time: 0940-1040 OT Individual Time Calculation (min): 60 min   Short Term Goals: Week 1:  OT Short Term Goal 1 (Week 1): Grooming:  Min assist with 3 grooming tasks while standing at sink OT Short Term Goal 1 - Progress (Week 1): Met OT Short Term Goal 2 (Week 1): Self Feeding:  Min assist with AE PRN OT Short Term Goal 2 - Progress (Week 1): Met OT Short Term Goal 3 (Week 1): Bath: steady assist when standing portion of bathing OT Short Term Goal 3 - Progress (Week 1): Met OT Short Term Goal 4 (Week 1): LB Dressing: Min assist to include sit and stand OT Short Term Goal 4 - Progress (Week 1): Met OT Short Term Goal 5 (Week 1): Toileting: Steady assist while patient performs toileting tasks OT Short Term Goal 5 - Progress (Week 1): Met Week 2:  OT Short Term Goal 1 (Week 2): STGs = LTGs  Skilled Therapeutic Interventions/Progress Updates:      Pt seen for BADL retraining of toileting, bathing, and dressing with a focus on use of R hand, balance, safety awareness. Pt able to complete all self care with supervision safely and efficiently using R hand as a dominant assist.  Pt often only uses RW to go a distance in the room and then puts it to the side, takes a few steps without support and sits.  It has been difficult to have her use the RW 100% of the time, but she is safe when she transfers.    She then ambulated to gym (156 feet) with supervision with RW to work on a simple HEP of AROM for UEs and strengthening with 3# dowel bar. Pt worked on [place] coordination with folding pillow cases and placing case on pillow without difficulty.  She ambulated back to room to work on [facility] with tracing shapes with a built up pen and theraputty exercises. Pt requested to lay down in bed. Pt resting in bed with call light in reach and bed alarm  set.  Therapy Documentation Precautions:  Precautions Precautions: Fall Precaution Comments: occasional impulsivity with mobility, pain in L foot from ischemia, aphasic, right inattention Restrictions Weight Bearing Restrictions: No Other Position/Activity Restrictions: wears Darco sandal to unweight L toes    Pain: Pain Assessment Pain Assessment: Faces Pain Score: 3  Pain Type: Acute pain Pain Location: Leg Pain Orientation: Left Pain Descriptors / Indicators: Crying Pain Intervention(s): Rest ADL:  See FIM for current functional status  Therapy/Group: Individual Therapy  [place] 03/22/2014, 11:19 AM

## 2014-03-22 NOTE — Progress Notes (Signed)
Speech Language Pathology Daily Session Note  Patient Details  Name: Traci Mclaughlin MRN: [id number] Date of Birth: 27-Oct-1950  Today's Date: 03/22/2014 SLP Individual Time: 1301-1401 SLP Individual Time Calculation (min): 60 min  Short Term Goals: Week 2: SLP Short Term Goal 1 (Week 2): Pt will improve verbal expression to complete automatic sequences for ~75% accuracy with min-mod assist  SLP Short Term Goal 2 (Week 2): Pt will improve functional communication via any modality (verbalization, gestures, commnuication board) to indicate needs/wants to staff for ~75% accuracy with min-mod assist  SLP Short Term Goal 3 (Week 2): Pt will tolerate trials of upgraded regular consistencies with no overt s/s of aspiration with supervision cues.   SLP Short Term Goal 4 (Week 2): Pt will follow 2-step directives during functional tasks for 75% accuracy with min assist.   Skilled Therapeutic Interventions:  Pt was seen for skilled speech therapy targeting linguistic goals and ongoing education.  Upon arrival, pt was asleep in bed but was awakened easily to voice and light touch.   Pt has been exhibiting consistent improvements in her use of verbal expression to achieve functional communication; however, as pt increases her use of speech to communicate needs/wants (versus gestures, facial expressions, and pointing), suspect that she will be limited by her decreased speech intelligibility resulting from right oral-facial weakness and motor planning impairments.  Therefore, SLP initiated skilled education for oral motor exercises and compensatory strategies for speech intelligibility emphasizing increased vocal intensity and slow rate.  Pt returned demonstration of oral motor exercises with 1-2 step written directions and min assist visual cues for placement of articulators.  SLP also facilitated the session with generative naming tasks targeting functional word finding with pt requiring overall min assist  written semantic cues and intermittent written initial phoneme cues for >75% accuracy.  Continue per current plan of care.   FIM:  Comprehension Comprehension Mode: Visual Comprehension: 5-Understands basic 90% of the time/requires cueing < 10% of the time Expression Expression Mode: Verbal Expression: 4-Expresses basic 75 - 89% of the time/requires cueing 10 - 24% of the time. Needs helper to occlude trach/needs to repeat words. Social Interaction Social Interaction: 5-Interacts appropriately 90% of the time - Needs monitoring or encouragement for participation or interaction. Problem Solving Problem Solving: 5-Solves basic 90% of the time/requires cueing < 10% of the time Memory Memory: 4-Recognizes or recalls 75 - 89% of the time/requires cueing 10 - 24% of the time  Pain Pain Assessment Pain Assessment: No/denies pain  Therapy/Group: Individual Therapy  Windell Moulding, M.A. CCC-SLP  Jawanza Zambito, Selinda Orion 03/22/2014, 3:24 PM

## 2014-03-22 NOTE — Progress Notes (Signed)
63 y.o. female with history of HTN, Hearing impaired, cervical cancer; who was admitted on 03/05/14 with right sided weakness, right facial droop and difficulty speaking X 1 day. Family was able to convince her to come to treat treatment and CT head revealed acute infarct superior left temporal lobe, portions of the posterior left frontal lobe, left extreme capsule and insular cortex with cytotoxic edema. MRI/MRA brain with acute large L-MCA infarct,   Dr. Scot Dock consulted for input on chronic foot pain as well as ischemic left 5th toe as well as some cyanosis along lateral aspect of her foot. Patient with markedly diminshed flow left foot as well as difficulty palpating left femoral vein. Arteriogram done showing total occlusion of left common iliac artery with collaterals and did not appear acute  Subjective/Complaints: Straining with BM lst noc Per RN noticed blood R eye no eye trauma reported  Review of Systems - limited by hearing impairment and aphasia  Objective: Vital Signs: Blood pressure 146/72, pulse 91, temperature 98.5 F (36.9 C), temperature source Oral, resp. rate 18, weight 90.7 kg (199 lb 15.3 oz), SpO2 99.00%. No results found. No results found for this or any previous visit (from the past 72 hour(s)).   HEENT: Hearing impaired, Right eye subconjunctival hemorrhage medial to pupil Cardio: RRR and no murmurs Resp: CTA B/L and unlabored GI: BS positive and NT,ND Extremity:  Pulses positive and No Edema Skin:   Intact and Other cyanosis left 5th toe-unchanged Neuro: Alert, needs gestural cues to follow commands and Abnormal Motor 4/5 on R side, 5/5 on left with exception of left toe flex/ext limited by pain Musc/Skel:  Normal and Extremity tender left 5th toe, no Left heel or midfoot pain, apprehensive when foot is examined Gen NAD   Assessment/Plan: 1. Functional deficits secondary to Left MCA infarct which require 3+ hours per day of interdisciplinary therapy in a  comprehensive inpatient rehab setting. Physiatrist is providing close team supervision and 24 hour management of active medical problems listed below. Physiatrist and rehab team continue to assess barriers to discharge/monitor patient progress toward functional and medical goals.  FIM: FIM - Bathing Bathing Steps Patient Completed: Chest;Right Arm;Left Arm;Abdomen;Buttocks;Front perineal area;Right upper leg;Left upper leg;Right lower leg (including foot);Left lower leg (including foot) Bathing: 5: Supervision: Safety issues/verbal cues  FIM - Upper Body Dressing/Undressing Upper body dressing/undressing steps patient completed: Thread/unthread right sleeve of pullover shirt/dresss;Thread/unthread left sleeve of pullover shirt/dress;Put head through opening of pull over shirt/dress;Pull shirt over trunk;Thread/unthread right bra strap;Thread/unthread left bra strap;Hook/unhook bra Upper body dressing/undressing: 5: Supervision: Safety issues/verbal cues FIM - Lower Body Dressing/Undressing Lower body dressing/undressing steps patient completed: Thread/unthread right pants leg;Thread/unthread left pants leg;Pull pants up/down;Don/Doff right sock;Don/Doff left sock;Thread/unthread right underwear leg;Thread/unthread left underwear leg;Pull underwear up/down;Don/Doff right shoe;Fasten/unfasten right shoe Lower body dressing/undressing: 5: Supervision: Safety issues/verbal cues  FIM - Toileting Toileting steps completed by patient: Adjust clothing prior to toileting;Performs perineal hygiene;Adjust clothing after toileting Toileting Assistive Devices: Grab bar or rail for support Toileting: 5: Supervision: Safety issues/verbal cues  FIM - Radio producer Devices: Mining engineer Transfers: 5-To toilet/BSC: Supervision (verbal cues/safety issues);5-From toilet/BSC: Supervision (verbal cues/safety issues)  FIM - Control and instrumentation engineer  Devices: Copy: 5: Bed > Chair or W/C: Supervision (verbal cues/safety issues);6: Supine > Sit: No assist;5: Chair or W/C > Bed: Supervision (verbal cues/safety issues)  FIM - Locomotion: Wheelchair Distance: 40 Locomotion: Wheelchair: 1: Travels less than 50 ft with moderate assistance (Pt: 50 -  74%) FIM - Locomotion: Ambulation Locomotion: Ambulation Assistive Devices: Walker - Rolling Ambulation/Gait Assistance: 5: Supervision Locomotion: Ambulation: 1: Travels less than 50 ft with supervision/safety issues  Comprehension Comprehension Mode: Visual Comprehension: 5-Understands basic 90% of the time/requires cueing < 10% of the time  Expression Expression Mode: Verbal;Nonverbal Expression Assistive Devices: 6-Communication board Expression: 4-Expresses basic 75 - 89% of the time/requires cueing 10 - 24% of the time. Needs helper to occlude trach/needs to repeat words.  Social Interaction Social Interaction: 4-Interacts appropriately 75 - 89% of the time - Needs redirection for appropriate language or to initiate interaction.  Problem Solving Problem Solving: 5-Solves basic 90% of the time/requires cueing < 10% of the time  Memory Memory: 4-Recognizes or recalls 75 - 89% of the time/requires cueing 10 - 24% of the time  Medical Problem List and Plan:   1. Functional deficits secondary to L-MCA infarct with right hemiparesis, aphasia as well as ischemia left foot.   2. DVT Prophylaxis/Anticoagulation: Pharmaceutical: Lovenox will d/c with subconjunctival hemorrhage  3. Pain Management: Indicating severe pain left foot. Trial of oxycodone. Patient unable to ask for medication.   4. Mood: Labile and anxious in part due to pain as well as communication deficits. Will have LCSW follow for evaluation and support.   5. Neuropsych: This patient is not  yet capable of making decisions on her own behalf.   6. Skin/Wound Care: Pressure relief measures.   7. Chronic left  iliac artery occlusion: Will order PRAFO for prevention of breakdown. Continue prn medications for pain control. Appreciate vascular note, pt at too high of risk for fem/fem bypass at the current time, if progressive ischemia/gangrene develops then BKA, but appearance of Left foot is stable, encourage pt to take analgesics              -Left foot pain will be an issue with weight bearing/activities with therapy.DARCO sandal ordered,  -pt appears to understand that no surgery is planned for near future but will need surgery on LLE in future, needs to take pain meds for now and limit wt bearing       8.  subconjunctival hemorrhage, likely after prolonged straining with BM combined with multiple blood thinners- will d/c enoxaparin , cont ASA and Plavix for now, naphcon drops, coace instead of senna  LOS (Days) 10 A FACE TO FACE EVALUATION WAS PERFORMED  Traci Mclaughlin 03/22/2014, 7:13 AM

## 2014-03-22 NOTE — Progress Notes (Signed)
Occupational Therapy Discharge Summary  Patient Details  Name: Craig Wisnewski MRN: [id number] Date of Birth: [birth year]   Patient has met 53 of 10 long term goals due to improved activity tolerance, improved balance, ability to compensate for deficits, functional use of  RIGHT upper and RIGHT lower extremity, improved attention, improved awareness and improved coordination.  Patient to discharge at overall Supervision level.  Patient's care partner is independent to provide the necessary physical and cognitive assistance at discharge.    Reasons goals not met: n/a  Recommendation:  Patient will benefit from ongoing skilled OT services in home health setting to continue to advance functional skills in the area of BADL.  Equipment: tub bench  Reasons for discharge: treatment goals met  Patient/family agrees with progress made and goals achieved: Yes  OT Discharge Precautions/Restrictions  Precautions Precautions: Fall Precaution Comments: occasional impulsivity with mobility, pain in L foot from ischemia, aphasic, right inattention Restrictions Weight Bearing Restrictions: No Other Position/Activity Restrictions: wears Darco sandal to unweight L toes  ADL ADL ADL Comments: supervision overall Vision/Perception  Vision- History Baseline Vision/History: Wears glasses Wears Glasses: At all times  Cognition Overall Cognitive Status: Impaired/Different from baseline Arousal/Alertness: Awake/alert Orientation Level: Other (comment) (would not cooperate) Memory: Impaired Awareness: Impaired (decreased safety awareness at times, but improved) Behaviors: Lability;Poor frustration tolerance;Verbal agitation (due to poor communication) Safety/Judgment: Impaired Sensation Sensation Light Touch: Appears Intact Stereognosis: Appears Intact Hot/Cold: Appears Intact Proprioception: Appears Intact Coordination Gross Motor Movements are Fluid and Coordinated: No Fine Motor  Movements are Fluid and Coordinated: No Coordination and Movement Description: decreased [place] for writing skills, opening small containers Heel Shin Test: RLE speed, excursion, and acccuracy impaired Motor  Motor Motor: Hemiplegia Motor - Discharge Observations: difficult to assess due to aphasia Mobility  Bed Mobility Bed Mobility:  (modified independent for all) Transfers Sit to Stand: 5: Supervision  Trunk/Postural Assessment  Cervical Assessment Cervical Assessment: Within Functional Limits Thoracic Assessment Thoracic Assessment: Within Functional Limits Lumbar Assessment Lumbar Assessment: Within Functional Limits Lumbar Strength Overall Lumbar Strength Comments: posterior pelvic tilt in sitting Postural Control Postural Control: Deficits on evaluation Protective Responses: limited on RLE due to CVA, and on LLE due to ischemia/painful toes  Balance Static Sitting Balance Static Sitting - Level of Assistance: 7: Independent Dynamic Sitting Balance Dynamic Sitting - Level of Assistance: 7: Independent Static Standing Balance Static Standing - Level of Assistance: 5: Stand by assistance Dynamic Standing Balance Dynamic Standing - Level of Assistance: 5: Stand by assistance Extremity/Trunk Assessment RUE AROM (degrees) RUE Overall AROM Comments: WFL RUE PROM (degrees) RUE Overall PROM Comments: WFL RUE Strength RUE Overall Strength Comments: R shoulder 4/5, elbow 4+/5, and grasp 20 lbs (versus 40 lbs on L hand) LUE Assessment LUE Assessment: Within Functional Limits  See FIM for current functional status  Althea Backs 03/22/2014, 11:36 AM

## 2014-03-22 NOTE — Progress Notes (Addendum)
Physical Therapy Note  Patient Details  Name: Traci Mclaughlin MRN: [id number] Date of Birth: November 08, 1950 Today's Date: 03/22/2014  PT attempted to communicate with pt using compensatory strategies of white board and having her read lips, but pt became agitated and refused to participate.  She has broken a blood vessel in her R eye.  Also per RN, she had a suppository last night followed by 4 BMs through the night.  Pt c/o abdominal discomfort; RN aware.  60 min tx time missed.  Pt will not d/c until late afternoon tomorrow; family ed planned with daughter tomorrow PM.  Georjean Mode 03/22/2014, 8:23 AM

## 2014-03-22 NOTE — Plan of Care (Signed)
Problem: RH BOWEL ELIMINATION Goal: RH STG MANAGE BOWEL W/MEDICATION W/ASSISTANCE STG Manage Bowel with Medication with Mod I  Outcome: Progressing Pt received suppository on 8/26, had several cont BMs afterward

## 2014-03-23 ENCOUNTER — Encounter (HOSPITAL_COMMUNITY): Payer: Self-pay | Admitting: Speech Pathology

## 2014-03-23 ENCOUNTER — Ambulatory Visit (HOSPITAL_COMMUNITY): Payer: Self-pay | Admitting: *Deleted

## 2014-03-23 MED ORDER — ATORVASTATIN CALCIUM 40 MG PO TABS: 40.0000 mg | ORAL_TABLET | Freq: Every day | ORAL | Status: DC

## 2014-03-23 MED ORDER — AMLODIPINE BESYLATE 5 MG PO TABS: 5.0000 mg | ORAL_TABLET | Freq: Every day | ORAL | Status: DC

## 2014-03-23 MED ORDER — METOPROLOL TARTRATE 25 MG PO TABS: 12.5000 mg | ORAL_TABLET | Freq: Two times a day (BID) | ORAL | Status: DC

## 2014-03-23 MED ORDER — OXYCODONE HCL 10 MG PO TABS: 10.0000 mg | ORAL_TABLET | Freq: Four times a day (QID) | ORAL | Status: DC | PRN

## 2014-03-23 MED ORDER — DSS 100 MG PO CAPS: 100.0000 mg | ORAL_CAPSULE | Freq: Two times a day (BID) | ORAL | Status: DC

## 2014-03-23 MED ORDER — CLOPIDOGREL BISULFATE 75 MG PO TABS: 75.0000 mg | ORAL_TABLET | Freq: Every day | ORAL | Status: DC

## 2014-03-23 MED ORDER — ASPIRIN 81 MG PO CHEW: 81.0000 mg | CHEWABLE_TABLET | Freq: Every day | ORAL | Status: DC

## 2014-03-23 NOTE — Progress Notes (Signed)
Patient and family received discharge instructions from Algis Liming, PA-C with verbal understanding. Patient discharged to home with belongings and family.

## 2014-03-23 NOTE — Discharge Summary (Signed)
Physician Discharge Summary  Patient ID: Traci Mclaughlin MRN: [id number] DOB/AGE: [birth year] 63 y.o.  Admit date: 03/12/2014 Discharge date: 03/23/2014  Discharge Diagnoses:  Principal Problem:   CVA (cerebral infarction) Active Problems:   Hypertension   Dyslipidemia   Ischemic left 5th toe likely secondary to emboli   Discharged Condition: Stable   Labs:  Basic Metabolic Panel: BMET    Component Value Date/Time   NA 139 03/13/2014 0519   K 4.2 03/13/2014 0519   CL 102 03/13/2014 0519   CO2 22 03/13/2014 0519   GLUCOSE 92 03/13/2014 0519   BUN 19 03/13/2014 0519   CREATININE 1.20* 03/13/2014 0519   CALCIUM 9.4 03/13/2014 0519   GFRNONAA 47* 03/13/2014 0519   GFRAA 55* 03/13/2014 0519     CBC: CBC Latest Ref Rng 03/13/2014 03/09/2014 03/09/2014  WBC 4.0 - 10.5 K/uL 8.1 10.9(H) 8.5  Hemoglobin 12.0 - 15.0 g/dL 13.8 13.5 13.9  Hematocrit 36.0 - 46.0 % 40.1 39.5 40.9  Platelets 150 - 400 K/uL 248 221 218     CBG: No results found for this basename: GLUCAP,  in the last 168 hours  Brief HPI:   Traci Mclaughlin is a 63 y.o. female with history of HTN, Hearing impaired, cervical cancer; who was admitted on 03/05/14 with right sided weakness, right facial droop and difficulty speaking X 1 day. F MRI/MRA brain with acute large L-MCA infarct, remote L-PCA infarct and occluded left M2 superior branch c/w acute thromboembolic disease. UDS positive for THC. Patient with elevated cardiac enzymes and cardiology felt changes due to [facility] stroke.  Neurology feels that patient likely with thromboembolic stroke with question cardiac source and Loop recorder placed on 03/12/14. TEE without PFO or thrombus. Dr. Scot Dock consulted for input on chronic foot pain with  ischemic left 5th toe as well as some cyanosis along lateral aspect of her foot. arteriogram showed total occlusion of left common iliac artery with collaterals and did not appear acute.  He recommended options of  Right Fem-Fem BG with  concerns of poor healing or L-AKA if foot continues to have progressive changes. Patient with resultant right sided weakness, dysphagia due to impaired mastication as well as aphasia.  CIR was recommended for follow up therapy.     Hospital Course: Vasiliki Smaldone was admitted to rehab 03/12/2014 for inpatient therapies to consist of PT, ST and OT at least three hours five days a week. Past admission physiatrist, therapy team and rehab RN have worked together to provide customized collaborative inpatient rehab. She was maintained on ASA/plavix for secondary stroke prevention. She continued to have high levels of anxiety with lability due to as left foot pain and has had difficulty understanding concept of pain due to ischemia. Multiple attempts were made to explain situation to her by staff as well as family. She was started on oxycodone with improvement in pain management. Family was advised to monitor foot for coolness, increase in pain or onset of numbness and contact Dr. Scot Dock for follow up appointment. Blood pressures have been well controlled. Po intake has improved. She was started on bowel program to help with constipation issues.  She has made good progress and supervision is recommended due to aphasia, apraxia as well as impulsivity. She will continue to receive [place], OT, ST by [place] past discharge.   Rehab course: During patient's stay in rehab weekly team conferences were held to monitor patient's progress, set goals and discuss barriers to discharge.Patient has had improvement in  activity tolerance, balance, postural control, as well as ability to compensate for deficits.  She has had improvement in functional use RUE and RLE as well as improved awareness. She requires supervision to complete bathing and dressing tasks.  She is ambulating 150 feet with RW and supervision. She requires min assist to navigate 8 stairs. Speech therapy has worked on compensatory strategies and  has educated family on communication of people with aphasia emphasizing use of semantic and phonemic cues to maximize functional independence for communication. SLP reviewed and reinforced recommendations of dysphagia 3 diet with thin liquids and provided pt's daughter with a handout to facilitate carryover in the home environment. Family education was completed regarding supervision needed.     Disposition: 01-Home or Self Care  Diet: Cardiac diet. Soft textures.   Special Instructions: 1. Need to follow up Primary Care MD for post hospital check in two weeks.  2. No tobacco products.      Medication List    STOP taking these medications       enoxaparin 40 MG/0.4ML injection  Commonly known as:  LOVENOX     hydrALAZINE 20 MG/ML injection  Commonly known as:  APRESOLINE     HYDROcodone-acetaminophen 5-325 MG per tablet  Commonly known as:  NORCO/VICODIN     ondansetron 4 MG/2ML Soln injection  Commonly known as:  ZOFRAN      TAKE these medications       acetaminophen 325 MG tablet  Commonly known as:  TYLENOL  Take 2 tablets (650 mg total) by mouth every 4 (four) hours as needed for headache or mild pain.     amLODipine 5 MG tablet  Commonly known as:  NORVASC  Take 1 tablet (5 mg total) by mouth daily.     aspirin 81 MG chewable tablet  Chew 1 tablet (81 mg total) by mouth daily.     atorvastatin 40 MG tablet  Commonly known as:  LIPITOR  Take 1 tablet (40 mg total) by mouth daily at 6 PM.     clopidogrel 75 MG tablet  Commonly known as:  PLAVIX  Take 1 tablet (75 mg total) by mouth daily.     diclofenac sodium 1 % Gel  Commonly known as:  VOLTAREN  Apply 4 g topically 4 (four) times daily.     DSS 100 MG Caps  Take 100 mg by mouth 2 (two) times daily.     metoprolol tartrate 25 MG tablet  Commonly known as:  LOPRESSOR  Take 0.5 tablets (12.5 mg total) by mouth 2 (two) times daily.     Oxycodone HCl 10 MG Tabs--Rx # 90 pills  Take 1-1.5 tablets (10-15  mg total) by mouth every 6 (six) hours as needed for severe pain.     polyethylene glycol packet  Commonly known as:  MIRALAX / GLYCOLAX  Take 17 g by mouth daily.           Follow-up Information   Follow up with [facility] On 06/18/2014. (APPT @ 1;30 PM)    Contact information:   [place] [id number]       Follow up with Charlett Blake, MD On 04/23/2014. (Be there at 12:20 pm  for 1 pm  appointment )    Specialty:  Physical Medicine and Rehabilitation   Contact information:   [place] [address] [phone]       Follow up with DICKSON,CHRISTOPHER S, MD. Call today. (for  follow up on circulation left leg)    Specialty:  Vascular Surgery   Contact information:   [place] [address] [phone]       Follow up with Pixie Casino, MD. Call today. (for follow up appointmen/ cardiac work up. )    Specialty:  Cardiology   Contact information:   [place] [address] [phone]       Signed: Bary Leriche 03/23/2014, 6:54 PM

## 2014-03-23 NOTE — Progress Notes (Signed)
Speech Language Pathology Discharge Summary  Patient Details  Name: Traci Mclaughlin MRN: [id number] Date of Birth: Jan 18, 1951  Today's Date: 03/23/2014 SLP Individual Time: 3112-1624 SLP Individual Time Calculation (min): 27 min   Skilled Therapeutic Interventions:  Pt was seen for skilled speech therapy targeting family education prior to discharge.  Pt's daughter, [name] was present for the duration of the family training session.  SLP updated pt's daughter on pt's current goals and progress made while inpatient.  SLP also provided pt's daughter with skilled compensatory strategies for communication partners of people with aphasia emphasizing use of semantic and phonemic cues to maximize functional independence for communication.  SLP reviewed and reinforced recommendations related to pt's currently prescribed dys 3 diet with thin liquids and provided pt's daughter with a handout to facilitate carryover in the home environment. Pt's daughter informed of anticipated readiness for diet advancement at pt's comfort level, as pt has exhibited spontaneous and natural use of swallow precautions to minimize right buccal pocketing.  SLP recommended that pt have assistance for medication and financial management upon discharge in addition to 24/7 supervision for decreased safety awareness.  Pt's daughter agreeable to providing the recommended level of assistance.  All pt's and family's questions were answered at this time to their satisfaction.      Patient has met 5 of 5 long term goals.  Patient to discharge at overall Supervision;Min level.  Reasons goals not met: n/a   Clinical Impression/Discharge Summary:  Pt made functional gains while inpatient and is discharging having met 5 out of 5 long term goals due to improved expressive and receptive language skills, diet tolerance, problem solving, awareness, and memory.  Pt currently requires supervision-min assist to complete basic cognitive-linguistic  tasks and is tolerating her current diet of dys 3 solids and thin liquids with modified independence.  Pt may be advanced to regular solids at her comfort level as she has been noted with spontaneous use of compensatory swallowing strategies to manage right pocketing.  Given pt's history of profound hearing loss, pt benefits from lip reading and use of written cues to complete cognitive-linguistic tasks.  Pt would benefit from follow up speech therapy for aphasia, apraxia, dysarthria, and residual cognitive impairments characterized by decreased safety awareness, recall of complex information, and semi-complex problem solving.  Pt and family education is complete at this time.  Pt is to discharge home with her daughter, [name], where she will have 24/7 supervision and assistance for medication and financial management.    Care Partner:  Caregiver Able to Provide Assistance: Yes  Type of Caregiver Assistance: Physical;Cognitive  Recommendation:  Home Health SLP;24 hour supervision/assistance  Rationale for SLP Follow Up: Maximize functional communication;Maximize cognitive function and independence;Reduce caregiver burden   Equipment: none recommended by SLP    Reasons for discharge: Discharged from hospital   Patient/Family Agrees with Progress Made and Goals Achieved: Yes   See FIM for current functional status  Windell Moulding, M.A. CCC-SLP  Aneesh Faller, Selinda Orion 03/23/2014, 4:05 PM

## 2014-03-23 NOTE — Progress Notes (Signed)
Social Work Patient ID: Traci Mclaughlin, female   DOB: Aug 01, 1950, 63 y.o.   MRN: [id number] Daughter is here to go through education prior to [facility] discharge.  Therapy team aware.

## 2014-03-23 NOTE — Progress Notes (Signed)
Physical Therapy Session Note  Patient Details  Name: Traci Mclaughlin MRN: [id number] Date of Birth: [birth year]  Today's Date: 03/23/2014 PT Individual Time: 1430-1500 PT Individual Time Calculation (min): 30 min   Short Term Goals: Week 1:  PT Short Term Goal 1 (Week 1): Pt will perform stand pivot transfer at S level w/ LRAD PT Short Term Goal 1 - Progress (Week 1): Met PT Short Term Goal 2 (Week 1): Pt will perform dynamic standing activities at S level x 5 mins to increase independence with ADL's.  PT Short Term Goal 2 - Progress (Week 1): Met PT Short Term Goal 3 (Week 1): Pt will ambulate x 100' w/ LRAD at min/guard level with min cues for safety.  PT Short Term Goal 3 - Progress (Week 1): Met PT Short Term Goal 4 (Week 1): Pt will perform w/c mobility using most effective manner (likely LUE, RLE) x 100' at min A level.  PT Short Term Goal 4 - Progress (Week 1): Discontinued (comment) (no longer a focus of treatment ) Week 2:     Skilled Therapeutic Interventions/Progress Updates:  Tx focused on family training with daughter, who was lying in pt's bed upon arrival.  Instructed and demonstrated all aspects of mobility including gait with RW, stairs, basic transfers, and car transfer. Granddaughter able to return demonstrate these aspects of mobility with S except Min A for stairs with 1 rail/wall. Pt continues to demonstrate some impulsivity during mobility, especially when feeling over confident.  Demonstrated floor transfer and curb step transfer as pt too fatigued and not wanting to over work before DC home.  Explained importance of home safety, balance, and 24/7 S.  Pt/family have no further questions before DC home.      Therapy Documentation Precautions:  Precautions Precautions: Fall Precaution Comments: occasional impulsivity with mobility, pain in L foot from ischemia, aphasic, right inattention Restrictions Weight Bearing Restrictions: No Other Position/Activity  Restrictions: wears Darco sandal to unweight L toes General:   Vital Signs: Therapy Vitals Temp: 98.5 F (36.9 C) Temp src: Oral Pulse Rate: 91 Resp: 18 BP: 132/77 mmHg Patient Position (if appropriate): Sitting Oxygen Therapy SpO2: 98 % O2 Device: None (Room air) Pain: none reported    See FIM for current functional status  Therapy/Group: Individual Therapy Kennieth Rad, PT, DPT   03/23/2014, 3:46 PM

## 2014-03-23 NOTE — Progress Notes (Signed)
63 y.o. female with history of HTN, Hearing impaired, cervical cancer; who was admitted on 03/05/14 with right sided weakness, right facial droop and difficulty speaking X 1 day. Family was able to convince her to come to treat treatment and CT head revealed acute infarct superior left temporal lobe, portions of the posterior left frontal lobe, left extreme capsule and insular cortex with cytotoxic edema. MRI/MRA brain with acute large L-MCA infarct,   Dr. Scot Dock consulted for input on chronic foot pain as well as ischemic left 5th toe as well as some cyanosis along lateral aspect of her foot. Patient with markedly diminshed flow left foot as well as difficulty palpating left femoral vein. Arteriogram done showing total occlusion of left common iliac artery with collaterals and did not appear acute  Subjective/Complaints: Sleeping comfortable  Arouses easily to light touch  Review of Systems - limited by hearing impairment and aphasia  Objective: Vital Signs: Blood pressure 150/80, pulse 85, temperature 99.6 F (37.6 C), temperature source Oral, resp. rate 18, weight 90.7 kg (199 lb 15.3 oz), SpO2 97.00%. No results found. No results found for this or any previous visit (from the past 72 hour(s)).   HEENT: Hearing impaired, Right eye subconjunctival hemorrhage medial to pupil Cardio: RRR and no murmurs Resp: CTA B/L and unlabored GI: BS positive and NT,ND Extremity:  Pulses positive and No Edema Skin:   Intact and Other cyanosis left 5th toe-unchanged Neuro: Alert, needs gestural cues to follow commands and Abnormal Motor 4/5 on R side, 5/5 on left with exception of left toe flex/ext limited by pain Musc/Skel:  Normal and Extremity tender left 5th toe, no Left heel or midfoot pain, apprehensive when foot is examined Gen NAD   Assessment/Plan: 1. Functional deficits secondary to Left MCA infarct which require 3+ hours per day of interdisciplinary therapy in a comprehensive inpatient  rehab setting. Physiatrist is providing close team supervision and 24 hour management of active medical problems listed below. Physiatrist and rehab team continue to assess barriers to discharge/monitor patient progress toward functional and medical goals.  FIM: FIM - Bathing Bathing Steps Patient Completed: Chest;Right Arm;Left Arm;Abdomen;Buttocks;Front perineal area;Right upper leg;Left upper leg;Right lower leg (including foot);Left lower leg (including foot) Bathing: 5: Supervision: Safety issues/verbal cues  FIM - Upper Body Dressing/Undressing Upper body dressing/undressing steps patient completed: Thread/unthread right sleeve of pullover shirt/dresss;Thread/unthread left sleeve of pullover shirt/dress;Put head through opening of pull over shirt/dress;Pull shirt over trunk;Thread/unthread right bra strap;Thread/unthread left bra strap;Hook/unhook bra Upper body dressing/undressing: 5: Supervision: Safety issues/verbal cues FIM - Lower Body Dressing/Undressing Lower body dressing/undressing steps patient completed: Thread/unthread right pants leg;Thread/unthread left pants leg;Pull pants up/down;Don/Doff right sock;Don/Doff left sock;Thread/unthread right underwear leg;Thread/unthread left underwear leg;Pull underwear up/down;Don/Doff right shoe;Fasten/unfasten right shoe;Don/Doff left shoe Lower body dressing/undressing: 5: Set-up assist to: Don/Doff AFO/prosthesis/orthosis  FIM - Toileting Toileting steps completed by patient: Adjust clothing prior to toileting;Performs perineal hygiene;Adjust clothing after toileting Toileting Assistive Devices: Grab bar or rail for support Toileting: 5: Supervision: Safety issues/verbal cues  FIM - Radio producer Devices: Mining engineer Transfers: 5-To toilet/BSC: Supervision (verbal cues/safety issues);5-From toilet/BSC: Supervision (verbal cues/safety issues)  FIM - Buyer, retail Devices: Copy: 6: Supine > Sit: No assist;6: Sit > Supine: No assist;5: Bed > Chair or W/C: Supervision (verbal cues/safety issues);5: Chair or W/C > Bed: Supervision (verbal cues/safety issues)  FIM - Locomotion: Wheelchair Distance: 40 Locomotion: Wheelchair: 1: Travels less than 50 ft with moderate assistance (Pt:  50 - 74%) FIM - Locomotion: Ambulation Locomotion: Ambulation Assistive Devices: Walker - Rolling Ambulation/Gait Assistance: 5: Supervision Locomotion: Ambulation: 5: Travels 150 ft or more with supervision/safety issues  Comprehension Comprehension Mode: Visual Comprehension: 5-Understands basic 90% of the time/requires cueing < 10% of the time  Expression Expression Mode: Verbal Expression Assistive Devices: 6-Communication board Expression: 4-Expresses basic 75 - 89% of the time/requires cueing 10 - 24% of the time. Needs helper to occlude trach/needs to repeat words.  Social Interaction Social Interaction: 4-Interacts appropriately 75 - 89% of the time - Needs redirection for appropriate language or to initiate interaction.  Problem Solving Problem Solving: 5-Solves basic 90% of the time/requires cueing < 10% of the time  Memory Memory: 4-Recognizes or recalls 75 - 89% of the time/requires cueing 10 - 24% of the time  Medical Problem List and Plan:   1. Functional deficits secondary to L-MCA infarct with right hemiparesis, aphasia as well as ischemia left foot.   2. DVT Prophylaxis/Anticoagulation: Pharmaceutical: Lovenox will d/c with subconjunctival hemorrhage  3. Pain Management: Indicating severe pain left foot. Trial of oxycodone. Patient unable to ask for medication.   4. Mood: Labile and anxious in part due to pain as well as communication deficits. Will have LCSW follow for evaluation and support.   5. Neuropsych: This patient is not  yet capable of making decisions on her own behalf.   6. Skin/Wound Care: Pressure relief  measures.   7. Chronic left iliac artery occlusion: Will order PRAFO for prevention of breakdown. Continue prn medications for pain control. Appreciate vascular note, pt at too high of risk for fem/fem bypass at the current time, if progressive ischemia/gangrene develops then BKA, but appearance of Left foot is stable, encourage pt to take analgesics              -Left foot pain will be an issue with weight bearing/activities with therapy.DARCO sandal   -pt appears to understand that no surgery is planned for near future but will need surgery on LLE in future, needs to take pain meds for now and limit wt bearing       8.  subconjunctival hemorrhage, likely after prolonged straining with BM combined with multiple blood thinners- will d/c enoxaparin , cont ASA and Plavix for now, naphcon drops, colace instead of senna  LOS (Days) 11 A FACE TO FACE EVALUATION WAS PERFORMED  Ganesh Deeg E 03/23/2014, 7:11 AM

## 2014-03-23 NOTE — Discharge Planning (Signed)
Spoke to patients daughter concerning DME needs for her mother and she advised me that her mom already has walker and tub transfer bench.

## 2014-03-23 NOTE — Progress Notes (Signed)
Social Work Discharge Note Discharge Note  The overall goal for the admission was met for:   Discharge location: Yes-HOME WITH DAUGHTER WHO CAN PROVIDE SUPERVISION LEVEL-24 HR  Length of Stay: Yes-11 DAYS  Discharge activity level: Yes-SUPERVISION LEVEL  Home/community participation: Yes  Services provided included: MD, RD, PT, OT, SLP, RN, CM, TR, Pharmacy and SW  Financial Services: Private Insurance: [facility]  Follow-up services arranged: Home Health: [place] CARE-PT,OT,SP,RN, DME: [place] and Patient/Family has no preference for HH/DME agencies-CANCELLED W/C  Comments (or additional information):PT REACHED HER GOALS SOONER [place]. FAMILY EDUCATION  COMPLETED WHEN DAUGHTER ARRIVED PRIOR TO DISCHARGE.  AWARE HOW IMPORTANT FOLLOW UP WITH MD IS AND PLANS NOW TO GO TO MD. DAUGHTER DECLINED W/C WHEN DELIVERED DUE TO TOO HEAVY AND WOULD NOT FIT IN THE CAR TRUNK.   Patient/Family verbalized understanding of follow-up arrangements: Yes  Individual responsible for coordination of the follow-up plan: [facility]  Confirmed correct DME delivered: [name] 03/23/2014    [name]

## 2014-03-23 NOTE — Progress Notes (Signed)
Physical Therapy Discharge Summary  Patient Details  Name: Traci Mclaughlin MRN: [id number] Date of Birth: 02-Sep-1950  Today's Date: 03/23/2014  Patient has met 9 of 11 long term goals due to improved activity tolerance, improved balance, improved postural control, increased strength, decreased pain, ability to compensate for deficits and functional use of  right upper extremity and right lower extremity.  Patient to discharge at an ambulatory level Supervision.   Patient's care partner is independent to provide the necessary cognitive assistance at discharge.  Reasons goals not met: WC goals not met due to not a focus of tx as pt not anticipated to DC at [facility] level. Pt had difficulty coordinating WC with LUE and RLE due to impairments, and family declined WC for home/community use. Pt ambulatory at DC.    Recommendation:  Patient will benefit from ongoing skilled PT services in home health setting to continue to advance safe functional mobility, address ongoing impairments in weakness, balance, NMR, gait training, and minimize fall risk.  Equipment: No equipment provided - Family reports having RW at home.   Reasons for discharge: treatment goals met and discharge from hospital  Patient/family agrees with progress made and goals achieved: Yes  PT Discharge Precautions/Restrictions Precautions Precautions: Fall Precaution Comments: Periodically impulsive when feeling over confident Restrictions Weight Bearing Restrictions: No Other Position/Activity Restrictions: wears Darco sandal to unweight L toes Vital Signs Therapy Vitals Temp: 98.5 F (36.9 C) Temp src: Oral Pulse Rate: 91 Resp: 18 BP: 132/77 mmHg Patient Position (if appropriate): Sitting Oxygen Therapy SpO2: 98 % O2 Device: None (Room air) Pain Pain Assessment Pain Assessment: 0-10 Pain Score: 2  (Pre-medicate for travel to home) Pain Type: Acute pain Pain Location: Back Pain Orientation: Lower Pain Descriptors /  Indicators: Discomfort Pain Frequency: Intermittent Pain Onset: Gradual Patients Stated Pain Goal: 2 Pain Intervention(s): Medication (See eMAR) Vision/Perception  Vision - History Baseline Vision: Other (comment) (Pt reports not being able to see, but no glasses) Patient Visual Report: No change from baseline Vision - Assessment Eye Alignment: Within Functional Limits Perception Perception: Within Functional Limits Praxis Praxis: Intact  Cognition Overall Cognitive Status: Impaired/Different from baseline Arousal/Alertness: Awake/alert Orientation Level: Oriented X4 Attention: Alternating Alternating Attention: Appears intact Memory: Impaired Memory Impairment: Decreased recall of new information Awareness: Impaired Awareness Impairment: Anticipatory impairment Problem Solving: Impaired Problem Solving Impairment: Functional complex Behaviors: Lability;Poor frustration tolerance;Verbal agitation Safety/Judgment: Impaired Comments: Pt somewhat impulsive Sensation Sensation Light Touch: Appears Intact Stereognosis: Appears Intact Hot/Cold: Appears Intact Proprioception: Appears Intact Coordination Gross Motor Movements are Fluid and Coordinated: No Fine Motor Movements are Fluid and Coordinated: No Heel Shin Test: RLE speed, excursion, and acccuracy impaired Motor  Motor Motor: Hemiplegia Motor - Discharge Observations: Difficult to assess due to aphasia, but functional overall. RUE decreased most significantly; RLE fatigues easily  Mobility Bed Mobility Bed Mobility: Supine to Sit;Sit to Supine Supine to Sit: 6: Modified independent (Device/Increase time) Transfers Transfers: Yes Sit to Stand: 5: Supervision Sit to Stand Details (indicate cue type and reason): Safety cues to slow down and use RW Locomotion  Ambulation Ambulation: Yes Ambulation/Gait Assistance: 5: Supervision Ambulation Distance (Feet): 150 Feet Assistive device: Rolling  walker Ambulation/Gait Assistance Details: Safety cues for straight path and to consistently use RW. Darco shoe impairs gait pattern Stairs / Additional Locomotion Stairs: Yes Stairs Assistance: 4: Min assist;5: Supervision Stairs Assistance Details (indicate cue type and reason): Cues for step-to pattern Stair Management Technique: Two rails;One rail Left;Step to pattern Number of Stairs: 8 Height of Stairs:  7 Wheelchair Mobility Wheelchair Mobility: Yes Wheelchair Assistance: 3: Building surveyor Details: Tactile cues for initiation;Visual cues/gestures for sequencing;Verbal cues for sequencing;Verbal cues for technique;Verbal cues for precautions/safety;Verbal cues for safe use of DME/AE Wheelchair Propulsion: Left upper extremity;Right lower extremity Wheelchair Parts Management: Needs assistance Distance: 40  Trunk/Postural Assessment  Cervical Assessment Cervical Assessment: Within Functional Limits Thoracic Assessment Thoracic Assessment: Within Functional Limits Lumbar Assessment Lumbar Assessment: Within Functional Limits Lumbar Strength Overall Lumbar Strength Comments: posterior pelvic tilt in sitting Postural Control Postural Control: Deficits on evaluation Protective Responses: limited on RLE due to CVA, and on LLE due to ischemia/painful toes Postural Limitations: Pt tends to compensate for movements with posterior or R lateral lean.   Balance Balance Balance Assessed: Yes Static Sitting Balance Static Sitting - Balance Support: Feet supported;Left upper extremity supported Static Sitting - Level of Assistance: 7: Independent Dynamic Sitting Balance Dynamic Sitting - Balance Support: Feet supported;No upper extremity supported Dynamic Sitting - Level of Assistance: 7: Independent Static Standing Balance Static Standing - Balance Support: Left upper extremity supported Static Standing - Level of Assistance: 5: Stand by assistance Dynamic Standing  Balance Dynamic Standing - Balance Support: No upper extremity supported Dynamic Standing - Level of Assistance: 5: Stand by assistance Dynamic Standing - Balance Activities: Other (comment) (standing while manipulating Rx bottles/caps with bil hands, ) Extremity Assessment  RUE Assessment RUE Assessment: Exceptions to [facility] RUE AROM (degrees) RUE Overall AROM Comments: WFL RUE PROM (degrees) RUE Overall PROM Comments: WFL RUE Strength RUE Overall Strength Comments: R shoulder 4/5, elbow 4+/5, and grasp 20 lbs (versus 40 lbs on L hand) LUE Assessment LUE Assessment: Within Functional Limits RLE Assessment RLE Assessment: Exceptions to [facility] RLE Strength RLE Overall Strength: Deficits RLE Overall Strength Comments: Grossly 3+/5 R hip flex, knee ext 4/5, knee flex 3+/5, DF/PF 4/5 LLE Assessment LLE Assessment: Within Functional Limits (weak hip flexors)  See FIM for current functional status  Dennies Coate, [place], PT, DPT  03/23/2014, 4:20 PM

## 2014-03-23 NOTE — Discharge Instructions (Addendum)
Inpatient Rehab Discharge Instructions  Traci Mclaughlin Discharge date and time:  03/23/14   Activities/Precautions/ Functional Status: Activity: activity as tolerated Diet: cardiac diet Wound Care: none needed  Functional status:  ___ No restrictions     ___ Walk up steps independently _X__ 24/7 supervision/assistance   ___ Walk up steps with assistance ___ Intermittent supervision/assistance  ___ Bathe/dress independently ___ Walk with walker     _X__ Bathe/dress with assistance ___ Walk Independently    ___ Shower independently ___ Walk with assistance    ___ Shower with assistance _X__ No alcohol     ___ Return to work/school ________  Special Instructions: 1. Need to follow up Primary Care MD for post hospital check in two weeks.  2. No tobacco products.   COMMUNITY REFERRALS UPON DISCHARGE:    Home Health:   PT,  OT, SP, RN,   [place] JGOTL:[phone] Date of last service:03/23/2014  Medical Equipment/Items Ordered: DECLINED W/C DUE TO WEIGHT AND SIZE OF CAR TRUNK HAS ROLLING WALKER AND TUB BENCH AT HOME ALREADY     GENERAL COMMUNITY RESOURCES FOR PATIENT/FAMILY: Support Groups:[facility] SUPPORT GROUP   STROKE/TIA DISCHARGE INSTRUCTIONS SMOKING Cigarette smoking nearly doubles your risk of having a stroke & is the single most alterable risk factor  If you smoke or have smoked in the last 12 months, you are advised to quit smoking for your health.  Most of the excess cardiovascular risk related to smoking disappears within a year of stopping.  Ask you doctor about anti-smoking medications  [place] Quit Line: 1-800-QUIT NOW  Free Smoking Cessation Classes [phone]  CHOLESTEROL Know your levels; limit fat & cholesterol in your diet  Lipid Panel     Component Value Date/Time   CHOL 191 03/05/2014 2240   TRIG 106 03/05/2014 2240   HDL 48 03/05/2014 2240   CHOLHDL 4.0 03/05/2014 2240   VLDL 21 03/05/2014 2240   LDLCALC 122* 03/05/2014 2240      Many  patients benefit from treatment even if their cholesterol is at goal.  Goal: Total Cholesterol (CHOL) less than 160  Goal:  Triglycerides (TRIG) less than 150  Goal:  HDL greater than 40  Goal:  LDL (LDLCALC) less than 100   BLOOD PRESSURE American Stroke Association blood pressure target is less that 120/80 mm/Hg  Your discharge blood pressure is:  BP: 150/80 mmHg  Monitor your blood pressure  Limit your salt and alcohol intake  Many individuals will require more than one medication for high blood pressure  DIABETES (A1c is a blood sugar average for last 3 months) Goal HGBA1c is under 7% (HBGA1c is blood sugar average for last 3 months)  Diabetes:     Lab Results  Component Value Date   HGBA1C 5.8* 03/05/2014     Your HGBA1c can be lowered with medications, healthy diet, and exercise.  Check your blood sugar as directed by your physician  Call your physician if you experience unexplained or low blood sugars.  PHYSICAL ACTIVITY/REHABILITATION Goal is 30 minutes at least 4 days per week  Activity: No driving, Therapies: See above Return to work: N/A  Activity decreases your risk of heart attack and stroke and makes your heart stronger.  It helps control your weight and blood pressure; helps you relax and can improve your mood.  Participate in a regular exercise program.  Talk with your doctor about the best form of exercise for you (dancing, walking, swimming, cycling).  DIET/WEIGHT Goal is to maintain a healthy  weight  Your discharge diet is: Dysphagia thin liquids Your height is:   Your current weight is: Weight: 90.7 kg (199 lb 15.3 oz) Your Body Mass Index (BMI) is:    Following the type of diet specifically designed for you will help prevent another stroke.  Your goal weight is:    Your goal Body Mass Index (BMI) is 19-24.  Healthy food habits can help reduce 3 risk factors for stroke:  High cholesterol, hypertension, and excess weight.  RESOURCES Stroke/Support  Group:  Call [phone]   STROKE EDUCATION PROVIDED/REVIEWED AND GIVEN TO PATIENT Stroke warning signs and symptoms How to activate emergency medical system (call 911). Medications prescribed at discharge. Need for follow-up after discharge. Personal risk factors for stroke. Pneumonia vaccine given:  Flu vaccine given:  My questions have been answered, the writing is legible, and I understand these instructions.  I will adhere to these goals & educational materials that have been provided to me after my discharge from the hospital.       My questions have been answered and I understand these instructions. I will adhere to these goals and the provided educational materials after my discharge from the hospital.  Patient/Caregiver Signature _______________________________ Date __________  Clinician Signature _______________________________________ Date __________  Please bring this form and your medication list with you to all your follow-up doctor's appointments.

## 2014-03-23 NOTE — Progress Notes (Signed)
Social Work Patient ID: Traci Mclaughlin, female   DOB: September 22, 1950, 63 y.o.   MRN: [id number] Spoke with Khalilah-daughter who doe shave a rolling walker and tub bench for pt to use.  She does want a rental wheelchair due to pt not walking very far and her pain issues  Limit her mobility.  She is trying to get a ride here for the 2;00 pm family education.  She is awaiting her son to call her back regarding time he can bring her here.

## 2014-03-23 NOTE — Progress Notes (Signed)
Patient's daughter states that the home is not wheelchair accessible so they will not be able to take the wheelchair provided from [place].

## 2014-03-24 ENCOUNTER — Telehealth: Payer: Self-pay | Admitting: Internal Medicine

## 2014-03-24 MED ORDER — ONDANSETRON HCL 4 MG PO TABS: 4.0000 mg | ORAL_TABLET | Freq: Three times a day (TID) | ORAL | Status: DC | PRN

## 2014-03-24 NOTE — Telephone Encounter (Signed)
Needs Zofran called in PO (not IV - error) per [facility] RN Will do

## 2014-03-28 ENCOUNTER — Encounter: Payer: Self-pay | Admitting: *Deleted

## 2014-03-29 ENCOUNTER — Telehealth: Payer: Self-pay

## 2014-03-29 NOTE — Telephone Encounter (Signed)
Heather returned call. Informed her to inform patient's PCP about the redness and pain in right eye.

## 2014-03-29 NOTE — Telephone Encounter (Signed)
Heather @ [facility] called to report patient is having right eye redness and pain. Attempted to contact [facility]. Left a voicemail to return call to clinic.

## 2014-03-31 ENCOUNTER — Encounter (HOSPITAL_COMMUNITY): Payer: Self-pay | Admitting: Emergency Medicine

## 2014-03-31 ENCOUNTER — Emergency Department (HOSPITAL_COMMUNITY)
Admission: EM | Admit: 2014-03-31 | Discharge: 2014-04-01 | Disposition: A | Discharge status: Home / Self Care | Payer: Medicare Other | Attending: Emergency Medicine | Admitting: Emergency Medicine

## 2014-03-31 DIAGNOSIS — E119 Type 2 diabetes mellitus without complications: Secondary | ICD-10-CM | POA: Insufficient documentation

## 2014-03-31 DIAGNOSIS — Z79899 Other long term (current) drug therapy: Secondary | ICD-10-CM | POA: Insufficient documentation

## 2014-03-31 DIAGNOSIS — Z859 Personal history of malignant neoplasm, unspecified: Secondary | ICD-10-CM | POA: Insufficient documentation

## 2014-03-31 DIAGNOSIS — Z7982 Long term (current) use of aspirin: Secondary | ICD-10-CM | POA: Diagnosis not present

## 2014-03-31 DIAGNOSIS — I10 Essential (primary) hypertension: Secondary | ICD-10-CM | POA: Diagnosis not present

## 2014-03-31 DIAGNOSIS — L03116 Cellulitis of left lower limb: Secondary | ICD-10-CM

## 2014-03-31 DIAGNOSIS — Z8673 Personal history of transient ischemic attack (TIA), and cerebral infarction without residual deficits: Secondary | ICD-10-CM | POA: Insufficient documentation

## 2014-03-31 DIAGNOSIS — Z7901 Long term (current) use of anticoagulants: Secondary | ICD-10-CM | POA: Diagnosis not present

## 2014-03-31 DIAGNOSIS — Z87448 Personal history of other diseases of urinary system: Secondary | ICD-10-CM | POA: Insufficient documentation

## 2014-03-31 DIAGNOSIS — M7989 Other specified soft tissue disorders: Secondary | ICD-10-CM | POA: Diagnosis present

## 2014-03-31 DIAGNOSIS — Z87891 Personal history of nicotine dependence: Secondary | ICD-10-CM | POA: Insufficient documentation

## 2014-03-31 DIAGNOSIS — I82409 Acute embolism and thrombosis of unspecified deep veins of unspecified lower extremity: Secondary | ICD-10-CM | POA: Diagnosis not present

## 2014-03-31 DIAGNOSIS — Z792 Long term (current) use of antibiotics: Secondary | ICD-10-CM | POA: Insufficient documentation

## 2014-03-31 DIAGNOSIS — L02419 Cutaneous abscess of limb, unspecified: Secondary | ICD-10-CM | POA: Diagnosis not present

## 2014-03-31 DIAGNOSIS — Z9889 Other specified postprocedural states: Secondary | ICD-10-CM | POA: Insufficient documentation

## 2014-03-31 DIAGNOSIS — I252 Old myocardial infarction: Secondary | ICD-10-CM | POA: Diagnosis not present

## 2014-03-31 DIAGNOSIS — L03119 Cellulitis of unspecified part of limb: Principal | ICD-10-CM

## 2014-03-31 DIAGNOSIS — M79605 Pain in left leg: Secondary | ICD-10-CM | POA: Diagnosis present

## 2014-03-31 DIAGNOSIS — H9190 Unspecified hearing loss, unspecified ear: Secondary | ICD-10-CM | POA: Diagnosis not present

## 2014-03-31 LAB — CBC WITH DIFFERENTIAL/PLATELET
BASOS PCT: 0 % (ref 0–1)
Basophils Absolute: 0 10*3/uL (ref 0.0–0.1)
Eosinophils Absolute: 0.2 10*3/uL (ref 0.0–0.7)
Eosinophils Relative: 3 % (ref 0–5)
HCT: 37 % (ref 36.0–46.0)
HEMOGLOBIN: 12.8 g/dL (ref 12.0–15.0)
Lymphocytes Relative: 18 % (ref 12–46)
Lymphs Abs: 1.4 10*3/uL (ref 0.7–4.0)
MCH: 33 pg (ref 26.0–34.0)
MCHC: 34.6 g/dL (ref 30.0–36.0)
MCV: 95.4 fL (ref 78.0–100.0)
MONOS PCT: 9 % (ref 3–12)
Monocytes Absolute: 0.7 10*3/uL (ref 0.1–1.0)
NEUTROS PCT: 70 % (ref 43–77)
Neutro Abs: 5.5 10*3/uL (ref 1.7–7.7)
Platelets: 265 10*3/uL (ref 150–400)
RBC: 3.88 MIL/uL (ref 3.87–5.11)
RDW: 12.4 % (ref 11.5–15.5)
WBC: 7.9 10*3/uL (ref 4.0–10.5)

## 2014-03-31 LAB — BASIC METABOLIC PANEL
ANION GAP: 16 — AB (ref 5–15)
BUN: 16 mg/dL (ref 6–23)
CHLORIDE: 99 meq/L (ref 96–112)
CO2: 23 mEq/L (ref 19–32)
Calcium: 9.5 mg/dL (ref 8.4–10.5)
Creatinine, Ser: 1.07 mg/dL (ref 0.50–1.10)
GFR calc non Af Amer: 54 mL/min — ABNORMAL LOW (ref >90)
GFR, EST AFRICAN AMERICAN: 63 mL/min — AB (ref >90)
Glucose, Bld: 91 mg/dL (ref 70–99)
Potassium: 3.9 mEq/L (ref 3.7–5.3)
SODIUM: 138 meq/L (ref 137–147)

## 2014-03-31 MED ORDER — CEPHALEXIN 250 MG PO CAPS
1000.0000 mg | ORAL_CAPSULE | Freq: Once | ORAL | Status: AC
  Administered 2014-04-01: 1000 mg via ORAL
  Filled 2014-03-31: qty 4

## 2014-03-31 MED ORDER — OXYCODONE HCL 5 MG PO CAPS: 5.0000 mg | ORAL_CAPSULE | Freq: Four times a day (QID) | ORAL | Status: DC | PRN

## 2014-03-31 MED ORDER — RIVAROXABAN 15 MG PO TABS
15.0000 mg | ORAL_TABLET | Freq: Once | ORAL | Status: AC
  Administered 2014-04-01: 15 mg via ORAL
  Filled 2014-03-31: qty 1

## 2014-03-31 MED ORDER — OXYCODONE HCL 5 MG PO TABS
5.0000 mg | ORAL_TABLET | Freq: Once | ORAL | Status: AC
  Administered 2014-03-31: 5 mg via ORAL
  Filled 2014-03-31: qty 1

## 2014-03-31 MED ORDER — CEPHALEXIN 500 MG PO CAPS: 1000.0000 mg | ORAL_CAPSULE | Freq: Two times a day (BID) | ORAL | Status: DC

## 2014-03-31 NOTE — ED Notes (Signed)
MD at bedside. 

## 2014-03-31 NOTE — ED Notes (Signed)
Patient presents with bilateral leg swelling  Legs painful to touch

## 2014-03-31 NOTE — ED Provider Notes (Signed)
CSN: [id number]     Arrival date & time 03/31/14  2000 History   First MD Initiated Contact with Patient 03/31/14 2038     Chief Complaint  Patient presents with  . Leg Swelling     (Consider location/radiation/quality/duration/timing/severity/associated sxs/prior Treatment) HPI Traci Mclaughlin 63 y.o. 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 This started in the past 2 days. Started gradually and has been worsening. Associated with pain that is throbbing in character, nonradiaiting, no known exacerbating or releiving facroers. Associated with some slight overlying redness and slight warmth. No known injury. No fever at home. No N/V/D. No SOB, chest pain.   Past Medical History  Diagnosis Date  . Cancer     cervical; was on chemo; has been in remission for 51yrs  . STEMI (ST elevation myocardial infarction) 06/2011  . Acute kidney injury 07/06/2011  . Hypertension   . HOH (hard of hearing)   . CVA (cerebral infarction)    Past Surgical History  Procedure Laterality Date  . Cardiac catheterization  07/03/2011    Dr. Irish Lack  . Tee without cardioversion N/A 03/08/2014    Procedure: TRANSESOPHAGEAL ECHOCARDIOGRAM (TEE);  Surgeon: Fay Records, MD;  Location: [facility] ENDOSCOPY;  Service: Cardiovascular;  Laterality: N/A;  . 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 Intracerebral hemorrhage Daughter     during childbirth   History  Substance Use Topics  . Smoking status: Former Smoker -- 1.00 packs/day for 1 years    Types: Cigarettes  . Smokeless tobacco: Former Systems developer    Quit date: 06/16/2011  . Alcohol Use: Yes     Comment: occassional   OB History   Grav Para Term Preterm Abortions TAB SAB Ect Mult Living                  Review of Systems  All other systems reviewed and are negative.     Allergies  Review of patient's allergies indicates no known allergies.  Home Medications   Prior to Admission medications   Medication Sig Start Date End Date Taking? Authorizing Provider  acetaminophen (TYLENOL) 500 MG tablet Take 500 mg by mouth every 6 (six) hours as needed for mild pain.   Yes Historical Provider, MD  amLODipine (NORVASC) 5 MG tablet Take 5 mg by mouth daily. 03/23/14  Yes Ivan Anchors Love, PA-C  aspirin 81 MG chewable tablet Chew 81 mg by mouth daily. 03/23/14  Yes Ivan Anchors Love, PA-C  atorvastatin (LIPITOR) 40 MG tablet Take 40 mg by mouth daily at 6 PM. 03/23/14  Yes Ivan Anchors Love, PA-C  clopidogrel (PLAVIX) 75 MG tablet Take 1 tablet (75 mg total) by mouth daily. 03/23/14  Yes Ivan Anchors Love, PA-C  docusate sodium 100 MG CAPS Take 100 mg by mouth 2 (two) times daily. 03/23/14  Yes Ivan Anchors Love, PA-C  metoprolol tartrate (LOPRESSOR) 25 MG tablet Take 0.5 tablets (12.5 mg total) by mouth 2 (two) times daily. 03/23/14  Yes Ivan Anchors Love, PA-C  oxyCODONE 10 MG TABS Take 1-1.5 tablets (10-15 mg total) by mouth every 6 (six) hours as needed for severe pain. 03/23/14  Yes Ivan Anchors Love, PA-C  cephALEXin (KEFLEX) 500 MG capsule Take 2  capsules (1,000 mg total) by mouth 2 (two) times daily. 03/31/14 04/14/14  Kelby Aline, MD  oxycodone (OXY-IR) 5 MG capsule Take 1 capsule (5 mg total) by mouth every 6 (six) hours as needed. 03/31/14   Kelby Aline, MD   BP 162/78  Pulse 107  Temp(Src) 98.3 F (36.8 C) (Oral)  Resp 18  SpO2 99% Physical Exam  Constitutional: She appears well-developed and well-nourished. No distress.  HENT:  Head: Normocephalic and atraumatic.  Eyes: Conjunctivae are normal. Pupils are equal, round, and reactive to light.  Neck: Normal range of motion. No JVD present.  Cardiovascular: Normal rate and regular rhythm.   No murmur heard. Pulmonary/Chest: Effort normal and breath sounds  normal. No respiratory distress. She has no wheezes.  Abdominal: Soft. Bowel sounds are normal. She exhibits no distension. There is no tenderness.  Musculoskeletal:  Small 6 cm indurated region just superior and medial to the left popliteal fossa. Some slight overlying erythema. TTP of the same region. DP and PT pulses 2+ bilaterally.  Neurological: She is alert. GCS eye subscore is 4. GCS verbal subscore is 5. GCS motor subscore is 6.  Skin: Skin is warm. She is not diaphoretic.    ED Course  Procedures (including critical care time) Labs Review Labs Reviewed  BASIC METABOLIC PANEL - Abnormal; Notable for the following:    GFR calc non Af Amer 54 (*)    GFR calc Af Amer 63 (*)    Anion gap 16 (*)    All other components within normal limits  CBC WITH DIFFERENTIAL  SEDIMENTATION RATE  C-REACTIVE PROTEIN    Imaging Review No results found.   EKG Interpretation None      MDM   Final diagnoses:  Left leg pain  Cellulitis of left lower extremity    Pt presents with acute onset swelling on the left posterior leg. AFVSS. HDS. Tender area that is indurated, but sphere like. Some slight overlying erythema. POCUS showed no sig fluid collection. No leukocytosis. DDX include cellulitis, bakers cyst, DVT, ruptured hamstring muscle. Cannot perform duplex overnight. Started Keflex and what appears to be possibly uncomplicated cellulitis. Will return in the am for a LE [place] (order in place). Gave a dose of Xarelto in the ED. Gave rx for oxycodone for pain related to this. Safe to dc home with her grandson. Strong return precautions given for worsening symptoms or any other alarming or concerning symptoms or issues. The patient was in agreement with the treatment plan and I answered all of their questions. The patient was stable for dc. At dc, the patient ambulated without difficulty, was moving all four extremities, symptoms improved, NAD. and AOx4 Care discussed with my attending, Dr. Stevie Kern.  If performed and available, imaging studies and labs reviewed.     Kelby Aline, MD 04/01/14 0100

## 2014-03-31 NOTE — ED Provider Notes (Signed)
I saw and evaluated the patient, reviewed the resident's note and I agree with the findings and plan.   EKG Interpretation None     Tender mass left medial distal thigh with mild warmth erythema; DP pulse intact left foot  Babette Relic, MD 04/01/14 1227

## 2014-04-01 ENCOUNTER — Ambulatory Visit (HOSPITAL_COMMUNITY)
Admission: RE | Admit: 2014-04-01 | Discharge: 2014-04-01 | Disposition: A | Discharge status: Home / Self Care | Payer: Medicare Other | Source: Ambulatory Visit | Attending: Emergency Medicine | Admitting: Emergency Medicine

## 2014-04-01 ENCOUNTER — Emergency Department (HOSPITAL_COMMUNITY)
Admission: EM | Admit: 2014-04-01 | Discharge: 2014-04-01 | Disposition: A | Discharge status: Home / Self Care | Payer: Medicare Other | Attending: Emergency Medicine | Admitting: Emergency Medicine

## 2014-04-01 ENCOUNTER — Encounter (HOSPITAL_COMMUNITY): Payer: Self-pay | Admitting: Emergency Medicine

## 2014-04-01 DIAGNOSIS — Z7901 Long term (current) use of anticoagulants: Secondary | ICD-10-CM | POA: Diagnosis not present

## 2014-04-01 DIAGNOSIS — M7989 Other specified soft tissue disorders: Secondary | ICD-10-CM

## 2014-04-01 DIAGNOSIS — Z792 Long term (current) use of antibiotics: Secondary | ICD-10-CM | POA: Insufficient documentation

## 2014-04-01 DIAGNOSIS — Z87891 Personal history of nicotine dependence: Secondary | ICD-10-CM | POA: Insufficient documentation

## 2014-04-01 DIAGNOSIS — I824Y9 Acute embolism and thrombosis of unspecified deep veins of unspecified proximal lower extremity: Secondary | ICD-10-CM | POA: Insufficient documentation

## 2014-04-01 DIAGNOSIS — I739 Peripheral vascular disease, unspecified: Secondary | ICD-10-CM

## 2014-04-01 DIAGNOSIS — I82402 Acute embolism and thrombosis of unspecified deep veins of left lower extremity: Secondary | ICD-10-CM

## 2014-04-01 DIAGNOSIS — M79605 Pain in left leg: Secondary | ICD-10-CM

## 2014-04-01 DIAGNOSIS — I82409 Acute embolism and thrombosis of unspecified deep veins of unspecified lower extremity: Secondary | ICD-10-CM | POA: Diagnosis present

## 2014-04-01 DIAGNOSIS — R21 Rash and other nonspecific skin eruption: Secondary | ICD-10-CM | POA: Diagnosis not present

## 2014-04-01 DIAGNOSIS — H9190 Unspecified hearing loss, unspecified ear: Secondary | ICD-10-CM | POA: Diagnosis not present

## 2014-04-01 DIAGNOSIS — Z7982 Long term (current) use of aspirin: Secondary | ICD-10-CM | POA: Diagnosis not present

## 2014-04-01 DIAGNOSIS — I10 Essential (primary) hypertension: Secondary | ICD-10-CM | POA: Insufficient documentation

## 2014-04-01 DIAGNOSIS — I252 Old myocardial infarction: Secondary | ICD-10-CM | POA: Insufficient documentation

## 2014-04-01 DIAGNOSIS — M79609 Pain in unspecified limb: Secondary | ICD-10-CM

## 2014-04-01 DIAGNOSIS — I7789 Other specified disorders of arteries and arterioles: Secondary | ICD-10-CM | POA: Diagnosis not present

## 2014-04-01 DIAGNOSIS — Z9889 Other specified postprocedural states: Secondary | ICD-10-CM | POA: Insufficient documentation

## 2014-04-01 DIAGNOSIS — Z8673 Personal history of transient ischemic attack (TIA), and cerebral infarction without residual deficits: Secondary | ICD-10-CM | POA: Diagnosis not present

## 2014-04-01 DIAGNOSIS — Z87448 Personal history of other diseases of urinary system: Secondary | ICD-10-CM | POA: Diagnosis not present

## 2014-04-01 DIAGNOSIS — Z79899 Other long term (current) drug therapy: Secondary | ICD-10-CM | POA: Diagnosis not present

## 2014-04-01 LAB — BASIC METABOLIC PANEL
ANION GAP: 16 — AB (ref 5–15)
BUN: 17 mg/dL (ref 6–23)
CHLORIDE: 98 meq/L (ref 96–112)
CO2: 23 mEq/L (ref 19–32)
Calcium: 9.5 mg/dL (ref 8.4–10.5)
Creatinine, Ser: 1.13 mg/dL — ABNORMAL HIGH (ref 0.50–1.10)
GFR calc non Af Amer: 51 mL/min — ABNORMAL LOW (ref >90)
GFR, EST AFRICAN AMERICAN: 59 mL/min — AB (ref >90)
Glucose, Bld: 106 mg/dL — ABNORMAL HIGH (ref 70–99)
POTASSIUM: 4.1 meq/L (ref 3.7–5.3)
SODIUM: 137 meq/L (ref 137–147)

## 2014-04-01 LAB — CBC WITH DIFFERENTIAL/PLATELET
BASOS ABS: 0 10*3/uL (ref 0.0–0.1)
BASOS PCT: 0 % (ref 0–1)
Eosinophils Absolute: 0.2 10*3/uL (ref 0.0–0.7)
Eosinophils Relative: 2 % (ref 0–5)
HEMATOCRIT: 37.9 % (ref 36.0–46.0)
Hemoglobin: 12.7 g/dL (ref 12.0–15.0)
LYMPHS PCT: 13 % (ref 12–46)
Lymphs Abs: 1.2 10*3/uL (ref 0.7–4.0)
MCH: 32.8 pg (ref 26.0–34.0)
MCHC: 33.5 g/dL (ref 30.0–36.0)
MCV: 97.9 fL (ref 78.0–100.0)
Monocytes Absolute: 0.7 10*3/uL (ref 0.1–1.0)
Monocytes Relative: 7 % (ref 3–12)
NEUTROS PCT: 78 % — AB (ref 43–77)
Neutro Abs: 6.9 10*3/uL (ref 1.7–7.7)
PLATELETS: 275 10*3/uL (ref 150–400)
RBC: 3.87 MIL/uL (ref 3.87–5.11)
RDW: 12.5 % (ref 11.5–15.5)
WBC: 8.8 10*3/uL (ref 4.0–10.5)

## 2014-04-01 LAB — PROTIME-INR
INR: 1.59 — AB (ref 0.00–1.49)
Prothrombin Time: 19 seconds — ABNORMAL HIGH (ref 11.6–15.2)

## 2014-04-01 LAB — C-REACTIVE PROTEIN: CRP: 6.3 mg/dL — AB (ref <0.60)

## 2014-04-01 LAB — APTT: APTT: 60 s — AB (ref 24–37)

## 2014-04-01 LAB — SEDIMENTATION RATE: SED RATE: 100 mm/h — AB (ref 0–22)

## 2014-04-01 MED ORDER — FENTANYL CITRATE 0.05 MG/ML IJ SOLN
50.0000 ug | Freq: Once | INTRAMUSCULAR | Status: AC
  Administered 2014-04-01: 50 ug via INTRAVENOUS
  Filled 2014-04-01: qty 2

## 2014-04-01 MED ORDER — ONDANSETRON HCL 4 MG PO TABS: 4.0000 mg | ORAL_TABLET | Freq: Three times a day (TID) | ORAL | Status: DC | PRN

## 2014-04-01 MED ORDER — OXYCODONE-ACETAMINOPHEN 5-325 MG PO TABS
1.0000 | ORAL_TABLET | Freq: Once | ORAL | Status: AC
  Administered 2014-04-01: 1 via ORAL
  Filled 2014-04-01: qty 1

## 2014-04-01 MED ORDER — OXYCODONE-ACETAMINOPHEN 10-325 MG PO TABS: 1.0000 | ORAL_TABLET | Freq: Four times a day (QID) | ORAL | Status: DC | PRN

## 2014-04-01 MED ORDER — XARELTO VTE STARTER PACK 15 & 20 MG PO TBPK: 15.0000 mg | ORAL_TABLET | ORAL | Status: DC

## 2014-04-01 NOTE — ED Notes (Signed)
Left leg pain for 2 months; seen here yesterday for the same. Came for outpatient DVT study as directed by ER MD from last night. DVT present per U/S; report to ED.

## 2014-04-01 NOTE — Discharge Instructions (Addendum)
We saw you in the ER AFTER you were diagnosed with blood clot in your leg, left. START XARELTO. Please see Dr. Scot Dock, Vascular Surgery this week. They should call you on Tuesday, but if they don't please call them and ensure you are seen this week. RETURN TO THE ER IF THERE IS INCREASED PAIN, REDNESS, CHANGE IN SKIN COLOR OR INCREASINGLY COLD LEG.  Deep Vein Thrombosis A deep vein thrombosis (DVT) is a blood clot that develops in the deep, larger veins of the leg, arm, or pelvis. These are more dangerous than clots that might form in veins near the surface of the body. A DVT can lead to serious and even life-threatening complications if the clot breaks off and travels in the bloodstream to the lungs.  A DVT can damage the valves in your leg veins so that instead of flowing upward, the blood pools in the lower leg. This is called post-thrombotic syndrome, and it can result in pain, swelling, discoloration, and sores on the leg. CAUSES Usually, several things contribute to the formation of blood clots. Contributing factors include:  The flow of blood slows down.  The inside of the vein is damaged in some way.  You have a condition that makes blood clot more easily. RISK FACTORS Some people are more likely than others to develop blood clots. Risk factors include:   Smoking.  Being overweight (obese).  Sitting or lying still for a long time. This includes long-distance travel, paralysis, or recovery from an illness or surgery. Other factors that increase risk are:   Older age, especially over 12 years of age.  Having a family history of blood clots or if you have already had a blot clot.  Having major or lengthy surgery. This is especially true for surgery on the hip, knee, or belly (abdomen). Hip surgery is particularly high risk.  Having a long, thin tube (catheter) placed inside a vein during a medical procedure.  Breaking a hip or leg.  Having cancer or cancer  treatment.  Pregnancy and childbirth.  Hormone changes make the blood clot more easily during pregnancy.  The fetus puts pressure on the veins of the pelvis.  There is a risk of injury to veins during delivery or a caesarean delivery. The risk is highest just after childbirth.  Medicines containing the female hormone estrogen. This includes birth control pills and hormone replacement therapy.  Other circulation or heart problems.  SIGNS AND SYMPTOMS When a clot forms, it can either partially or totally block the blood flow in that vein. Symptoms of a DVT can include:  Swelling of the leg or arm, especially if one side is much worse.  Warmth and redness of the leg or arm, especially if one side is much worse.  Pain in an arm or leg. If the clot is in the leg, symptoms may be more noticeable or worse when standing or walking. The symptoms of a DVT that has traveled to the lungs (pulmonary embolism, PE) usually start suddenly and include:  Shortness of breath.  Coughing.  Coughing up blood or blood-tinged mucus.  Chest pain. The chest pain is often worse with deep breaths.  Rapid heartbeat. Anyone with these symptoms should get emergency medical treatment right away. Do not wait to see if the symptoms will go away. Call your local emergency services (911 in the [place] if you have these symptoms. Do not drive yourself to the hospital. DIAGNOSIS If a DVT is suspected, your health care provider will take  a full medical history and perform a physical exam. Tests that also may be required include:  Blood tests, including studies of the clotting properties of the blood.  Ultrasound to see if you have clots in your legs or lungs.  X-rays to show the flow of blood when dye is injected into the veins (venogram).  Studies of your lungs if you have any chest symptoms. PREVENTION  Exercise the legs regularly. Take a brisk 30-minute walk every day.  Maintain a weight that is  appropriate for your height.  Avoid sitting or lying in bed for long periods of time without moving your legs.  Women, particularly those over the age of 74 years, should consider the risks and benefits of taking estrogen medicines, including birth control pills.  Do not smoke, especially if you take estrogen medicines.  Long-distance travel can increase your risk of DVT. You should exercise your legs by walking or pumping the muscles every hour.  Many of the risk factors above relate to situations that exist with hospitalization, either for illness, injury, or elective surgery. Prevention may include medical and nonmedical measures.  Your health care provider will assess you for the need for venous thromboembolism prevention when you are admitted to the hospital. If you are having surgery, your surgeon will assess you the day of or day after surgery. TREATMENT Once identified, a DVT can be treated. It can also be prevented in some circumstances. Once you have had a DVT, you may be at increased risk for a DVT in the future. The most common treatment for DVT is blood-thinning (anticoagulant) medicine, which reduces the blood's tendency to clot. Anticoagulants can stop new blood clots from forming and stop old clots from growing. They cannot dissolve existing clots. Your body does this by itself over time. Anticoagulants can be given by mouth, through an IV tube, or by injection. Your health care provider will determine the best program for you. Other medicines or treatments that may be used are:  Heparin or related medicines (low molecular weight heparin) are often the first treatment for a blood clot. They act quickly. However, they cannot be taken orally and must be given either in shot form or by IV tube.  Heparin can cause a fall in a component of blood that stops bleeding and forms blood clots (platelets). You will be monitored with blood tests to be sure this does not occur.  Warfarin is an  anticoagulant that can be swallowed. It takes a few days to start working, so usually heparin or related medicines are used in combination. Once warfarin is working, heparin is usually stopped.  Factor Xa inhibitor medicines, such as rivaroxaban and apixaban, also reduce blood clotting. These medicines are taken orally and can often be used without heparin or related medicines.  Less commonly, clot dissolving drugs (thrombolytics) are used to dissolve a DVT. They carry a high risk of bleeding, so they are used mainly in severe cases where your life or a part of your body is threatened.  Very rarely, a blood clot in the leg needs to be removed surgically.  If you are unable to take anticoagulants, your health care provider may arrange for you to have a filter placed in a main vein in your abdomen. This filter prevents clots from traveling to your lungs. HOME CARE INSTRUCTIONS  Take all medicines as directed by your health care provider.  Learn as much as you can about DVT.  Wear a medical alert bracelet or carry  a medical alert card.  Ask your health care provider how soon you can go back to normal activities. It is important to stay active to prevent blood clots. If you are on anticoagulant medicine, avoid contact sports.  It is very important to exercise. This is especially important while traveling, sitting, or standing for long periods of time. Exercise your legs by walking or by tightening and relaxing your leg muscles regularly. Take frequent walks.  You may need to wear compression stockings. These are tight elastic stockings that apply pressure to the lower legs. This pressure can help keep the blood in the legs from clotting. Taking Warfarin Warfarin is a daily medicine that is taken by mouth. Your health care provider will advise you on the length of treatment (usually 3-6 months, sometimes lifelong). If you take warfarin:  Understand how to take warfarin and foods that can affect  how warfarin works in Veterinary surgeon.  Too much and too little warfarin are both dangerous. Too much warfarin increases the risk of bleeding. Too little warfarin continues to allow the risk for blood clots. Warfarin and Regular Blood Testing While taking warfarin, you will need to have regular blood tests to measure your blood clotting time. These blood tests usually include both the prothrombin time (PT) and international normalized ratio (INR) tests. The PT and INR results allow your health care provider to adjust your dose of warfarin. It is very important that you have your PT and INR tested as often as directed by your health care provider.  Warfarin and Your Diet Avoid major changes in your diet, or notify your health care provider before changing your diet. Arrange a visit with a registered dietitian to answer your questions. Many foods, especially foods high in vitamin K, can interfere with warfarin and affect the PT and INR results. You should eat a consistent amount of foods high in vitamin K. Foods high in vitamin K include:   Spinach, kale, broccoli, cabbage, collard and turnip greens, Brussels sprouts, peas, cauliflower, seaweed, and parsley.  Beef and pork liver.  Green tea.  Soybean oil. Warfarin with Other Medicines Many medicines can interfere with warfarin and affect the PT and INR results. You must:  Tell your health care provider about any and all medicines, vitamins, and supplements you take, including aspirin and other over-the-counter anti-inflammatory medicines. Be especially cautious with aspirin and anti-inflammatory medicines. Ask your health care provider before taking these.  Do not take or discontinue any prescribed or over-the-counter medicine except on the advice of your health care provider or pharmacist. Warfarin Side Effects Warfarin can have side effects, such as easy bruising and difficulty stopping bleeding. Ask your health care provider or pharmacist about  other side effects of warfarin. You will need to:  Hold pressure over cuts for longer than usual.  Notify your dentist and other health care providers that you are taking warfarin before you undergo any procedures where bleeding may occur. Warfarin with Alcohol and Tobacco   Drinking alcohol frequently can increase the effect of warfarin, leading to excess bleeding. It is best to avoid alcoholic drinks or to consume only very small amounts while taking warfarin. Notify your health care provider if you change your alcohol intake.   Do not use any tobacco products including cigarettes, chewing tobacco, or electronic cigarettes. If you smoke, quit. Ask your health care provider for help with quitting smoking. Alternative Medicines to Warfarin: Factor Xa Inhibitor Medicines  These blood-thinning medicines are taken by mouth, usually  for several weeks or longer. It is important to take the medicine every single day at the same time each day.  There are no regular blood tests required when using these medicines.  There are fewer food and drug interactions than with warfarin.  The side effects of this class of medicine are similar to those of warfarin, including excessive bruising or bleeding. Ask your health care provider or pharmacist about other potential side effects. SEEK MEDICAL CARE IF:  You notice a rapid heartbeat.  You feel weaker or more tired than usual.  You feel faint.  You notice increased bruising.  You feel your symptoms are not getting better in the time expected.  You believe you are having side effects of medicine. SEEK IMMEDIATE MEDICAL CARE IF:  You have chest pain.  You have trouble breathing.  You have new or increased swelling or pain in one leg.  You cough up blood.  You notice blood in vomit, in a bowel movement, or in urine. MAKE SURE YOU:  Understand these instructions.  Will watch your condition.  Will get help right away if you are not doing  well or get worse. Document Released: 07/13/2005 Document Revised: 11/27/2013 Document Reviewed: 03/20/2013 [facility] Patient Information 2015 [place], [place]. This information is not intended to replace advice given to you by your health care provider. Make sure you discuss any questions you have with your health care provider.    Information on my medicine - XARELTO (rivaroxaban)  This medication education was reviewed with me or my healthcare representative as part of my discharge preparation.    WHY WAS XARELTO PRESCRIBED FOR YOU? Xarelto was prescribed to treat blood clots that may have been found in the veins of your legs (deep vein thrombosis) or in your lungs (pulmonary embolism) and to reduce the risk of them occurring again.  What do you need to know about Xarelto? The starting dose is one 15 mg tablet taken TWICE daily with food for the FIRST 21 DAYS then on 04/22/14  the dose is changed to one 20 mg tablet taken ONCE A DAY with your evening meal.  DO NOT stop taking Xarelto without talking to the health care provider who prescribed the medication.  Refill your prescription for 20 mg tablets before you run out.  After discharge, you should have regular check-up appointments with your healthcare provider that is prescribing your Xarelto.  In the future your dose may need to be changed if your kidney function changes by a significant amount.  What do you do if you miss a dose? If you are taking Xarelto TWICE DAILY and you miss a dose, take it as soon as you remember. You may take two 15 mg tablets (total 30 mg) at the same time then resume your regularly scheduled 15 mg twice daily the next day.  If you are taking Xarelto ONCE DAILY and you miss a dose, take it as soon as you remember on the same day then continue your regularly scheduled once daily regimen the next day. Do not take two doses of Xarelto at the same time.   Important Safety Information Xarelto is a blood  thinner medicine that can cause bleeding. You should call your healthcare provider right away if you experience any of the following:   Bleeding from an injury or your nose that does not stop.   Unusual colored urine (red or dark brown) or unusual colored stools (red or black).   Unusual bruising for unknown reasons.  A serious fall or if you hit your head (even if there is no bleeding).  Some medicines may interact with Xarelto and might increase your risk of bleeding while on Xarelto. To help avoid this, consult your healthcare provider or pharmacist prior to using any new prescription or non-prescription medications, including herbals, vitamins, non-steroidal anti-inflammatory drugs (NSAIDs) and supplements.  This website has more information on Xarelto: [email or web address].[email or web address]

## 2014-04-01 NOTE — Progress Notes (Signed)
VASCULAR LAB PRELIMINARY  PRELIMINARY  PRELIMINARY  PRELIMINARY  Bilateral lower extremity venous Dopplers completed.    Preliminary report:  There is non occlusive, acute DVT noted in the left femoral and popliteal veins.  There is acute superficial thrombosis noted in the left greater saphenous vein, distal thigh and popliteal areas.   Rahima Fleishman, RVT 04/01/2014, 9:16 AM

## 2014-04-01 NOTE — ED Provider Notes (Addendum)
CSN: [id number]     Arrival date & time 04/01/14  9562 History   First MD Initiated Contact with Patient 04/01/14 [phone]     Chief Complaint  Patient presents with  . DVT     (Consider location/radiation/quality/duration/timing/severity/associated sxs/prior Treatment) HPI Comments: Pt with complex medical hx, including recent admission for stroke and finding of PAD, bilaterally, Left worse than right comes in from the Radiology department, after she was found to have a DVT. PLEASE REFER TO THE NOTE FROM 03/31/14 ABOUT THAT VISIT. Pt denies change in pain patter to her LLE overnight, or feeling that it is more cold (or cold at all) over night.  Pt to see Dr. Scot Dock, Vascular Surgery on 9/15, and he is to decide on amputation vs. Bypass at that time. Pt running out of her perc 10.  The history is provided by the patient.    Past Medical History  Diagnosis Date  . Cancer     cervical; was on chemo; has been in remission for 50yrs  . STEMI (ST elevation myocardial infarction) 06/2011  . Acute kidney injury 07/06/2011  . Hypertension   . HOH (hard of hearing)   . CVA (cerebral infarction)    Past Surgical History  Procedure Laterality Date  . Cardiac catheterization  07/03/2011    Dr. Irish Lack  . Tee without cardioversion N/A 03/08/2014    Procedure: TRANSESOPHAGEAL ECHOCARDIOGRAM (TEE);  Surgeon: Fay Records, MD;  Location: [facility] ENDOSCOPY;  Service: Cardiovascular;  Laterality: N/A;  . 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 Intracerebral hemorrhage Daughter     during childbirth   History  Substance Use Topics  . Smoking status: Former Smoker -- 1.00 packs/day for 1 years    Types: Cigarettes  . Smokeless tobacco: Former Systems developer    Quit date: 06/16/2011  . Alcohol Use: Yes     Comment: occassional   OB History   Grav Para Term Preterm Abortions TAB SAB Ect Mult Living                  Review of Systems  Constitutional: Negative for fever and chills.  Musculoskeletal: Positive for myalgias.  Skin: Positive for rash.  Neurological: Negative for numbness.      Allergies  Review of patient's allergies indicates no known allergies.  Home Medications   Prior to Admission medications   Medication Sig Start Date End Date Taking? Authorizing Provider  acetaminophen (TYLENOL) 500 MG tablet Take 500 mg by mouth every 6 (six) hours as needed for mild pain.   Yes Historical Provider, MD  amLODipine (NORVASC) 5 MG tablet Take 5 mg by mouth daily. 03/23/14  Yes Ivan Anchors Love, PA-C  aspirin 81 MG chewable tablet Chew 81 mg by mouth daily. 03/23/14  Yes Ivan Anchors Love, PA-C  atorvastatin (LIPITOR) 40 MG tablet Take 40 mg by mouth daily at 6 PM. 03/23/14  Yes Ivan Anchors Love, PA-C  clopidogrel (PLAVIX) 75 MG tablet Take 1 tablet (75 mg total) by mouth daily. 03/23/14  Yes Ivan Anchors Love, PA-C  docusate sodium 100 MG CAPS Take 100 mg by mouth 2 (two) times daily. 03/23/14  Yes Ivan Anchors Love, PA-C  metoprolol tartrate (LOPRESSOR) 25 MG tablet Take 0.5 tablets (12.5 mg total) by mouth 2 (two) times daily. 03/23/14  Yes Ivan Anchors Love, PA-C  oxyCODONE 10 MG TABS Take 1-1.5  tablets (10-15 mg total) by mouth every 6 (six) hours as needed for severe pain. 03/23/14  Yes Ivan Anchors Love, PA-C  cephALEXin (KEFLEX) 500 MG capsule Take 2 capsules (1,000 mg total) by mouth 2 (two) times daily. 03/31/14 04/14/14  Kelby Aline, MD  ondansetron (ZOFRAN) 4 MG tablet Take 1 tablet (4 mg total) by mouth every 8 (eight) hours as needed for nausea or vomiting. 04/01/14   Varney Biles, MD  oxyCODONE-acetaminophen (PERCOCET) 10-325 MG per tablet Take 1 tablet by mouth every 6 (six) hours as needed for pain. 04/01/14   Melane Windholz, MD  XARELTO STARTER PACK 15 & 20 MG TBPK Take 15-20 mg by mouth as directed. Take as directed on package: Start with one 15mg  tablet by mouth twice a day with food. On Day 22, switch to  one 20mg  tablet once a day with food. 04/01/14   Sharon Stapel Kathrynn Humble, MD   BP 145/61  Pulse 75  Temp(Src) 98.5 F (36.9 C) (Oral)  Resp 18  Ht 5\' 4"  (1.626 m)  Wt 199 lb (90.266 kg)  BMI 34.14 kg/m2  SpO2 98% Physical Exam  Nursing note and vitals reviewed. Constitutional: She is oriented to person, place, and time. She appears well-developed.  HENT:  Head: Atraumatic.  Cardiovascular: Normal rate.   Pulmonary/Chest: Effort normal. No respiratory distress.  Neurological: She is alert and oriented to person, place, and time.  Skin:  LLE - cold to touch compared to the contralateral side distal to the tibial tuberosity. The 5th toe has lateral skin hyperpigmentation. There is a faintly palpable DP, confirmed with [place].    ED Course  Procedures (including critical care time) Labs Review Labs Reviewed  CBC WITH DIFFERENTIAL - Abnormal; Notable for the following:    Neutrophils Relative % 78 (*)    All other components within normal limits  BASIC METABOLIC PANEL - Abnormal; Notable for the following:    Glucose, Bld 106 (*)    Creatinine, Ser 1.13 (*)    GFR calc non Af Amer 51 (*)    GFR calc Af Amer 59 (*)    Anion gap 16 (*)    All other components within normal limits  APTT - Abnormal; Notable for the following:    aPTT 60 (*)    All other components within normal limits  PROTIME-INR - Abnormal; Notable for the following:    Prothrombin Time 19.0 (*)    INR 1.59 (*)    All other components within normal limits    Imaging Review No results found.   EKG Interpretation None      MDM   Final diagnoses:  DVT (deep venous thrombosis), left  Peripheral arterial disease    Pt is a known vasculopath with strokes, and PAD. She is noted to have acute DVT. There is no ischemic limb at this time objectively, although, her pain and cold feet means that she certainly has some element of poor circulation. Pain tolerable.  Pt and grand daughter were advised on warning signs  for emergent returns. i spoke with Dr. Oneida Alar, and we feel comfortable at this time to have patient be started on xarelto and get her f/u moved on. With no worsening of her leg clinically, i dont see any utility for Dr. Oneida Alar to come to the ER and assess - as no emergent procedure needed.   Varney Biles, MD 04/01/14 1125  12:19 PM Asked to d/c plavix and continue baby ASA in conjunction with new Xarelto after speaking with  Dr. Kathrynn Speed.   Varney Biles, MD 04/01/14 1220

## 2014-04-03 ENCOUNTER — Telehealth: Payer: Self-pay | Admitting: Vascular Surgery

## 2014-04-03 DIAGNOSIS — A94 Unspecified arthropod-borne viral fever: Secondary | ICD-10-CM

## 2014-04-03 DIAGNOSIS — N183 Chronic kidney disease, stage 3 unspecified: Secondary | ICD-10-CM

## 2014-04-03 DIAGNOSIS — G8190 Hemiplegia, unspecified affecting unspecified side: Secondary | ICD-10-CM

## 2014-04-03 DIAGNOSIS — I10 Essential (primary) hypertension: Secondary | ICD-10-CM

## 2014-04-03 DIAGNOSIS — N179 Acute kidney failure, unspecified: Secondary | ICD-10-CM

## 2014-04-03 DIAGNOSIS — C539 Malignant neoplasm of cervix uteri, unspecified: Secondary | ICD-10-CM

## 2014-04-03 DIAGNOSIS — I6350 Cerebral infarction due to unspecified occlusion or stenosis of unspecified cerebral artery: Secondary | ICD-10-CM

## 2014-04-03 NOTE — Telephone Encounter (Signed)
Left message for pt to cb for appt info. This week, 09/09, dpm

## 2014-04-03 NOTE — Telephone Encounter (Signed)
Message copied by Gena Fray on Tue Apr 03, 2014 10:47 AM ------      Message from: Elam Dutch      Created: Sun Apr 01, 2014 11:17 AM       This patient has been seen by Scot Dock in past.  She was in the ER with a DVT.  She thinks her foot is getting worse.  Can you please move up her appt with Dr Scot Dock to this week.            Thanks            Ruta Hinds ------

## 2014-04-04 ENCOUNTER — Encounter: Payer: Self-pay | Admitting: Vascular Surgery

## 2014-04-04 ENCOUNTER — Ambulatory Visit (INDEPENDENT_AMBULATORY_CARE_PROVIDER_SITE_OTHER): Payer: Medicare Other | Admitting: Vascular Surgery

## 2014-04-04 ENCOUNTER — Ambulatory Visit: Payer: Medicare Other | Admitting: Physician Assistant

## 2014-04-04 VITALS — BP 147/79 | HR 96 | Resp 16 | Ht 63.0 in | Wt 190.0 lb

## 2014-04-04 DIAGNOSIS — I998 Other disorder of circulatory system: Secondary | ICD-10-CM

## 2014-04-04 DIAGNOSIS — I999 Unspecified disorder of circulatory system: Secondary | ICD-10-CM

## 2014-04-04 DIAGNOSIS — M79605 Pain in left leg: Secondary | ICD-10-CM

## 2014-04-04 DIAGNOSIS — M79609 Pain in unspecified limb: Secondary | ICD-10-CM

## 2014-04-04 NOTE — Assessment & Plan Note (Signed)
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 The foot is stable with no change in the fifth toe or lateral aspect of the foot. She is at very high risk for surgery given her recent stroke and non-ST MI. In addition she is at high risk for infection because of her obesity. The situation is further complicated by her recent left lower extremity DVT. As her symptoms appear to be stable I have had a long discussion with the family and we agreed it would be best to wait on revascularization until she has had some time to resolve her DVT. Her only surgical option is a right to left fem-fem bypass. She is concerned about the cost of the Xarelto and would like to be switched to Coumadin. She will try to discuss this with her primary care physician. If her symptoms progress or the foot worsens then we could proceed with right to left fem-fem bypass grafting. We would have to stop her Xarelto (unless she is switched to Coumadin we would have to stop fat), and bridge her with Lovenox before surgery. I've ordered a follow up visit in one months and I'll see her back at that time. She knows to call sooner if she has problems.

## 2014-04-04 NOTE — Progress Notes (Signed)
Patient name: Traci Mclaughlin MRN: [id number] DOB: 1951-01-14 Sex: female  REASON FOR VISIT: Follow up of chronic left lower extremity ischemia  HPI: Traci Mclaughlin is a 63 y.o. 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 She had been admitted with a left brain stroke with right-sided weakness and was markedly debilitated. She also had a non-ST MI. For all these reasons I felt she was high risk for surgery and were trying we'll for urgent revascularization given that this was chronic ischemia. She was discharged to rehabilitation returns for an outpatient visit. Since I saw her in the hospital, she has been diagnosed with a left lower extremity DVT and is now on Xarelto.  Her venous duplex scan on 04/01/2014 showed nonocclusive acute DVT in the left common femoral and popliteal veins. There was also some acute superficial thrombus in the left greater saphenous vein and distal thigh.  She continues to have rest pain in her left foot with no symptoms on the right side.  REVIEW OF SYSTEMS: Valu.Nieves ] denotes positive finding; [  ] denotes negative finding  CARDIOVASCULAR:  [ ]  chest pain   [ ]  dyspnea on exertion    CONSTITUTIONAL:  [ ]  fever   [ ]  chills  PHYSICAL EXAM: Filed Vitals:   04/04/14 0921  BP: 147/79  Pulse: 96  Resp: 16  Height: 5\' 3"  (1.6 m)  Weight: 190 lb (86.183 kg)   Body mass index is 33.67 kg/(m^2). GENERAL: The patient is a well-nourished female, in no acute distress. The vital signs are documented above. CARDIOVASCULAR: There is a regular rate and rhythm. PULMONARY: There is good air exchange bilaterally without wheezing or rales. Her left foot is unchanged with discoloration of the left fifth toe and lateral aspect of her left foot which is the same as when she was in the hospital. She has a palpable right femoral pulse with no left femoral pulse. She is obese.  I have reviewed her  arteriogram which shows an occluded left common iliac artery and external iliac artery. There is some mild disease of the distal external iliac artery on the right. She does appear to have a decent pulse in the right groin however I think she is a candidate for a right to left fem-fem bypass graft. The left common femoral artery is occluded and this extends into the deep femoral artery and superficial femoral artery making the distal anastomosis on the left side a challenge and she will require extensive endarterectomy.  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 The foot is stable with no change in the fifth toe or lateral aspect of the foot. She is at very high risk for surgery given her recent stroke and non-ST MI. In addition she is at high risk for infection because of her obesity. The situation is further complicated by her recent left lower extremity DVT. As her symptoms appear to be stable I have had a long discussion with the family and we agreed it would be best to wait on revascularization until she has had some time to resolve her DVT. Her only surgical option is a right to left fem-fem bypass. She is concerned about the cost of the Xarelto and would like to be switched to Coumadin. She will  try to discuss this with her primary care physician. If her symptoms progress or the foot worsens then we could proceed with right to left fem-fem bypass grafting. We would have to stop her Xarelto (unless she is switched to Coumadin we would have to stop fat), and bridge her with Lovenox before surgery. I've ordered a follow up visit in one months and I'll see her back at that time. She knows to call sooner if she has problems.   [facility] Beeper: [phone]

## 2014-04-09 ENCOUNTER — Ambulatory Visit: Payer: Medicare Other | Admitting: Physician Assistant

## 2014-04-10 ENCOUNTER — Ambulatory Visit (INDEPENDENT_AMBULATORY_CARE_PROVIDER_SITE_OTHER): Payer: Medicare Other | Admitting: *Deleted

## 2014-04-10 ENCOUNTER — Encounter: Payer: Self-pay | Admitting: Physician Assistant

## 2014-04-10 ENCOUNTER — Ambulatory Visit (INDEPENDENT_AMBULATORY_CARE_PROVIDER_SITE_OTHER): Payer: Medicare Other | Admitting: Physician Assistant

## 2014-04-10 VITALS — BP 130/90 | HR 107 | Ht 63.0 in | Wt 186.0 lb

## 2014-04-10 DIAGNOSIS — I82409 Acute embolism and thrombosis of unspecified deep veins of unspecified lower extremity: Secondary | ICD-10-CM

## 2014-04-10 DIAGNOSIS — R7989 Other specified abnormal findings of blood chemistry: Secondary | ICD-10-CM

## 2014-04-10 DIAGNOSIS — E785 Hyperlipidemia, unspecified: Secondary | ICD-10-CM

## 2014-04-10 DIAGNOSIS — Z8673 Personal history of transient ischemic attack (TIA), and cerebral infarction without residual deficits: Secondary | ICD-10-CM

## 2014-04-10 DIAGNOSIS — R778 Other specified abnormalities of plasma proteins: Secondary | ICD-10-CM

## 2014-04-10 DIAGNOSIS — I82402 Acute embolism and thrombosis of unspecified deep veins of left lower extremity: Secondary | ICD-10-CM

## 2014-04-10 DIAGNOSIS — I739 Peripheral vascular disease, unspecified: Secondary | ICD-10-CM

## 2014-04-10 DIAGNOSIS — I10 Essential (primary) hypertension: Secondary | ICD-10-CM

## 2014-04-10 HISTORY — DX: Peripheral vascular disease, unspecified: I73.9

## 2014-04-10 MED ORDER — METOPROLOL TARTRATE 25 MG PO TABS: 25.0000 mg | ORAL_TABLET | Freq: Two times a day (BID) | ORAL | Status: DC

## 2014-04-10 NOTE — Progress Notes (Signed)
Cardiology Office Note    Date:  04/10/2014   ID:  Traci Mclaughlin, DOB 07-31-1950, MRN [id number]  PCP:  Leeanne Rio, PA-C  Cardiologist:  Dr. Casandra Doffing   Electrophysiologist:  Dr. Thompson Grayer    History of Present Illness: Traci Mclaughlin is a 63 y.o. female with a hx of HTN, Cervical CA s/p chemotherapy, deafness.  In 2012, she was admitted with acute viral gastroenteritis c/b dehydration and AKI.  CEs were elevated at that time and LHC demonstrated normal coronary arteries.  Recently admitted 8/10-8/17 with L MCA stroke with assoc aphasia and R sided weakness.  TEE was neg for clot.  Patient had mildly elevated troponins and was seen by cardiology.  Echo demonstrated normal LVF but there were new WMA in the inferior wall.  Invasive evaluation was felt to be too high risk with her recent CVA.  OP nuclear study was recommended after recovery from her stroke.  During her admission, she complained of L foot pain (noted to have ischemic L 5th toe) and was ultimately seen by [facility].  LE angiogram demonstrated a chronically occluded L CIA.  Best option for revascularization was felt to be R>L Fem-Fem BPG.  However, she would be very high risk with recent CVA and NSTEMI and increased risk for wound healing and graft infection.  Conservative Rx was recommended.  She was seen by EP and ILR ([employer]tr[employer] Reveal LINQ) was implanted.  She was DC to inpatient rehab and remained there 8/17-8/28.  Unfortunately, she returned to the ED 9/6 with an acute LLE DVT.  She was placed on Xarelto.     She returns for FU.  She is here with her grandson, who is a Therapist, sports.  She continues to have significant L foot pain.  He tells me that she is pushing to get vascular surgery done.  However, with her recent CVA and her DVT, she would be high risk for surgery.  She denies chest pain, dyspnea, syncope, orthopnea, PND, edema.  BPs at home tend to run 150s/80s.     Studies:  - LHC (12/12):  Normal coronary arteries;  LVEDP 8  - Echo (8/15):  Mild LVH, EF 55%, basal inf and inf-lat HK and mid to apical IL AK, Gr 1 DD, normal RVF  - Carotid US (03/06/14):  Bilateral ICA 1-39%  - Peripheral A-gram (8/15):  R EIA 40%;  L CIA chronically occluded    Recent Labs/Images: 03/05/2014: HDL Cholesterol by NMR 48; LDL (calc) 122*  03/13/2014: ALT 17  04/01/2014: Creatinine 1.13*; Hemoglobin 12.7; Potassium 4.1   LE Venous Duplex (04/01/14):  Summary:  There is non occlusive, acute DVT noted in the left femoral and popliteal veins. There is acute superficial thrombosis noted in the left greater saphenous vein, distal thigh and popliteal areas.   Wt Readings from Last 3 Encounters:  04/10/14 186 lb (84.369 kg)  04/04/14 190 lb (86.183 kg)  04/01/14 199 lb (90.266 kg)     Past Medical History  Diagnosis Date  . Cancer     cervical; was on chemo; has been in remission for 39yrs  . STEMI (ST elevation myocardial infarction) 06/2011  . Acute kidney injury 07/06/2011  . Hypertension   . HOH (hard of hearing)   . CVA (cerebral infarction)   . PAD (peripheral artery disease) 04/10/2014    Current Outpatient Prescriptions  Medication Sig Dispense Refill  . acetaminophen (TYLENOL) 500 MG tablet Take 500 mg by mouth every 6 (six) hours as  needed for mild pain.      Marland Kitchen amLODipine (NORVASC) 5 MG tablet Take 5 mg by mouth daily.      Marland Kitchen aspirin 81 MG chewable tablet Chew 81 mg by mouth daily.      Marland Kitchen atorvastatin (LIPITOR) 40 MG tablet Take 40 mg by mouth daily at 6 PM.      . docusate sodium 100 MG CAPS Take 100 mg by mouth 2 (two) times daily.  30 capsule  0  . metoprolol tartrate (LOPRESSOR) 25 MG tablet Take 0.5 tablets (12.5 mg total) by mouth 2 (two) times daily.  30 tablet  1  . ondansetron (ZOFRAN) 4 MG tablet Take 1 tablet (4 mg total) by mouth every 8 (eight) hours as needed for nausea or vomiting.  20 tablet  0  . oxyCODONE 10 MG TABS Take 1-1.5 tablets (10-15 mg total) by mouth every 6 (six) hours as needed for  severe pain.  90 tablet  0  . oxyCODONE-acetaminophen (PERCOCET) 10-325 MG per tablet Take 1 tablet by mouth every 6 (six) hours as needed for pain.  30 tablet  0  . XARELTO STARTER PACK 15 & 20 MG TBPK Take 15-20 mg by mouth as directed. Take as directed on package: Start with one 15mg  tablet by mouth twice a day with food. On Day 22, switch to one 20mg  tablet once a day with food.  51 each  0   No current facility-administered medications for this visit.     Allergies:   Review of patient's allergies indicates no known allergies.   Social History:  The patient  reports that she has quit smoking. Her smoking use included Cigarettes. She has a 1 pack-year smoking history. She quit smokeless tobacco use about 2 years ago. She reports that she drinks alcohol. She reports that she does not use illicit drugs.   Family History:  The patient's family history includes Intracerebral hemorrhage in her daughter; Stroke in her daughter. There is no history of Heart attack.   ROS:  Please see the history of present illness.   No bleeding problems.   All other systems reviewed and negative.   PHYSICAL EXAM: VS:  BP 130/90  Pulse 107  Ht 5\' 3"  (1.6 m)  Wt 186 lb (84.369 kg)  BMI 32.96 kg/m2 Chronically ill appearing female in a wheelchair, in no acute distress but tearful at times  HEENT: normal Neck: no JVD at 90 degrees Cardiac:  normal S1, S2; RRR; no murmur Lungs:  clear to auscultation bilaterally, no wheezing, rhonchi or rales Abd: soft, nontender, no hepatomegaly Ext: no edemahard shoe on L foot Skin: warm and dry Neuro:  CNs 2-12 intact, no focal abnormalities noted  EKG:  Sinus tachy, HR 107, RBBB, LAFB     ASSESSMENT AND PLAN:  1. Elevated troponin:  She has a hx of normal coronary arteries by fairly recent LHC.  Elevated CEs may have been related to CVA.  However, she did have new WMA on echo.  She is really pushing to get vascular surgery done.  If Dr. Scot Dock agrees, she will need  cardiac clearance. I will arrange a Lexiscan Myoview.  I reviewed this with her son today who agreed.  Increase Metoprolol to 25 mg bid.  Continue ASA, statin.  2. History of CVA (cerebrovascular accident):  Continue ASA, statin.  ILR interrogated today.  No AFib identified.  FU with neurology as planned.  3. Essential hypertension:  Increase Metoprolol as noted.  If BP  remains high, we can increase this further or increase Amlodipine to 10 QD.  4. Dyslipidemia:  Continue statin.  5. PAD (peripheral artery disease):  FU with VVS as planned.  6. DVT (deep venous thrombosis), left:  She will likely need AC for at least 6 mos.  She is tolerating Xarelto.  She can DC ASA.  Disposition:  FU with Dr. Casandra Doffing or me in 3-4 weeks.   Signed, Versie Starks, MHS 04/10/2014 10:30 AM    [facility] [address], [place], [place]  [address] Phone: [phone]; Fax: [phone]

## 2014-04-10 NOTE — Patient Instructions (Signed)
STOP ASPIRIN  INCREASE METOPROLOL TO 25 MG 1 TABLET TWICE DAILY; NEW RX SENT IN TODAY  Your physician has requested that you have a lexiscan myoview. For further information please visit [email or web address]. Please follow instruction sheet, as given.  Your physician recommends that you schedule a follow-up appointment in: 3-4 [place], [place] IS IN THE OFFICE

## 2014-04-11 ENCOUNTER — Ambulatory Visit (INDEPENDENT_AMBULATORY_CARE_PROVIDER_SITE_OTHER): Payer: Medicare Other | Admitting: *Deleted

## 2014-04-11 ENCOUNTER — Ambulatory Visit: Payer: Medicare Other | Admitting: Physician Assistant

## 2014-04-11 DIAGNOSIS — I639 Cerebral infarction, unspecified: Secondary | ICD-10-CM

## 2014-04-11 DIAGNOSIS — I6350 Cerebral infarction due to unspecified occlusion or stenosis of unspecified cerebral artery: Secondary | ICD-10-CM

## 2014-04-11 LAB — MDC_IDC_ENUM_SESS_TYPE_INCLINIC
MDC IDC SESS DTM: 20150915142950
Zone Setting Detection Interval: 2000 ms
Zone Setting Detection Interval: 3000 ms
Zone Setting Detection Interval: 360 ms

## 2014-04-11 LAB — MDC_IDC_ENUM_SESS_TYPE_REMOTE

## 2014-04-11 NOTE — Progress Notes (Signed)
Loop check in clinic with Richardson Dopp, PA. Wound appears well healed.  Pt with 0 tachy episodes; 0 brady episodes; 0 asystole; 0 AF episodes. Plan to follow up via [facility] and with JA PRN.

## 2014-04-11 NOTE — Addendum Note (Signed)
Addended by: Shiela Mayer on: 04/11/2014 09:16 AM   Modules accepted: Level of Service

## 2014-04-17 ENCOUNTER — Encounter: Payer: Self-pay | Admitting: Internal Medicine

## 2014-04-17 NOTE — Progress Notes (Signed)
Loop recorder 

## 2014-04-19 ENCOUNTER — Ambulatory Visit (INDEPENDENT_AMBULATORY_CARE_PROVIDER_SITE_OTHER): Payer: Medicare Other | Admitting: Medical

## 2014-04-19 ENCOUNTER — Encounter: Payer: Self-pay | Admitting: Medical

## 2014-04-19 VITALS — BP 163/89 | HR 97 | Temp 98.6°F | Ht 63.0 in | Wt 184.6 lb

## 2014-04-19 DIAGNOSIS — M25572 Pain in left ankle and joints of left foot: Secondary | ICD-10-CM

## 2014-04-19 DIAGNOSIS — E785 Hyperlipidemia, unspecified: Secondary | ICD-10-CM

## 2014-04-19 DIAGNOSIS — M79605 Pain in left leg: Secondary | ICD-10-CM

## 2014-04-19 DIAGNOSIS — F32A Depression, unspecified: Secondary | ICD-10-CM | POA: Insufficient documentation

## 2014-04-19 DIAGNOSIS — I999 Unspecified disorder of circulatory system: Secondary | ICD-10-CM

## 2014-04-19 DIAGNOSIS — I6350 Cerebral infarction due to unspecified occlusion or stenosis of unspecified cerebral artery: Secondary | ICD-10-CM

## 2014-04-19 DIAGNOSIS — I998 Other disorder of circulatory system: Secondary | ICD-10-CM

## 2014-04-19 DIAGNOSIS — M79609 Pain in unspecified limb: Secondary | ICD-10-CM

## 2014-04-19 DIAGNOSIS — I10 Essential (primary) hypertension: Secondary | ICD-10-CM

## 2014-04-19 DIAGNOSIS — I639 Cerebral infarction, unspecified: Secondary | ICD-10-CM

## 2014-04-19 DIAGNOSIS — R748 Abnormal levels of other serum enzymes: Secondary | ICD-10-CM

## 2014-04-19 DIAGNOSIS — F3289 Other specified depressive episodes: Secondary | ICD-10-CM

## 2014-04-19 DIAGNOSIS — C539 Malignant neoplasm of cervix uteri, unspecified: Secondary | ICD-10-CM

## 2014-04-19 DIAGNOSIS — F329 Major depressive disorder, single episode, unspecified: Secondary | ICD-10-CM

## 2014-04-19 DIAGNOSIS — M25579 Pain in unspecified ankle and joints of unspecified foot: Secondary | ICD-10-CM

## 2014-04-19 HISTORY — DX: Depression, unspecified: F32.A

## 2014-04-19 MED ORDER — HYDROCODONE-ACETAMINOPHEN 7.5-325 MG PO TABS: 1.0000 | ORAL_TABLET | Freq: Four times a day (QID) | ORAL | Status: DC | PRN

## 2014-04-19 MED ORDER — RIVAROXABAN 20 MG PO TABS: 20.0000 mg | ORAL_TABLET | Freq: Every day | ORAL | Status: DC

## 2014-04-19 MED ORDER — AMLODIPINE BESYLATE 10 MG PO TABS: 10.0000 mg | ORAL_TABLET | Freq: Every day | ORAL | Status: DC

## 2014-04-19 MED ORDER — DULOXETINE HCL 30 MG PO CPEP: 30.0000 mg | ORAL_CAPSULE | Freq: Every day | ORAL | Status: DC

## 2014-04-19 NOTE — Assessment & Plan Note (Signed)
Recent MI. Pt following up with cardiologist in 4 days.

## 2014-04-19 NOTE — Assessment & Plan Note (Signed)
Pt treated for in 2009.

## 2014-04-19 NOTE — Assessment & Plan Note (Signed)
She is crying often since stroke. Frontal lobe involvement per daughter. Will rx cymbalta this will hopefully help with her leg pain as well.

## 2014-04-19 NOTE — Patient Instructions (Signed)
Good to meet you both,  Continue with current medications. But making some changes.  For your htn, I am increasing your norvasc to 10 mg q day.  For your leg pain. Stop oxycodone(your daughter will separate this medication  from med you are taking) and start hydrocodone. Also will give cymbalta. This may help with pain and help with her depression.  I am giving rx of xarelto. Will give rx for 2 months more month at least.(after you finish starter pack.)  I want you to get labs today.  Follow up in 1 month with myself or sooner(other providers are available if needed). Also will eventually want you to meet Dr. Etter Sjogren my Supervisor.

## 2014-04-19 NOTE — Assessment & Plan Note (Signed)
Refilled her xarelto today. She will fill after she uses the starter pack. I did review specialist note.She will follow up with me in one month.

## 2014-04-19 NOTE — Progress Notes (Signed)
Subjective:    Patient ID: Traci Mclaughlin, female    DOB: 03-24-1951, 63 y.o.   MRN: [id number]  HPI  New pt pmh, psh, fh reviewed. First time here. No primary care.  Pt cancer hx. Cervical. She had chemo and radiation per daughter. 2007 and 2009.  Hypertension. Last 2 months diagnosed. Mild elevated now. No reported chest pain or neuro signs or symptoms.  Some kidney disease renal insuffiency.   MI around August 10th. She was admitted.(Pt has loop monitor and wil see cardiologist on Monday).She was not having classic symptoms. Around that speech was effected. Then diagnosed with stroke. Pt was doing PT. She is going through speech therapy.  Pt left leg hurts. She was diagnosed with DVT. Daughter convinced although I down see this in imaging section. Pt is on xarelto. She is on 15 mg q day. About 2 wks away from finishing starter pack.  Blockage of both iliac common and external arteries of legs. Seeing vascular surgeon but not stable enough to get surgery yet.  Pt no diabetes.  Leg pain. Severe per pt. Daughter wants me to refill oxycodone. Ptcan't express that in pain or explain. Daugher note she cries on the time.  Pt depressed and crying a lot.            Review of Systems  Constitutional: Negative for fever, chills, diaphoresis and fatigue.       Pt unable to speak. Due to stroke. Cries during exam. Stroke affected frontal area and lt sided as well. Daughter give history.  HENT: Negative.   Respiratory: Negative for cough, choking, chest tightness, shortness of breath and wheezing.   Cardiovascular: Negative for chest pain and palpitations.  Gastrointestinal: Negative.   Genitourinary: Negative.   Musculoskeletal: Negative.   Skin: Negative.   Neurological: Positive for speech difficulty. Negative for dizziness, tremors, seizures, syncope, facial asymmetry, weakness, numbness and headaches.       Cries a lot on exam.  Hematological: Negative for adenopathy.  Does not bruise/bleed easily.  Psychiatric/Behavioral: Positive for behavioral problems and dysphoric mood. Negative for suicidal ideas, hallucinations, confusion, sleep disturbance and agitation. The patient is not nervous/anxious.        Pt can't communicate. Cries and had frontal lobe effected by stroke per daughter and was left sided as well.       Objective:   Physical Exam  Constitutional: She appears well-developed and well-nourished.  HENT:  Head: Normocephalic and atraumatic.  Right Ear: External ear normal.  Left Ear: External ear normal.  Eyes: Conjunctivae are normal. Pupils are equal, round, and reactive to light.  Neck: Normal range of motion. Neck supple. No JVD present. No tracheal deviation present. No thyromegaly present.  Cardiovascular: Normal rate, regular rhythm and normal heart sounds.   Pulmonary/Chest: Effort normal and breath sounds normal. No stridor. No respiratory distress. She has no wheezes. She has no rales. She exhibits no tenderness.  Abdominal: Soft. Bowel sounds are normal. She exhibits no distension and no mass. There is no tenderness. There is no rebound and no guarding.  Musculoskeletal:  Lt foot in boot.(no fx per daughter). Given for positioning.  Lymphadenopathy:    She has no cervical adenopathy.  Neurological: No cranial nerve deficit.  Appears not to be oriented. Impossible to assess her status. She is unable to speak. But does seem to follow directions of daugher.  CN III- XII grossly intact. I can't appreciate any strength difference of upper ext. Rt lower ext 5/5 strength. Left  side can't test. She winces and cries when I even attempt to grab or come close to.(This leg has hurt since stroke. Some blockage of femoral arteries both side.)  Psychiatric: She has a normal mood and affect. Her behavior is normal. Judgment and thought content normal.          Assessment & Plan:

## 2014-04-19 NOTE — Assessment & Plan Note (Signed)
Pt is on lipitor.

## 2014-04-19 NOTE — Assessment & Plan Note (Signed)
Pt is going to speech therapy. She could not tolerate PT. No new deficits. Daughter reports none. Speech is her primary issue. 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

## 2014-04-19 NOTE — Assessment & Plan Note (Addendum)
I increased her amlodipine to 10 mg q day. Most of the time her bp is 150/90 per daughter. One day past week was 200/100. So I did increase her dose of amlodipine. She is also on b- blocker. She will continue same dose.

## 2014-04-19 NOTE — Assessment & Plan Note (Signed)
Since the surgery. Unclear cause. No fracture or injury per pt. She has some ischemia of leg due to iliac artery partial occlusion. Vascular following. ED had given her oxycodine. I think this is too strong. Advised to stop and give norco. Also will give cymbalta. This may help with pain and help her mood.  Note vascular is following and daughter educated on complication from her vascular condition and what to watch for.

## 2014-04-20 LAB — COMPREHENSIVE METABOLIC PANEL
ALBUMIN: 3.1 g/dL — AB (ref 3.5–5.2)
ALT: 43 U/L — AB (ref 0–35)
AST: 41 U/L — AB (ref 0–37)
Alkaline Phosphatase: 52 U/L (ref 39–117)
BUN: 25 mg/dL — ABNORMAL HIGH (ref 6–23)
CALCIUM: 9.8 mg/dL (ref 8.4–10.5)
CHLORIDE: 102 meq/L (ref 96–112)
CO2: 24 meq/L (ref 19–32)
CREATININE: 1.2 mg/dL (ref 0.4–1.2)
GFR: 56.64 mL/min — AB (ref >60.00)
Glucose, Bld: 105 mg/dL — ABNORMAL HIGH (ref 70–99)
POTASSIUM: 3.7 meq/L (ref 3.5–5.1)
Sodium: 136 mEq/L (ref 135–145)
Total Bilirubin: 0.5 mg/dL (ref 0.2–1.2)
Total Protein: 8.3 g/dL (ref 6.0–8.3)

## 2014-04-20 LAB — CBC WITH DIFFERENTIAL/PLATELET
Basophils Absolute: 0 10*3/uL (ref 0.0–0.1)
Basophils Relative: 0.2 % (ref 0.0–3.0)
Eosinophils Absolute: 0.1 10*3/uL (ref 0.0–0.7)
Eosinophils Relative: 0.4 % (ref 0.0–5.0)
HCT: 33.4 % — ABNORMAL LOW (ref 36.0–46.0)
Hemoglobin: 11.1 g/dL — ABNORMAL LOW (ref 12.0–15.0)
Lymphocytes Relative: 9.9 % — ABNORMAL LOW (ref 12.0–46.0)
Lymphs Abs: 1.4 10*3/uL (ref 0.7–4.0)
MCHC: 33.1 g/dL (ref 30.0–36.0)
MCV: 97.7 fl (ref 78.0–100.0)
Monocytes Absolute: 0.8 10*3/uL (ref 0.1–1.0)
Monocytes Relative: 5.8 % (ref 3.0–12.0)
Neutro Abs: 11.9 10*3/uL — ABNORMAL HIGH (ref 1.4–7.7)
Neutrophils Relative %: 83.7 % — ABNORMAL HIGH (ref 43.0–77.0)
Platelets: 430 10*3/uL — ABNORMAL HIGH (ref 150.0–400.0)
RBC: 3.42 Mil/uL — ABNORMAL LOW (ref 3.87–5.11)
RDW: 13 % (ref 11.5–15.5)
WBC: 14.2 10*3/uL — ABNORMAL HIGH (ref 4.0–10.5)

## 2014-04-21 ENCOUNTER — Telehealth: Payer: Self-pay | Admitting: Medical

## 2014-04-21 NOTE — Telephone Encounter (Signed)
Pt infection fighting cells are moderate high at 14. Mild anemic. You will have to talk with daughter since she had stroke and can't talk. I want them make appointment this coming week. Want to recheck her lungs, check her foot which she has some ischemia. Make sure no infection in that foot and get urine sample. Please notify them and document in the chart to that effect. That you notified them.

## 2014-04-23 ENCOUNTER — Ambulatory Visit (HOSPITAL_COMMUNITY): Payer: Medicare Other | Attending: Medical | Admitting: Radiology

## 2014-04-23 ENCOUNTER — Inpatient Hospital Stay: Payer: Self-pay | Admitting: Physical Medicine & Rehabilitation

## 2014-04-23 VITALS — BP 136/73 | Ht 63.0 in | Wt 185.0 lb

## 2014-04-23 DIAGNOSIS — R0609 Other forms of dyspnea: Secondary | ICD-10-CM | POA: Insufficient documentation

## 2014-04-23 DIAGNOSIS — R5381 Other malaise: Secondary | ICD-10-CM | POA: Diagnosis not present

## 2014-04-23 DIAGNOSIS — I959 Hypotension, unspecified: Secondary | ICD-10-CM | POA: Diagnosis not present

## 2014-04-23 DIAGNOSIS — I10 Essential (primary) hypertension: Secondary | ICD-10-CM | POA: Insufficient documentation

## 2014-04-23 DIAGNOSIS — R0602 Shortness of breath: Secondary | ICD-10-CM | POA: Insufficient documentation

## 2014-04-23 DIAGNOSIS — R0989 Other specified symptoms and signs involving the circulatory and respiratory systems: Principal | ICD-10-CM | POA: Insufficient documentation

## 2014-04-23 DIAGNOSIS — I4510 Unspecified right bundle-branch block: Secondary | ICD-10-CM | POA: Insufficient documentation

## 2014-04-23 DIAGNOSIS — R9439 Abnormal result of other cardiovascular function study: Secondary | ICD-10-CM | POA: Diagnosis not present

## 2014-04-23 DIAGNOSIS — R778 Other specified abnormalities of plasma proteins: Secondary | ICD-10-CM

## 2014-04-23 DIAGNOSIS — Z8673 Personal history of transient ischemic attack (TIA), and cerebral infarction without residual deficits: Secondary | ICD-10-CM | POA: Diagnosis not present

## 2014-04-23 DIAGNOSIS — R51 Headache: Secondary | ICD-10-CM | POA: Insufficient documentation

## 2014-04-23 DIAGNOSIS — R5383 Other fatigue: Secondary | ICD-10-CM | POA: Diagnosis not present

## 2014-04-23 DIAGNOSIS — R9431 Abnormal electrocardiogram [ECG] [EKG]: Secondary | ICD-10-CM

## 2014-04-23 DIAGNOSIS — R7989 Other specified abnormal findings of blood chemistry: Secondary | ICD-10-CM | POA: Insufficient documentation

## 2014-04-23 DIAGNOSIS — I779 Disorder of arteries and arterioles, unspecified: Secondary | ICD-10-CM | POA: Diagnosis not present

## 2014-04-23 DIAGNOSIS — Z86718 Personal history of other venous thrombosis and embolism: Secondary | ICD-10-CM | POA: Diagnosis not present

## 2014-04-23 MED ORDER — TECHNETIUM TC 99M SESTAMIBI GENERIC - CARDIOLITE
33.0000 | Freq: Once | INTRAVENOUS | Status: AC | PRN
  Administered 2014-04-23: 33 via INTRAVENOUS

## 2014-04-23 NOTE — Progress Notes (Signed)
[facility] [address] [place], [place] [address] [phone]    Cardiology Nuclear Med Study  Traci Mclaughlin is a 63 y.o. female     MRN : [id number]     DOB: 1951/06/03  Procedure Date: 04/24/2014  Nuclear Med Background Indication for Stress Test:  Evaluation for Ischemia, Patient seen in hospital on 03-05-2014 for CVA mildly elevated troponin secondary to CVA, readmitted on 03-31-2014 acute LLE DVT, and Pending possible Surgical Clearance for  Vascular Surgery by Deitra Mayo, MD History:  03/12/14 Loop Recorder 8/15 CVA DVT Cardiac Risk Factors: Carotid Disease, Hypertension and RBBB  Symptoms:  DOE, Fatigue and SOB   Nuclear Pre-Procedure Caffeine/Decaff Intake:  None NPO After: 9:00am   Lungs:  clear O2 Sat: 96% on room air. IV 0.9% NS with Angio Cath:  22g  IV Site: R Hand  IV Started by:  Matilde Haymaker, RN  Chest Size (in):  36 Cup Size: C  Height: 5\' 3"  (1.6 m)  Weight:  185 lb (83.915 kg)  BMI:  Body mass index is 32.78 kg/(m^2). Tech Comments:  Lopressor taken at 0900. Aminophylline given for symptoms. All were resolved before leaving. S.Williams EMTP    Nuclear Med Study 1 or 2 day study: 2 day  Stress Test Type:  Lexiscan  Reading MD: n/a  Order Authorizing Provider:  Rowan Blase and [facility]  Resting Radionuclide: Technetium 2m Sestamibi  Resting Radionuclide Dose: 33.0 mCi on 04/23/2014   Stress Radionuclide:  Technetium 58m Sestamibi  Stress Radionuclide Dose: 33.0 mCi on 04/24/2014          Stress Protocol Rest HR: 83 Stress HR: 96  Rest BP: 136/73 Stress BP: 155/72  Exercise Time (min): n/a METS: n/a   Predicted Max HR: 157 bpm % Max HR: 61.15 bpm Rate Pressure Product: 14880   Dose of Adenosine (mg):  n/a Dose of Lexiscan: 0.4 mg  Dose of Atropine (mg): n/a Dose of Dobutamine: n/a mcg/kg/min (at max HR)  Stress Test Technologist: Perrin Maltese, EMT-P  Nuclear Technologist:  Earl Many, CNMT     Rest Procedure:  Myocardial perfusion imaging was performed at rest 45 minutes following the intravenous administration of Technetium 61m Sestamibi. Rest ECG: NSR-RBBB  Stress Procedure:  The patient received IV Lexiscan 0.4 mg over 15-seconds.  Technetium 44m Sestamibi injected at 30-seconds. This patient had a headache and leg pain with the Lexiscan injection. Quantitative spect images were obtained after a 45 minute delay. Stress ECG: No significant change from baseline ECG  QPS Raw Data Images:  Normal; no motion artifact; normal heart/lung ratio. Stress Images:  Decreased uptake at the apex and inferior wall Rest Images:  Decreased apical uptake Subtraction (SDS):  These findings are consistent with ischemia. Transient Ischemic Dilatation (Normal <1.22):  0.93 Lung/Heart Ratio (Normal <0.45):  0.22  Quantitative Gated Spect Images QGS EDV:  131 ml QGS ESV:  85 ml  Impression Exercise Capacity:  Lexiscan with no exercise. BP Response:  Hypotensive blood pressure response. Clinical Symptoms:  No significant symptoms noted. ECG Impression:  No significant ECG changes with Lexiscan. Comparison with Prior Nuclear Study: No previous nuclear study performed  Overall Impression:  High risk stress nuclear study with patchy tracer uptake. Dilated LV with apical truncation artifact. There is a large sized (Extent 46%) moderate inferior reversibility (SDS 15) noted.  LV Ejection Fraction: 35%.  LV Wall Motion:  Global hypokinesis  Pixie Casino, MD, [facility] Board Certified in Nuclear  Cardiology Attending Cardiologist [place]

## 2014-04-23 NOTE — Telephone Encounter (Signed)
Called patients daughter notified of lab results and the need for patient to be seen this week to recheck lungs,her foot and urine. Daughter states she will call back and make appoint for mother.

## 2014-04-24 ENCOUNTER — Ambulatory Visit (HOSPITAL_COMMUNITY): Payer: Medicare Other | Attending: Cardiology

## 2014-04-24 DIAGNOSIS — M79609 Pain in unspecified limb: Secondary | ICD-10-CM | POA: Diagnosis not present

## 2014-04-24 DIAGNOSIS — Z86718 Personal history of other venous thrombosis and embolism: Secondary | ICD-10-CM | POA: Diagnosis not present

## 2014-04-24 DIAGNOSIS — I252 Old myocardial infarction: Secondary | ICD-10-CM | POA: Diagnosis not present

## 2014-04-24 DIAGNOSIS — E785 Hyperlipidemia, unspecified: Secondary | ICD-10-CM | POA: Insufficient documentation

## 2014-04-24 DIAGNOSIS — I739 Peripheral vascular disease, unspecified: Secondary | ICD-10-CM | POA: Diagnosis not present

## 2014-04-24 DIAGNOSIS — R0989 Other specified symptoms and signs involving the circulatory and respiratory systems: Secondary | ICD-10-CM

## 2014-04-24 DIAGNOSIS — Z7901 Long term (current) use of anticoagulants: Secondary | ICD-10-CM | POA: Diagnosis not present

## 2014-04-24 DIAGNOSIS — I10 Essential (primary) hypertension: Secondary | ICD-10-CM | POA: Diagnosis not present

## 2014-04-24 DIAGNOSIS — R9439 Abnormal result of other cardiovascular function study: Secondary | ICD-10-CM | POA: Diagnosis not present

## 2014-04-24 DIAGNOSIS — R748 Abnormal levels of other serum enzymes: Secondary | ICD-10-CM | POA: Insufficient documentation

## 2014-04-24 DIAGNOSIS — Z8673 Personal history of transient ischemic attack (TIA), and cerebral infarction without residual deficits: Secondary | ICD-10-CM | POA: Insufficient documentation

## 2014-04-24 MED ORDER — REGADENOSON 0.4 MG/5ML IV SOLN
0.4000 mg | Freq: Once | INTRAVENOUS | Status: AC
  Administered 2014-04-24: 0.4 mg via INTRAVENOUS

## 2014-04-24 MED ORDER — AMINOPHYLLINE 25 MG/ML IV SOLN
75.0000 mg | Freq: Two times a day (BID) | INTRAVENOUS | Status: DC | PRN
  Administered 2014-04-24: 75 mg via INTRAVENOUS

## 2014-04-24 MED ORDER — TECHNETIUM TC 99M SESTAMIBI GENERIC - CARDIOLITE
33.0000 | Freq: Once | INTRAVENOUS | Status: AC | PRN
  Administered 2014-04-24: 33 via INTRAVENOUS

## 2014-04-25 ENCOUNTER — Encounter: Payer: Self-pay | Admitting: Physician Assistant

## 2014-04-26 ENCOUNTER — Encounter: Payer: Self-pay | Admitting: Medical

## 2014-04-26 ENCOUNTER — Ambulatory Visit (INDEPENDENT_AMBULATORY_CARE_PROVIDER_SITE_OTHER): Payer: Medicare Other | Admitting: Medical

## 2014-04-26 ENCOUNTER — Encounter: Payer: Self-pay | Admitting: Internal Medicine

## 2014-04-26 VITALS — BP 156/95 | HR 107 | Temp 98.7°F | Ht 63.0 in | Wt 180.4 lb

## 2014-04-26 DIAGNOSIS — I998 Other disorder of circulatory system: Secondary | ICD-10-CM

## 2014-04-26 DIAGNOSIS — D72829 Elevated white blood cell count, unspecified: Secondary | ICD-10-CM | POA: Insufficient documentation

## 2014-04-26 HISTORY — PX: HX COLONOSCOPY: 2100001147

## 2014-04-26 HISTORY — PX: VENA CAVA FILTER PLACEMENT: SHX1085

## 2014-04-26 LAB — CBC WITH DIFFERENTIAL/PLATELET
BASOS PCT: 0 % (ref 0.0–3.0)
Basophils Absolute: 0 10*3/uL (ref 0.0–0.1)
EOS ABS: 0.1 10*3/uL (ref 0.0–0.7)
EOS PCT: 0.4 % (ref 0.0–5.0)
HCT: 33.9 % — ABNORMAL LOW (ref 36.0–46.0)
Hemoglobin: 11.4 g/dL — ABNORMAL LOW (ref 12.0–15.0)
LYMPHS PCT: 14 % (ref 12.0–46.0)
Lymphs Abs: 2.1 10*3/uL (ref 0.7–4.0)
MCHC: 33.7 g/dL (ref 30.0–36.0)
MCV: 96.8 fl (ref 78.0–100.0)
Monocytes Absolute: 0.6 10*3/uL (ref 0.1–1.0)
Monocytes Relative: 3.9 % (ref 3.0–12.0)
NEUTROS PCT: 81.7 % — AB (ref 43.0–77.0)
Neutro Abs: 12.4 10*3/uL — ABNORMAL HIGH (ref 1.4–7.7)
Platelets: 472 10*3/uL — ABNORMAL HIGH (ref 150.0–400.0)
RBC: 3.5 Mil/uL — ABNORMAL LOW (ref 3.87–5.11)
RDW: 12.8 % (ref 11.5–15.5)
WBC: 15.2 10*3/uL — AB (ref 4.0–10.5)

## 2014-04-26 NOTE — Patient Instructions (Addendum)
We are contacting your vascular surgeon to see if they can see you tomorrow to evaluate your toe.  For your elevated wbc, I want to repeat your wbc and get a cxr today. Also I want you to give urine sample if possible after xray done.  We will call you today after we find out if vascular surgeon will see you tomorrow.

## 2014-04-26 NOTE — Progress Notes (Signed)
Subjective:    Patient ID: Traci Mclaughlin, female    DOB: Jul 06, 1951, 63 y.o.   MRN: [id number]  HPI   Pt had elevated wbc on labs. No fever, chills, or sweats. I wanted to follow up and recheck/re-examine her. Repeat cbc today. Also wanted to assess for possible sources of infection that being the lungs, urine, abdomen or her left 5th toe. No cough, no urinary symtpoms and no abdomen pain. Her left 5th toe and foot still hurts her a lot.  Pt has known ischemia of 5th toe. Surgery is currently pending. Cardiologist eval done recently to see if good candidate for surgery. Pt also has dvt of lt lower ext.  Past Medical History  Diagnosis Date  . STEMI (ST elevation myocardial infarction) 06/2011  . Acute kidney injury 07/06/2011  . Hypertension   . HOH (hard of hearing)   . CVA (cerebral infarction)   . PAD (peripheral artery disease) 04/10/2014  . Cancer     cervical; was on chemo; has been in remission for 13yrs  . Depression 04/19/2014  . Hx of cardiovascular stress test     Nuclear (9/15):  Inf ischemia, EF 35%; high risk    History   Social History  . Marital Status: Single    Spouse Name: N/A    Number of Children: N/A  . Years of Education: N/A   Occupational History  . Not on file.   Social History Main Topics  . Smoking status: Former Smoker -- 1.00 packs/day for 1 years    Types: Cigarettes  . Smokeless tobacco: Former Systems developer    Quit date: 06/16/2011  . Alcohol Use: Yes     Comment: occassional  . Drug Use: No  . Sexual Activity: Not on file   Other Topics Concern  . Not on file   Social History Narrative  . No narrative on file    Past Surgical History  Procedure Laterality Date  . Cardiac catheterization  07/03/2011    Dr. Irish Lack  . Tee without cardioversion N/A 03/08/2014    Procedure: TRANSESOPHAGEAL ECHOCARDIOGRAM (TEE);  Surgeon: Fay Records, MD;  Location: [facility] ENDOSCOPY;  Service: Cardiovascular;  Laterality: N/A;  . 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 Intracerebral hemorrhage Daughter     during childbirth  . Stroke Daughter   . Heart attack Neg Hx   . Diabetes Mother   . Alcohol abuse Mother   . Lung cancer Father   . COPD Father     No Known Allergies  Current Outpatient Prescriptions on File Prior to Visit  Medication Sig Dispense Refill  . amLODipine (NORVASC) 10 MG tablet Take 1 tablet (10 mg total) by mouth daily.  90 tablet  1  . atorvastatin (LIPITOR) 40 MG tablet Take 40 mg by mouth daily at 6 PM.      . docusate sodium 100 MG CAPS Take 100 mg by mouth 2 (two) times daily.  30 capsule  0  . DULoxetine (CYMBALTA) 30 MG capsule Take 1 capsule (30 mg total) by mouth daily.  30 capsule  3  . HYDROcodone-acetaminophen (NORCO) 7.5-325 MG per tablet Take 1 tablet by mouth every 6 (six) hours as needed for moderate pain or severe pain.  30 tablet  0  . metoprolol tartrate (LOPRESSOR) 25 MG tablet Take 1 tablet (25 mg total) by mouth  2 (two) times daily.  60 tablet  11  . ondansetron (ZOFRAN) 4 MG tablet Take 1 tablet (4 mg total) by mouth every 8 (eight) hours as needed for nausea or vomiting.  20 tablet  0  . rivaroxaban (XARELTO) 20 MG TABS tablet Take 1 tablet (20 mg total) by mouth daily with supper.  30 tablet  1  . XARELTO STARTER PACK 15 & 20 MG TBPK Take 15-20 mg by mouth as directed. Take as directed on package: Start with one 15mg  tablet by mouth twice a day with food. On Day 22, switch to one 20mg  tablet once a day with food.  51 each  0  . acetaminophen (TYLENOL) 500 MG tablet Take 500 mg by mouth every 6 (six) hours as needed for mild pain.      Marland Kitchen oxyCODONE 10 MG TABS Take 1-1.5 tablets (10-15 mg total) by mouth every 6 (six) hours as needed for severe pain.  90 tablet  0  . oxyCODONE-acetaminophen (PERCOCET) 10-325 MG per tablet Take 1 tablet by mouth every 6 (six) hours as needed for pain.  30  tablet  0   No current facility-administered medications on file prior to visit.    BP 156/95  Pulse 107  Temp(Src) 98.7 F (37.1 C) (Oral)  Ht 5\' 3"  (1.6 m)  Wt 180 lb 6.4 oz (81.829 kg)  BMI 31.96 kg/m2  SpO2 96%      Review of Systems  Constitutional: Negative for fever, chills and fatigue.  HENT: Negative.   Respiratory: Negative for cough, choking, chest tightness, shortness of breath and wheezing.   Cardiovascular: Negative for palpitations.  Gastrointestinal: Negative.   Genitourinary: Negative.   Skin:       Lt 5th toe discolored and dark worse than on discharge. Daughter just noted this on exam today. Feet have been checked daily by relative who is a Marine scientist.  Hematological: Negative for adenopathy. Does not bruise/bleed easily.  Psychiatric/Behavioral:       Not as tearful or crying today as compared to other day.       Objective:   Physical Exam  Constitutional: She appears well-developed and well-nourished. No distress.  HENT:  Head: Normocephalic and atraumatic.  Eyes: Conjunctivae and EOM are normal.  Neck: Normal range of motion. Neck supple.  Pulmonary/Chest: Effort normal and breath sounds normal. No respiratory distress. She has no wheezes. She has no rales. She exhibits no tenderness.  Abdominal: Soft. Bowel sounds are normal. She exhibits no distension and no mass. There is no tenderness. There is no rebound.  No suprabpubic pressure.  Musculoskeletal:  Very tender lt foot and 5th toe is tender(has sensation)  Neurological: She is alert.  Skin: She is not diaphoretic.  Lt 5th toe distal ascpect dark black/purple color on outside and inside of the toe.  The foot is not discolored.     Lt foot tender to touch. Also tip of her left 5th toe dark purplish black color.       Assessment & Plan:

## 2014-04-26 NOTE — Assessment & Plan Note (Signed)
Will get a cbc today repeat to see if better or worse. Get cxr to evaluate lungs and order of ua to be done. Pt initially could not get sample.   Will try to get pt in with vascular surgeon by tomorrow. May rx antibiotic for foot if delay with specilaist appointment.

## 2014-04-26 NOTE — Assessment & Plan Note (Signed)
Based on appearance and change reported by daughter will try to get a stat referral to vascular surgeon by tomorrow. I have daughter number to contact on that appointment date.

## 2014-04-27 ENCOUNTER — Ambulatory Visit: Payer: Medicare Other | Admitting: Family

## 2014-04-27 ENCOUNTER — Telehealth: Payer: Self-pay | Admitting: *Deleted

## 2014-04-27 MED ORDER — CLINDAMYCIN HCL 300 MG PO CAPS: 300.0000 mg | ORAL_CAPSULE | Freq: Three times a day (TID) | ORAL | Status: DC

## 2014-04-27 NOTE — Telephone Encounter (Signed)
Spoke with speech therapist Arrie Eastern and the POC is 2 weeks maybe 3.  They are trying to transition her to out patient but she is still having a lot of pain

## 2014-04-27 NOTE — Telephone Encounter (Signed)
Speak therapist Arrie Eastern would like verbal order to continue speech therapy with patient

## 2014-04-27 NOTE — Telephone Encounter (Signed)
Pt daughter wants her to be on antibiotic. I thought pt was seeing the surgeon today and I was deferring decision to him. But appointment is on Monday. So I will call in 3 days of clindamycin. Will notify pt daughter. Did talk with pt daughter and informed. She did have appointment today but they had to change it to Monday.

## 2014-04-27 NOTE — Telephone Encounter (Signed)
PA initiated for Xarelto 20 mg tablets on [email or web address]. Awaiting determination. JG//CMA

## 2014-04-30 ENCOUNTER — Ambulatory Visit (INDEPENDENT_AMBULATORY_CARE_PROVIDER_SITE_OTHER): Payer: Medicare Other | Admitting: *Deleted

## 2014-04-30 ENCOUNTER — Encounter: Payer: Self-pay | Admitting: Surgery

## 2014-04-30 ENCOUNTER — Inpatient Hospital Stay (HOSPITAL_COMMUNITY)
Admission: AD | Admit: 2014-04-30 | Discharge: 2014-05-08 | DRG: 240 | Disposition: A | Discharge status: Rehabilitation Facility | Payer: Medicare Other | Source: Ambulatory Visit | Attending: Internal Medicine | Admitting: Internal Medicine

## 2014-04-30 ENCOUNTER — Ambulatory Visit (INDEPENDENT_AMBULATORY_CARE_PROVIDER_SITE_OTHER): Payer: Medicare Other | Admitting: Surgery

## 2014-04-30 ENCOUNTER — Encounter (HOSPITAL_COMMUNITY): Payer: Self-pay | Admitting: *Deleted

## 2014-04-30 VITALS — BP 122/70 | HR 89 | Temp 98.3°F | Resp 16 | Ht 63.0 in | Wt 180.0 lb

## 2014-04-30 DIAGNOSIS — I252 Old myocardial infarction: Secondary | ICD-10-CM | POA: Diagnosis not present

## 2014-04-30 DIAGNOSIS — Z801 Family history of malignant neoplasm of trachea, bronchus and lung: Secondary | ICD-10-CM

## 2014-04-30 DIAGNOSIS — N183 Chronic kidney disease, stage 3 unspecified: Secondary | ICD-10-CM | POA: Diagnosis present

## 2014-04-30 DIAGNOSIS — M79605 Pain in left leg: Secondary | ICD-10-CM

## 2014-04-30 DIAGNOSIS — Z86718 Personal history of other venous thrombosis and embolism: Secondary | ICD-10-CM | POA: Diagnosis not present

## 2014-04-30 DIAGNOSIS — L97929 Non-pressure chronic ulcer of unspecified part of left lower leg with unspecified severity: Secondary | ICD-10-CM | POA: Diagnosis present

## 2014-04-30 DIAGNOSIS — I739 Peripheral vascular disease, unspecified: Secondary | ICD-10-CM

## 2014-04-30 DIAGNOSIS — G8191 Hemiplegia, unspecified affecting right dominant side: Secondary | ICD-10-CM

## 2014-04-30 DIAGNOSIS — I70245 Atherosclerosis of native arteries of left leg with ulceration of other part of foot: Secondary | ICD-10-CM | POA: Diagnosis present

## 2014-04-30 DIAGNOSIS — R32 Unspecified urinary incontinence: Secondary | ICD-10-CM | POA: Diagnosis not present

## 2014-04-30 DIAGNOSIS — I998 Other disorder of circulatory system: Secondary | ICD-10-CM | POA: Diagnosis present

## 2014-04-30 DIAGNOSIS — Z823 Family history of stroke: Secondary | ICD-10-CM | POA: Diagnosis not present

## 2014-04-30 DIAGNOSIS — Z833 Family history of diabetes mellitus: Secondary | ICD-10-CM

## 2014-04-30 DIAGNOSIS — I639 Cerebral infarction, unspecified: Secondary | ICD-10-CM | POA: Diagnosis present

## 2014-04-30 DIAGNOSIS — I4510 Unspecified right bundle-branch block: Secondary | ICD-10-CM | POA: Diagnosis present

## 2014-04-30 DIAGNOSIS — H9190 Unspecified hearing loss, unspecified ear: Secondary | ICD-10-CM | POA: Diagnosis present

## 2014-04-30 DIAGNOSIS — I82402 Acute embolism and thrombosis of unspecified deep veins of left lower extremity: Secondary | ICD-10-CM | POA: Diagnosis present

## 2014-04-30 DIAGNOSIS — Z7982 Long term (current) use of aspirin: Secondary | ICD-10-CM

## 2014-04-30 DIAGNOSIS — E876 Hypokalemia: Secondary | ICD-10-CM | POA: Diagnosis not present

## 2014-04-30 DIAGNOSIS — Z7901 Long term (current) use of anticoagulants: Secondary | ICD-10-CM

## 2014-04-30 DIAGNOSIS — I10 Essential (primary) hypertension: Secondary | ICD-10-CM | POA: Diagnosis present

## 2014-04-30 DIAGNOSIS — I6521 Occlusion and stenosis of right carotid artery: Secondary | ICD-10-CM | POA: Diagnosis present

## 2014-04-30 DIAGNOSIS — I129 Hypertensive chronic kidney disease with stage 1 through stage 4 chronic kidney disease, or unspecified chronic kidney disease: Secondary | ICD-10-CM | POA: Diagnosis present

## 2014-04-30 DIAGNOSIS — I2510 Atherosclerotic heart disease of native coronary artery without angina pectoris: Secondary | ICD-10-CM | POA: Diagnosis present

## 2014-04-30 DIAGNOSIS — I69351 Hemiplegia and hemiparesis following cerebral infarction affecting right dominant side: Secondary | ICD-10-CM | POA: Diagnosis present

## 2014-04-30 DIAGNOSIS — I25118 Atherosclerotic heart disease of native coronary artery with other forms of angina pectoris: Secondary | ICD-10-CM

## 2014-04-30 DIAGNOSIS — F329 Major depressive disorder, single episode, unspecified: Secondary | ICD-10-CM | POA: Diagnosis present

## 2014-04-30 DIAGNOSIS — Z87891 Personal history of nicotine dependence: Secondary | ICD-10-CM | POA: Diagnosis not present

## 2014-04-30 DIAGNOSIS — I959 Hypotension, unspecified: Secondary | ICD-10-CM | POA: Diagnosis not present

## 2014-04-30 DIAGNOSIS — I70248 Atherosclerosis of native arteries of left leg with ulceration of other part of lower left leg: Principal | ICD-10-CM | POA: Diagnosis present

## 2014-04-30 DIAGNOSIS — I2582 Chronic total occlusion of coronary artery: Secondary | ICD-10-CM | POA: Diagnosis present

## 2014-04-30 DIAGNOSIS — F32A Depression, unspecified: Secondary | ICD-10-CM | POA: Diagnosis present

## 2014-04-30 DIAGNOSIS — Z4781 Encounter for orthopedic aftercare following surgical amputation: Secondary | ICD-10-CM | POA: Diagnosis not present

## 2014-04-30 DIAGNOSIS — I420 Dilated cardiomyopathy: Secondary | ICD-10-CM | POA: Diagnosis present

## 2014-04-30 DIAGNOSIS — Z89612 Acquired absence of left leg above knee: Secondary | ICD-10-CM

## 2014-04-30 DIAGNOSIS — Z01810 Encounter for preprocedural cardiovascular examination: Secondary | ICD-10-CM

## 2014-04-30 HISTORY — DX: Unspecified right bundle-branch block: I45.10

## 2014-04-30 LAB — COMPREHENSIVE METABOLIC PANEL
ALT: 25 U/L (ref 0–35)
AST: 29 U/L (ref 0–37)
Albumin: 2.8 g/dL — ABNORMAL LOW (ref 3.5–5.2)
Alkaline Phosphatase: 65 U/L (ref 39–117)
Anion gap: 18 — ABNORMAL HIGH (ref 5–15)
BILIRUBIN TOTAL: 0.4 mg/dL (ref 0.3–1.2)
BUN: 17 mg/dL (ref 6–23)
CHLORIDE: 95 meq/L — AB (ref 96–112)
CO2: 22 meq/L (ref 19–32)
Calcium: 9.5 mg/dL (ref 8.4–10.5)
Creatinine, Ser: 0.94 mg/dL (ref 0.50–1.10)
GFR, EST AFRICAN AMERICAN: 73 mL/min — AB (ref >90)
GFR, EST NON AFRICAN AMERICAN: 63 mL/min — AB (ref >90)
Glucose, Bld: 112 mg/dL — ABNORMAL HIGH (ref 70–99)
Potassium: 3.9 mEq/L (ref 3.7–5.3)
Sodium: 135 mEq/L — ABNORMAL LOW (ref 137–147)
Total Protein: 8.2 g/dL (ref 6.0–8.3)

## 2014-04-30 LAB — MDC_IDC_ENUM_SESS_TYPE_REMOTE

## 2014-04-30 LAB — MAGNESIUM: MAGNESIUM: 1.6 mg/dL (ref 1.5–2.5)

## 2014-04-30 LAB — PHOSPHORUS: Phosphorus: 3.2 mg/dL (ref 2.3–4.6)

## 2014-04-30 MED ORDER — ONDANSETRON HCL 4 MG PO TABS: 4.0000 mg | ORAL_TABLET | Freq: Three times a day (TID) | ORAL | Status: DC | PRN

## 2014-04-30 MED ORDER — METOPROLOL TARTRATE 25 MG PO TABS
25.0000 mg | ORAL_TABLET | Freq: Two times a day (BID) | ORAL | Status: DC
  Administered 2014-05-01 – 2014-05-02 (×3): 25 mg via ORAL
  Filled 2014-04-30 (×4): qty 1

## 2014-04-30 MED ORDER — VANCOMYCIN HCL 10 G IV SOLR
1500.0000 mg | INTRAVENOUS | Status: AC
  Administered 2014-04-30 – 2014-05-06 (×7): 1500 mg via INTRAVENOUS
  Filled 2014-04-30 (×7): qty 1500

## 2014-04-30 MED ORDER — DULOXETINE HCL 30 MG PO CPEP
30.0000 mg | ORAL_CAPSULE | Freq: Every day | ORAL | Status: DC
  Administered 2014-05-01 – 2014-05-08 (×7): 30 mg via ORAL
  Filled 2014-04-30 (×9): qty 1

## 2014-04-30 MED ORDER — SODIUM CHLORIDE 0.9 % IJ SOLN
3.0000 mL | INTRAMUSCULAR | Status: DC | PRN
  Administered 2014-05-05: 3 mL via INTRAVENOUS

## 2014-04-30 MED ORDER — OXYCODONE HCL 5 MG PO TABS
10.0000 mg | ORAL_TABLET | Freq: Four times a day (QID) | ORAL | Status: DC | PRN
  Administered 2014-04-30 – 2014-05-01 (×3): 15 mg via ORAL
  Filled 2014-04-30 (×2): qty 3
  Filled 2014-04-30: qty 2
  Filled 2014-04-30: qty 3

## 2014-04-30 MED ORDER — SODIUM CHLORIDE 0.9 % IJ SOLN
3.0000 mL | Freq: Two times a day (BID) | INTRAMUSCULAR | Status: DC
  Administered 2014-04-30 – 2014-05-08 (×9): 3 mL via INTRAVENOUS

## 2014-04-30 MED ORDER — MORPHINE SULFATE 2 MG/ML IJ SOLN: 2.0000 mg | INTRAMUSCULAR | Status: DC | PRN

## 2014-04-30 MED ORDER — OXYCODONE-ACETAMINOPHEN 10-325 MG PO TABS
1.0000 | ORAL_TABLET | Freq: Four times a day (QID) | ORAL | Status: DC | PRN
  Filled 2014-04-30: qty 1

## 2014-04-30 MED ORDER — SODIUM CHLORIDE 0.9 % IV SOLN: 250.0000 mL | INTRAVENOUS | Status: DC | PRN

## 2014-04-30 MED ORDER — OXYCODONE-ACETAMINOPHEN 5-325 MG PO TABS
1.0000 | ORAL_TABLET | Freq: Four times a day (QID) | ORAL | Status: DC | PRN
  Administered 2014-05-03 – 2014-05-08 (×5): 1 via ORAL
  Filled 2014-04-30 (×5): qty 1

## 2014-04-30 MED ORDER — ACETAMINOPHEN 500 MG PO TABS: 500.0000 mg | ORAL_TABLET | Freq: Four times a day (QID) | ORAL | Status: DC | PRN

## 2014-04-30 MED ORDER — HEPARIN (PORCINE) IN NACL 100-0.45 UNIT/ML-% IJ SOLN
950.0000 [IU]/h | INTRAMUSCULAR | Status: DC
  Administered 2014-05-01: 950 [IU]/h via INTRAVENOUS
  Filled 2014-04-30: qty 250

## 2014-04-30 MED ORDER — ATORVASTATIN CALCIUM 40 MG PO TABS
40.0000 mg | ORAL_TABLET | Freq: Every day | ORAL | Status: DC
  Administered 2014-04-30 – 2014-05-08 (×9): 40 mg via ORAL
  Filled 2014-04-30 (×9): qty 1

## 2014-04-30 MED ORDER — PIPERACILLIN-TAZOBACTAM 3.375 G IVPB
3.3750 g | Freq: Three times a day (TID) | INTRAVENOUS | Status: AC
  Administered 2014-04-30 – 2014-05-07 (×19): 3.375 g via INTRAVENOUS
  Filled 2014-04-30 (×19): qty 50

## 2014-04-30 MED ORDER — OXYCODONE HCL 5 MG PO TABS
5.0000 mg | ORAL_TABLET | Freq: Four times a day (QID) | ORAL | Status: DC | PRN
  Administered 2014-05-02 – 2014-05-08 (×7): 5 mg via ORAL
  Filled 2014-04-30 (×7): qty 1

## 2014-04-30 NOTE — H&P (Addendum)
[facility] History and Physical  Kealey Kemmer NWG:[id number] DOB: 1951-06-04 DOA: 04/30/2014  Referring physician: Dr. Trula Slade PCP: [name], PA-C   Chief Complaint: LLE wound  HPI: Traci Mclaughlin is a 63 y.o. 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 Patient was started on oral antibiotics by PCP for the last 3 days. She was seen by DR Trula Slade today (day of admission at the clinic) who request admission for pain control and IV antibiotics.  Much of the history is obtained by EMR and patient's son as patient is hard of hearing. Reportedly patient developed left lower extremity wound 5 days ago. Has been on oral antibiotics the last 3 days with no improvement in condition. The problem has progressively gotten worse and as such patient presented to the clinic where she was referred for admission as listed above. Reportedly surgeon has been contacted and will evaluate patient in house   Review of Systems:  Unable to accurately assess as patient is hard of hearing and has difficulty understanding questions  Past Medical History  Diagnosis Date  . Acute kidney injury 07/06/2011  . Hypertension   . HOH (hard of hearing)   . CVA (cerebral infarction)   . PAD (peripheral artery disease) 04/10/2014  . Cancer     cervical; was on chemo; has been in remission for 44yrs  . Depression 04/19/2014  . Hx of cardiovascular stress test     Nuclear (9/15):  Inf ischemia, EF 35%; high risk  . Stroke Aug. 10, 2015  . STEMI (ST elevation myocardial infarction) 06/2011   Past Surgical History  Procedure Laterality Date  . Cardiac catheterization  07/03/2011    Dr. Irish Lack  . Tee without cardioversion N/A 03/08/2014    Procedure: TRANSESOPHAGEAL ECHOCARDIOGRAM (TEE);  Surgeon: Fay Records, MD;  Location: [facility] ENDOSCOPY;  Service: Cardiovascular;  Laterality: N/A;  . 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 Her smoking use included Cigarettes. She has a 1 pack-year smoking history. She quit smokeless tobacco use about 2 years ago. She reports that she drinks alcohol. She reports that she does not use illicit drugs.  No Known Allergies  Family History  Problem Relation Age of Onset  . Intracerebral hemorrhage Daughter     during childbirth  . Stroke Daughter   . Heart attack Neg Hx   . Diabetes Mother   . Alcohol abuse Mother   . Lung cancer Father   . COPD Father      Prior to Admission medications   Medication Sig Start Date End Date Taking? Authorizing Provider  acetaminophen (TYLENOL) 500 MG tablet Take 500 mg by mouth every 6 (six) hours as needed for mild pain.    Historical Provider, MD  amLODipine (NORVASC) 10 MG tablet Take 1 tablet (10 mg total) by mouth daily. 04/19/14   Meriam Sprague Saguier, PA-C  atorvastatin (LIPITOR) 40 MG tablet Take 40 mg by mouth daily at 6 PM. 03/23/14   Ivan Anchors Love, PA-C  clindamycin (CLEOCIN) 300 MG capsule Take 1 capsule (300 mg total) by mouth 3 (three) times daily. 04/27/14   [place], PA-C  docusate sodium 100 MG CAPS Take 100 mg by mouth 2 (two) times daily. 03/23/14   Bary Leriche, PA-C  DULoxetine (CYMBALTA) 30 MG capsule Take 1 capsule (30 mg total) by mouth daily. 04/19/14   Meriam Sprague Saguier, PA-C  HYDROcodone-acetaminophen (NORCO) 7.5-325 MG per tablet Take 1 tablet by mouth every 6 (six) hours as needed for moderate pain or severe pain. 04/19/14   Meriam Sprague Saguier, PA-C  metoprolol tartrate (LOPRESSOR) 25 MG tablet Take 1 tablet (25 mg total) by mouth 2 (two) times daily. 04/10/14   Liliane Shi, PA-C  ondansetron (ZOFRAN) 4 MG tablet Take 1 tablet (4 mg total) by mouth every 8 (eight) hours as needed for nausea or vomiting. 04/01/14   Varney Biles, MD  oxyCODONE 10 MG TABS Take 1-1.5 tablets (10-15 mg total) by  mouth every 6 (six) hours as needed for severe pain. 03/23/14   Bary Leriche, PA-C  oxyCODONE-acetaminophen (PERCOCET) 10-325 MG per tablet Take 1 tablet by mouth every 6 (six) hours as needed for pain. 04/01/14   Varney Biles, MD  rivaroxaban (XARELTO) 20 MG TABS tablet Take 1 tablet (20 mg total) by mouth daily with supper. 04/19/14   Meriam Sprague Saguier, PA-C  XARELTO STARTER PACK 15 & 20 MG TBPK Take 15-20 mg by mouth as directed. Take as directed on package: Start with one 15mg  tablet by mouth twice a day with food. On Day 22, switch to one 20mg  tablet once a day with food. 04/01/14   Varney Biles, MD   Physical Exam: Filed Vitals:   04/30/14 1613  BP: 118/81  Pulse: 100  Temp: 98.4 F (36.9 C)  TempSrc: Oral  Resp: 18  Height: 5\' 3"  (1.6 m)  Weight: 79.8 kg (175 lb 14.8 oz)  SpO2: 97%    Wt Readings from Last 3 Encounters:  04/30/14 79.8 kg (175 lb 14.8 oz)  04/30/14 81.647 kg (180 lb)  04/26/14 81.829 kg (180 lb 6.4 oz)    General:  Alert and awake, in nad Eyes: PERRL, normal lids, irises & conjunctiva ENT: difficulty with hearing, normal exterior lips & tongue Neck: no LAD, masses or thyromegaly Cardiovascular: RRR, no m/r/g. No LE edema. Respiratory: CTA bilaterally, no w/r/r. Normal respiratory effort. Abdomen: soft, nt, d Skin: LLE wound at left medial thigh with necrotic center and cellulitis Musculoskeletal: no clubbing Psychiatric: Difficult to assess due to limited response secondary to patient being hard of hearing and having difficulty understanding examiner. Neurologic: Right hemiparesis, hard of hearing           Labs on Admission:  Basic Metabolic Panel: No results found for this basename: NA, K, CL, CO2, GLUCOSE, BUN, CREATININE, CALCIUM, MG, PHOS,  in the last 168 hours Liver Function Tests: No results found for this basename: AST, ALT, ALKPHOS, BILITOT, PROT, ALBUMIN,  in the last 168 hours No results found for this basename: LIPASE, AMYLASE,  in the  last 168 hours No results found for this basename: AMMONIA,  in the last 168 hours CBC:  Recent Labs Lab 04/26/14 1141  WBC 15.2*  NEUTROABS 12.4*  HGB 11.4*  HCT 33.9*  MCV 96.8  PLT 472.0*   Cardiac Enzymes: No results found for this basename: CKTOTAL, CKMB, CKMBINDEX, TROPONINI,  in the last 168 hours  BNP (last 3 results) No results found for this basename: PROBNP,  in the last 8760 hours CBG: No results found for this basename: GLUCAP,  in the last 168 hours  Radiological Exams on Admission: No results found.   Assessment/Plan Principal problem:   Ulcer of left lower leg - Will place on IV antibiotics  Zosyn and vancomycin - Most likely related to poor vascular flow in context of extremity with known DVT. Surgeon to evaluate wound and provide further recommendations discussion with accepting physician. Surgeon: Dr. Jones SkeneScot Dock  Active Problems:   CKD (chronic kidney disease) stage 3, GFR 30-59 ml/min - Obtain serum creatinine levels and continue to monitor    Hypertension - Will continue beta blocker with holding parameters    CVA (cerebral infarction)/Right hemiplegia - Continue statin and blood pressure control    Ischemic left 5th toe likely secondary to emboli/Left leg pain/ DVT - will discontinue xarelto and place on heparin gtt as patient may require surgical debridement of left leg wound    Depression - Stable on Cymbalta   Code Status: full code DVT Prophylaxis: Pt will be on heparin gtt for known dvt Family Communication: discussed with son at bedside Disposition Plan: Pending recommendations from specialist  Time spent:> 60 minutes  [name] [facility] Pager [phone]

## 2014-04-30 NOTE — Progress Notes (Signed)
Patient name: Traci Mclaughlin MRN: [id number] DOB: [birth year] Sex: female     Chief Complaint  Patient presents with  . Ischemia    Ref. by Dr. Harvie Heck  C/O  Left Leg pain and getting worse.  Duration 4-6 mo with level 10 for pain    HISTORY OF PRESENT ILLNESS: This is a 63 year old female who has been seen in the past by Dr. Scot Dock, most recently approximately 2 weeks ago.  She has a very complicated history which began in August of 2015.  She was admitted with a left brain stroke and right-sided weakness.  She also had a non--STMI.  She had a left leg DVT which required anticoagulation.  Last week she developed worsening pain in her left leg and toe.  She has developed an open wound on her left fifth toe.   She has undergone recent cardiac evaluation including a Myoview  Past Medical History  Diagnosis Date  . Acute kidney injury 07/06/2011  . Hypertension   . HOH (hard of hearing)   . CVA (cerebral infarction)   . PAD (peripheral artery disease) 04/10/2014  . Cancer     cervical; was on chemo; has been in remission for 63yrs  . Depression 04/19/2014  . Hx of cardiovascular stress test     Nuclear (9/15):  Inf ischemia, EF 35%; high risk  . Stroke Aug. 10, 2015  . STEMI (ST elevation myocardial infarction) 06/2011    Past Surgical History  Procedure Laterality Date  . Cardiac catheterization  07/03/2011    Dr. Irish Lack  . Tee without cardioversion N/A 03/08/2014    Procedure: TRANSESOPHAGEAL ECHOCARDIOGRAM (TEE);  Surgeon: Fay Records, MD;  Location: [facility] ENDOSCOPY;  Service: Cardiovascular;  Laterality: N/A;  . 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 Marital Status: Single    Spouse Name: N/A    Number of Children: N/A  . Years of Education: N/A   Occupational History  . Not on file.   Social History Main Topics  . Smoking status: Former Smoker -- 1.00 packs/day for 1 years   Types: Cigarettes  . Smokeless tobacco: Former Systems developer    Quit date: 06/16/2011  . Alcohol Use: Yes     Comment: occassional  . Drug Use: No  . Sexual Activity: Not on file   Other Topics Concern  . Not on file   Social History Narrative  . No narrative on file    Family History  Problem Relation Age of Onset  . Intracerebral hemorrhage Daughter     during childbirth  . Stroke Daughter   . Heart attack Neg Hx   . Diabetes Mother   . Alcohol abuse Mother   . Lung cancer Father   . COPD Father     Allergies as of 04/30/2014  . (No Known Allergies)    Current Outpatient Prescriptions on File Prior to Visit  Medication Sig Dispense Refill  . acetaminophen (TYLENOL) 500 MG tablet Take 500 mg by mouth every 6 (six) hours as needed for mild pain.      Marland Kitchen amLODipine (NORVASC) 10 MG tablet Take 1 tablet (10 mg total) by mouth daily.  90 tablet  1  . clindamycin (CLEOCIN) 300 MG capsule Take 1 capsule (300 mg total) by mouth 3 (three) times daily.  9 capsule  0  . docusate sodium 100 MG CAPS Take 100  mg by mouth 2 (two) times daily.  30 capsule  0  . DULoxetine (CYMBALTA) 30 MG capsule Take 1 capsule (30 mg total) by mouth daily.  30 capsule  3  . HYDROcodone-acetaminophen (NORCO) 7.5-325 MG per tablet Take 1 tablet by mouth every 6 (six) hours as needed for moderate pain or severe pain.  30 tablet  0  . metoprolol tartrate (LOPRESSOR) 25 MG tablet Take 1 tablet (25 mg total) by mouth 2 (two) times daily.  60 tablet  11  . ondansetron (ZOFRAN) 4 MG tablet Take 1 tablet (4 mg total) by mouth every 8 (eight) hours as needed for nausea or vomiting.  20 tablet  0  . rivaroxaban (XARELTO) 20 MG TABS tablet Take 1 tablet (20 mg total) by mouth daily with supper.  30 tablet  1  . XARELTO STARTER PACK 15 & 20 MG TBPK Take 15-20 mg by mouth as directed. Take as directed on package: Start with one 15mg  tablet by mouth twice a day with food. On Day 22, switch to one 20mg  tablet once a day with food.   51 each  0  . atorvastatin (LIPITOR) 40 MG tablet Take 40 mg by mouth daily at 6 PM.      . oxyCODONE 10 MG TABS Take 1-1.5 tablets (10-15 mg total) by mouth every 6 (six) hours as needed for severe pain.  90 tablet  0  . oxyCODONE-acetaminophen (PERCOCET) 10-325 MG per tablet Take 1 tablet by mouth every 6 (six) hours as needed for pain.  30 tablet  0   No current facility-administered medications on file prior to visit.     REVIEW OF SYSTEMS: Cardiovascular: No chest pain, chest pressure, palpitations, orthopnea, or dyspnea on exertion. No claudication or rest pain,  No history of DVT or phlebitis. Pulmonary: No productive cough, asthma or wheezing. Neurologic: Right-sided weakness Hematologic: No bleeding problems or clotting disorders. Musculoskeletal: No joint pain or joint swelling. Gastrointestinal: No blood in stool or hematemesis Genitourinary: No dysuria or hematuria. Psychiatric:: No history of major depression. Integumentary: Left fifth toe and left knee wound Constitutional: No fever or chills.  PHYSICAL EXAMINATION:   Vital signs are BP 122/70  Pulse 89  Temp(Src) 98.3 F (36.8 C) (Oral)  Resp 16  Ht 5\' 3"  (1.6 m)  Wt 180 lb (81.647 kg)  BMI 31.89 kg/m2  SpO2 98% General: The patient appears their stated age. HEENT:  No gross abnormalities Pulmonary:  Non labored breathing Abdomen: Soft and non-tender Musculoskeletal: There are no major deformities. Neurologic: No focal weakness or paresthesias are detected, Skin: There are no ulcer or rashes noted. Psychiatric: Open wound to the left medial knee with the surrounding erythema.  There is also an open ulcer on the left fifth toe. Cardiovascular: There is a regular rate and rhythm without significant murmur appreciated.    Assessment: Peripheral vascular disease with ulceration, left foot Plan: The patient according to the family has significantly deteriorated over the course of the past several days.  She is  complaining of worsening pain in her foot which likely is the result of a open ulcer.  She also has an erythematous open wound in her left medial knee, which she was given oral antibiotics for last week.  The patient and family understand the significant dilemma with her current situation given her recent stroke and MI, and need for surgical revascularization which will be very complicated given her medical history in order to improve blood flow to the left  foot and increase the likelihood of limb salvage.  They're also aware that she may require primary amputation.  Because of the patient's recent deterioration of the past several days, I have recommended admission to the hospital for improvement of her medical issues as well as IV antibiotics and reversal of her anticoagulation, with the anticipation of some form of surgery either revascularization or amputation, later this week.  I have personally discussed this with the hospitalist and they have agreed to admit the patient  V. Leia Alf, M.D. [facility] Office: [phone] Pager:  [phone]

## 2014-04-30 NOTE — Progress Notes (Signed)
ANTIBIOTIC/ANTICOAGULATION CONSULT NOTE - INITIAL  Pharmacy Consult for Vancomycin, Zosyn, heparin Indication: cellulitis, acute LLE DVT  No Known Allergies  Patient Measurements: Height: 5\' 3"  (160 cm) Weight: 175 lb 14.8 oz (79.8 kg) IBW/kg (Calculated) : 52.4 Adjusted Body Weight: n/a  Vital Signs: Temp: 98.4 F (36.9 C) (10/05 1613) Temp Source: Oral (10/05 1613) BP: 118/81 mmHg (10/05 1613) Pulse Rate: 100 (10/05 1613) Intake/Output from previous day:   Intake/Output from this shift:    Labs: No results found for this basename: WBC, HGB, PLT, LABCREA, CREATININE,  in the last 72 hours Estimated Creatinine Clearance: 48 ml/min (by C-G formula based on Cr of 1.2). No results found for this basename: VANCOTROUGH, VANCOPEAK, VANCORANDOM, GENTTROUGH, GENTPEAK, GENTRANDOM, TOBRATROUGH, TOBRAPEAK, TOBRARND, AMIKACINPEAK, AMIKACINTROU, AMIKACIN,  in the last 72 hours   Microbiology: No results found for this or any previous visit (from the past 720 hour(s)).  Medical History: Past Medical History  Diagnosis Date  . Acute kidney injury 07/06/2011  . Hypertension   . HOH (hard of hearing)   . CVA (cerebral infarction)   . PAD (peripheral artery disease) 04/10/2014  . Cancer     cervical; was on chemo; has been in remission for 54yrs  . Depression 04/19/2014  . Hx of cardiovascular stress test     Nuclear (9/15):  Inf ischemia, EF 35%; high risk  . Stroke Aug. 10, 2015  . STEMI (ST elevation myocardial infarction) 06/2011    Assessment: 63 yo female with hx CVA and NSTEMI.  Now with worsening pain in left leg/toe c/w open wounds.  Pharmacy asked to begin empiric coverage with vancomycin and Zosyn.  Est CrCl ~ 48 ml/min.  Also with recent acute LLE DVT, started on Xarelto 9/6.  Now holding Xarelto and pharmacy asked to begin anticoagulation with IV heparin as a bridge for anticipated surgical procedures.  Last dose of Xarelto this morning at 11 AM.  Goal of Therapy:   Vancomycin trough 10-15 Heparin level 0.3-0.7 Monitor Platelets per protocol. Yes  Plan:  1. Start Zosyn 3.375g IV q 8 hrs. 2. Vancomycin 1500 mg IV q 24 hrs. 3. Start IV heparin tomorrow AM at 1100 since she should still be "therapeutic" on Xarelto until then. 4. Heparin level and PTT 6 hrs after heparin starts.  Expect heparin levels to be falsely elevated until Xarelto completely washes out. 5. Daily CBC.  Uvaldo Rising, BCPS  Clinical Pharmacist Pager [phone]  04/30/2014 5:54 PM

## 2014-04-30 NOTE — Telephone Encounter (Signed)
Xarelto PA approved through 04/28/2015. Ref #: [id number]. Approval letter sent for scanning. JG//CMA

## 2014-05-01 ENCOUNTER — Ambulatory Visit: Payer: Medicare Other | Admitting: Interventional Cardiology

## 2014-05-01 DIAGNOSIS — N183 Chronic kidney disease, stage 3 (moderate): Secondary | ICD-10-CM

## 2014-05-01 DIAGNOSIS — I129 Hypertensive chronic kidney disease with stage 1 through stage 4 chronic kidney disease, or unspecified chronic kidney disease: Secondary | ICD-10-CM

## 2014-05-01 DIAGNOSIS — L97929 Non-pressure chronic ulcer of unspecified part of left lower leg with unspecified severity: Secondary | ICD-10-CM

## 2014-05-01 LAB — BASIC METABOLIC PANEL
ANION GAP: 19 — AB (ref 5–15)
BUN: 17 mg/dL (ref 6–23)
CHLORIDE: 96 meq/L (ref 96–112)
CO2: 23 mEq/L (ref 19–32)
CREATININE: 1.04 mg/dL (ref 0.50–1.10)
Calcium: 8.9 mg/dL (ref 8.4–10.5)
GFR, EST AFRICAN AMERICAN: 65 mL/min — AB (ref >90)
GFR, EST NON AFRICAN AMERICAN: 56 mL/min — AB (ref >90)
Glucose, Bld: 107 mg/dL — ABNORMAL HIGH (ref 70–99)
Potassium: 3.9 mEq/L (ref 3.7–5.3)
Sodium: 138 mEq/L (ref 137–147)

## 2014-05-01 LAB — GLUCOSE, CAPILLARY: GLUCOSE-CAPILLARY: 103 mg/dL — AB (ref 70–99)

## 2014-05-01 LAB — CBC
HEMATOCRIT: 28.2 % — AB (ref 36.0–46.0)
HEMOGLOBIN: 9.6 g/dL — AB (ref 12.0–15.0)
MCH: 32.5 pg (ref 26.0–34.0)
MCHC: 34 g/dL (ref 30.0–36.0)
MCV: 95.6 fL (ref 78.0–100.0)
Platelets: 375 10*3/uL (ref 150–400)
RBC: 2.95 MIL/uL — ABNORMAL LOW (ref 3.87–5.11)
RDW: 12.7 % (ref 11.5–15.5)
WBC: 14.4 10*3/uL — ABNORMAL HIGH (ref 4.0–10.5)

## 2014-05-01 LAB — APTT: aPTT: 103 seconds — ABNORMAL HIGH (ref 24–37)

## 2014-05-01 LAB — HEPARIN LEVEL (UNFRACTIONATED): Heparin Unfractionated: >2.2 IU/mL — ABNORMAL HIGH (ref 0.30–0.70)

## 2014-05-01 MED ORDER — HEPARIN (PORCINE) IN NACL 100-0.45 UNIT/ML-% IJ SOLN
900.0000 [IU]/h | INTRAMUSCULAR | Status: DC
  Filled 2014-05-01 (×3): qty 250

## 2014-05-01 NOTE — Progress Notes (Signed)
VASCULAR SURGERY ASSESSMENT & PLAN:   This patient is known to me. She was originally seen in consultation on 03/08/2014 with some slight discoloration along the lateral aspect of her left foot. She had a palpable right femoral pulse with no left femoral pulse. She underwent an arteriogram on 03/09/2014 which showed a chronic occlusion of the left common iliac artery. It was found that the only option for revascularization would be a right to left fem-fem bypass graft. However this would be associated with significant risk given that she had just recently had a stroke and a non-ST MI. In addition with her obesity she would be at significant risk for wound infection and wound healing problems.   She was later diagnosed with a left lower extremity DVT and was started on Xarelto. Duplex scan on 04/01/2014 showed nonocclusive acute DVT in the left common femoral and popliteal veins. There was also some acute superficial thrombus in the left greater saphenous vein and distal thigh.  She was seen in the office yesterday by Dr. Trula Slade with worsening pain in her left leg. She also had a new wound along the medial aspect of her left knee. As the patient had deteriorated significantly in the last several days he recommended admission by the Hospitalist. She was admitted yesterday and started on Zosyn and vancomycin.  Based on the extent of her wound adjacent to her left knee, with a new wound on the left fifth toe, I think the safest option is left above-the-knee amputation. I think she would be at high risk for right to left fem-fem bypass grafting with only a small chance that this would be successful in salvaging the left lower extremity. She has had a recent stroke and a recent non-ST MI. It would also be significant risk of wound healing problems given her obesity and also risk of graft infection given her infection in the left leg. I've discussed this with the patient and her daughter and both are agreeable to  proceed with left above-the-knee amputation. We will need to stop her Xarelto. Her surgery is scheduled for Friday.   SUBJECTIVE: Complains of tenderness in the left leg adjacent to infection on medial left knee. Tearful at the thought of requiring an amputation.   PHYSICAL EXAM: Filed Vitals:   04/30/14 1613 04/30/14 2045 05/01/14 0443 05/01/14 1104  BP: 118/81 155/85 160/76 162/90  Pulse: 100 115 116 111  Temp: 98.4 F (36.9 C) 99.2 F (37.3 C) 98.6 F (37 C)   TempSrc: Oral Oral Oral   Resp: 18 18 18    Height: 5\' 3"  (1.6 m)     Weight: 175 lb 14.8 oz (79.8 kg)     SpO2: 97% 97% 96%    There is and infection in the medial left knee which appears to be a hematoma possibly with overlying full-thickness skin necrosis and some cellulitis suggesting infection. She has a small wound on the end of her left fifth toe which was not present before.  LABS: Lab Results  Component Value Date   WBC 14.4* 05/01/2014   HGB 9.6* 05/01/2014   HCT 28.2* 05/01/2014   MCV 95.6 05/01/2014   PLT 375 05/01/2014   Lab Results  Component Value Date   CREATININE 1.04 05/01/2014   Lab Results  Component Value Date   INR 1.59* 04/01/2014   CBG (last 3)   Recent Labs  05/01/14 1115  GLUCAP 103*   Active Problems:   Ischemic left 5th toe likely secondary to emboli  CKD (chronic kidney disease) stage 3, GFR 30-59 ml/min   Hypertension   CVA (cerebral infarction)   Right hemiplegia   Left leg pain   Depression   Ulcer of left lower leg  [name] Beeper: [phone] 05/01/2014

## 2014-05-01 NOTE — Progress Notes (Signed)
[facility] PROGRESS NOTE  Traci Mclaughlin QXI:[id number] DOB: [birth year] DOA: 04/30/2014 PCP: Traci Pai, PA-C Brief narrative: With PMH of left brain Stroke, MI 6 weeks ago, Ileal occlusion might need bypass, Dvt on xarelto, ulcer fifth toe, open wound left knee. Patient was started on oral antibiotics by PCP for the last 3 days but leg got worse as such pcp recommended admission for IV antibiotics and evaluation by vascular surgeon (Dr. Scot Mclaughlin)   Assessment/Plan:  Principal problem:  Ulcer of left lower leg  - Continue on IV antibiotics Zosyn and vancomycin  - Most likely related to poor vascular flow in context of extremity with known DVT. Surgeon to evaluate wound and provide further recommendations discussion with accepting physician. Surgeon: Dr. Scot Mclaughlin   Active Problems:  CKD (chronic kidney disease) stage 3, GFR 30-59 ml/min  - stable  Hypertension  - Will continue beta blocker with holding parameters   CVA (cerebral infarction)/Right hemiplegia  - Continue statin and blood pressure control   Ischemic left 5th toe likely secondary to emboli/Left leg pain/ DVT  - will discontinue xarelto and place on heparin gtt as patient may require surgical debridement of left leg wound   Depression  - Stable on Cymbalta  Code Status: full Family Communication: none at bedside (son updated on plan yesterday) Disposition Plan: Per recommendations from surgeon   Consultants:  Vascular surgery  Procedures:  None  Antibiotics:  Vancomycin and Zosyn  HPI/Subjective: Pt has no new complaints.   Objective: Filed Vitals:   05/01/14 0443  BP: 160/76  Pulse: 116  Temp: 98.6 F (37 C)  Resp: 18    Intake/Output Summary (Last 24 hours) at 05/01/14 1038 Last data filed at 05/01/14 0900  Gross per 24 hour  Intake    360 ml  Output    150 ml  Net    210 ml   Filed Weights   04/30/14 1613  Weight: 79.8 kg (175 lb 14.8 oz)    Exam:   General:  Pt in  nad, alert and awake  Cardiovascular: rrr, no mrg  Respiratory: cta bl, no wheezes  Abdomen: soft, NT, ND  Musculoskeletal: no clubbing  Skin:  LLE wound at left medial thigh with necrotic center and cellulitis  Data Reviewed: Basic Metabolic Panel:  Recent Labs Lab 04/30/14 1821 05/01/14 0415  NA 135* 138  K 3.9 3.9  CL 95* 96  CO2 22 23  GLUCOSE 112* 107*  BUN 17 17  CREATININE 0.94 1.04  CALCIUM 9.5 8.9  MG 1.6  --   PHOS 3.2  --    Liver Function Tests:  Recent Labs Lab 04/30/14 1821  AST 29  ALT 25  ALKPHOS 65  BILITOT 0.4  PROT 8.2  ALBUMIN 2.8*   No results found for this basename: LIPASE, AMYLASE,  in the last 168 hours No results found for this basename: AMMONIA,  in the last 168 hours CBC:  Recent Labs Lab 04/26/14 1141 05/01/14 0415  WBC 15.2* 14.4*  NEUTROABS 12.4*  --   HGB 11.4* 9.6*  HCT 33.9* 28.2*  MCV 96.8 95.6  PLT 472.0* 375   Cardiac Enzymes: No results found for this basename: CKTOTAL, CKMB, CKMBINDEX, TROPONINI,  in the last 168 hours BNP (last 3 results) No results found for this basename: PROBNP,  in the last 8760 hours CBG: No results found for this basename: GLUCAP,  in the last 168 hours  No results found for this or any previous visit (from the past  240 hour(s)).   Studies: No results found.  Scheduled Meds: . atorvastatin  40 mg Oral q1800  . DULoxetine  30 mg Oral Daily  . metoprolol tartrate  25 mg Oral BID  . piperacillin-tazobactam (ZOSYN)  IV  3.375 g Intravenous 3 times per day  . sodium chloride  3 mL Intravenous Q12H  . vancomycin  1,500 mg Intravenous Q24H   Continuous Infusions: . heparin      Time spent: > 35 minutes    Traci Mclaughlin  [facility] Pager [phone] If 7PM-7AM, please contact night-coverage at [email or web address], password [facility] 05/01/2014, 10:38 AM  LOS: 1 day

## 2014-05-01 NOTE — Care Management Note (Signed)
    Page 1 of 1   05/08/2014     3:30:47 PM CARE MANAGEMENT NOTE 05/08/2014  Patient:  [facility]   Account Number:  [id number]  Date Initiated:  05/01/2014  Documentation initiated by:  Barre Aydelott  Subjective/Objective Assessment:   Pt adm on 04/30/14 with DVT, ulcer of Lt lower leg.  PTA, pt resides at home with daughter.     Action/Plan:   Will follow for dc needs as pt progresses.   Anticipated DC Date:  05/08/2014   Anticipated DC Plan:  IP REHAB FACILITY  In-house referral  Clinical Social Worker      DC Planning Services  CM consult      Choice offered to / List presented to:             Status of service:  Completed, signed off Medicare Important Message given?  YES (If response is "NO", the following Medicare IM given date fields will be blank) Date Medicare IM given:  05/03/2014 Medicare IM given by:  Anaid Haney Date Additional Medicare IM given:  05/07/2014 Additional Medicare IM given by:  Arcelia Pals  Discharge Disposition:  IP REHAB FACILITY  Per UR Regulation:  Reviewed for med. necessity/level of care/duration of stay  If discussed at [place] of Stay Meetings, dates discussed:   05/08/2014    Comments:  05/08/14 Ellan Lambert, RN, BSN [phone] Pt evaluated for CIR and approved by insurance.  For dc to CIR today.  05/03/14 Ellan Lambert, RN, BSN [phone] Pt for Lt AKA on 05/04/14.  CSW consulted for possible SNF at dc.  Will follow progress.

## 2014-05-01 NOTE — Progress Notes (Signed)
Hospitalitist paged for order for tele. Sherrie Mustache 8:57 AM

## 2014-05-01 NOTE — Progress Notes (Signed)
[facility]  Patient Status: Active (receiving services up to time of hospitalization)  [facility] is providing the following services: RN and ST  If patient discharges after hours, please call [phone].   Consepcion Hearing 05/01/2014, 12:01 PM

## 2014-05-01 NOTE — Progress Notes (Signed)
ANTICOAGULATION CONSULT NOTE - Follow Up Consult  Pharmacy Consult for heparin Indication: acute LLE DVT   No Known Allergies  Patient Measurements: Height: 5\' 3"  (160 cm) Weight: 175 lb 14.8 oz (79.8 kg) IBW/kg (Calculated) : 52.4 Heparin Dosing Weight:   Vital Signs: Temp: 99.1 F (37.3 C) (10/06 1939) Temp Source: Oral (10/06 1939) BP: 151/81 mmHg (10/06 1939) Pulse Rate: 92 (10/06 1939)  Labs:  Recent Labs  04/30/14 1821 05/01/14 0415 05/01/14 1835  HGB  --  9.6*  --   HCT  --  28.2*  --   PLT  --  375  --   APTT  --   --  103*  HEPARINUNFRC  --  >2.20*  --   CREATININE 0.94 1.04  --     Estimated Creatinine Clearance: 55.4 ml/min (by C-G formula based on Cr of 1.04).   Medications:  Scheduled:  . atorvastatin  40 mg Oral q1800  . DULoxetine  30 mg Oral Daily  . metoprolol tartrate  25 mg Oral BID  . piperacillin-tazobactam (ZOSYN)  IV  3.375 g Intravenous 3 times per day  . sodium chloride  3 mL Intravenous Q12H  . vancomycin  1,500 mg Intravenous Q24H    Assessment: Pt has a new wound on left knee and is probably going to require an above the knee amputation on Friday. Pharmacy continues to dose her antibiotics and heparin. Goal of Therapy:  Xarelto continues to affect HL so we are using aPTT to monitor the heparin. The aPTT was 103, slightly above the goal of 66-102 at 950 units/hr. Decreased the rate to 900 units/hr and will recheck aPTT and HL in the AM. Monitor platelets by anticoagulation protocol: Yes   Plan:  Decrease heparin level to 900 units/hr Check an AM HL, aPTT and CBC  Minta Balsam 05/01/2014,8:46 PM

## 2014-05-02 ENCOUNTER — Encounter (HOSPITAL_COMMUNITY): Payer: Self-pay | Admitting: Cardiology

## 2014-05-02 DIAGNOSIS — I4510 Unspecified right bundle-branch block: Secondary | ICD-10-CM

## 2014-05-02 DIAGNOSIS — I639 Cerebral infarction, unspecified: Secondary | ICD-10-CM

## 2014-05-02 DIAGNOSIS — I420 Dilated cardiomyopathy: Secondary | ICD-10-CM | POA: Diagnosis present

## 2014-05-02 DIAGNOSIS — I2510 Atherosclerotic heart disease of native coronary artery without angina pectoris: Secondary | ICD-10-CM | POA: Diagnosis present

## 2014-05-02 DIAGNOSIS — Z86718 Personal history of other venous thrombosis and embolism: Secondary | ICD-10-CM

## 2014-05-02 DIAGNOSIS — I10 Essential (primary) hypertension: Secondary | ICD-10-CM

## 2014-05-02 DIAGNOSIS — Z01810 Encounter for preprocedural cardiovascular examination: Secondary | ICD-10-CM

## 2014-05-02 HISTORY — DX: Unspecified right bundle-branch block: I45.10

## 2014-05-02 LAB — APTT: APTT: 93 s — AB (ref 24–37)

## 2014-05-02 LAB — CBC
HEMATOCRIT: 29 % — AB (ref 36.0–46.0)
HEMOGLOBIN: 9.7 g/dL — AB (ref 12.0–15.0)
MCH: 31 pg (ref 26.0–34.0)
MCHC: 33.4 g/dL (ref 30.0–36.0)
MCV: 92.7 fL (ref 78.0–100.0)
Platelets: 388 10*3/uL (ref 150–400)
RBC: 3.13 MIL/uL — AB (ref 3.87–5.11)
RDW: 12.5 % (ref 11.5–15.5)
WBC: 12.5 10*3/uL — ABNORMAL HIGH (ref 4.0–10.5)

## 2014-05-02 LAB — HEPARIN LEVEL (UNFRACTIONATED): Heparin Unfractionated: 1.46 IU/mL — ABNORMAL HIGH (ref 0.30–0.70)

## 2014-05-02 MED ORDER — MORPHINE SULFATE 2 MG/ML IJ SOLN
2.0000 mg | INTRAMUSCULAR | Status: DC | PRN
  Administered 2014-05-03 – 2014-05-07 (×6): 2 mg via INTRAVENOUS
  Filled 2014-05-02 (×6): qty 1

## 2014-05-02 MED ORDER — ASPIRIN EC 81 MG PO TBEC
81.0000 mg | DELAYED_RELEASE_TABLET | Freq: Every day | ORAL | Status: DC
  Administered 2014-05-02 – 2014-05-08 (×6): 81 mg via ORAL
  Filled 2014-05-02 (×7): qty 1

## 2014-05-02 MED ORDER — RAMIPRIL 2.5 MG PO CAPS
2.5000 mg | ORAL_CAPSULE | Freq: Every day | ORAL | Status: DC
  Administered 2014-05-02 – 2014-05-05 (×3): 2.5 mg via ORAL
  Filled 2014-05-02 (×5): qty 1

## 2014-05-02 MED ORDER — CARVEDILOL 6.25 MG PO TABS
6.2500 mg | ORAL_TABLET | Freq: Two times a day (BID) | ORAL | Status: DC
  Administered 2014-05-02 – 2014-05-03 (×2): 6.25 mg via ORAL
  Filled 2014-05-02 (×5): qty 1

## 2014-05-02 MED ORDER — ACETAMINOPHEN 500 MG PO TABS: 500.0000 mg | ORAL_TABLET | Freq: Four times a day (QID) | ORAL | Status: DC | PRN

## 2014-05-02 NOTE — Progress Notes (Signed)
   VASCULAR SURGERY ASSESSMENT & PLAN:  * Xarelto on hold. Plan Left AKA Friday. Spoke with patient and daughter yesterday. Daughter is agreeable and patient seems to understand.  *  Continue Vanco and Zosyn.  SUBJECTIVE: Asleep  PHYSICAL EXAM: Filed Vitals:   05/01/14 1104 05/01/14 1324 05/01/14 1939 05/02/14 0429  BP: 162/90 159/90 151/81 156/83  Pulse: 111 100 92 103  Temp:  99.4 F (37.4 C) 99.1 F (37.3 C) 100 F (37.8 C)  TempSrc:  Oral Oral Oral  Resp:  18 18 18   Height:      Weight:      SpO2:  99% 94% 99%   No change in exam.  Extensive wound medial left knee. Looks like warfarin induced skin necrosis, but I believe that she has been on Xeralto and not Coumadin.  LABS: Lab Results  Component Value Date   WBC 12.5* 05/02/2014   HGB 9.7* 05/02/2014   HCT 29.0* 05/02/2014   MCV 92.7 05/02/2014   PLT 388 05/02/2014   Lab Results  Component Value Date   CREATININE 1.04 05/01/2014   Lab Results  Component Value Date   INR 1.59* 04/01/2014   CBG (last 3)   Recent Labs  05/01/14 1115  GLUCAP 103*    Active Problems:   Ischemic left 5th toe likely secondary to emboli   CKD (chronic kidney disease) stage 3, GFR 30-59 ml/min   Hypertension   CVA (cerebral infarction)   Right hemiplegia   Left leg pain   Depression   Ulcer of left lower leg  [name] Beeper: [phone] 05/02/2014

## 2014-05-02 NOTE — Progress Notes (Signed)
Discussed patient with Dr. Ron Parker and Dr. Scot Dock.  Given recent high risk abnormal stress test, plan on cath.  Last cath was without any disease.    Given that she needs anticoagulation, would consider BMS-to avoid DAPT or too long while on Coumadin.    Dr. Scot Dock ok with doing AKA with the patient on Plavix if the plavix is necessary.  Orders placed for cath tomorrow.

## 2014-05-02 NOTE — Progress Notes (Signed)
ANTIBIOTIC/ANTICOAGULATION CONSULT NOTE - INITIAL  Pharmacy Consult for heparin Indication: acute LLE DVT  No Known Allergies  Patient Measurements: Height: 5\' 3"  (160 cm) Weight: 175 lb 14.8 oz (79.8 kg) IBW/kg (Calculated) : 52.4 Adjusted Body Weight: n/a  Vital Signs: Temp: 100 F (37.8 C) (10/07 0429) Temp Source: Oral (10/07 0429) BP: 156/83 mmHg (10/07 0429) Pulse Rate: 103 (10/07 0429) Intake/Output from previous day: 10/06 0701 - 10/07 0700 In: 840 [P.O.:840] Out: 550 [Urine:550] Intake/Output from this shift:    Labs:  Recent Labs  04/30/14 1821 05/01/14 0415 05/02/14 0354  WBC  --  14.4* 12.5*  HGB  --  9.6* 9.7*  PLT  --  375 388  CREATININE 0.94 1.04  --    Estimated Creatinine Clearance: 55.4 ml/min (by C-G formula based on Cr of 1.04). No results found for this basename: VANCOTROUGH, VANCOPEAK, VANCORANDOM, GENTTROUGH, GENTPEAK, GENTRANDOM, TOBRATROUGH, TOBRAPEAK, TOBRARND, AMIKACINPEAK, AMIKACINTROU, AMIKACIN,  in the last 72 hours   Microbiology: No results found for this or any previous visit (from the past 720 hour(s)).  Medical History: Past Medical History  Diagnosis Date  . Acute kidney injury 07/06/2011  . Hypertension   . HOH (hard of hearing)   . CVA (cerebral infarction)   . PAD (peripheral artery disease) 04/10/2014  . Cancer     cervical; was on chemo; has been in remission for 80yrs  . Depression 04/19/2014  . Hx of cardiovascular stress test     Nuclear (9/15):  Inf ischemia, EF 35%; high risk  . Stroke Aug. 10, 2015  . STEMI (ST elevation myocardial infarction) 06/2011    Assessment: 63 yo female with recent acute LLE DVT, on Xarelto PTA. Now holding Xarelto and on IV heparin as a bridge for anticipated AKA Friday. APTT level (93) is therapeutic this morning on 900 units/hr. CBC stable, no bleeding noted  Goal of Therapy:  APTT level 66-102 Heparin level 0.3-0.7 Monitor Platelets per protocol. Yes  Plan:  - Continue  heparin 900 units/hr - f/u Am heparin level, aPTT and CBC - f/u plans for restarting xarelto after surgery  Maryanna Shape, PharmD, BCPS  Clinical Pharmacist  Pager: [phone]   05/02/2014 8:33 AM

## 2014-05-02 NOTE — Progress Notes (Signed)
PROGRESS NOTE    Jacquelinne Speak MWU:[id number] DOB: 01-31-1951 DOA: 04/30/2014 PCP: Elise Benne  HPI/Brief narrative 63 year old female patient s/p cardiac cath 2012 with normal coronaries, left MCA stroke with right hemiparesis August 2015 at which time noted to have NSTEMI and echo with wall motion abnormalities- repeat Cardiac Cath not felt prudent. TEE-no clot. LINQ implantable recorder placed. LLE DVT diagnosed 04/01/14 -started Xarelto. Symptomatic PAD of left leg. OP nuclear stress test 04/23/14-high risk scan with EF 35%. Patient now admitted with worsening pain in her left leg and new wound medial aspect of left knee. Vascular surgery plans left AKA on 10/9. Cardiology consulted for preop clearance.    Assessment/Plan:  1. PAD/ischemic (embolic) left fifth toe/ulcer medial left knee: Vascular surgery consultation and followup appreciated. Plan left AKA on 10/9. Continue IV vancomycin and Zosyn for now-consider DC post operatively. 2. Left lower extremity DVT: Xarelto held and patient currently on Heparin infusion. 3. Presumed CAD/dilated cardiomyopathy/Recent NSTEMI: Cardiology consultation appreciated. Dr. Ron Parker plans to discuss with his colleague and Dr. Scot Dock regarding further course> options including proceeding with surgery being aware of high risk status versus further cardiac risk stratification by cath prior to surgery. Patient is high risk for surgery and has not yet been cleared. Patient being started on aspirin 81 mg, uptitrating beta blockers and ACE inhibitors being started.  4. Recent left MCA stroke with right hemiparesis: etiology unknown.TEE negative for clot. LINQ recorder in place-no A. fib thus far. Right hemiparesis improved/resolved 5. Uncontrolled hypertension: Carvedilol dose increased. Adding ramipril. Monitor. 6. History of depression: Continue Cymbalta. 7. Normocytic anemia: Stable. 8. Stage II-3 CKD: creatinine normal. Monitor. 9. Hard of  hearing:   Code Status: Full Family Communication: Discussed with daughter Ms. Philip Aspen. Disposition Plan: Not medically stable for DC    Consultants:  Vascular Surgery  Cardiology  Procedures:  None  Antibiotics:  IV Vancomycin  IV Zosyn   Subjective:  patient denied specific complaints. Does have pain at left leg ulcer site. Denies chest pain or dyspnea.   Objective: Filed Vitals:   05/01/14 1939 05/02/14 0429 05/02/14 1007 05/02/14 1150  BP: 151/81 156/83 180/92 178/92  Pulse: 92 103 107   Temp: 99.1 F (37.3 C) 100 F (37.8 C)    TempSrc: Oral Oral    Resp: 18 18    Height:      Weight:      SpO2: 94% 99%      Intake/Output Summary (Last 24 hours) at 05/02/14 1330 Last data filed at 05/02/14 0740  Gross per 24 hour  Intake    360 ml  Output    200 ml  Net    160 ml   Filed Weights   04/30/14 1613  Weight: 79.8 kg (175 lb 14.8 oz)     Exam:  General exam:  pleasant middle-aged female lying comfortably supine in bed.  Respiratory system: Clear. No increased work of breathing. Cardiovascular system: S1 & S2 heard, RRR. No JVD, murmurs, gallops, clicks or pedal edema. telemetry: Sinus tachycardia in 100s/BBB morphology with occasional PVCs.  Gastrointestinal system: Abdomen is nondistended, soft and nontender. Normal bowel sounds heard. Central nervous system: Alert and oriented. No focal neurological deficits. Hard of hearing.  Extremities: Symmetric 5 x 5 power. LLE wound at left medial thigh with necrotic center.    Data Reviewed: Basic Metabolic Panel:  Recent Labs Lab 04/30/14 1821 05/01/14 0415  NA 135* 138  K 3.9 3.9  CL 95* 96  CO2  22 23  GLUCOSE 112* 107*  BUN 17 17  CREATININE 0.94 1.04  CALCIUM 9.5 8.9  MG 1.6  --   PHOS 3.2  --    Liver Function Tests:  Recent Labs Lab 04/30/14 1821  AST 29  ALT 25  ALKPHOS 65  BILITOT 0.4  PROT 8.2  ALBUMIN 2.8*   No results found for this basename: LIPASE, AMYLASE,   in the last 168 hours No results found for this basename: AMMONIA,  in the last 168 hours CBC:  Recent Labs Lab 04/26/14 1141 05/01/14 0415 05/02/14 0354  WBC 15.2* 14.4* 12.5*  NEUTROABS 12.4*  --   --   HGB 11.4* 9.6* 9.7*  HCT 33.9* 28.2* 29.0*  MCV 96.8 95.6 92.7  PLT 472.0* 375 388   Cardiac Enzymes: No results found for this basename: CKTOTAL, CKMB, CKMBINDEX, TROPONINI,  in the last 168 hours BNP (last 3 results) No results found for this basename: PROBNP,  in the last 8760 hours CBG:  Recent Labs Lab 05/01/14 1115  GLUCAP 103*    No results found for this or any previous visit (from the past 240 hour(s)).      Studies: No results found.      Scheduled Meds: . aspirin EC  81 mg Oral Daily  . atorvastatin  40 mg Oral q1800  . carvedilol  6.25 mg Oral BID WC  . DULoxetine  30 mg Oral Daily  . piperacillin-tazobactam (ZOSYN)  IV  3.375 g Intravenous 3 times per day  . ramipril  2.5 mg Oral Daily  . sodium chloride  3 mL Intravenous Q12H  . vancomycin  1,500 mg Intravenous Q24H   Continuous Infusions: . heparin 900 Units/hr (05/01/14 2109)    Principal Problem:   Ischemic left 5th toe likely secondary to emboli Active Problems:   CKD (chronic kidney disease) stage 3, GFR 30-59 ml/min   Hypertension   CVA (cerebral infarction)   Right hemiplegia   Left leg pain   Depression   Ulcer of left lower leg   CAD (coronary atherosclerotic disease)   Preop cardiovascular exam   RBBB (right bundle branch block)   Cardiomyopathy, dilated   History of DVT (deep vein thrombosis)    Time spent:  54 minutes     Makar Slatter, MD, FACP, FHM. [facility] Pager [phone]  If 7PM-7AM, please contact night-coverage [email or web address] Password [email or web address] 05/02/2014, 1:30 PM    LOS: 2 days

## 2014-05-02 NOTE — Consult Note (Addendum)
CARDIOLOGY CONSULT NOTE   Patient ID: Traci Mclaughlin MRN: [id number] DOB/AGE: 63-Jan-1952 63 y.o.  Admit Date: 04/30/2014  Primary Physician: [name], PA-C  Primary Cardiologist    Irish Lack Reason for Consultation:  Clinical Summary Traci Mclaughlin is a 63 y.o.female. Cardiology is asked to see the patient in consultation for preop clearance for leg amputation. The overall situation is quite complex. In 2012 the patient underwent cardiac catheterization for elevated enzymes and the arteries were normal. 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 There were elevated troponins. Echo was done showing new significant wall motion abnormalities. It was felt at that time that it was most prudent not to proceed with cardiac catheterization. Additional workup included a transesophageal echo showing no clot. A LINQ implantable recorder was placed to see if the patient turns out to have evidence of atrial fibrillation. After that time the patient was diagnosed with left lower extremity DVT on April 01, 2014. She is anticoagulated with Xarelto. In addition she has severe peripheral arterial disease of the leg. In attempt to further risk stratify the patient for nuclear stress study was done April 23, 2014. Unfortunately this is a high risk scan. Ventricle was dilated. There was a large area of moderate ischemia. The ejection fraction was 35%. The patient has not been complaining of chest pain.  Unfortunately the problems with her legs continue to worsen. She needs to have an amputation on Friday. All options at this time are high risk.   No Known Allergies  Medications Scheduled Medications: . atorvastatin  40 mg Oral q1800  . DULoxetine  30 mg Oral Daily  . metoprolol tartrate  25 mg Oral BID  . piperacillin-tazobactam (ZOSYN)  IV  3.375 g Intravenous 3 times per day  . sodium chloride  3 mL Intravenous  Q12H  . vancomycin  1,500 mg Intravenous Q24H     Infusions: . heparin 900 Units/hr (05/01/14 2109)     PRN Medications:  sodium chloride, acetaminophen, morphine injection, ondansetron, oxyCODONE, oxyCODONE, oxyCODONE-acetaminophen, sodium chloride   Past Medical History  Diagnosis Date  . Acute kidney injury 07/06/2011  . Hypertension   . HOH (hard of hearing)   . CVA (cerebral infarction)   . PAD (peripheral artery disease) 04/10/2014  . Cancer     cervical; was on chemo; has been in remission for 74yrs  . Depression 04/19/2014  . Hx of cardiovascular stress test     Nuclear (9/15):  Inf ischemia, EF 35%; high risk  . Stroke Aug. 10, 2015  . STEMI (ST elevation myocardial infarction) 06/2011    Past Surgical History  Procedure Laterality Date  . Cardiac catheterization  07/03/2011    Dr. Irish Lack  . Tee without cardioversion N/A 03/08/2014    Procedure: TRANSESOPHAGEAL ECHOCARDIOGRAM (TEE);  Surgeon: Fay Records, MD;  Location: [facility] ENDOSCOPY;  Service: Cardiovascular;  Laterality: N/A;  . 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 Intracerebral hemorrhage Daughter     during childbirth  . Stroke Daughter   . Heart attack Neg Hx   . Diabetes Mother   . Alcohol abuse Mother   . Lung cancer Father   . COPD Father     Social History Traci Mclaughlin reports that she has quit smoking. Her smoking use included Cigarettes. She has a  1 pack-year smoking history. She quit smokeless tobacco use about 2 years ago. Traci Mclaughlin reports that she drinks alcohol.  Review of Systems   The patient is very drowsy at this time. She is not having any pain. I am not able to get a complete review of systems from her at this point.  Physical Examination Blood pressure 180/92, pulse 107, temperature 100 F (37.8 C), temperature source Oral, resp. rate 18, height 5\' 3"  (1.6 m), weight  175 lb 14.8 oz (79.8 kg), SpO2 99.00%.  Intake/Output Summary (Last 24 hours) at 05/02/14 1037 Last data filed at 05/02/14 0740  Gross per 24 hour  Intake    840 ml  Output    400 ml  Net    440 ml    Patient is drowsy. Head is atraumatic. She has poor dentition. There is no jugulovenous distention. Lungs reveal scattered rhonchi. There is no respiratory distress. Cardiac exam reveals S1 and S2. The abdomen is soft. The patient is overweight. Her skin lesions on her legs. There is no marked edema.  Prior Cardiac Testing/Procedures  Lab Results  Basic Metabolic Panel:  Recent Labs Lab 04/30/14 1821 05/01/14 0415  NA 135* 138  K 3.9 3.9  CL 95* 96  CO2 22 23  GLUCOSE 112* 107*  BUN 17 17  CREATININE 0.94 1.04  CALCIUM 9.5 8.9  MG 1.6  --   PHOS 3.2  --     Liver Function Tests:  Recent Labs Lab 04/30/14 1821  AST 29  ALT 25  ALKPHOS 65  BILITOT 0.4  PROT 8.2  ALBUMIN 2.8*    CBC:  Recent Labs Lab 04/26/14 1141 05/01/14 0415 05/02/14 0354  WBC 15.2* 14.4* 12.5*  NEUTROABS 12.4*  --   --   HGB 11.4* 9.6* 9.7*  HCT 33.9* 28.2* 29.0*  MCV 96.8 95.6 92.7  PLT 472.0* 375 388    Cardiac Enzymes: No results found for this basename: CKTOTAL, CKMB, CKMBINDEX, TROPONINI,  in the last 168 hours  BNP: No components found with this basename: POCBNP,    Radiology: No results found.   ECG:  I reviewed old EKGs. There is right bundle branch block. However an EKG for the current admission.  Telemetry ;   I have reviewed telemetry today May 02, 2014. There is normal sinus rhythm with mild sinus tachycardia.   Impression and Recommendations     CKD (chronic kidney disease) stage 3, GFR 30-59 ml/min      Careful attention is being paid to her renal function.    Hypertension     Patient has no left or go dysfunction. Her blood pressure is elevated. Her renal function is stable at this time. I will be increasing the dose of her beta blocker and  starting an ACE inhibitor today.    CVA (cerebral infarction)   Right hemiplegia     Patient has had a recent CVA. Etiology remains unknown. TEE revealed no clot in the left atrial appendage. A LINQ implantable recorder is in place to see if atrial fib can be documented. At the time of the most recent check there was no atrial fibrillation.    Ischemic left 5th toe likely secondary to emboli   Left leg pain     There is severe ischemic peripheral disease. The patient needs amputation soon.    Depression   Ulcer of left lower leg    CAD (coronary atherosclerotic disease)      At this time the patient  has presumed coronary artery disease. She has decreased left ventricular function. She has a high risk nuclear scan suggesting significant ischemia. She clearly needs further cardiac evaluation. PT will be to determine when this is safest for her. She had been anticoagulated for her deep venous thrombosis. With that she's not been on aspirin and Plavix. Before starting these today I will discuss this with Dr. Scot Dock.     Preop cardiovascular exam     Unfortunately this is a very complex situation. The patient had a recent CVA. She had a recent non-STEMI. She has wall motion abnormalities that are new from the past. (Cardiac catheterization in 2012 had shown normal coronaries). She has a nuclear scan done very recently showing significant ischemia. This is a high risk nuclear scan. There clearly is no right answer here. I will be talking directly with both Dr. Irish Lack and Dr. Scot Dock. The options include proceeding with amputation understanding that the cardiac risk is significant. The other option is further diagnostic cardiac testing before the amputation. The patient has not yet cleared. There will need to be further careful discussion.    RBBB (right bundle branch block)     Cocaine an EKG for this admission.    Cardiomyopathy, dilated     At this point we still do not know the etiology of the  patient's cardiomyopathy. She needs cardiac catheterization at the safest time possible.    History of DVT    She had recent DVT. She was being treated with Xarelto. This has now been stopped and she is on IV heparin.  For now I will start aspirin 81 mg. I will ask Dr. Scot Dock about the possible use of Plavix. EKG will be ordered. Beta blocker doses to be adjusted in an ACE inhibitor will be started. I will facilitate further discussion between doctors [place] and [place].  As part of today's evaluation I spent greater than 60 minutes with full evaluation and additional time calling physicians about the consult.  Daryel November, MD 05/02/2014, 10:37 AM

## 2014-05-03 ENCOUNTER — Encounter (HOSPITAL_COMMUNITY)
Admission: AD | Disposition: A | Discharge status: Rehabilitation Facility | Payer: Self-pay | Source: Ambulatory Visit | Attending: Internal Medicine

## 2014-05-03 ENCOUNTER — Inpatient Hospital Stay: Payer: Medicare Other | Admitting: Physical Medicine & Rehabilitation

## 2014-05-03 DIAGNOSIS — I2510 Atherosclerotic heart disease of native coronary artery without angina pectoris: Secondary | ICD-10-CM

## 2014-05-03 HISTORY — PX: LEFT HEART CATHETERIZATION WITH CORONARY ANGIOGRAM: SHX5451

## 2014-05-03 LAB — POCT ACTIVATED CLOTTING TIME: ACTIVATED CLOTTING TIME: 107 s

## 2014-05-03 LAB — CBC
HEMATOCRIT: 29.4 % — AB (ref 36.0–46.0)
Hemoglobin: 10 g/dL — ABNORMAL LOW (ref 12.0–15.0)
MCH: 31.5 pg (ref 26.0–34.0)
MCHC: 34 g/dL (ref 30.0–36.0)
MCV: 92.7 fL (ref 78.0–100.0)
Platelets: 403 10*3/uL — ABNORMAL HIGH (ref 150–400)
RBC: 3.17 MIL/uL — AB (ref 3.87–5.11)
RDW: 12.7 % (ref 11.5–15.5)
WBC: 12.7 10*3/uL — ABNORMAL HIGH (ref 4.0–10.5)

## 2014-05-03 LAB — HEPARIN LEVEL (UNFRACTIONATED): Heparin Unfractionated: 0.73 IU/mL — ABNORMAL HIGH (ref 0.30–0.70)

## 2014-05-03 LAB — PROTIME-INR
INR: 1.38 (ref 0.00–1.49)
Prothrombin Time: 17 seconds — ABNORMAL HIGH (ref 11.6–15.2)

## 2014-05-03 LAB — APTT: aPTT: 76 seconds — ABNORMAL HIGH (ref 24–37)

## 2014-05-03 SURGERY — LEFT HEART CATHETERIZATION WITH CORONARY ANGIOGRAM
Anesthesia: LOCAL

## 2014-05-03 MED ORDER — SODIUM CHLORIDE 0.9 % IV SOLN: 1.0000 mL/kg/h | INTRAVENOUS | Status: DC

## 2014-05-03 MED ORDER — HYDRALAZINE HCL 20 MG/ML IJ SOLN
INTRAMUSCULAR | Status: AC
  Filled 2014-05-03: qty 1

## 2014-05-03 MED ORDER — SODIUM CHLORIDE 0.9 % IJ SOLN: 3.0000 mL | Freq: Two times a day (BID) | INTRAMUSCULAR | Status: DC

## 2014-05-03 MED ORDER — ASPIRIN 81 MG PO CHEW: 81.0000 mg | CHEWABLE_TABLET | ORAL | Status: DC

## 2014-05-03 MED ORDER — MIDAZOLAM HCL 2 MG/2ML IJ SOLN
INTRAMUSCULAR | Status: AC
  Filled 2014-05-03: qty 2

## 2014-05-03 MED ORDER — LIDOCAINE HCL (PF) 1 % IJ SOLN
INTRAMUSCULAR | Status: AC
  Filled 2014-05-03: qty 30

## 2014-05-03 MED ORDER — ASPIRIN 81 MG PO CHEW
81.0000 mg | CHEWABLE_TABLET | ORAL | Status: AC
  Administered 2014-05-03: 81 mg via ORAL
  Filled 2014-05-03: qty 1

## 2014-05-03 MED ORDER — ACETAMINOPHEN 325 MG PO TABS: 650.0000 mg | ORAL_TABLET | ORAL | Status: DC | PRN

## 2014-05-03 MED ORDER — ONDANSETRON HCL 4 MG/2ML IJ SOLN
4.0000 mg | Freq: Four times a day (QID) | INTRAMUSCULAR | Status: DC | PRN
  Administered 2014-05-03: 4 mg via INTRAVENOUS
  Filled 2014-05-03: qty 2

## 2014-05-03 MED ORDER — SODIUM CHLORIDE 0.9 % IV SOLN
1.0000 mL/kg/h | INTRAVENOUS | Status: DC
  Administered 2014-05-03: 1 mL/kg/h via INTRAVENOUS

## 2014-05-03 MED ORDER — FENTANYL CITRATE 0.05 MG/ML IJ SOLN
INTRAMUSCULAR | Status: AC
  Filled 2014-05-03: qty 2

## 2014-05-03 MED ORDER — NITROGLYCERIN 1 MG/10 ML FOR IR/CATH LAB
INTRA_ARTERIAL | Status: AC
  Filled 2014-05-03: qty 10

## 2014-05-03 MED ORDER — SODIUM CHLORIDE 0.9 % IV SOLN: 250.0000 mL | INTRAVENOUS | Status: DC | PRN

## 2014-05-03 MED ORDER — CARVEDILOL 12.5 MG PO TABS
12.5000 mg | ORAL_TABLET | Freq: Two times a day (BID) | ORAL | Status: DC
  Administered 2014-05-03 – 2014-05-07 (×7): 12.5 mg via ORAL
  Filled 2014-05-03 (×10): qty 1

## 2014-05-03 MED ORDER — SODIUM CHLORIDE 0.9 % IJ SOLN: 3.0000 mL | INTRAMUSCULAR | Status: DC | PRN

## 2014-05-03 MED ORDER — HEPARIN (PORCINE) IN NACL 2-0.9 UNIT/ML-% IJ SOLN
INTRAMUSCULAR | Status: AC
  Filled 2014-05-03: qty 1500

## 2014-05-03 MED ORDER — SODIUM CHLORIDE 0.9 % IV SOLN
INTRAVENOUS | Status: DC
  Administered 2014-05-03: 18:00:00 via INTRAVENOUS

## 2014-05-03 MED ORDER — HEPARIN (PORCINE) IN NACL 100-0.45 UNIT/ML-% IJ SOLN
900.0000 [IU]/h | INTRAMUSCULAR | Status: DC
  Administered 2014-05-04: 900 [IU]/h via INTRAVENOUS
  Filled 2014-05-03: qty 250

## 2014-05-03 NOTE — Progress Notes (Signed)
    Subjective:  No chest pain. Pt s/p cardiac cath.  Objective:  Vital Signs in the last 24 hours: Temp:  [98.3 F (36.8 C)-99.5 F (37.5 C)] 98.3 F (36.8 C) (10/08 1201) Pulse Rate:  [98-116] 105 (10/08 1300) Resp:  [18] 18 (10/08 1201) BP: (159-170)/(73-94) 159/86 mmHg (10/08 1300) SpO2:  [99 %-100 %] 100 % (10/08 1215) Weight:  [177 lb 14.4 oz (80.695 kg)] 177 lb 14.4 oz (80.695 kg) (10/08 0441)  Intake/Output from previous day: 10/07 0701 - 10/08 0700 In: 279 [P.O.:180; I.V.:99] Out: 700 [Urine:700]  Physical Exam: Pt is alert and oriented, NAD, she is tearful HEENT: normal Neck: JVP - normal Lungs: CTA bilaterally CV: RRR without murmur or gallop Abd: soft, NT, Positive BS, no hepatomegaly Ext: no C/C/E  Lab Results:  Recent Labs  05/02/14 0354 05/03/14 0350  WBC 12.5* 12.7*  HGB 9.7* 10.0*  PLT 388 403*    Recent Labs  04/30/14 1821 05/01/14 0415  NA 135* 138  K 3.9 3.9  CL 95* 96  CO2 22 23  GLUCOSE 112* 107*  BUN 17 17  CREATININE 0.94 1.04   No results found for this basename: TROPONINI, CK, MB,  in the last 72 hours  Cardiac Studies: Cardiac Cath: ANGIOGRAPHY:  Left main: There appeared to be 2 separate ostium supplying the LAD and circumflex vessels.  LAD: Large angiographically normal vessel, which extended to the apex. The vessel gave rise to 2 major diagonal vessels and several septal perforating arteries. There was significant collateralization from the selective LAD injection to the distal RCA via the septal perforating arteries.  Left circumflex: A large vessel that had minimal temperature proximal narrowing before giving rise to a large bifurcating marginal branch. The AV groove circumflex was small caliber. It was significant collateralization from this a selective circumflex injection and supplied the distal RCA  Right coronary artery: Small caliber vessel that was totally occluded in its midsegment. There were faint bridging  collaterals beyond the total occlusion. There also were extensive left to right collaterals being supplied both from the circumflex and the LAD to the distal RCA.  Left ventriculography revealed focal wall motion abnormality involving the apex and distal apical inferior wall with akinesis to dyskinesis suggestive of a small aneurysm. Ejection fraction was approximately 45-50% since the anterior and remaining inferior wall seem to contract well  IMPRESSION:  Single-vessel coronary artery disease with total mid RCA occlusion with faint antegrade bridging collaterals as well as extensive left to right collaterals arising both from the left circumflex coronary artery as well as from the LAD.  Separate ostium to the LAD and left circumflex vessel with a normal LAD and left circumflex vessel with minimal 10% proximal narrowing.  RECOMMENDATION:  Medical therapy.  Assessment/Plan:  63 year-old woman with CAD who needs left AKA for treatment of critical limb ischemia/nonhealing ulcer. Cardiac cath study reviewed and shows chronically occluded RCA with collaterals (single vessel CAD). I communicated with the patient through note writing today. She denies any chest pain/anginal symptoms. OK to proceed with surgery at moderate cardiac risk without further cardiac testing. Continue ASA, atorvastatin, carvedilol.   Sherren Mocha, M.D. 05/03/2014, 1:48 PM

## 2014-05-03 NOTE — Progress Notes (Signed)
  [facility] Progress Note  05/03/2014 10:29 AM  Patient in cardiac cath at this time. She is scheduled for a left above the knee amputation with Dr. Scot Dock tomorrow. NPO after midnight.   Virgina Jock, PA-C [facility] Office: [phone] Pager: [phone] 05/03/2014 10:29 AM

## 2014-05-03 NOTE — H&P (View-Only) (Signed)
Discussed patient with Dr. Ron Parker and Dr. Scot Dock.  Given recent high risk abnormal stress test, plan on cath.  Last cath was without any disease.    Given that she needs anticoagulation, would consider BMS-to avoid DAPT or too long while on Coumadin.    Dr. Scot Dock ok with doing AKA with the patient on Plavix if the plavix is necessary.  Orders placed for cath tomorrow.

## 2014-05-03 NOTE — CV Procedure (Signed)
Traci Mclaughlin is a 63 y.o. female    [id number]  [id number] LOCATION:  FACILITY: [facility]  PHYSICIAN: Troy Sine, MD, [facility] 07-21-1951   DATE OF PROCEDURE:  05/03/2014    CARDIAC CATHETERIZATION     HISTORY:    Traci Mclaughlin is a 63 y.o. female with a left lower extremity DVT and severe peripheral arterial disease of her left leg for which he required amputation.  A preoperative nuclear perfusion study showed a large area of moderate ischemia in the inferior wall with ejection fraction of 35%.  She presents for definitive cardiac catheterization prior to her left leg amputation.  PROCEDURE: Left heart catheterization: Coronary angiography, left ventriculography.  The patient was brought to the [facility] cardiac catherization labaratory in the fasting state. She initially was premedicated with Versed 2 mg and fentanyl 50 mcg.  However, she was still fairly nervous, received an additional milligram of Versed and 25 mcg of fentanyl during the procedure.  Her right groin was prepped and shaved in usual sterile fashion. Xylocaine 1% was used for local anesthesia. A 5 French sheath was inserted into the R femoral artery. Diagnostic catheterizatiion was done with 5 [place] FL4, AL2 and pigtail catheters.  The AL2 catheter was necessary for selective engagement into the LAD, which had a separate ostium and was also used for selective angiography into the RCA.  Left ventriculography was done with 25 cc Omnipaque contrast. Hemostasis was obtained by direct manual compression. The patient tolerated the procedure well.   HEMODYNAMICS:   Central Aorta: 180/91   Left Ventricle: 180/12  ANGIOGRAPHY:  Left main: There appeared to be 2 separate ostium supplying the LAD and circumflex vessels.   LAD: Large angiographically normal vessel, which extended to the apex.  The vessel gave rise to 2 major diagonal vessels and several septal perforating arteries. There was significant  collateralization from the selective LAD injection to the distal RCA via the septal perforating arteries.  Left circumflex: A large vessel that had minimal temperature proximal narrowing before giving rise to a large bifurcating marginal branch.  The AV groove circumflex was small caliber.  It was significant collateralization from this a selective circumflex injection and supplied the distal RCA  Right coronary artery: Small caliber vessel that was totally occluded in its midsegment.  There were faint bridging collaterals beyond the total occlusion.  There also were extensive left to right collaterals being supplied both from the circumflex and the LAD to the distal RCA.   Left ventriculography revealed focal wall motion abnormality involving the apex and distal apical inferior wall with akinesis to dyskinesis suggestive of a small aneurysm.  Ejection fraction was approximately 45-50% since the anterior and remaining inferior wall seem to contract well    IMPRESSION:  Single-vessel coronary artery disease with total mid RCA occlusion with faint antegrade bridging collaterals as well as extensive left to right collaterals arising both from the left circumflex coronary artery as well as from the LAD.  Separate ostium to the LAD and left circumflex vessel with a normal LAD and left circumflex vessel with minimal 10% proximal narrowing.  RECOMMENDATION:  Medical therapy.  Troy Sine, MD, [facility] 05/03/2014 10:47 AM

## 2014-05-03 NOTE — Interval H&P Note (Signed)
Cath Lab Visit (complete for each Cath Lab visit)  Clinical Evaluation Leading to the Procedure:   ACS: No.  Non-ACS:    Anginal Classification: CCS III  Anti-ischemic medical therapy: Maximal Therapy (2 or more classes of medications)  Non-Invasive Test Results: High-risk stress test findings: cardiac mortality >3%/year  Prior CABG: No previous CABG      History and Physical Interval Note:  05/03/2014 9:52 AM  Traci Mclaughlin  has presented today for surgery, with the diagnosis of CP  The various methods of treatment have been discussed with the patient and family. After consideration of risks, benefits and other options for treatment, the patient has consented to  Procedure(s): LEFT HEART CATHETERIZATION WITH CORONARY ANGIOGRAM (N/A) as a surgical intervention .  The patient's history has been reviewed, patient examined, no change in status, stable for surgery.  I have reviewed the patient's chart and labs.  Questions were answered to the patient's satisfaction.     KELLY,THOMAS A

## 2014-05-03 NOTE — Progress Notes (Signed)
   VASCULAR SURGERY ASSESSMENT & PLAN:  * If she is cleared from a cardiac standpoint, I will plan on proceeding with left above-the-knee amputation tomorrow. Her Xarelto has been on hold. We will stop her heparin on call to the OR.  *Clearly the patient is at high risk for any surgery but I feel that this is the safest approach. I discussed the situation with her daughter over the phone.    * continue vancomycin and Zosyn.  SUBJECTIVE: Resting comfortably.  PHYSICAL EXAM: Filed Vitals:   05/03/14 0441 05/03/14 0537 05/03/14 0557 05/03/14 1201  BP: 167/94  166/73 170/87  Pulse: 116 98 105 109  Temp: 99.5 F (37.5 C)   98.3 F (36.8 C)  TempSrc: Oral   Oral  Resp: 18   18  Height:      Weight: 177 lb 14.4 oz (80.695 kg)     SpO2: 99%   100%   No change in exam. Extensive wound medial left knee. Left fourth and fifth toes unchanged.  LABS: Lab Results  Component Value Date   WBC 12.7* 05/03/2014   HGB 10.0* 05/03/2014   HCT 29.4* 05/03/2014   MCV 92.7 05/03/2014   PLT 403* 05/03/2014   Lab Results  Component Value Date   CREATININE 1.04 05/01/2014   Lab Results  Component Value Date   INR 1.38 05/03/2014   CBG (last 3)   Recent Labs  05/01/14 1115  GLUCAP 103*    Principal Problem:   Ischemic left 5th toe likely secondary to emboli Active Problems:   CKD (chronic kidney disease) stage 3, GFR 30-59 ml/min   Hypertension   CVA (cerebral infarction)   Right hemiplegia   Left leg pain   Depression   Ulcer of left lower leg   CAD (coronary atherosclerotic disease)   Preop cardiovascular exam   RBBB (right bundle branch block)   Cardiomyopathy, dilated   History of DVT (deep vein thrombosis)    [name] Beeper: [phone] 05/03/2014

## 2014-05-03 NOTE — Progress Notes (Signed)
PROGRESS NOTE    Traci Mclaughlin WJX:[id number] DOB: 07-09-1951 DOA: 04/30/2014 PCP: Elise Benne  HPI/Brief narrative 63 year old female patient s/p cardiac cath 2012 with normal coronaries, left MCA stroke with right hemiparesis August 2015 at which time noted to have NSTEMI and echo with wall motion abnormalities- repeat Cardiac Cath not felt prudent. TEE-no clot. LINQ implantable recorder placed. LLE DVT diagnosed 04/01/14 -started Xarelto. Symptomatic PAD of left leg. OP nuclear stress test 04/23/14-high risk scan with EF 35%. Patient now admitted with worsening pain in her left leg and new wound medial aspect of left knee. Vascular surgery plans left AKA on 10/9. Cardiology consulted for preop clearance. 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     Assessment/Plan:  1. PAD/ischemic (embolic) left fifth toe/ulcer medial left knee: Vascular surgery consultation and followup appreciated. Plan left AKA on 10/9 pending cardiology clearance. Continue IV vancomycin and Zosyn for now-consider DC post operatively. 2. Left lower extremity DVT: Xarelto held and patient currently on Heparin infusion. 3. CAD/dilated cardiomyopathy/Recent NSTEMI: Cardiology consultation appreciated. Patient was deemed high risk candidate for surgery and underwent cardiac cath 10/8 for risk stratification. Cath showed single vessel CAD with total mid RCA occlusion and medical therapy was recommended. Await cardiologist's final decision regarding preop clearance. She was started on aspirin 81 mg, uptitrating beta blockers and ACE inhibitors.  4. Recent left MCA stroke with right hemiparesis: etiology unknown.TEE negative for clot. LINQ recorder in place-no A. fib thus far. Right hemiparesis improved/resolved 5. Uncontrolled hypertension: Carvedilol dose increased. Added ramipril. Monitor. May need further titration. 6. History of depression: Continue  Cymbalta. 7. Normocytic anemia: Stable. 8. Stage II-3 CKD: creatinine normal. Monitor. 9. Hard of hearing:   Code Status: Full Family Communication: Discussed with daughter Ms. Philip Aspen. Disposition Plan: Not medically stable for DC    Consultants:  Vascular Surgery  Cardiology  Procedures:  Left heart cath 10/8: Single-vessel coronary artery disease with total mid RCA occlusion   Antibiotics:  IV Vancomycin  IV Zosyn   Subjective: "Can you make smell from my left leg wound go away". Patient was seen before cardiac cath.  Objective: Filed Vitals:   05/02/14 1936 05/03/14 0441 05/03/14 0537 05/03/14 0557  BP: 166/84 167/94  166/73  Pulse: 98 116 98 105  Temp: 98.6 F (37 C) 99.5 F (37.5 C)    TempSrc: Oral Oral    Resp: 18 18    Height:      Weight:  80.695 kg (177 lb 14.4 oz)    SpO2: 99% 99%      Intake/Output Summary (Last 24 hours) at 05/03/14 0842 Last data filed at 05/03/14 0444  Gross per 24 hour  Intake    159 ml  Output    700 ml  Net   -541 ml   Filed Weights   04/30/14 1613 05/03/14 0441  Weight: 79.8 kg (175 lb 14.8 oz) 80.695 kg (177 lb 14.4 oz)     Exam:  General exam:  pleasant middle-aged female lying comfortably supine in bed.  Respiratory system: Clear. No increased work of breathing. Cardiovascular system: S1 & S2 heard, RRR. No JVD, murmurs, gallops, clicks or pedal edema. telemetry: SR- ST in 100s/BBB.  Gastrointestinal system: Abdomen is nondistended, soft and nontender. Normal bowel sounds heard. Central nervous system: Alert and oriented. No focal neurological deficits. Hard of hearing.  Extremities: Symmetric 5 x 5 power. LLE wound at left medial thigh with necrotic center and left  tittle toe gangrene.    Data Reviewed: Basic Metabolic Panel:  Recent Labs Lab 04/30/14 1821 05/01/14 0415  NA 135* 138  K 3.9 3.9  CL 95* 96  CO2 22 23  GLUCOSE 112* 107*  BUN 17 17  CREATININE 0.94 1.04  CALCIUM 9.5 8.9   MG 1.6  --   PHOS 3.2  --    Liver Function Tests:  Recent Labs Lab 04/30/14 1821  AST 29  ALT 25  ALKPHOS 65  BILITOT 0.4  PROT 8.2  ALBUMIN 2.8*   No results found for this basename: LIPASE, AMYLASE,  in the last 168 hours No results found for this basename: AMMONIA,  in the last 168 hours CBC:  Recent Labs Lab 04/26/14 1141 05/01/14 0415 05/02/14 0354 05/03/14 0350  WBC 15.2* 14.4* 12.5* 12.7*  NEUTROABS 12.4*  --   --   --   HGB 11.4* 9.6* 9.7* 10.0*  HCT 33.9* 28.2* 29.0* 29.4*  MCV 96.8 95.6 92.7 92.7  PLT 472.0* 375 388 403*   Cardiac Enzymes: No results found for this basename: CKTOTAL, CKMB, CKMBINDEX, TROPONINI,  in the last 168 hours BNP (last 3 results) No results found for this basename: PROBNP,  in the last 8760 hours CBG:  Recent Labs Lab 05/01/14 1115  GLUCAP 103*    No results found for this or any previous visit (from the past 240 hour(s)).      Studies: No results found.      Scheduled Meds: . aspirin EC  81 mg Oral Daily  . atorvastatin  40 mg Oral q1800  . carvedilol  6.25 mg Oral BID WC  . DULoxetine  30 mg Oral Daily  . piperacillin-tazobactam (ZOSYN)  IV  3.375 g Intravenous 3 times per day  . ramipril  2.5 mg Oral Daily  . sodium chloride  3 mL Intravenous Q12H  . sodium chloride  3 mL Intravenous Q12H  . vancomycin  1,500 mg Intravenous Q24H   Continuous Infusions: . sodium chloride 1 mL/kg/hr (05/03/14 0415)  . heparin 900 Units/hr (05/01/14 2109)    Principal Problem:   Ischemic left 5th toe likely secondary to emboli Active Problems:   CKD (chronic kidney disease) stage 3, GFR 30-59 ml/min   Hypertension   CVA (cerebral infarction)   Right hemiplegia   Left leg pain   Depression   Ulcer of left lower leg   CAD (coronary atherosclerotic disease)   Preop cardiovascular exam   RBBB (right bundle branch block)   Cardiomyopathy, dilated   History of DVT (deep vein thrombosis)    Time spent:  25 minutes      Donnice Nielsen, MD, FACP, FHM. [facility] Pager [phone]  If 7PM-7AM, please contact night-coverage [email or web address] Password [email or web address] 05/03/2014, 8:42 AM    LOS: 3 days

## 2014-05-03 NOTE — Progress Notes (Signed)
PT Cancellation Note  Patient Details Name: Traci Mclaughlin MRN: [id number] DOB: [birth year]   Cancelled Treatment:    Reason Eval/Treat Not Completed: Patient at procedure or test/unavailable (Pt at cardiac cath). Likely to have AKA tomorrow.   Shaena Parkerson 05/03/2014, 9:52 AM  Suanne Marker PT [phone]

## 2014-05-03 NOTE — Progress Notes (Signed)
Attempted to see pt. Pt in cardiac cath at this time.  Amputation scheduled for tomorrow.  Will check back as pt is available. Jinger Neighbors, [place] [phone]

## 2014-05-03 NOTE — Progress Notes (Addendum)
ANTIBIOTIC/ANTICOAGULATION CONSULT NOTE - INITIAL  Pharmacy Consult for heparin / vancomycin/zosyn Indication: acute LLE DVT / Ulcer of left lower leg   No Known Allergies  Patient Measurements: Height: 5\' 3"  (160 cm) Weight: 177 lb 14.4 oz (80.695 kg) IBW/kg (Calculated) : 52.4 Adjusted Body Weight: n/a  Vital Signs: Temp: 99.5 F (37.5 C) (10/08 0441) Temp Source: Oral (10/08 0441) BP: 166/73 mmHg (10/08 0557) Pulse Rate: 105 (10/08 0557) Intake/Output from previous day: 10/07 0701 - 10/08 0700 In: 279 [P.O.:180; I.V.:99] Out: 700 [Urine:700] Intake/Output from this shift:    Labs:  Recent Labs  04/30/14 1821 05/01/14 0415 05/02/14 0354 05/03/14 0350  WBC  --  14.4* 12.5* 12.7*  HGB  --  9.6* 9.7* 10.0*  PLT  --  375 388 403*  CREATININE 0.94 1.04  --   --    Estimated Creatinine Clearance: 55.7 ml/min (by C-G formula based on Cr of 1.04). No results found for this basename: VANCOTROUGH, VANCOPEAK, VANCORANDOM, GENTTROUGH, GENTPEAK, GENTRANDOM, TOBRATROUGH, TOBRAPEAK, TOBRARND, AMIKACINPEAK, AMIKACINTROU, AMIKACIN,  in the last 72 hours   Microbiology: No results found for this or any previous visit (from the past 720 hour(s)).  Medical History: Past Medical History  Diagnosis Date  . Acute kidney injury 07/06/2011  . Hypertension   . HOH (hard of hearing)   . CVA (cerebral infarction)   . PAD (peripheral artery disease) 04/10/2014  . Cancer     cervical; was on chemo; has been in remission for 20yrs  . Depression 04/19/2014  . Hx of cardiovascular stress test     Nuclear (9/15):  Inf ischemia, EF 35%; high risk  . Stroke Aug. 10, 2015  . STEMI (ST elevation myocardial infarction) 06/2011  . RBBB (right bundle branch block) 05/02/2014    Assessment: Anticoagulation: Patient has recent acute LLE DVT, on Xarelto PTA. Now holding Xarelto and on IV heparin as a bridge for anticipated AKA Friday. Also plan for cardiac cath today. aPTT level (76) is  therapeutic this morning on 900 units/hr. CBC stable, no bleeding noted.   ID: patient is also on D#4 vancomycin and zosyn for non-healing leg ulcer in the settings of PAD, plan for AKA tomorrow might d/c IV abx after surgery. Tm 99.5, wbc 12.7. No new BMET since 10/6, urine output looks good.   Goal of Therapy:  APTT level 66-102 Heparin level 0.3-0.7 Monitor Platelets per protocol. Yes  Plan:  - Continue heparin 900 units/hr - f/u after cath - f/u Am heparin level, aPTT and CBC - f/u plans for restarting xarelto after surgery - continue vancomycin 1500 mg IV Q 24 hrs - continue zosyn 3.375g IV Q 8 hrs - f/u plan for stopping abx after surgery  Maryanna Shape, PharmD, BCPS  Clinical Pharmacist  Pager: [phone]   05/03/2014 8:33 AM    Addendum: Pt is s/p cath, plan for medical management. EF 45-50%. Will resume IV heparin for recent DVT 8 hrs post sheath removal (1055), and D/C heparin on call to OR tomorrow.  Plan: - restart heparin 900 units/hr at 1900 - f/u 6 hr heparin level and aPTT at 0100 - f/u plans for anticoagulation and antibiotics after surgery tomorrow  Maryanna Shape, PharmD, BCPS  Clinical Pharmacist  Pager: [phone]

## 2014-05-04 ENCOUNTER — Telehealth: Payer: Self-pay | Admitting: Vascular Surgery

## 2014-05-04 ENCOUNTER — Encounter (HOSPITAL_COMMUNITY): Payer: Self-pay | Admitting: Anesthesiology

## 2014-05-04 ENCOUNTER — Inpatient Hospital Stay (HOSPITAL_COMMUNITY): Payer: Medicare Other | Admitting: Anesthesiology

## 2014-05-04 ENCOUNTER — Encounter (HOSPITAL_COMMUNITY)
Admission: AD | Disposition: A | Discharge status: Rehabilitation Facility | Payer: Self-pay | Source: Ambulatory Visit | Attending: Internal Medicine

## 2014-05-04 ENCOUNTER — Encounter (HOSPITAL_COMMUNITY): Payer: Medicare Other | Admitting: Anesthesiology

## 2014-05-04 DIAGNOSIS — I70248 Atherosclerosis of native arteries of left leg with ulceration of other part of lower left leg: Secondary | ICD-10-CM

## 2014-05-04 DIAGNOSIS — I70245 Atherosclerosis of native arteries of left leg with ulceration of other part of foot: Secondary | ICD-10-CM

## 2014-05-04 DIAGNOSIS — E876 Hypokalemia: Secondary | ICD-10-CM

## 2014-05-04 HISTORY — PX: AMPUTATION: SHX166

## 2014-05-04 LAB — CBC
HCT: 26.9 % — ABNORMAL LOW (ref 36.0–46.0)
HEMOGLOBIN: 9 g/dL — AB (ref 12.0–15.0)
MCH: 31.1 pg (ref 26.0–34.0)
MCHC: 33.5 g/dL (ref 30.0–36.0)
MCV: 93.1 fL (ref 78.0–100.0)
PLATELETS: 355 10*3/uL (ref 150–400)
RBC: 2.89 MIL/uL — ABNORMAL LOW (ref 3.87–5.11)
RDW: 12.6 % (ref 11.5–15.5)
WBC: 10.6 10*3/uL — ABNORMAL HIGH (ref 4.0–10.5)

## 2014-05-04 LAB — GLUCOSE, CAPILLARY: GLUCOSE-CAPILLARY: 104 mg/dL — AB (ref 70–99)

## 2014-05-04 LAB — BASIC METABOLIC PANEL
Anion gap: 15 (ref 5–15)
BUN: 9 mg/dL (ref 6–23)
CALCIUM: 8.6 mg/dL (ref 8.4–10.5)
CO2: 24 mEq/L (ref 19–32)
Chloride: 100 mEq/L (ref 96–112)
Creatinine, Ser: 0.86 mg/dL (ref 0.50–1.10)
GFR calc non Af Amer: 70 mL/min — ABNORMAL LOW (ref >90)
GFR, EST AFRICAN AMERICAN: 82 mL/min — AB (ref >90)
GLUCOSE: 92 mg/dL (ref 70–99)
Potassium: 3.1 mEq/L — ABNORMAL LOW (ref 3.7–5.3)
SODIUM: 139 meq/L (ref 137–147)

## 2014-05-04 LAB — HEPARIN LEVEL (UNFRACTIONATED)
HEPARIN UNFRACTIONATED: 0.27 [IU]/mL — AB (ref 0.30–0.70)
HEPARIN UNFRACTIONATED: 0.34 [IU]/mL (ref 0.30–0.70)

## 2014-05-04 LAB — APTT: APTT: 80 s — AB (ref 24–37)

## 2014-05-04 SURGERY — AMPUTATION, ABOVE KNEE
Anesthesia: General | Site: Leg Upper | Laterality: Left

## 2014-05-04 MED ORDER — GUAIFENESIN-DM 100-10 MG/5ML PO SYRP
15.0000 mL | ORAL_SOLUTION | ORAL | Status: DC | PRN
Start: 2014-05-04 — End: 2014-05-08

## 2014-05-04 MED ORDER — FENTANYL CITRATE 0.05 MG/ML IJ SOLN
INTRAMUSCULAR | Status: AC
  Filled 2014-05-04: qty 5

## 2014-05-04 MED ORDER — HEPARIN (PORCINE) IN NACL 100-0.45 UNIT/ML-% IJ SOLN
1000.0000 [IU]/h | INTRAMUSCULAR | Status: AC
  Administered 2014-05-04: 900 [IU]/h via INTRAVENOUS
  Filled 2014-05-04: qty 250

## 2014-05-04 MED ORDER — PROPOFOL 10 MG/ML IV BOLUS
INTRAVENOUS | Status: DC | PRN
  Administered 2014-05-04: 130 mg via INTRAVENOUS

## 2014-05-04 MED ORDER — HYDRALAZINE HCL 20 MG/ML IJ SOLN: 10.0000 mg | INTRAMUSCULAR | Status: DC | PRN

## 2014-05-04 MED ORDER — ALUM & MAG HYDROXIDE-SIMETH 200-200-20 MG/5ML PO SUSP: 15.0000 mL | ORAL | Status: DC | PRN

## 2014-05-04 MED ORDER — MIDAZOLAM HCL 5 MG/5ML IJ SOLN
INTRAMUSCULAR | Status: DC | PRN
  Administered 2014-05-04: 2 mg via INTRAVENOUS

## 2014-05-04 MED ORDER — BACITRACIN ZINC 500 UNIT/GM EX OINT
TOPICAL_OINTMENT | CUTANEOUS | Status: DC | PRN
  Administered 2014-05-04: 1 via TOPICAL

## 2014-05-04 MED ORDER — ACETAMINOPHEN 325 MG PO TABS: 325.0000 mg | ORAL_TABLET | ORAL | Status: DC | PRN

## 2014-05-04 MED ORDER — DIPHENHYDRAMINE HCL 50 MG/ML IJ SOLN
INTRAMUSCULAR | Status: AC
  Filled 2014-05-04: qty 1

## 2014-05-04 MED ORDER — HYDROMORPHONE HCL 1 MG/ML IJ SOLN
INTRAMUSCULAR | Status: AC
  Filled 2014-05-04: qty 1

## 2014-05-04 MED ORDER — 0.9 % SODIUM CHLORIDE (POUR BTL) OPTIME
TOPICAL | Status: DC | PRN
  Administered 2014-05-04: 1000 mL

## 2014-05-04 MED ORDER — DEXAMETHASONE SODIUM PHOSPHATE 4 MG/ML IJ SOLN
INTRAMUSCULAR | Status: DC | PRN
  Administered 2014-05-04: 4 mg via INTRAVENOUS

## 2014-05-04 MED ORDER — LACTATED RINGERS IV SOLN
INTRAVENOUS | Status: DC | PRN
  Administered 2014-05-04: 07:00:00 via INTRAVENOUS

## 2014-05-04 MED ORDER — MIDAZOLAM HCL 2 MG/2ML IJ SOLN
INTRAMUSCULAR | Status: AC
  Filled 2014-05-04: qty 2

## 2014-05-04 MED ORDER — METOPROLOL TARTRATE 1 MG/ML IV SOLN: 2.0000 mg | INTRAVENOUS | Status: DC | PRN

## 2014-05-04 MED ORDER — PHENOL 1.4 % MT LIQD
1.0000 | OROMUCOSAL | Status: DC | PRN
  Filled 2014-05-04: qty 177

## 2014-05-04 MED ORDER — OXYCODONE HCL 5 MG PO TABS: 5.0000 mg | ORAL_TABLET | Freq: Once | ORAL | Status: DC | PRN

## 2014-05-04 MED ORDER — DIPHENHYDRAMINE HCL 50 MG/ML IJ SOLN
INTRAMUSCULAR | Status: DC | PRN
  Administered 2014-05-04: 12.5 mg via INTRAVENOUS

## 2014-05-04 MED ORDER — OXYCODONE HCL 5 MG/5ML PO SOLN: 5.0000 mg | Freq: Once | ORAL | Status: DC | PRN

## 2014-05-04 MED ORDER — BACITRACIN ZINC 500 UNIT/GM EX OINT
TOPICAL_OINTMENT | CUTANEOUS | Status: AC
  Filled 2014-05-04: qty 15

## 2014-05-04 MED ORDER — FENTANYL CITRATE 0.05 MG/ML IJ SOLN
INTRAMUSCULAR | Status: DC | PRN
  Administered 2014-05-04 (×5): 50 ug via INTRAVENOUS

## 2014-05-04 MED ORDER — PROMETHAZINE HCL 25 MG/ML IJ SOLN: 6.2500 mg | INTRAMUSCULAR | Status: DC | PRN

## 2014-05-04 MED ORDER — LABETALOL HCL 5 MG/ML IV SOLN
10.0000 mg | INTRAVENOUS | Status: DC | PRN
  Filled 2014-05-04: qty 4

## 2014-05-04 MED ORDER — PANTOPRAZOLE SODIUM 40 MG PO TBEC
40.0000 mg | DELAYED_RELEASE_TABLET | Freq: Every day | ORAL | Status: DC
  Administered 2014-05-04 – 2014-05-08 (×5): 40 mg via ORAL
  Filled 2014-05-04 (×4): qty 1

## 2014-05-04 MED ORDER — HYDROMORPHONE HCL 1 MG/ML IJ SOLN
0.2500 mg | INTRAMUSCULAR | Status: DC | PRN
  Administered 2014-05-04 (×4): 0.5 mg via INTRAVENOUS

## 2014-05-04 MED ORDER — ONDANSETRON HCL 4 MG/2ML IJ SOLN
4.0000 mg | Freq: Four times a day (QID) | INTRAMUSCULAR | Status: DC | PRN
Start: 2014-05-04 — End: 2014-05-08
  Administered 2014-05-04: 4 mg via INTRAVENOUS
  Filled 2014-05-04: qty 2

## 2014-05-04 MED ORDER — ACETAMINOPHEN 650 MG RE SUPP: 325.0000 mg | RECTAL | Status: DC | PRN

## 2014-05-04 MED ORDER — HEPARIN BOLUS VIA INFUSION
1000.0000 [IU] | Freq: Once | INTRAVENOUS | Status: AC
  Administered 2014-05-04: 1000 [IU] via INTRAVENOUS
  Filled 2014-05-04: qty 1000

## 2014-05-04 MED ORDER — DOCUSATE SODIUM 100 MG PO CAPS
100.0000 mg | ORAL_CAPSULE | Freq: Every day | ORAL | Status: DC
  Administered 2014-05-06 – 2014-05-08 (×3): 100 mg via ORAL
  Filled 2014-05-04 (×4): qty 1

## 2014-05-04 MED ORDER — PROPOFOL 10 MG/ML IV BOLUS
INTRAVENOUS | Status: AC
  Filled 2014-05-04: qty 20

## 2014-05-04 MED ORDER — POTASSIUM CHLORIDE CRYS ER 20 MEQ PO TBCR
40.0000 meq | EXTENDED_RELEASE_TABLET | Freq: Once | ORAL | Status: AC
  Administered 2014-05-04: 40 meq via ORAL
  Filled 2014-05-04: qty 2

## 2014-05-04 MED ORDER — ALBUMIN HUMAN 5 % IV SOLN
INTRAVENOUS | Status: DC | PRN
  Administered 2014-05-04: 08:00:00 via INTRAVENOUS

## 2014-05-04 MED ORDER — LIDOCAINE HCL (CARDIAC) 20 MG/ML IV SOLN
INTRAVENOUS | Status: DC | PRN
  Administered 2014-05-04: 60 mg via INTRAVENOUS

## 2014-05-04 MED ORDER — DEXAMETHASONE SODIUM PHOSPHATE 4 MG/ML IJ SOLN
INTRAMUSCULAR | Status: AC
  Filled 2014-05-04: qty 1

## 2014-05-04 MED ORDER — POTASSIUM CHLORIDE CRYS ER 20 MEQ PO TBCR: 20.0000 meq | EXTENDED_RELEASE_TABLET | Freq: Every day | ORAL | Status: DC | PRN

## 2014-05-04 SURGICAL SUPPLY — 55 items
BANDAGE ELASTIC 4 VELCRO ST LF (GAUZE/BANDAGES/DRESSINGS) ×3 IMPLANT
BANDAGE ELASTIC 6 VELCRO ST LF (GAUZE/BANDAGES/DRESSINGS) ×3 IMPLANT
BANDAGE ESMARK 6X9 LF (GAUZE/BANDAGES/DRESSINGS) ×1 IMPLANT
BLADE SAW RECIP 87.9 MT (BLADE) ×3 IMPLANT
BLADE SURG 10 STRL SS (BLADE) ×3 IMPLANT
BNDG COHESIVE 6X5 TAN STRL LF (GAUZE/BANDAGES/DRESSINGS) ×3 IMPLANT
BNDG ESMARK 6X9 LF (GAUZE/BANDAGES/DRESSINGS) ×3
BNDG GAUZE ELAST 4 BULKY (GAUZE/BANDAGES/DRESSINGS) ×6 IMPLANT
CANISTER SUCTION 2500CC (MISCELLANEOUS) ×3 IMPLANT
CLIP TI MEDIUM 6 (CLIP) IMPLANT
COVER SURGICAL LIGHT HANDLE (MISCELLANEOUS) ×3 IMPLANT
CUFF TOURNIQUET SINGLE 18IN (TOURNIQUET CUFF) IMPLANT
CUFF TOURNIQUET SINGLE 24IN (TOURNIQUET CUFF) IMPLANT
CUFF TOURNIQUET SINGLE 34IN LL (TOURNIQUET CUFF) IMPLANT
CUFF TOURNIQUET SINGLE 44IN (TOURNIQUET CUFF) ×3 IMPLANT
DRAIN CHANNEL 19F RND (DRAIN) IMPLANT
DRAPE ORTHO SPLIT 77X108 STRL (DRAPES) ×4
DRAPE PROXIMA HALF (DRAPES) ×3 IMPLANT
DRAPE SURG ORHT 6 SPLT 77X108 (DRAPES) ×2 IMPLANT
DRAPE U-SHAPE 47X51 STRL (DRAPES) IMPLANT
DRSG ADAPTIC 3X8 NADH LF (GAUZE/BANDAGES/DRESSINGS) ×3 IMPLANT
ELECT REM PT RETURN 9FT ADLT (ELECTROSURGICAL) ×3
ELECTRODE REM PT RTRN 9FT ADLT (ELECTROSURGICAL) ×1 IMPLANT
EVACUATOR SILICONE 100CC (DRAIN) IMPLANT
GAUZE SPONGE 4X4 12PLY STRL (GAUZE/BANDAGES/DRESSINGS) ×3 IMPLANT
GLOVE BIO SURGEON STRL SZ7.5 (GLOVE) ×3 IMPLANT
GLOVE BIOGEL PI IND STRL 6.5 (GLOVE) ×1 IMPLANT
GLOVE BIOGEL PI IND STRL 8 (GLOVE) ×1 IMPLANT
GLOVE BIOGEL PI INDICATOR 6.5 (GLOVE) ×2
GLOVE BIOGEL PI INDICATOR 8 (GLOVE) ×2
GLOVE ECLIPSE 7.5 STRL STRAW (GLOVE) ×3 IMPLANT
GOWN STRL REUS W/ TWL LRG LVL3 (GOWN DISPOSABLE) ×3 IMPLANT
GOWN STRL REUS W/TWL LRG LVL3 (GOWN DISPOSABLE) ×6
KIT BASIN OR (CUSTOM PROCEDURE TRAY) ×3 IMPLANT
KIT ROOM TURNOVER OR (KITS) ×3 IMPLANT
NS IRRIG 1000ML POUR BTL (IV SOLUTION) ×3 IMPLANT
PACK GENERAL/GYN (CUSTOM PROCEDURE TRAY) ×3 IMPLANT
PAD ARMBOARD 7.5X6 YLW CONV (MISCELLANEOUS) ×6 IMPLANT
PADDING CAST COTTON 6X4 STRL (CAST SUPPLIES) IMPLANT
SPONGE GAUZE 4X4 12PLY STER LF (GAUZE/BANDAGES/DRESSINGS) ×3 IMPLANT
STAPLER VISISTAT (STAPLE) ×6 IMPLANT
STOCKINETTE IMPERVIOUS LG (DRAPES) ×3 IMPLANT
SUT ETHILON 3 0 PS 1 (SUTURE) IMPLANT
SUT SILK 0 TIES 10X30 (SUTURE) ×3 IMPLANT
SUT SILK 2 0 (SUTURE) ×2
SUT SILK 2 0 SH CR/8 (SUTURE) ×3 IMPLANT
SUT SILK 2-0 18XBRD TIE 12 (SUTURE) ×1 IMPLANT
SUT SILK 3 0 (SUTURE) ×2
SUT SILK 3-0 18XBRD TIE 12 (SUTURE) ×1 IMPLANT
SUT VIC AB 2-0 CT1 18 (SUTURE) ×6 IMPLANT
SUT VIC AB 2-0 CTX 36 (SUTURE) ×3 IMPLANT
TOWEL OR 17X24 6PK STRL BLUE (TOWEL DISPOSABLE) ×3 IMPLANT
TOWEL OR 17X26 10 PK STRL BLUE (TOWEL DISPOSABLE) ×6 IMPLANT
UNDERPAD 30X30 INCONTINENT (UNDERPADS AND DIAPERS) ×3 IMPLANT
WATER STERILE IRR 1000ML POUR (IV SOLUTION) ×3 IMPLANT

## 2014-05-04 NOTE — Progress Notes (Signed)
ANTICOAGULATION CONSULT NOTE - Follow Up Consult  Pharmacy Consult for Heparin Indication: DVT/PE  No Known Allergies  Patient Measurements: Height: 5\' 3"  (160 cm) Weight: 177 lb 14.4 oz (80.695 kg) IBW/kg (Calculated) : 52.4 Heparin Dosing Weight:  70 kg  Vital Signs: Temp: 98.2 F (36.8 C) (10/09 1009) Temp Source: Oral (10/09 0522) BP: 174/86 mmHg (10/09 1005) Pulse Rate: 102 (10/09 1005)  Labs:  Recent Labs  05/02/14 0354 05/03/14 0350 05/04/14 0010  HGB 9.7* 10.0* 9.0*  HCT 29.0* 29.4* 26.9*  PLT 388 403* 355  APTT 93* 76* 80*  LABPROT  --  17.0*  --   INR  --  1.38  --   HEPARINUNFRC 1.46* 0.73* 0.34  CREATININE  --   --  0.86    Estimated Creatinine Clearance: 67.3 ml/min (by C-G formula based on Cr of 0.86).   Assessment: 63 yo female with hx CVA and NSTEMI. Now with worsening pain in left leg/toe c/w open wounds. 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 Started on Xarelto 9/6 (last dose 10/5). Changed to heparin for bridge to surgery. S/p cath 10/8. AKA 10/9. Last heparin level 0.34 in goal. Reordered post-op.  Infectious Disease: Wound near L knee, L 5th toe wound, poor vascular flow, L AKA 10/9. Afebrile. WBC 10.6 down. No cultures.  Vancomycin 10/5 >> Zosyn 10/5>>  Cardiovascular: HTN, PAD, CAD, HF, cardiology consulted to evaluate risk, cath 10/8 > medical management. EF 45-50%; BP elevated 174/86, HR 95-109, Meds: lipitor40, added asa81, ramipril, coreg  Neurology: CVA, depression, cymbalta. R hemiplegia  Nephrology: Scr 0.86  Hematology / Oncology: hx of cervical cancer - in remission; Hgb 9.  PTA Medication Issues: amlodipine, xarelto  Best Practices: IV heparin  Goal of Therapy:  Heparin level 0.3-0.7 units/ml Monitor platelets by anticoagulation protocol: Yes   Plan:  Resume heparin 1500 at 900 units/hr post-op  L AKA Abx continued post-op for now. Level tomorrow if continued.   Kayzlee Wirtanen S. Alford Highland, PharmD, BCPS Clinical Staff Pharmacist Pager [phone]  Wayland Salinas 05/04/2014,10:53 AM

## 2014-05-04 NOTE — Consult Note (Signed)
Physical Medicine and Rehabilitation Consult  Reason for Consult: L-AKA Referring Physician: Dr. Scot Dock   HPI: Traci Mclaughlin is a 63 y.o. 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 She developed left toe ulcers as well as left knee wound with infection and worsening of pain.  She was admitted on 04/30/14 for pain control as well as IV antibiotics for treatment of infection. 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 She was cleared for surgery by Dr. Burt Knack and underwent L-AKA by Dr. Scot Dock on 05/04/14.  Patient well known to CIR from prior stay. She was limited by high levels of anxiety, lability, communicative deficits (does not use sign language/ minimal ability to read lips) as well as pain. PT/OT evaluations  done and CIR recommended for follow up therapy.    Review of Systems  Unable to perform ROS: language    Past Medical History  Diagnosis Date  . Acute kidney injury 07/06/2011  . Hypertension   . HOH (hard of hearing)   . CVA (cerebral infarction)   . PAD (peripheral artery disease) 04/10/2014  . Cancer     cervical; was on chemo; has been in remission for 54yrs  . Depression 04/19/2014  . Hx of cardiovascular stress test     Nuclear (9/15):  Inf ischemia, EF 35%; high risk  . Stroke Aug. 10, 2015  . STEMI (ST elevation myocardial infarction) 06/2011  . RBBB (right bundle branch block) 05/02/2014   Past Surgical History  Procedure Laterality Date  . Cardiac catheterization  07/03/2011    Dr. Irish Lack  . Tee without cardioversion N/A 03/08/2014    Procedure: TRANSESOPHAGEAL ECHOCARDIOGRAM (TEE);  Surgeon: Fay Records, MD;  Location: [facility] END[facility]COPY;  Service: Cardiovascular;  Laterality: N/A;  . Loop recorder implant  03-12-2014    MDT LINQ implanted by Dr  Rayann Heman for cryptogenic stroke    Family History  Problem Relation Age of Onset  . Intracerebral hemorrhage Daughter     during childbirth  . Stroke Daughter   . Heart attack Neg Hx   . Diabetes Mother   . Alcohol abuse Mother   . Lung cancer Father   . COPD Father     Social History:  Lives with family. Per reports that she has quit smoking. Her smoking use included Cigarettes. She has a 1 pack-year smoking history. She quit smokeless tobacco use about 2 years ago. Per reports that she drinks alcohol. She reports that she does not use illicit drugs.  Allergies: No Known Allergies  Medications Prior to Admission  Medication Sig Dispense Refill  . acetaminophen (TYLENOL) 500 MG tablet Take 500 mg by mouth every 6 (six) hours as needed for mild pain.      Marland Kitchen amLODipine (NORVASC) 10 MG tablet Take 1 tablet (10 mg total) by mouth daily.  90 tablet  1  . atorvastatin (LIPITOR) 40 MG tablet Take 40 mg by mouth daily at 6 PM.      . clindamycin (CLEOCIN) 300 MG capsule Take 1 capsule (300 mg total) by mouth 3 (three) times daily.  9 capsule  0  . docusate sodium 100 MG CAPS Take 100 mg by mouth 2 (two) times daily.  30 capsule  0  . DULoxetine (CYMBALTA) 30 MG capsule Take 1 capsule (30 mg total) by mouth  daily.  30 capsule  3  . HYDROcodone-acetaminophen (NORCO) 7.5-325 MG per tablet Take 1 tablet by mouth every 6 (six) hours as needed for moderate pain or severe pain.  30 tablet  0  . metoprolol tartrate (LOPRESSOR) 25 MG tablet Take 1 tablet (25 mg total) by mouth 2 (two) times daily.  60 tablet  11  . oxyCODONE-acetaminophen (PERCOCET) 10-325 MG per tablet Take 1 tablet by mouth every 6 (six) hours as needed for pain.  30 tablet  0  . rivaroxaban (XARELTO) 20 MG TABS tablet Take 1 tablet (20 mg total) by mouth daily with supper.  30 tablet  1    Home: Home Living Family/patient expects to be discharged to:: Private residence Living Arrangements: Children Available Help at Discharge:  Family;Friend(s) Type of Home: House Home Access: Stairs to enter [employer] of Steps: 3-4 Entrance Stairs-Rails: None Home Layout: Two level;Able to live on main level with bedroom/bathroom Home Equipment: Walker - 2 wheels Additional Comments: no family present to confirm above, information gathered from previous admission; patient very Ricardo thus difficult to communicate  Functional History: Prior Function Comments: unable to detemine; patient states she was independent Functional Status:  Mobility: Bed Mobility Overal bed mobility: Needs Assistance Bed Mobility: Sit to Supine Supine to sit: Min assist Sit to supine: Min assist General bed mobility comments: Min (A) to assist LEs onto bed. Pt able to push throug Bil UEs and RLE to scoot herself up in bed Transfers Overall transfer level: Needs assistance Equipment used: Rolling walker (2 wheeled) Transfers: Sit to/from [employer] Sit to Stand: Min assist;+2 safety/equipment Stand pivot transfers: Min assist;+2 safety/equipment General transfer comment: Min (A) to power up and to stabilize RW. +2 for safety. Pt unable to clear RLE off floor and instead pushes through RW and "walks" her foot across the floor      ADL: ADL Overall ADL's : Needs assistance/impaired Eating/Feeding: Modified independent;Sitting Grooming: Modified independent;Sitting Upper Body Bathing: Minimal assitance;Sitting Lower Body Bathing: Sit to/from stand;+2 for safety/equipment;Maximal assistance Upper Body Dressing : Minimal assistance;Sitting Lower Body Dressing: Total assistance;+2 for safety/equipment;Sit to/from stand Toilet Transfer: Minimal assistance;+2 for safety/equipment;Stand-pivot;RW (recliner > bed) General ADL Comments: Pt limited by pain and residual cognitive deficits. Pt demonstrates slow processing and difficulty problem solving, however is able to manage tasks with few VC's.    Cognition: Cognition Overall Cognitive Status: No family/caregiver present to determine baseline cognitive functioning Orientation Level: Oriented to person;Oriented to place;Oriented to time;Oriented to situation Cognition Arousal/Alertness: Awake/alert Behavior During Therapy: WFL for tasks assessed/performed Overall Cognitive Status: No family/caregiver present to determine baseline cognitive functioning Area of Impairment: Following commands;Safety/judgement;Problem solving Following Commands: Follows one step commands with increased time Safety/Judgement: Decreased awareness of safety Problem Solving: Slow processing;Requires verbal cues;Requires tactile cues General Comments: Pt responding appropriately, however difficult to communicate due to [facility]. Pt slow to respond and demonstrates some difficulty with following commands (although this is likely related to hearing impairment).   Difficult to assess due to: Hard of hearing/deaf  Blood pressure 160/76, pulse 88, temperature 98.2 F (36.8 C), temperature source Oral, resp. rate 20, height 5\' 3"  (1.6 m), weight 78.4 kg (172 lb 13.5 oz), SpO2 100.00%. Physical Exam  Nursing note and vitals reviewed. Constitutional: She appears well-developed and well-nourished.  Patient very familiar to me from prior rehab stay. Did not recognize me or remember CIR. Attempted to communicate by writing but verbal output limited to "I don't know" and "I can't hear".  HENT:  Head: Normocephalic and atraumatic.  Eyes: Conjunctivae are normal. Pupils are equal, round, and reactive to light.  Neck: Normal range of motion. Neck supple.  Cardiovascular: Normal rate and regular rhythm.   Respiratory: Effort normal and breath sounds normal.  GI: Soft. Bowel sounds are normal.  Musculoskeletal:  No edema BUE/RLE. LLE with substantial edema still. Limb tender.  Neurological: She is alert.  Calmer and appropriate. Was able to follow simple commands with  gestures. Hearing poor. Able read written information without issue. UE's 4/5. RLE: 4/5 prox to distal. LLE: HF 2+  Skin: Skin is warm and dry.  L-AKA with dry dressing.   Psychiatric: She has a normal mood and affect. Her behavior is normal.    Results for orders placed during the hospital encounter of 04/30/14 (from the past 24 hour(s))  BASIC METABOLIC PANEL     Status: Abnormal   Collection Time    05/07/14  6:21 AM      Result Value Ref Range   Sodium 137  137 - 147 mEq/L   Potassium 3.8  3.7 - 5.3 mEq/L   Chloride 100  96 - 112 mEq/L   CO2 23  19 - 32 mEq/L   Glucose, Bld 111 (*) 70 - 99 mg/dL   BUN 7  6 - 23 mg/dL   Creatinine, Ser 0.90  0.50 - 1.10 mg/dL   Calcium 8.9  8.4 - 10.5 mg/dL   GFR calc non Af Amer 67 (*) >90 mL/min   GFR calc Af Amer 77 (*) >90 mL/min   Anion gap 14  5 - 15  MAGNESIUM     Status: None   Collection Time    05/07/14  6:21 AM      Result Value Ref Range   Magnesium 1.6  1.5 - 2.5 mg/dL  HEPARIN LEVEL (UNFRACTIONATED)     Status: None   Collection Time    05/07/14  6:24 AM      Result Value Ref Range   Heparin Unfractionated 0.50  0.30 - 0.70 IU/mL   No results found.  Assessment/Plan: Diagnosis: s/p left AKA 1. Does the need for close, 24 hr/day medical supervision in concert with the patient's rehab needs make it unreasonable for this patient to be served in a less intensive setting? Yes 2. Co-Morbidities requiring supervision/potential complications: ckd, htn, depression cm,  3. Due to bladder management, bowel management, safety, skin/wound care, disease management, medication administration, pain management and patient education, does the patient require 24 hr/day rehab nursing? Yes 4. Does the patient require coordinated care of a physician, rehab nurse, PT (1-2 hrs/day, 5 days/week) and OT (1-2 hrs/day, 5 days/week) to address physical and functional deficits in the context of the above medical diagnosis(es)? Yes Addressing deficits in  the following areas: balance, endurance, locomotion, strength, transferring, bowel/bladder control, bathing, dressing, feeding, grooming, toileting and psychosocial support 5. Can the patient actively participate in an intensive therapy program of at least 3 hrs of therapy per day at least 5 days per week? Yes 6. The potential for patient to make measurable gains while on inpatient rehab is excellent 7. Anticipated functional outcomes upon discharge from inpatient rehab are modified independent and supervision  with PT, modified independent and supervision with OT, n/a with SLP. 8. Estimated rehab length of stay to reach the above functional goals is: 7-10 days 9. Does the patient have adequate social supports to accommodate these discharge functional goals? Yes and Potentially 10. Anticipated D/C setting: Home 11. Anticipated  post D/C treatments: HH therapy and Outpatient therapy 12. Overall Rehab/Functional Prognosis: excellent  RECOMMENDATIONS: This patient's condition is appropriate for continued rehabilitative care in the following setting: CIR Patient has agreed to participate in recommended program. Yes Note that insurance prior authorization may be required for reimbursement for recommended care.  Comment: Rehab Admissions Coordinator to follow up.   Thanks,  Meredith Staggers, MD, Mellody Drown     05/07/2014

## 2014-05-04 NOTE — Op Note (Signed)
    NAME: Traci Mclaughlin  MRN: [id number] DOB: [birth year]    DATE OF OPERATION: 05/04/2014  PREOP DIAGNOSIS: Ischemic left lower extremity with nonhealing wounds  POSTOP DIAGNOSIS: same  PROCEDURE: left above-the-knee amputation  SURGEON: Judeth Cornfield. Scot Dock, MD, FACS  ASSIST: Leontine Locket, PA  ANESTHESIA: Gen.   EBL: minimal  INDICATIONS: Traci Mclaughlin is a 63 y.o. female who developed wounds on her left fourth and fifth toes and also an extensive infection adjacent to her left knee. She had a left iliac artery occlusion and was not a candidate for revascularization. She presents for left above-the-knee amputation.  FINDINGS: The muscle appeared well perfused. There were no gross signs of infection at the level of the amputation.  TECHNIQUE: The patient was taken to the operating room and received a general anesthetic. The left lower extremity was prepped and draped in usual sterile fashion. I marked a fishmouth incision above the level of the patella. A tourniquet was placed on the upper thigh. The leg was exsanguinated with an Esmarch bandage and the tourniquet inflated to 300 mm mercury. Under tourniquet control, the incision was carried down to the skin, subcutaneous tissue, fascia, and muscle to the femur which was dissected free circumferentially. The periosteum was elevated and the bone divided proximal to the level of skin division. It leg was then removed. The femoral artery and vein were suture ligated with 2-0 silk ties. The tourniquet was then released. Additional hemostasis was obtained using electrocautery and 2-0 silk ties. The edges of the bone were rasped. The wound was then irrigated with copious amounts of saline. The fascial layer was closed with interrupted 2-0 Vicryl. The subcutaneous layer was closed with a running 2-0 Vicryl. The skin was closed with staples. Sterile dressing was applied. The patient tolerated the procedure well and was transferred to the  recovery room in stable condition. All needle and sponge counts were correct.  Deitra Mayo, MD, FACS [facility]  DATE OF DICTATION:   05/04/2014

## 2014-05-04 NOTE — H&P (View-Only) (Signed)
   VASCULAR SURGERY ASSESSMENT & PLAN:  * If she is cleared from a cardiac standpoint, I will plan on proceeding with left above-the-knee amputation tomorrow. Her Xarelto has been on hold. We will stop her heparin on call to the OR.  *Clearly the patient is at high risk for any surgery but I feel that this is the safest approach. I discussed the situation with her daughter over the phone.    * continue vancomycin and Zosyn.  SUBJECTIVE: Resting comfortably.  PHYSICAL EXAM: Filed Vitals:   05/03/14 0441 05/03/14 0537 05/03/14 0557 05/03/14 1201  BP: 167/94  166/73 170/87  Pulse: 116 98 105 109  Temp: 99.5 F (37.5 C)   98.3 F (36.8 C)  TempSrc: Oral   Oral  Resp: 18   18  Height:      Weight: 177 lb 14.4 oz (80.695 kg)     SpO2: 99%   100%   No change in exam. Extensive wound medial left knee. Left fourth and fifth toes unchanged.  LABS: Lab Results  Component Value Date   WBC 12.7* 05/03/2014   HGB 10.0* 05/03/2014   HCT 29.4* 05/03/2014   MCV 92.7 05/03/2014   PLT 403* 05/03/2014   Lab Results  Component Value Date   CREATININE 1.04 05/01/2014   Lab Results  Component Value Date   INR 1.38 05/03/2014   CBG (last 3)   Recent Labs  05/01/14 1115  GLUCAP 103*    Principal Problem:   Ischemic left 5th toe likely secondary to emboli Active Problems:   CKD (chronic kidney disease) stage 3, GFR 30-59 ml/min   Hypertension   CVA (cerebral infarction)   Right hemiplegia   Left leg pain   Depression   Ulcer of left lower leg   CAD (coronary atherosclerotic disease)   Preop cardiovascular exam   RBBB (right bundle branch block)   Cardiomyopathy, dilated   History of DVT (deep vein thrombosis)    [name] Beeper: [phone] 05/03/2014

## 2014-05-04 NOTE — Progress Notes (Signed)
OT Cancellation Note and Discharge  Patient Details Name: Traci Mclaughlin MRN: [id number] DOB: [birth year]   Cancelled Treatment:    Reason Eval/Treat Not Completed: Patient at procedure or test/ unavailable. Pt in OR for amputation. OT will sign off and await new orders.  Almon Register [phone] 05/04/2014, 7:30 AM

## 2014-05-04 NOTE — Interval H&P Note (Signed)
History and Physical Interval Note:  05/04/2014 7:10 AM  Traci Mclaughlin  has presented today for surgery, with the diagnosis of Athscl native art of left leg w ulcer of heel and midfoot   The various methods of treatment have been discussed with the patient and family. After consideration of risks, benefits and other options for treatment, the patient has consented to  Procedure(s): AMPUTATION ABOVE KNEE (Left) as a surgical intervention .  The patient's history has been reviewed, patient examined, no change in status, stable for surgery.  I have reviewed the patient's chart and labs.  Questions were answered to the patient's satisfaction.     DICKSON,CHRISTOPHER S

## 2014-05-04 NOTE — Progress Notes (Signed)
UR complete.  Jacqueleen Pulver RN, MSN 

## 2014-05-04 NOTE — Progress Notes (Signed)
ANTICOAGULATION CONSULT NOTE - Follow Up Consult  Pharmacy Consult for heparin Indication: DVT  Labs:  Recent Labs  05/01/14 0415  05/02/14 0354 05/03/14 0350 05/04/14 0010  HGB 9.6*  --  9.7* 10.0* 9.0*  HCT 28.2*  --  29.0* 29.4* 26.9*  PLT 375  --  388 403* 355  APTT  --   < > 93* 76* 80*  LABPROT  --   --   --  17.0*  --   INR  --   --   --  1.38  --   HEPARINUNFRC >2.20*  --  1.46* 0.73* 0.34  CREATININE 1.04  --   --   --   --   < > = values in this interval not displayed.   Assessment/Plan:  63yo female therapeutic on heparin after resumed post-cath w/ plan to hold heparin this am for OR.  Will continue for now and f/u post-op.  Wynona Neat, PharmD, BCPS  05/04/2014,1:01 AM

## 2014-05-04 NOTE — Progress Notes (Signed)
PT Cancellation Note  Patient Details Name: Traci Mclaughlin MRN: [id number] DOB: 1951/04/25   Cancelled Treatment:    Reason Eval/Treat Not Completed: Patient at procedure or test/unavailable   Zakiah Gauthreaux F 05/04/2014, 7:06 AM

## 2014-05-04 NOTE — Telephone Encounter (Addendum)
Message copied by Gena Fray on Fri May 04, 2014 10:50 AM ------      Message from: Denman George      Created: Fri May 04, 2014  9:09 AM      Regarding: needs 4 wk f/u w/ [facility]                   ----- Message -----         From: Gabriel Earing, PA-C         Sent: 05/04/2014   8:58 AM           To: Vvs Charge Pool            S/P left AKA 05/04/14.  F/u with Dr. Scot Dock in 4 weeks.            Thanks,      Samantah ------  05/04/14: left msg for patient with daughter, dpm

## 2014-05-04 NOTE — Anesthesia Preprocedure Evaluation (Addendum)
Anesthesia Evaluation  Patient identified by MRN, date of birth, ID band Patient awake    Reviewed: Allergy & Precautions, H&P , NPO status , Patient's Chart, lab work & pertinent test results  History of Anesthesia Complications (+) history of anesthetic complications  Airway Mallampati: II TM Distance: >3 FB Neck ROM: Full    Dental  (+) Poor Dentition, Dental Advisory Given   Pulmonary former smoker,    Pulmonary exam normal       Cardiovascular hypertension, + CAD, + Past MI and + Peripheral Vascular Disease     Neuro/Psych PSYCHIATRIC DISORDERS Depression CVA    GI/Hepatic negative GI ROS, Neg liver ROS,   Endo/Other  negative endocrine ROS  Renal/GU Renal disease     Musculoskeletal   Abdominal   Peds  Hematology   Anesthesia Other Findings   Reproductive/Obstetrics                          Anesthesia Physical Anesthesia Plan  ASA: III  Anesthesia Plan: General   Post-op Pain Management:    Induction: Intravenous  Airway Management Planned: Oral ETT and LMA  Additional Equipment:   Intra-op Plan:   Post-operative Plan: Extubation in OR  Informed Consent: I have reviewed the patients History and Physical, chart, labs and discussed the procedure including the risks, benefits and alternatives for the proposed anesthesia with the patient or authorized representative who has indicated his/her understanding and acceptance.   Dental advisory given  Plan Discussed with: CRNA  Anesthesia Plan Comments:        Anesthesia Quick Evaluation

## 2014-05-04 NOTE — Transfer of Care (Signed)
Immediate Anesthesia Transfer of Care Note  Patient: Traci Mclaughlin  Procedure(s) Performed: Procedure(s): AMPUTATION ABOVE KNEE (Left)  Patient Location: PACU  Anesthesia Type:General  Level of Consciousness: awake, alert  and oriented  Airway & Oxygen Therapy: Patient Spontanous Breathing and Patient connected to nasal cannula oxygen  Post-op Assessment: Report given to PACU RN and Post -op Vital signs reviewed and stable  Post vital signs: Reviewed and stable  Complications: No apparent anesthesia complications

## 2014-05-04 NOTE — Progress Notes (Signed)
Patient back to floor from PACU. VSS, HTN but will monitor. O2 2L. Assessment complete. Patient voided on bed pan. R Leg elevated on pillow per order. MD paged. Patient comfortable, emotional and wants to sleep, will continue to monitor.

## 2014-05-04 NOTE — Progress Notes (Signed)
PROGRESS NOTE    Traci Mclaughlin VHQ:[id number] DOB: 03/22/1951 DOA: 04/30/2014 PCP: Elise Benne  HPI/Brief narrative 63 year old female patient s/p cardiac cath 2012 with normal coronaries, left MCA stroke with right hemiparesis August 2015 at which time noted to have NSTEMI and echo with wall motion abnormalities- repeat Cardiac Cath not felt prudent. TEE-no clot. LINQ implantable recorder placed. LLE DVT diagnosed 04/01/14 -started Xarelto. Symptomatic PAD of left leg. OP nuclear stress test 04/23/14-high risk scan with EF 35%. Patient now admitted with worsening pain in her left leg and new wound medial aspect of left knee. Vascular surgery plans left AKA on 10/9. Cardiology consulted for preop clearance. 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     Assessment/Plan:  1. PAD/ischemic (embolic) left fifth toe/ulcer medial left knee- s/p AKA 10/9: Vascular surgery followup appreciated-after cardiology clearance, patient underwent left AKA. She is incontinent of urine and somnolent postoperatively. We'll place Foley catheter temporarily to avoid soiling of surgical site. Continue IV vancomycin and Zosyn for now-consider DC post operatively in 48 hours. CIR consulted. 2. Left lower extremity DVT: Xarelto held and patient currently on Heparin infusion. Resume Xarelto when OK with VVS. 3. CAD/dilated cardiomyopathy/Recent NSTEMI: Cardiology consultation appreciated. Patient was deemed high risk candidate for surgery and underwent cardiac cath 10/8 for risk stratification. Cath showed single vessel CAD with total mid RCA occlusion and medical therapy was recommended. She was started on aspirin 81 mg, uptitrating beta blockers and ACE inhibitors.  4. Recent left MCA stroke with right hemiparesis: etiology unknown.TEE negative for clot. LINQ recorder in place-no A. fib thus far. Right hemiparesis improved/resolved 5. Uncontrolled hypertension:  Carvedilol dose increased. Added ramipril. Monitor. May need further titration. Currently elevated blood pressures likely secondary to pain. 6. History of depression: Continue Cymbalta. 7. Normocytic anemia: Stable. Follow CBCs postoperatively. 8. Stage II-3 CKD: creatinine normal. Monitor. 9. Hard of hearing: 10. Hypokalemia: replace and follow.   Code Status: Full Family Communication: None at bedside. Disposition Plan: Not medically stable for DC    Consultants:  Vascular Surgery  Cardiology  CIR  Procedures:  Left heart cath 10/8: Single-vessel coronary artery disease with total mid RCA occlusion   Left AKA 10/9  Antibiotics:  IV Vancomycin  IV Zosyn   Subjective: Seen postoperatively. Patient somnolent but easily arousable and denied complaints. Was emotional/tearful due to limb amputation.  Objective: Filed Vitals:   05/04/14 1009 05/04/14 1020 05/04/14 1103 05/04/14 1426  BP:  181/89 170/89 164/95  Pulse:  105 103 107  Temp: 98.2 F (36.8 C) 98.5 F (36.9 C)  98.7 F (37.1 C)  TempSrc:    Oral  Resp: 19 20 18 18   Height:      Weight:      SpO2:  98% 100% 99%    Intake/Output Summary (Last 24 hours) at 05/04/14 1619 Last data filed at 05/04/14 1035  Gross per 24 hour  Intake    990 ml  Output   1260 ml  Net   -270 ml   Filed Weights   04/30/14 1613 05/03/14 0441  Weight: 79.8 kg (175 lb 14.8 oz) 80.695 kg (177 lb 14.4 oz)     Exam:  General exam:  pleasant middle-aged female lying comfortably supine in bed.  Respiratory system: Clear. No increased work of breathing. Cardiovascular system: S1 & S2 heard, RRR. No JVD, murmurs, gallops, clicks or pedal edema. Telemetry: SR. Gastrointestinal system: Abdomen is nondistended, soft and nontender. Normal bowel  sounds heard. Central nervous system: Somnolent but easily arousable and oriented. No focal neurological deficits. Hard of hearing.  Extremities: Symmetric 5 x 5 power. LLE AKA stump  dressing clean and dry.    Data Reviewed: Basic Metabolic Panel:  Recent Labs Lab 04/30/14 1821 05/01/14 0415 05/04/14 0010  NA 135* 138 139  K 3.9 3.9 3.1*  CL 95* 96 100  CO2 22 23 24   GLUCOSE 112* 107* 92  BUN 17 17 9   CREATININE 0.94 1.04 0.86  CALCIUM 9.5 8.9 8.6  MG 1.6  --   --   PHOS 3.2  --   --    Liver Function Tests:  Recent Labs Lab 04/30/14 1821  AST 29  ALT 25  ALKPHOS 65  BILITOT 0.4  PROT 8.2  ALBUMIN 2.8*   No results found for this basename: LIPASE, AMYLASE,  in the last 168 hours No results found for this basename: AMMONIA,  in the last 168 hours CBC:  Recent Labs Lab 05/01/14 0415 05/02/14 0354 05/03/14 0350 05/04/14 0010  WBC 14.4* 12.5* 12.7* 10.6*  HGB 9.6* 9.7* 10.0* 9.0*  HCT 28.2* 29.0* 29.4* 26.9*  MCV 95.6 92.7 92.7 93.1  PLT 375 388 403* 355   Cardiac Enzymes: No results found for this basename: CKTOTAL, CKMB, CKMBINDEX, TROPONINI,  in the last 168 hours BNP (last 3 results) No results found for this basename: PROBNP,  in the last 8760 hours CBG:  Recent Labs Lab 05/01/14 1115 05/04/14 1127  GLUCAP 103* 104*    No results found for this or any previous visit (from the past 240 hour(s)).      Studies: No results found.      Scheduled Meds: . aspirin EC  81 mg Oral Daily  . atorvastatin  40 mg Oral q1800  . carvedilol  12.5 mg Oral BID WC  . [START ON 05/05/2014] docusate sodium  100 mg Oral Daily  . DULoxetine  30 mg Oral Daily  . HYDROmorphone      . HYDROmorphone      . pantoprazole  40 mg Oral Daily  . piperacillin-tazobactam (ZOSYN)  IV  3.375 g Intravenous 3 times per day  . ramipril  2.5 mg Oral Daily  . sodium chloride  3 mL Intravenous Q12H  . vancomycin  1,500 mg Intravenous Q24H   Continuous Infusions: . sodium chloride 150 mL/hr at 05/03/14 1732  . heparin 900 Units/hr (05/04/14 1503)    Principal Problem:   Ischemic left 5th toe likely secondary to emboli Active Problems:   CKD  (chronic kidney disease) stage 3, GFR 30-59 ml/min   Hypertension   CVA (cerebral infarction)   Right hemiplegia   Left leg pain   Depression   Ulcer of left lower leg   CAD (coronary atherosclerotic disease)   Preop cardiovascular exam   RBBB (right bundle branch block)   Cardiomyopathy, dilated   History of DVT (deep vein thrombosis)    Time spent:  25 minutes     HONGALGI,ANAND, MD, FACP, FHM. [facility] Pager [phone]  If 7PM-7AM, please contact night-coverage [email or web address] Password [email or web address] 05/04/2014, 4:19 PM    LOS: 4 days

## 2014-05-04 NOTE — Anesthesia Postprocedure Evaluation (Signed)
Anesthesia Post Note  Patient: Traci Mclaughlin  Procedure(s) Performed: Procedure(s) (LRB): AMPUTATION ABOVE KNEE (Left)  Anesthesia type: general  Patient location: PACU  Post pain: Pain level controlled  Post assessment: Patient's Cardiovascular Status Stable  Last Vitals:  Filed Vitals:   05/04/14 1009  BP:   Pulse:   Temp: 36.8 C  Resp: 19    Post vital signs: Reviewed and stable  Level of consciousness: sedated  Complications: No apparent anesthesia complications

## 2014-05-04 NOTE — Progress Notes (Signed)
ANTICOAGULATION CONSULT NOTE - Follow Up Consult  Pharmacy Consult for Heparin Indication: DVT/PE  No Known Allergies  Patient Measurements: Height: 5\' 3"  (160 cm) Weight: 177 lb 14.4 oz (80.695 kg) IBW/kg (Calculated) : 52.4 Heparin Dosing Weight:  70 kg  Vital Signs: Temp: 98.6 F (37 C) (10/09 2049) Temp Source: Oral (10/09 2049) BP: 143/67 mmHg (10/09 2049) Pulse Rate: 97 (10/09 2049)  Labs:  Recent Labs  05/02/14 0354 05/03/14 0350 05/04/14 0010 05/04/14 2037  HGB 9.7* 10.0* 9.0*  --   HCT 29.0* 29.4* 26.9*  --   PLT 388 403* 355  --   APTT 93* 76* 80*  --   LABPROT  --  17.0*  --   --   INR  --  1.38  --   --   HEPARINUNFRC 1.46* 0.73* 0.34 0.27*  CREATININE  --   --  0.86  --     Estimated Creatinine Clearance: 67.3 ml/min (by C-G formula based on Cr of 0.86).   Assessment: 63 yo female with hx CVA and NSTEMI. Now with worsening pain in left leg/toe c/w open wounds. 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 Started on Xarelto 9/6 (last dose 10/5). Changed to heparin for bridge to surgery. S/p cath 10/8. AKA 10/9. HL subtherapeutic at 0.27 on heparin 900 units/hr.  Goal of Therapy:  Heparin level 0.3-0.7 units/ml Monitor platelets by anticoagulation protocol: Yes   Plan:  Bolus heparin 1000 units Increase heparin to 1000 units/hr 6h HL  Andrey Cota. Diona Foley, PharmD Clinical Pharmacist Pager [phone] 05/04/2014,9:15 PM

## 2014-05-04 NOTE — Progress Notes (Signed)
[facility]ICINE AND [facility]ILITATION  CONSULT SERVICE NOTE  Pt had surgery this morning. Will follow over the weekend to judge activity tolerance and appropriateness for CIR  Meredith Staggers, MD, [facility] 05/04/2014

## 2014-05-05 LAB — CBC
HEMATOCRIT: 26.3 % — AB (ref 36.0–46.0)
Hemoglobin: 8.8 g/dL — ABNORMAL LOW (ref 12.0–15.0)
MCH: 31.2 pg (ref 26.0–34.0)
MCHC: 33.5 g/dL (ref 30.0–36.0)
MCV: 93.3 fL (ref 78.0–100.0)
PLATELETS: 348 10*3/uL (ref 150–400)
RBC: 2.82 MIL/uL — ABNORMAL LOW (ref 3.87–5.11)
RDW: 12.6 % (ref 11.5–15.5)
WBC: 12.2 10*3/uL — ABNORMAL HIGH (ref 4.0–10.5)

## 2014-05-05 LAB — BASIC METABOLIC PANEL
Anion gap: 17 — ABNORMAL HIGH (ref 5–15)
BUN: 9 mg/dL (ref 6–23)
CALCIUM: 8.7 mg/dL (ref 8.4–10.5)
CO2: 22 mEq/L (ref 19–32)
Chloride: 100 mEq/L (ref 96–112)
Creatinine, Ser: 0.78 mg/dL (ref 0.50–1.10)
GFR calc Af Amer: >90 mL/min (ref >90)
GFR, EST NON AFRICAN AMERICAN: 87 mL/min — AB (ref >90)
GLUCOSE: 97 mg/dL (ref 70–99)
Potassium: 3.3 mEq/L — ABNORMAL LOW (ref 3.7–5.3)
Sodium: 139 mEq/L (ref 137–147)

## 2014-05-05 LAB — HEPARIN LEVEL (UNFRACTIONATED)
Heparin Unfractionated: 0.28 IU/mL — ABNORMAL LOW (ref 0.30–0.70)
Heparin Unfractionated: 0.27 IU/mL — ABNORMAL LOW (ref 0.30–0.70)
Heparin Unfractionated: 0.35 IU/mL (ref 0.30–0.70)

## 2014-05-05 LAB — APTT: aPTT: 77 seconds — ABNORMAL HIGH (ref 24–37)

## 2014-05-05 LAB — VANCOMYCIN, TROUGH: Vancomycin Tr: 11 ug/mL (ref 10.0–20.0)

## 2014-05-05 MED ORDER — HEPARIN BOLUS VIA INFUSION
1000.0000 [IU] | Freq: Once | INTRAVENOUS | Status: AC
  Administered 2014-05-05: 1000 [IU] via INTRAVENOUS
  Filled 2014-05-05: qty 1000

## 2014-05-05 MED ORDER — HEPARIN (PORCINE) IN NACL 100-0.45 UNIT/ML-% IJ SOLN
1250.0000 [IU]/h | INTRAMUSCULAR | Status: AC
  Administered 2014-05-05 – 2014-05-07 (×3): 1250 [IU]/h via INTRAVENOUS
  Filled 2014-05-05 (×3): qty 250

## 2014-05-05 MED ORDER — HEPARIN (PORCINE) IN NACL 100-0.45 UNIT/ML-% IJ SOLN
1250.0000 [IU]/h | INTRAMUSCULAR | Status: DC
  Filled 2014-05-05: qty 250

## 2014-05-05 MED ORDER — POTASSIUM CHLORIDE CRYS ER 20 MEQ PO TBCR
40.0000 meq | EXTENDED_RELEASE_TABLET | Freq: Once | ORAL | Status: AC
  Administered 2014-05-05: 40 meq via ORAL
  Filled 2014-05-05: qty 2

## 2014-05-05 NOTE — Evaluation (Signed)
Physical Therapy Evaluation Patient Details Name: Traci Mclaughlin MRN: [id number] DOB: 11/11/1950 Today's Date: 05/05/2014   History of Present Illness  Patient admitted with h/o LLE DVT, PAD.  AKA performed on 10-9/15.  PMH pertinent for CVA 8/15.  Patient also extremely HOH.  Clinical Impression  Patient very fearful and anxious regarding mobility.  Once she was moving, she did very well.  Very labile and repetitive during session - unsure of baseline cognitive status.  Feel patient will physically progress well.  Main limiting factors will be pain, anxiety and HOH.  Feel patient will benefit from PT to progress mobility and independence.  Also feel patient will benefit from CIR to reach max potential for d/c home.      Follow Up Recommendations CIR    Equipment Recommendations  None recommended by PT    Recommendations for Other Services Rehab consult     Precautions / Restrictions Precautions Precautions: Fall Precaution Comments: LLE ALA Restrictions Weight Bearing Restrictions: No      Mobility  Bed Mobility Overal bed mobility: Needs Assistance Bed Mobility: Supine to Sit;Sit to Supine     Supine to sit: Min assist Sit to supine: Min assist   General bed mobility comments: Patient required cueing to scoot hips toward EOB and then was able to raise shoulders off bed independently.  required assistance to scoot in bed  Transfers                    Ambulation/Gait                Stairs            Wheelchair Mobility    Modified Rankin (Stroke Patients Only)       Balance   Sitting-balance support: No upper extremity supported Sitting balance-Leahy Scale: Good                                       Pertinent Vitals/Pain Pain Assessment: Faces Faces Pain Scale: Hurts whole lot Pain Intervention(s): Limited activity within patient's tolerance;Premedicated before session;Repositioned;Relaxation    Home Living  Family/patient expects to be discharged to:: Private residence Living Arrangements: Children Available Help at Discharge: Family;Friend(s) Type of Home: House Home Access: Stairs to enter Entrance Stairs-Rails: None Entrance Stairs-Number of Steps: 3-4 Home Layout: Two level;Able to live on main level with bedroom/bathroom Home Equipment: Walker - 2 wheels Additional Comments: no family present to confirm above, information gathered from previous admission; patient very [place] thus difficult to communicate    Prior Function           Comments: unable to detemine; patient states she was independent     Hand Dominance   Dominant Hand: Right    Extremity/Trunk Assessment   Upper Extremity Assessment: Overall WFL for tasks assessed           Lower Extremity Assessment: Overall WFL for tasks assessed         Communication   Communication: HOH  Cognition Arousal/Alertness: Awake/alert Behavior During Therapy: WFL for tasks assessed/performed Overall Cognitive Status: No family/caregiver present to determine baseline cognitive functioning         Following Commands: Follows one step commands consistently       General Comments: patient kept repeating herself, asking repeatedly why I wanted her to walk (when I told her we would only sit EOB).    General Comments  Exercises        Assessment/Plan    PT Assessment Patient needs continued PT services  PT Diagnosis Difficulty walking;Acute pain   PT Problem List Decreased activity tolerance;Decreased mobility;Pain  PT Treatment Interventions DME instruction;Gait training;Functional mobility training;Therapeutic activities;Therapeutic exercise;Patient/family education   PT Goals (Current goals can be found in the Care Plan section) Acute Rehab PT Goals Patient Stated Goal: stop hurting PT Goal Formulation: With patient (difficult due to hearing issue) Time For Goal Achievement: 05/19/14 Potential to Achieve  Goals: Good    Frequency Min 3X/week   Barriers to discharge        Co-evaluation               End of Session   Activity Tolerance: Patient limited by pain Patient left: in bed;with call bell/phone within reach Nurse Communication: Mobility status         Time: 2248-2500 PT Time Calculation (min): 46 min   Charges:   PT Evaluation $Initial PT Evaluation Tier I: 1 Procedure PT Treatments $Therapeutic Activity: 8-22 mins   PT G CodesShanna Cisco, [place] [phone] 05/05/2014, 11:31 AM

## 2014-05-05 NOTE — Progress Notes (Signed)
ANTICOAGULATION/ANTIBIOTIC CONSULT NOTE - Follow Up Consult  Pharmacy Consult for Heparin and Vancomycin and Zosyn Indication: DVT and LLL ulcer (s/p AKA)  No Known Allergies  Patient Measurements: Height: 5\' 3"  (160 cm) Weight: 177 lb 14.4 oz (80.695 kg) IBW/kg (Calculated) : 52.4 Heparin Dosing Weight:  70 kg  Vital Signs: Temp: 99 F (37.2 C) (10/10 1500) Temp Source: Oral (10/10 1500) BP: 172/86 mmHg (10/10 1500) Pulse Rate: 92 (10/10 1500)  Labs:  Recent Labs  05/03/14 0350 05/04/14 0010  05/05/14 0219 05/05/14 0951 05/05/14 1825  HGB 10.0* 9.0*  --  8.8*  --   --   HCT 29.4* 26.9*  --  26.3*  --   --   PLT 403* 355  --  348  --   --   APTT 76* 80*  --  77*  --   --   LABPROT 17.0*  --   --   --   --   --   INR 1.38  --   --   --   --   --   HEPARINUNFRC 0.73* 0.34  < > 0.28* 0.27* 0.35  CREATININE  --  0.86  --  0.78  --   --   < > = values in this interval not displayed.  Estimated Creatinine Clearance: 72.4 ml/min (by C-G formula based on Cr of 0.78).   Assessment: AC: Heparin for recent LLE DVT. Heparin level therapeutic on 1000 units/hr. No bleeding noted.   ID: Pt with LLE ulcer - s/p AKA 10/9. Pt on Zosyn and Vancomycin Day #6. Noted plan to d/c antibiotics past 10/11 doses. Vancomycin trough 11 mcg/ml - therapeutic. Afeb. Wbc slightly elevated.   Goal of Therapy:  Heparin level 0.3-0.7 units/ml Monitor platelets by anticoagulation protocol: Yes Vancomycin level 10-15 mcg/ml   Plan:  Continue heparin 1000 units/hr F/u a.m. heparin level Continue Vancomycin 1500mg  IV q24h Continue Zosyn 3.375gm IV q8h F/u d/c of abx tomorrow  Sherlon Handing, PharmD, BCPS Clinical pharmacist, pager [phone] 05/05/2014,7:47 PM

## 2014-05-05 NOTE — Progress Notes (Signed)
ANTICOAGULATION CONSULT NOTE - Follow Up Consult  Pharmacy Consult for heparin Indication: pulmonary embolus and DVT  Labs:  Recent Labs  05/03/14 0350 05/04/14 0010 05/04/14 2037 05/05/14 0219  HGB 10.0* 9.0*  --  8.8*  HCT 29.4* 26.9*  --  26.3*  PLT 403* 355  --  348  APTT 76* 80*  --  77*  LABPROT 17.0*  --   --   --   INR 1.38  --   --   --   HEPARINUNFRC 0.73* 0.34 0.27* 0.28*  CREATININE  --  0.86  --   --     Assessment: 63yo female remains subtherapeutic on heparin after rate increase.  Goal of Therapy:  Heparin level 0.3-0.7 units/ml   Plan:  Will increase heparin gtt by 1 unit/kg/hr to 1100 units/hr and check level in 6hr.  Wynona Neat, PharmD, BCPS  05/05/2014,3:52 AM

## 2014-05-05 NOTE — Progress Notes (Signed)
CSW (Clinical Education officer, museum) visited pt room as PT was finishing with pt. Per PT pt feeling very fearful and anxious today. For this reason CSW did not complete full assessment. However, CSW did introduce self to pt and informed pt that unit CSW will continue to follow incase pt has SNF needs at dc. Pt unable to hear CSW so this was communicated through writing. Pt voiced understanding of CSW role.  [place], [place]

## 2014-05-05 NOTE — Progress Notes (Signed)
PROGRESS NOTE    Traci Mclaughlin [id number] DOB: 02/27/1951 DOA: 04/30/2014 PCP: Elise Benne  HPI/Brief narrative 63 year old female patient s/p cardiac cath 2012 with normal coronaries, left MCA stroke with right hemiparesis August 2015 at which time noted to have NSTEMI and echo with wall motion abnormalities- repeat Cardiac Cath not felt prudent. TEE-no clot. LINQ implantable recorder placed. LLE DVT diagnosed 04/01/14 -started Xarelto. Symptomatic PAD of left leg. OP nuclear stress test 04/23/14-high risk scan with EF 35%. Patient now admitted with worsening pain in her left leg and new wound medial aspect of left knee. Vascular surgery plans left AKA on 10/9. Cardiology consulted for preop clearance. 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     Assessment/Plan:  1. PAD/ischemic (embolic) left fifth toe/ulcer medial left knee- s/p AKA 10/9: Vascular surgery followup appreciated-after cardiology clearance, patient underwent left AKA. She was incontinent of urine and somnolent postoperatively-  placed Foley catheter temporarily to avoid soiling of surgical site. Continue IV vancomycin and Zosyn for 48 hours postoperatively-DC after 10/11 doses. CIR consult  2. Left lower extremity DVT: Xarelto held and patient currently on Heparin infusion. Resume Xarelto on 05/07/14. 3. CAD/dilated cardiomyopathy/Recent NSTEMI: Cardiology consultation appreciated. Patient was deemed high risk candidate for surgery and underwent cardiac cath 10/8 for risk stratification. Cath showed single vessel CAD with total mid RCA occlusion and medical therapy was recommended. She was started on aspirin 81 mg, uptitrating beta blockers and ACE inhibitors.  4. Recent left MCA stroke with right hemiparesis: etiology unknown.TEE negative for clot. LINQ recorder in place-no A. fib thus far. Right hemiparesis improved/resolved 5. Uncontrolled hypertension:  Carvedilol dose increased. Added ramipril. Monitor. May need further titration. Improved.  6. History of depression: Continue Cymbalta. 7. Normocytic anemia: Stable. Transfuse if hemoglobin less than 7 g per DL. 8. Stage II-3 CKD: creatinine normal. Monitor. 9. Hard of hearing: 10. Hypokalemia: replace and follow.   Code Status: Full Family Communication: None at bedside. Disposition Plan: ? CIR when medically stable.   Consultants:  Vascular Surgery  Cardiology  CIR  Procedures:  Left heart cath 10/8: Single-vessel coronary artery disease with total mid RCA occlusion   Left AKA 10/9  Antibiotics:  IV Vancomycin  IV Zosyn   Subjective: Appropriate and controlled pain at left AKA stump site. Denies any other complaints.  Objective: Filed Vitals:   05/04/14 1103 05/04/14 1426 05/04/14 2049 05/05/14 0559  BP: 170/89 164/95 143/67 151/92  Pulse: 103 107 97 102  Temp:  98.7 F (37.1 C) 98.6 F (37 C) 99.1 F (37.3 C)  TempSrc:  Oral Oral Oral  Resp: 18 18  17   Height:      Weight:      SpO2: 100% 99% 99% 96%    Intake/Output Summary (Last 24 hours) at 05/05/14 1410 Last data filed at 05/05/14 0745  Gross per 24 hour  Intake      0 ml  Output      1 ml  Net     -1 ml   Filed Weights   04/30/14 1613 05/03/14 0441  Weight: 79.8 kg (175 lb 14.8 oz) 80.695 kg (177 lb 14.4 oz)     Exam:  General exam:  pleasant middle-aged female lying comfortably supine in bed.  Respiratory system: Clear. No increased work of breathing. Cardiovascular system: S1 & S2 heard, RRR. No JVD, murmurs, gallops, clicks or pedal edema. Telemetry: ST 100's. Gastrointestinal system: Abdomen is nondistended, soft and nontender.  Normal bowel sounds heard. Central nervous system: alert and oriented. No focal neurological deficits. Hard of hearing.  Extremities: Symmetric 5 x 5 power. LLE AKA stump dressing clean and dry.    Data Reviewed: Basic Metabolic Panel:  Recent Labs Lab  04/30/14 1821 05/01/14 0415 05/04/14 0010 05/05/14 0219  NA 135* 138 139 139  K 3.9 3.9 3.1* 3.3*  CL 95* 96 100 100  CO2 22 23 24 22   GLUCOSE 112* 107* 92 97  BUN 17 17 9 9   CREATININE 0.94 1.04 0.86 0.78  CALCIUM 9.5 8.9 8.6 8.7  MG 1.6  --   --   --   PHOS 3.2  --   --   --    Liver Function Tests:  Recent Labs Lab 04/30/14 1821  AST 29  ALT 25  ALKPHOS 65  BILITOT 0.4  PROT 8.2  ALBUMIN 2.8*   No results found for this basename: LIPASE, AMYLASE,  in the last 168 hours No results found for this basename: AMMONIA,  in the last 168 hours CBC:  Recent Labs Lab 05/01/14 0415 05/02/14 0354 05/03/14 0350 05/04/14 0010 05/05/14 0219  WBC 14.4* 12.5* 12.7* 10.6* 12.2*  HGB 9.6* 9.7* 10.0* 9.0* 8.8*  HCT 28.2* 29.0* 29.4* 26.9* 26.3*  MCV 95.6 92.7 92.7 93.1 93.3  PLT 375 388 403* 355 348   Cardiac Enzymes: No results found for this basename: CKTOTAL, CKMB, CKMBINDEX, TROPONINI,  in the last 168 hours BNP (last 3 results) No results found for this basename: PROBNP,  in the last 8760 hours CBG:  Recent Labs Lab 05/01/14 1115 05/04/14 1127  GLUCAP 103* 104*    No results found for this or any previous visit (from the past 240 hour(s)).      Studies: No results found.      Scheduled Meds: . aspirin EC  81 mg Oral Daily  . atorvastatin  40 mg Oral q1800  . carvedilol  12.5 mg Oral BID WC  . docusate sodium  100 mg Oral Daily  . DULoxetine  30 mg Oral Daily  . pantoprazole  40 mg Oral Daily  . piperacillin-tazobactam (ZOSYN)  IV  3.375 g Intravenous 3 times per day  . ramipril  2.5 mg Oral Daily  . sodium chloride  3 mL Intravenous Q12H  . vancomycin  1,500 mg Intravenous Q24H   Continuous Infusions: . sodium chloride 150 mL/hr at 05/03/14 1732  . heparin 1,250 Units/hr (05/05/14 1127)    Principal Problem:   Ischemic left 5th toe likely secondary to emboli Active Problems:   CKD (chronic kidney disease) stage 3, GFR 30-59 ml/min    Hypertension   CVA (cerebral infarction)   Right hemiplegia   Left leg pain   Depression   Ulcer of left lower leg   CAD (coronary atherosclerotic disease)   Preop cardiovascular exam   RBBB (right bundle branch block)   Cardiomyopathy, dilated   History of DVT (deep vein thrombosis)    Time spent:  25 minutes     Curvin Hunger, MD, FACP, FHM. [facility] Pager [phone]  If 7PM-7AM, please contact night-coverage [email or web address] Password [email or web address] 05/05/2014, 2:10 PM    LOS: 5 days

## 2014-05-05 NOTE — Progress Notes (Signed)
ANTICOAGULATION CONSULT NOTE - Follow Up Consult  Pharmacy Consult for Heparin Indication: DVT  No Known Allergies  Patient Measurements: Height: 5\' 3"  (160 cm) Weight: 177 lb 14.4 oz (80.695 kg) IBW/kg (Calculated) : 52.4 Heparin Dosing Weight: 69.7 kg  Vital Signs: Temp: 99.1 F (37.3 C) (10/10 0559) Temp Source: Oral (10/10 0559) BP: 151/92 mmHg (10/10 0559) Pulse Rate: 102 (10/10 0559)  Labs:  Recent Labs  05/03/14 0350 05/04/14 0010 05/04/14 2037 05/05/14 0219 05/05/14 0951  HGB 10.0* 9.0*  --  8.8*  --   HCT 29.4* 26.9*  --  26.3*  --   PLT 403* 355  --  348  --   APTT 76* 80*  --  77*  --   LABPROT 17.0*  --   --   --   --   INR 1.38  --   --   --   --   HEPARINUNFRC 0.73* 0.34 0.27* 0.28* 0.27*  CREATININE  --  0.86  --  0.78  --     Estimated Creatinine Clearance: 72.4 ml/min (by C-G formula based on Cr of 0.78).   Medications:  Scheduled:  . aspirin EC  81 mg Oral Daily  . atorvastatin  40 mg Oral q1800  . carvedilol  12.5 mg Oral BID WC  . docusate sodium  100 mg Oral Daily  . DULoxetine  30 mg Oral Daily  . pantoprazole  40 mg Oral Daily  . piperacillin-tazobactam (ZOSYN)  IV  3.375 g Intravenous 3 times per day  . potassium chloride  40 mEq Oral Once  . ramipril  2.5 mg Oral Daily  . sodium chloride  3 mL Intravenous Q12H  . vancomycin  1,500 mg Intravenous Q24H   Infusions:  . sodium chloride 150 mL/hr at 05/03/14 1732  . heparin 1,100 Units/hr (05/05/14 0355)   Assessment: Traci Mclaughlin is a 63 yo female with a PMH of left brain stroke, NSTEMI (6 weeks ago), Ileal occlusion, DVT (PTA Xarelto), ulcer fifth toe, and open wound left knee.  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  She was started on Xarelto 9/6, then changed to Heparin for bridge to surgery.  AKA was performed 10/9.  Today her heparin level is  at 0.27.  Hemoglobin 8.8, Hct 26.3 has been slowly  trending down, will continue to monitor for signs and symptoms of bleeding.  Platelets 348 are wnl.    Goal of Therapy:  Heparin level 0.3-0.7 units/ml Monitor platelets by anticoagulation protocol: Yes   Plan:  - Heparin 1000 units x 1 - Increase heparin infusion to 1250 units/hr - F/U HL 1930, daily CBC - Monitor for s/sx of bleeding  Hassie Bruce, Pharm. D. Clinical Pharmacy Resident Pager: [phone] Ph: [phone] 05/05/2014 11:02 AM

## 2014-05-05 NOTE — Progress Notes (Signed)
   VASCULAR SURGERY ASSESSMENT & PLAN:  * 1 Day Post-Op s/p: Left AKA  *  Begin dressing changes tomorrow.  SUBJECTIVE: Pain adequately controlled.   PHYSICAL EXAM: Filed Vitals:   05/04/14 1103 05/04/14 1426 05/04/14 2049 05/05/14 0559  BP: 170/89 164/95 143/67 151/92  Pulse: 103 107 97 102  Temp:  98.7 F (37.1 C) 98.6 F (37 C) 99.1 F (37.3 C)  TempSrc:  Oral Oral Oral  Resp: 18 18  17   Height:      Weight:      SpO2: 100% 99% 99% 96%   Dressing on Left AKA is dry.   LABS: Lab Results  Component Value Date   WBC 12.2* 05/05/2014   HGB 8.8* 05/05/2014   HCT 26.3* 05/05/2014   MCV 93.3 05/05/2014   PLT 348 05/05/2014   Lab Results  Component Value Date   CREATININE 0.78 05/05/2014   Lab Results  Component Value Date   INR 1.38 05/03/2014   CBG (last 3)   Recent Labs  05/04/14 1127  GLUCAP 104*    Principal Problem:   Ischemic left 5th toe likely secondary to emboli Active Problems:   CKD (chronic kidney disease) stage 3, GFR 30-59 ml/min   Hypertension   CVA (cerebral infarction)   Right hemiplegia   Left leg pain   Depression   Ulcer of left lower leg   CAD (coronary atherosclerotic disease)   Preop cardiovascular exam   RBBB (right bundle branch block)   Cardiomyopathy, dilated   History of DVT (deep vein thrombosis)   [name] Beeper: [phone] 05/05/2014

## 2014-05-06 LAB — CBC
HEMATOCRIT: 26.5 % — AB (ref 36.0–46.0)
Hemoglobin: 9.1 g/dL — ABNORMAL LOW (ref 12.0–15.0)
MCH: 31.7 pg (ref 26.0–34.0)
MCHC: 34.3 g/dL (ref 30.0–36.0)
MCV: 92.3 fL (ref 78.0–100.0)
Platelets: 372 10*3/uL (ref 150–400)
RBC: 2.87 MIL/uL — AB (ref 3.87–5.11)
RDW: 12.5 % (ref 11.5–15.5)
WBC: 12.9 10*3/uL — ABNORMAL HIGH (ref 4.0–10.5)

## 2014-05-06 LAB — BASIC METABOLIC PANEL
ANION GAP: 15 (ref 5–15)
BUN: 6 mg/dL (ref 6–23)
CO2: 24 meq/L (ref 19–32)
CREATININE: 0.72 mg/dL (ref 0.50–1.10)
Calcium: 8.6 mg/dL (ref 8.4–10.5)
Chloride: 98 mEq/L (ref 96–112)
GFR calc Af Amer: >90 mL/min (ref >90)
GFR calc non Af Amer: 89 mL/min — ABNORMAL LOW (ref >90)
Glucose, Bld: 128 mg/dL — ABNORMAL HIGH (ref 70–99)
POTASSIUM: 3.3 meq/L — AB (ref 3.7–5.3)
Sodium: 137 mEq/L (ref 137–147)

## 2014-05-06 LAB — HEPARIN LEVEL (UNFRACTIONATED): Heparin Unfractionated: 0.64 IU/mL (ref 0.30–0.70)

## 2014-05-06 MED ORDER — RAMIPRIL 5 MG PO CAPS
5.0000 mg | ORAL_CAPSULE | Freq: Every day | ORAL | Status: DC
  Administered 2014-05-06 – 2014-05-08 (×3): 5 mg via ORAL
  Filled 2014-05-06 (×3): qty 1

## 2014-05-06 MED ORDER — WHITE PETROLATUM GEL
Status: DC | PRN
  Filled 2014-05-06 (×2): qty 5

## 2014-05-06 MED ORDER — POTASSIUM CHLORIDE CRYS ER 20 MEQ PO TBCR
30.0000 meq | EXTENDED_RELEASE_TABLET | ORAL | Status: AC
  Administered 2014-05-06 (×2): 30 meq via ORAL
  Filled 2014-05-06 (×2): qty 1

## 2014-05-06 NOTE — Progress Notes (Signed)
PROGRESS NOTE    Traci Mclaughlin WJX:[id number] DOB: 1950-08-14 DOA: 04/30/2014 PCP: Elise Benne  HPI/Brief narrative 63 year old female patient s/p cardiac cath 2012 with normal coronaries, left MCA stroke with right hemiparesis August 2015 at which time noted to have NSTEMI and echo with wall motion abnormalities- repeat Cardiac Cath not felt prudent. TEE-no clot. LINQ implantable recorder placed. LLE DVT diagnosed 04/01/14 -started Xarelto. Symptomatic PAD of left leg. OP nuclear stress test 04/23/14-high risk scan with EF 35%. Patient now admitted with worsening pain in her left leg and new wound medial aspect of left knee. Cardiology consulted for preop clearance. 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 S/P left AKA. Progressing well. DC all antibiotics after today's dose. Change heparin to Xarelto on 10/12. Possible DC to CIR.    Assessment/Plan:  1. PAD/ischemic (embolic) left fifth toe/ulcer medial left knee- s/p AKA 10/9: Vascular surgery followup appreciated-after cardiology clearance, patient underwent left AKA. She was incontinent of urine and somnolent postoperatively-  placed Foley catheter temporarily to avoid soiling of surgical site. Continue IV vancomycin and Zosyn for 48 hours postoperatively-DC after 10/11 doses. CIR consult  2. Left lower extremity DVT: Xarelto held and patient currently on Heparin infusion. Resume Xarelto on 05/07/14. 3. CAD/dilated cardiomyopathy/Recent NSTEMI: Cardiology consultation appreciated. Patient was deemed high risk candidate for surgery and underwent cardiac cath 10/8 for risk stratification. Cath showed single vessel CAD with total mid RCA occlusion and medical therapy was recommended. She was started on aspirin 81 mg, uptitrating beta blockers and ACE inhibitors. Increase ramipril 5 mg daily. 4. Recent left MCA stroke with right hemiparesis: etiology unknown.TEE negative for clot. LINQ  recorder in place-no A. fib thus far. Right hemiparesis improved/resolved 5. Uncontrolled hypertension: Carvedilol dose increased. Added ramipril-increased dose. Monitor. May need further titration. Improved.  6. History of depression: Continue Cymbalta. 7. Normocytic anemia: Stable. Transfuse if hemoglobin less than 7 g per DL. 8. Stage II-3 CKD: creatinine normal. Monitor. 9. Hard of hearing: 10. Hypokalemia: replace and follow.   Code Status: Full Family Communication: None at bedside. Disposition Plan: ? CIR when medically stable.   Consultants:  Vascular Surgery  Cardiology  CIR  Procedures:  Left heart cath 10/8: Single-vessel coronary artery disease with total mid RCA occlusion   Left AKA 10/9  Antibiotics:  IV Vancomycin  IV Zosyn   Subjective: Denies any other complaints. Denies significant pain at left AKA site.  Objective: Filed Vitals:   05/05/14 0559 05/05/14 1500 05/05/14 2014 05/06/14 0500  BP: 151/92 172/86 140/72 163/85  Pulse: 102 92 87 94  Temp: 99.1 F (37.3 C) 99 F (37.2 C) 97.8 F (36.6 C) 99.4 F (37.4 C)  TempSrc: Oral Oral Oral Oral  Resp: 17 18 17 21   Height:      Weight:    78.4 kg (172 lb 13.5 oz)  SpO2: 96% 95% 97% 95%    Intake/Output Summary (Last 24 hours) at 05/06/14 1348 Last data filed at 05/06/14 1246  Gross per 24 hour  Intake    410 ml  Output   4227 ml  Net  -3817 ml   Filed Weights   04/30/14 1613 05/03/14 0441 05/06/14 0500  Weight: 79.8 kg (175 lb 14.8 oz) 80.695 kg (177 lb 14.4 oz) 78.4 kg (172 lb 13.5 oz)     Exam:  General exam:  pleasant middle-aged female lying comfortably supine in bed.  Respiratory system: Clear. No increased work of breathing. Cardiovascular system:  S1 & S2 heard, RRR. No JVD, murmurs, gallops, clicks or pedal edema. Telemetry: SR- DC'ed 10/11. Gastrointestinal system: Abdomen is nondistended, soft and nontender. Normal bowel sounds heard. Central nervous system: alert and  oriented. No focal neurological deficits. Hard of hearing.  Extremities: Symmetric 5 x 5 power. LLE AKA stump dressing clean and dry.    Data Reviewed: Basic Metabolic Panel:  Recent Labs Lab 04/30/14 1821 05/01/14 0415 05/04/14 0010 05/05/14 0219 05/06/14 0400  NA 135* 138 139 139 137  K 3.9 3.9 3.1* 3.3* 3.3*  CL 95* 96 100 100 98  CO2 22 23 24 22 24   GLUCOSE 112* 107* 92 97 128*  BUN 17 17 9 9 6   CREATININE 0.94 1.04 0.86 0.78 0.72  CALCIUM 9.5 8.9 8.6 8.7 8.6  MG 1.6  --   --   --   --   PHOS 3.2  --   --   --   --    Liver Function Tests:  Recent Labs Lab 04/30/14 1821  AST 29  ALT 25  ALKPHOS 65  BILITOT 0.4  PROT 8.2  ALBUMIN 2.8*   No results found for this basename: LIPASE, AMYLASE,  in the last 168 hours No results found for this basename: AMMONIA,  in the last 168 hours CBC:  Recent Labs Lab 05/02/14 0354 05/03/14 0350 05/04/14 0010 05/05/14 0219 05/06/14 0400  WBC 12.5* 12.7* 10.6* 12.2* 12.9*  HGB 9.7* 10.0* 9.0* 8.8* 9.1*  HCT 29.0* 29.4* 26.9* 26.3* 26.5*  MCV 92.7 92.7 93.1 93.3 92.3  PLT 388 403* 355 348 372   Cardiac Enzymes: No results found for this basename: CKTOTAL, CKMB, CKMBINDEX, TROPONINI,  in the last 168 hours BNP (last 3 results) No results found for this basename: PROBNP,  in the last 8760 hours CBG:  Recent Labs Lab 05/01/14 1115 05/04/14 1127  GLUCAP 103* 104*    No results found for this or any previous visit (from the past 240 hour(s)).      Studies: No results found.      Scheduled Meds: . aspirin EC  81 mg Oral Daily  . atorvastatin  40 mg Oral q1800  . carvedilol  12.5 mg Oral BID WC  . docusate sodium  100 mg Oral Daily  . DULoxetine  30 mg Oral Daily  . pantoprazole  40 mg Oral Daily  . piperacillin-tazobactam (ZOSYN)  IV  3.375 g Intravenous 3 times per day  . ramipril  5 mg Oral Daily  . sodium chloride  3 mL Intravenous Q12H  . vancomycin  1,500 mg Intravenous Q24H   Continuous  Infusions: . heparin 1,250 Units/hr (05/06/14 0917)    Principal Problem:   Ischemic left 5th toe likely secondary to emboli Active Problems:   CKD (chronic kidney disease) stage 3, GFR 30-59 ml/min   Hypertension   CVA (cerebral infarction)   Right hemiplegia   Left leg pain   Depression   Ulcer of left lower leg   CAD (coronary atherosclerotic disease)   Preop cardiovascular exam   RBBB (right bundle branch block)   Cardiomyopathy, dilated   History of DVT (deep vein thrombosis)    Time spent:  25 minutes     Jaceion Aday, MD, FACP, FHM. [facility] Pager [phone]  If 7PM-7AM, please contact night-coverage [email or web address] Password [email or web address] 05/06/2014, 1:48 PM    LOS: 6 days

## 2014-05-06 NOTE — Progress Notes (Signed)
ANTICOAGULATION CONSULT NOTE - Follow Up Consult  Pharmacy Consult for Heparin Indication: DVT  No Known Allergies  Patient Measurements: Height: 5\' 3"  (160 cm) Weight: 172 lb 13.5 oz (78.4 kg) IBW/kg (Calculated) : 52.4 Heparin Dosing Weight: 69.7 kg  Vital Signs: Temp: 99.4 F (37.4 C) (10/11 0500) Temp Source: Oral (10/11 0500) BP: 163/85 mmHg (10/11 0500) Pulse Rate: 94 (10/11 0500)  Labs:  Recent Labs  05/04/14 0010  05/05/14 0219 05/05/14 0951 05/05/14 1825 05/06/14 0400  HGB 9.0*  --  8.8*  --   --  9.1*  HCT 26.9*  --  26.3*  --   --  26.5*  PLT 355  --  348  --   --  372  APTT 80*  --  77*  --   --   --   HEPARINUNFRC 0.34  < > 0.28* 0.27* 0.35 0.64  CREATININE 0.86  --  0.78  --   --  0.72  < > = values in this interval not displayed.  Estimated Creatinine Clearance: 71.4 ml/min (by C-G formula based on Cr of 0.72).  Assessment: 63 yo female with hx CVA and NSTEMI. Now with worsening pain in left leg/toe c/w open wounds. Critical limb ischemia/nonhealing ulcer.  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 Started on Xarelto 9/6 (last dose 10/5). Changed to heparin for bridge to surgery. S/p cath 10/8. AKA 10/9. Heparin level 0.64 this AM. Hgb 9.1 stable.   Infectious Disease: Wound near L knee, L 5th toe wound, poor vascular flow, L AKA 10/9. Tmax 99.4. WBC 12.9 up. No cultures.  Vancomycin 10/5 >> - 10/10 VT: 11 ok Zosyn 10/5>>  Cardiovascular: HTN, PAD, CAD, HF, CM, recent STEMI, cardiology consulted to evaluate risk, cath 10/8 > medical management. EF 45-50%; BP elevated 163/85, HR 87-94, Meds: lipitor40, added asa81, ramipril, coreg  Neurology: CVA, depression, cymbalta. R hemiplegia  Nephrology: Scr 0.72  Hematology / Oncology: hx of cervical cancer - in remission; Hgb 9.1  PTA Medication Issues: amlodipine  Best Practices: IV heparin  Goal of Therapy:  Heparin  level 0.3-0.7 units/ml Monitor platelets by anticoagulation protocol: Yes   Plan:  Continue heparin at 1250 units/hr. MD note plans to resume Xarelto 10/12. Plan d/c abx after today's doses 10/11.   Tena Linebaugh S. Alford Highland, PharmD, BCPS Clinical Staff Pharmacist Pager [phone]  Eilene Ghazi Stillinger 05/06/2014,10:58 AM

## 2014-05-06 NOTE — Progress Notes (Signed)
Occupational Therapy Evaluation Patient Details Name: Traci Mclaughlin MRN: [id number] DOB: June 12, 1951 Today's Date: 05/06/2014    History of Present Illness Patient admitted with h/o LLE DVT, PAD. AKA performed on 10-9/15. PMH pertinent for CVA 8/15. Patient also extremely HOH.    Clinical Impression   PLOF not fully known and uncertain of pt's accuracy of information. Pt reports that she was independent with ADLs. Note that pt had CVA in August. Pt currently limited by pain, decreased endurance, decreased strength, and impaired cognition which limit her independence with ADLs. Pt will benefit from acute OT to increase independence and would be an excellent candidate for CIR.      Follow Up Recommendations  CIR;Supervision/Assistance - 24 hour    Equipment Recommendations  Other (comment) (TBD)    Recommendations for Other Services       Precautions / Restrictions Precautions Precautions: Fall Precaution Comments: LLE AKA Restrictions Weight Bearing Restrictions: No      Mobility Bed Mobility Overal bed mobility: Needs Assistance Bed Mobility: Sit to Supine       Sit to supine: Min assist   General bed mobility comments: Min (A) to assist LEs onto bed. Pt able to push throug Bil UEs and RLE to scoot herself up in bed  Transfers Overall transfer level: Needs assistance Equipment used: Rolling walker (2 wheeled) Transfers: Sit to/from [facility] Sit to Stand: Min assist;+2 safety/equipment Stand pivot transfers: Min assist;+2 safety/equipment       General transfer comment: Min (A) to power up and to stabilize RW. +2 for safety. Pt unable to clear RLE off floor and instead pushes through RW and "walks" her foot across the floor    Balance Overall balance assessment: Needs assistance Sitting-balance support: No upper extremity supported;Feet supported Sitting balance-Leahy Scale: Good     Standing balance support: Bilateral upper extremity  supported;During functional activity Standing balance-Leahy Scale: Poor                              ADL Overall ADL's : Needs assistance/impaired Eating/Feeding: Modified independent;Sitting   Grooming: Modified independent;Sitting   Upper Body Bathing: Minimal assitance;Sitting   Lower Body Bathing: Sit to/from stand;+2 for safety/equipment;Maximal assistance   Upper Body Dressing : Minimal assistance;Sitting   Lower Body Dressing: Total assistance;+2 for safety/equipment;Sit to/from stand   Toilet Transfer: Minimal assistance;+2 for safety/equipment;Stand-pivot;RW (recliner > bed)             General ADL Comments: Pt limited by pain and residual cognitive deficits. Pt demonstrates slow processing and difficulty problem solving, however is able to manage tasks with few VC's.      Vision                 Additional Comments: To be further assessed.    Perception Perception Perception Tested?: No   Praxis Praxis Praxis tested?: Within functional limits    Pertinent Vitals/Pain Pain Assessment: Faces Faces Pain Scale: Hurts even more Pain Location: Lt leg (phantom limb) Pain Intervention(s): Limited activity within patient's tolerance;Monitored during session;Repositioned     Hand Dominance Right   Extremity/Trunk Assessment Upper Extremity Assessment Upper Extremity Assessment: Overall WFL for tasks assessed   Lower Extremity Assessment Lower Extremity Assessment: Defer to PT evaluation   Cervical / Trunk Assessment Cervical / Trunk Assessment: Kyphotic   Communication Communication Communication: HOH   Cognition Arousal/Alertness: Awake/alert Behavior During Therapy: WFL for tasks assessed/performed Overall Cognitive Status: No family/caregiver present  to determine baseline cognitive functioning Area of Impairment: Following commands;Safety/judgement;Problem solving       Following Commands: Follows one step commands with increased  time Safety/Judgement: Decreased awareness of safety   Problem Solving: Slow processing;Requires verbal cues;Requires tactile cues General Comments: Pt responding appropriately, however difficult to communicate due to [facility]. Pt slow to respond and demonstrates some difficulty with following commands (although this is likely related to hearing impairment).                Home Living Family/patient expects to be discharged to:: Private residence Living Arrangements: Children Available Help at Discharge: Family;Friend(s) Type of Home: House Home Access: Stairs to enter [employer] of Steps: 3-4 Entrance Stairs-Rails: None Home Layout: Two level;Able to live on main level with bedroom/bathroom     Bathroom Shower/Tub: Teacher, early years/pre: Standard     Home Equipment: Environmental consultant - 2 wheels   Additional Comments: no family present to confirm above, information gathered from previous admission; patient very [place] thus difficult to communicate      Prior Functioning/Environment          Comments: unable to detemine; patient states she was independent    OT Diagnosis: Generalized weakness;Cognitive deficits;Acute pain   OT Problem List: Decreased strength;Decreased range of motion;Decreased activity tolerance;Impaired balance (sitting and/or standing);Decreased cognition;Decreased safety awareness;Decreased knowledge of use of DME or AE;Decreased knowledge of precautions;Pain   OT Treatment/Interventions: Self-care/ADL training;Therapeutic exercise;Neuromuscular education;Energy conservation;Therapeutic activities;Cognitive remediation/compensation;Patient/family education;Balance training    OT Goals(Current goals can be found in the care plan section) Acute Rehab OT Goals Patient Stated Goal: to get in bed and rest OT Goal Formulation: With patient Time For Goal Achievement: 05/20/14 Potential to Achieve Goals: Good ADL Goals Pt Will Perform Upper Body  Bathing: with modified independence;sitting Pt Will Perform Upper Body Dressing: with modified independence;sitting Pt Will Transfer to Toilet: with supervision;stand pivot transfer;bedside commode Pt Will Perform Toileting - Clothing Manipulation and hygiene: with supervision;sit to/from stand  OT Frequency: Min 2X/week    End of Session Equipment Utilized During Treatment: Gait belt;Rolling walker  Activity Tolerance: Patient tolerated treatment well Patient left: in bed;with call bell/phone within reach   Time: 1456-1516 OT Time Calculation (min): 20 min Charges:  OT General Charges $OT Visit: 1 Procedure OT Evaluation $Initial OT Evaluation Tier I: 1 Procedure OT Treatments $Self Care/Home Management : 8-22 mins  Villa Herb M 05/06/2014, 4:32 PM  Cyndie Chime, OTR/L Occupational Therapist [phone] (pager)

## 2014-05-06 NOTE — Progress Notes (Signed)
   VASCULAR SURGERY ASSESSMENT & PLAN:  * 2 Days Post-Op s/p: left above-the-knee amputation  *  Doing well. Begin daily dressing changes.  * Agree with discontinuing antibiotics after 48 hours.  SUBJECTIVE: Pain adequately controlled.  PHYSICAL EXAM: Filed Vitals:   05/05/14 0559 05/05/14 1500 05/05/14 2014 05/06/14 0500  BP: 151/92 172/86 140/72 163/85  Pulse: 102 92 87 94  Temp: 99.1 F (37.3 C) 99 F (37.2 C) 97.8 F (36.6 C) 99.4 F (37.4 C)  TempSrc: Oral Oral Oral Oral  Resp: 17 18 17 21   Height:      Weight:    172 lb 13.5 oz (78.4 kg)  SpO2: 96% 95% 97% 95%   Left above-the-knee amputation dressing changed. The incision looks good with no erythema or drainage. The stump was appears well perfused.  LABS: Lab Results  Component Value Date   WBC 12.9* 05/06/2014   HGB 9.1* 05/06/2014   HCT 26.5* 05/06/2014   MCV 92.3 05/06/2014   PLT 372 05/06/2014   Lab Results  Component Value Date   CREATININE 0.72 05/06/2014   Lab Results  Component Value Date   INR 1.38 05/03/2014   CBG (last 3)   Recent Labs  05/04/14 1127  GLUCAP 104*    Principal Problem:   Ischemic left 5th toe likely secondary to emboli Active Problems:   CKD (chronic kidney disease) stage 3, GFR 30-59 ml/min   Hypertension   CVA (cerebral infarction)   Right hemiplegia   Left leg pain   Depression   Ulcer of left lower leg   CAD (coronary atherosclerotic disease)   Preop cardiovascular exam   RBBB (right bundle branch block)   Cardiomyopathy, dilated   History of DVT (deep vein thrombosis)   [name] Beeper: [phone] 05/06/2014

## 2014-05-07 ENCOUNTER — Encounter: Payer: Self-pay | Admitting: Interventional Cardiology

## 2014-05-07 ENCOUNTER — Encounter (HOSPITAL_COMMUNITY): Payer: Self-pay | Admitting: Vascular Surgery

## 2014-05-07 DIAGNOSIS — I420 Dilated cardiomyopathy: Secondary | ICD-10-CM

## 2014-05-07 DIAGNOSIS — I998 Other disorder of circulatory system: Secondary | ICD-10-CM

## 2014-05-07 DIAGNOSIS — Z89612 Acquired absence of left leg above knee: Secondary | ICD-10-CM

## 2014-05-07 DIAGNOSIS — I739 Peripheral vascular disease, unspecified: Secondary | ICD-10-CM

## 2014-05-07 LAB — MAGNESIUM: Magnesium: 1.6 mg/dL (ref 1.5–2.5)

## 2014-05-07 LAB — BASIC METABOLIC PANEL
ANION GAP: 14 (ref 5–15)
BUN: 7 mg/dL (ref 6–23)
CALCIUM: 8.9 mg/dL (ref 8.4–10.5)
CHLORIDE: 100 meq/L (ref 96–112)
CO2: 23 meq/L (ref 19–32)
Creatinine, Ser: 0.9 mg/dL (ref 0.50–1.10)
GFR calc Af Amer: 77 mL/min — ABNORMAL LOW (ref >90)
GFR calc non Af Amer: 67 mL/min — ABNORMAL LOW (ref >90)
Glucose, Bld: 111 mg/dL — ABNORMAL HIGH (ref 70–99)
POTASSIUM: 3.8 meq/L (ref 3.7–5.3)
Sodium: 137 mEq/L (ref 137–147)

## 2014-05-07 LAB — HEPARIN LEVEL (UNFRACTIONATED): Heparin Unfractionated: 0.5 [IU]/mL (ref 0.30–0.70)

## 2014-05-07 MED ORDER — CARVEDILOL 6.25 MG PO TABS
18.7500 mg | ORAL_TABLET | Freq: Two times a day (BID) | ORAL | Status: DC
  Administered 2014-05-07 – 2014-05-08 (×3): 18.75 mg via ORAL
  Filled 2014-05-07 (×4): qty 1

## 2014-05-07 MED ORDER — RIVAROXABAN 20 MG PO TABS
20.0000 mg | ORAL_TABLET | Freq: Every day | ORAL | Status: DC
  Administered 2014-05-07 – 2014-05-08 (×2): 20 mg via ORAL
  Filled 2014-05-07 (×2): qty 1

## 2014-05-07 NOTE — Progress Notes (Signed)
Nurse offered to reposition patient . Patient refused . Facial grimacing . Nurse offered pain medication . Patient thinks the pain medication does not last long. Morphine IV offered and administered. Patient is sleeping at this time. Will continue to monitor.

## 2014-05-07 NOTE — Progress Notes (Signed)
Rehab admissions - Evaluated for possible admission.  I met with patient, but she is very hard of hearing.  I called patient's daughter with patient's permission.  Dtr prefers inpatient rehab here at [facility].  I will open the case and seek authorization for acute inpatient rehab admission.  Call me for questions.  #[phone]

## 2014-05-07 NOTE — Progress Notes (Signed)
Patient seem very upset when nurse came to check on patient. Patient complained that she rang out to the nurses station without response for help with bed pan. Nurse apologized and NT and nurse gave patient a bath and got her ready for surgery. Patient has been emotional all night leading to her surgery. Nurse offered physical and emotional support. Secretary called nurse once to ask if patient can talk and nurse responded yes. Nurse did not get any other phone call for this patient. Nurse was helping another patient to bedside commode. Patient thanked the nurse later.

## 2014-05-07 NOTE — Progress Notes (Signed)
PROGRESS NOTE    Traci Mclaughlin WUX:[id number] DOB: 12-26-1950 DOA: 04/30/2014 PCP: Elise Benne  HPI/Brief narrative 63 year old female patient s/p cardiac cath 2012 with normal coronaries, left MCA stroke with right hemiparesis August 2015 at which time noted to have NSTEMI and echo with wall motion abnormalities- repeat Cardiac Cath not felt prudent. TEE-no clot. LINQ implantable recorder placed. LLE DVT diagnosed 04/01/14 -started Xarelto. Symptomatic PAD of left leg. OP nuclear stress test 04/23/14-high risk scan with EF 35%. Patient now admitted with worsening pain in her left leg and new wound medial aspect of left knee. Cardiology consulted for preop clearance. 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 S/P left AKA. Progressing well. DC all antibiotics after today's dose. Change heparin to Xarelto on 10/12. Possible DC to CIR.    Assessment/Plan:  1. PAD/ischemic (embolic) left fifth toe/ulcer medial left knee- s/p AKA 10/9: Vascular surgery followup appreciated-after cardiology clearance, patient underwent left AKA. She was incontinent of urine and somnolent postoperatively-  placed Foley catheter temporarily to avoid soiling of surgical site. Continued IV vancomycin and Zosyn for 48 hours postoperatively and then discontinued. Awaiting rehabilitation decision. 2. Left lower extremity DVT: Xarelto held and patient currently on Heparin infusion. Resume Xarelto on 05/07/14. 3. CAD/dilated cardiomyopathy/Recent NSTEMI: Cardiology consultation appreciated. Patient was deemed high risk candidate for surgery and underwent cardiac cath 10/8 for risk stratification. Cath showed single vessel CAD with total mid RCA occlusion and medical therapy was recommended. She was started on aspirin 81 mg, uptitrating beta blockers and ACE inhibitors. Increase ramipril 5 mg daily. 4. Recent left MCA stroke with right hemiparesis: etiology unknown.TEE  negative for clot. LINQ recorder in place-no A. fib thus far. Right hemiparesis improved/resolved 5. Uncontrolled hypertension: Carvedilol dose increased. Added ramipril-increased dose. Monitor. May need further titration. Improved.  6. History of depression: Continue Cymbalta. 7. Normocytic anemia: Stable. Transfuse if hemoglobin less than 7 g per DL. 8. Stage II-3 CKD: creatinine normal. Monitor. 9. Hard of hearing: 10. Hypokalemia: replace and follow.   Code Status: Full Family Communication: None at bedside. Disposition Plan: ? CIR when medically stable.   Consultants:  Vascular Surgery  Cardiology-signed off  CIR  Procedures:  Left heart cath 10/8: Single-vessel coronary artery disease with total mid RCA occlusion   Left AKA 10/9  Foley catheter-DC  Antibiotics:  IV Vancomycin-discontinued  IV Zosyn-discontinued   Subjective: Denies any other complaints. Denies significant pain at left AKA site. Wants Foley catheter removed.  Objective: Filed Vitals:   05/06/14 1354 05/06/14 2049 05/07/14 0413 05/07/14 1314  BP: 142/66 141/71 160/76 123/67  Pulse: 91 95 88 90  Temp: 97.9 F (36.6 C) 97.9 F (36.6 C) 98.2 F (36.8 C) 99.6 F (37.6 C)  TempSrc: Oral Oral Oral Oral  Resp: 18 18 20 18   Height:      Weight:      SpO2: 99% 94% 100% 98%    Intake/Output Summary (Last 24 hours) at 05/07/14 1840 Last data filed at 05/07/14 0435  Gross per 24 hour  Intake      0 ml  Output    650 ml  Net   -650 ml   Filed Weights   04/30/14 1613 05/03/14 0441 05/06/14 0500  Weight: 79.8 kg (175 lb 14.8 oz) 80.695 kg (177 lb 14.4 oz) 78.4 kg (172 lb 13.5 oz)     Exam:  General exam:  pleasant middle-aged female sitting comfortably on chair this morning.  Respiratory system:  Clear. No increased work of breathing. Cardiovascular system: S1 & S2 heard, RRR. No JVD, murmurs, gallops, clicks or pedal edema.  Gastrointestinal system: Abdomen is nondistended, soft and  nontender. Normal bowel sounds heard. Central nervous system: alert and oriented. No focal neurological deficits. Hard of hearing.  Extremities: Symmetric 5 x 5 power. LLE AKA stump dressing clean and dry.    Data Reviewed: Basic Metabolic Panel:  Recent Labs Lab 05/01/14 0415 05/04/14 0010 05/05/14 0219 05/06/14 0400 05/07/14 0621  NA 138 139 139 137 137  K 3.9 3.1* 3.3* 3.3* 3.8  CL 96 100 100 98 100  CO2 23 24 22 24 23   GLUCOSE 107* 92 97 128* 111*  BUN 17 9 9 6 7   CREATININE 1.04 0.86 0.78 0.72 0.90  CALCIUM 8.9 8.6 8.7 8.6 8.9  MG  --   --   --   --  1.6   Liver Function Tests: No results found for this basename: AST, ALT, ALKPHOS, BILITOT, PROT, ALBUMIN,  in the last 168 hours No results found for this basename: LIPASE, AMYLASE,  in the last 168 hours No results found for this basename: AMMONIA,  in the last 168 hours CBC:  Recent Labs Lab 05/02/14 0354 05/03/14 0350 05/04/14 0010 05/05/14 0219 05/06/14 0400  WBC 12.5* 12.7* 10.6* 12.2* 12.9*  HGB 9.7* 10.0* 9.0* 8.8* 9.1*  HCT 29.0* 29.4* 26.9* 26.3* 26.5*  MCV 92.7 92.7 93.1 93.3 92.3  PLT 388 403* 355 348 372   Cardiac Enzymes: No results found for this basename: CKTOTAL, CKMB, CKMBINDEX, TROPONINI,  in the last 168 hours BNP (last 3 results) No results found for this basename: PROBNP,  in the last 8760 hours CBG:  Recent Labs Lab 05/01/14 1115 05/04/14 1127  GLUCAP 103* 104*    No results found for this or any previous visit (from the past 240 hour(s)).      Studies: No results found.      Scheduled Meds: . aspirin EC  81 mg Oral Daily  . atorvastatin  40 mg Oral q1800  . carvedilol  18.75 mg Oral BID WC  . docusate sodium  100 mg Oral Daily  . DULoxetine  30 mg Oral Daily  . pantoprazole  40 mg Oral Daily  . ramipril  5 mg Oral Daily  . rivaroxaban  20 mg Oral Q supper  . sodium chloride  3 mL Intravenous Q12H   Continuous Infusions:    Principal Problem:   Ischemic left  5th toe likely secondary to emboli Active Problems:   CKD (chronic kidney disease) stage 3, GFR 30-59 ml/min   Hypertension   CVA (cerebral infarction)   Right hemiplegia   Left leg pain   Depression   Ulcer of left lower leg   CAD (coronary atherosclerotic disease)   Preop cardiovascular exam   RBBB (right bundle branch block)   Cardiomyopathy, dilated   History of DVT (deep vein thrombosis)    Time spent:  20 minutes     Traci Maiden, MD, FACP, FHM. [facility] Pager [phone]  If 7PM-7AM, please contact night-coverage [email or web address] Password [email or web address] 05/07/2014, 6:40 PM    LOS: 7 days

## 2014-05-07 NOTE — Progress Notes (Signed)
ANTICOAGULATION CONSULT NOTE - Follow Up Consult  Pharmacy Consult for Heparin -> Xarelto Indication: DVT  No Known Allergies  Patient Measurements: Height: 5\' 3"  (160 cm) Weight: 172 lb 13.5 oz (78.4 kg) IBW/kg (Calculated) : 52.4 Heparin Dosing Weight: 69.7 kg  Vital Signs: Temp: 98.2 F (36.8 C) (10/12 0413) Temp Source: Oral (10/12 0413) BP: 160/76 mmHg (10/12 0413) Pulse Rate: 88 (10/12 0413)  Labs:  Recent Labs  05/05/14 0219  05/05/14 1825 05/06/14 0400 05/07/14 0621 05/07/14 0624  HGB 8.8*  --   --  9.1*  --   --   HCT 26.3*  --   --  26.5*  --   --   PLT 348  --   --  372  --   --   APTT 77*  --   --   --   --   --   HEPARINUNFRC 0.28*  < > 0.35 0.64  --  0.50  CREATININE 0.78  --   --  0.72 0.90  --   < > = values in this interval not displayed.  Estimated Creatinine Clearance: 63.4 ml/min (by C-G formula based on Cr of 0.9).  Assessment:  Heparin level therapeutic (0.50) today on 1250 units/hr.  Per Dr. Algis Liming, will transition back to Xarelto today as planned.  Goal of Therapy:  Heparin level 0.3-0.7 units/ml full-anticoagulation with Xarelto Monitor platelets by anticoagulation protocol: Yes   Plan:   Resume Xarelto 20 mg daily with supper.  Heparin drip to stop at the time of the first Xarelto dose.  Will resume Xarelto with lunch today, to stop heparin sooner.  Arty Baumgartner, [place] Pager: [phone] 05/07/2014,12:13 PM

## 2014-05-07 NOTE — Progress Notes (Signed)
PT Cancellation Note  Patient Details Name: Traci Mclaughlin MRN: [id number] DOB: 10-08-1950   Cancelled Treatment:    Reason Eval/Treat Not Completed: Patient declined, no reason specified 05/07/2014  Donnella Sham, PT [phone]  (pager)   Elisabeth Strom, Tessie Fass 05/07/2014, 12:43 PM

## 2014-05-07 NOTE — Progress Notes (Signed)
Cardiologist: Dr. Casandra Doffing  Electrophysiologist: Dr. Thompson Grayer    Patient Name: Traci Mclaughlin Date of Encounter: 05/07/2014  Principal Problem:   Ischemic left 5th toe likely secondary to emboli Active Problems:   CKD (chronic kidney disease) stage 3, GFR 30-59 ml/min   Hypertension   CVA (cerebral infarction)   Right hemiplegia   Left leg pain   Depression   Ulcer of left lower leg   CAD (coronary atherosclerotic disease)   Preop cardiovascular exam   RBBB (right bundle branch block)   Cardiomyopathy, dilated   History of DVT (deep vein thrombosis)    Patient Profile: 63 yo female w/ hx PVD, abnl MV 09/30, DVT on Xarelto, CVA, LE ulcers, HTN,  for pain control and ABX. LE amputation needed Cardiology eval w/ cath pre-op. Cath w/ RCA 100%, EF 45%. Left AKA on 10/09.  SUBJECTIVE: Very HOH, denies pain or SOB.   OBJECTIVE Filed Vitals:   05/06/14 0500 05/06/14 1354 05/06/14 2049 05/07/14 0413  BP: 163/85 142/66 141/71 160/76  Pulse: 94 91 95 88  Temp: 99.4 F (37.4 C) 97.9 F (36.6 C) 97.9 F (36.6 C) 98.2 F (36.8 C)  TempSrc: Oral Oral Oral Oral  Resp: 21 18 18 20   Height:      Weight: 172 lb 13.5 oz (78.4 kg)     SpO2: 95% 99% 94% 100%    Intake/Output Summary (Last 24 hours) at 05/07/14 0755 Last data filed at 05/07/14 0435  Gross per 24 hour  Intake    360 ml  Output   1202 ml  Net   -842 ml   Filed Weights   04/30/14 1613 05/03/14 0441 05/06/14 0500  Weight: 175 lb 14.8 oz (79.8 kg) 177 lb 14.4 oz (80.695 kg) 172 lb 13.5 oz (78.4 kg)    PHYSICAL EXAM General: Well developed, well nourished, female in no acute distress. Head: Normocephalic, atraumatic. HARD of HEARING Neck: Supple without bruits, JVD not elevated. Lungs:  Resp regular and unlabored, CTA. Heart: RRR, S1, S2, no S3, S4, or murmur; no rub. Abdomen: Soft, non-tender, non-distended, BS + x 4.  Extremities: No clubbing, cyanosis, no R edema. S/p Left AKA, Catheter Neuro:  Alert and oriented X 3. Moves all extremities spontaneously. Psych: Normal affect.  LABS: CBC: Recent Labs  05/05/14 0219 05/06/14 0400  WBC 12.2* 12.9*  HGB 8.8* 9.1*  HCT 26.3* 26.5*  MCV 93.3 92.3  PLT 348 502   Basic Metabolic Panel: Recent Labs  05/06/14 0400 05/07/14 0621  NA 137 137  K 3.3* 3.8  CL 98 100  CO2 24 23  GLUCOSE 128* 111*  BUN 6 7  CREATININE 0.72 0.90  CALCIUM 8.6 8.9  MG  --  1.6   TELE:  Not on      Current Medications:  . aspirin EC  81 mg Oral Daily  . atorvastatin  40 mg Oral q1800  . carvedilol  12.5 mg Oral BID WC  . docusate sodium  100 mg Oral Daily  . DULoxetine  30 mg Oral Daily  . pantoprazole  40 mg Oral Daily  . ramipril  5 mg Oral Daily  . sodium chloride  3 mL Intravenous Q12H   . heparin 1,250 Units/hr (05/07/14 7741)    ASSESSMENT AND PLAN: Principal Problem:   Ischemic left 5th toe likely secondary to emboli - per IM/VVS  Active Problems:   CKD (chronic kidney disease) stage 3, GFR 30-59 ml/min - Stable, per IM  Hypertension - SBP 140s-170s last 48 hr, HR not low, will increase Coreg to 18.75 mg bid. Otherwise, per IM    CVA (cerebral infarction) - Per IM    Right hemiplegia - per IM    Left leg pain - per IM    Depression - per IM    Ulcer of left lower leg - per IM    CAD (coronary atherosclerotic disease) - Single vessel dz with RCA totaled, separate ostia for LAD, CFX, EF 45-50%, med rx. On ASA, ACE, BB. Add nitrates in future if symptoms occur.    Preop cardiovascular exam - see above    RBBB (right bundle branch block) - follow    Cardiomyopathy, dilated - see above    History of DVT (deep vein thrombosis) - per IM   Signed, Rosaria Ferries , PA-C 7:55 AM 05/07/2014  Personally seen and examined. Agree with above. Will sign off. Please call with any questions.

## 2014-05-07 NOTE — Progress Notes (Signed)
   VASCULAR SURGERY ASSESSMENT & PLAN:  * 3 Days Post-Op s/p: Left AKA  *  Continue dressing changes.  SUBJECTIVE: Resting comfortably  PHYSICAL EXAM: Filed Vitals:   05/06/14 0500 05/06/14 1354 05/06/14 2049 05/07/14 0413  BP: 163/85 142/66 141/71 160/76  Pulse: 94 91 95 88  Temp: 99.4 F (37.4 C) 97.9 F (36.6 C) 97.9 F (36.6 C) 98.2 F (36.8 C)  TempSrc: Oral Oral Oral Oral  Resp: 21 18 18 20   Height:      Weight: 172 lb 13.5 oz (78.4 kg)     SpO2: 95% 99% 94% 100%   Dressing dry. Wound inspected yesterday and looked good.   LABS: Lab Results  Component Value Date   WBC 12.9* 05/06/2014   HGB 9.1* 05/06/2014   HCT 26.5* 05/06/2014   MCV 92.3 05/06/2014   PLT 372 05/06/2014   Lab Results  Component Value Date   CREATININE 0.72 05/06/2014   Lab Results  Component Value Date   INR 1.38 05/03/2014   CBG (last 3)   Recent Labs  05/04/14 1127  GLUCAP 104*    Principal Problem:   Ischemic left 5th toe likely secondary to emboli Active Problems:   CKD (chronic kidney disease) stage 3, GFR 30-59 ml/min   Hypertension   CVA (cerebral infarction)   Right hemiplegia   Left leg pain   Depression   Ulcer of left lower leg   CAD (coronary atherosclerotic disease)   Preop cardiovascular exam   RBBB (right bundle branch block)   Cardiomyopathy, dilated   History of DVT (deep vein thrombosis)   [name] Beeper: [phone] 05/07/2014

## 2014-05-07 NOTE — Progress Notes (Signed)
Dressing changed to left stump per orders. Patient tolerated procedure well. No oozing or drainage noted from surgical site. Clean dressing applied and stump wrapped in Ace bandage. Alfredo Bach RN BSN 05/07/2014 2:19 PM

## 2014-05-08 ENCOUNTER — Encounter (HOSPITAL_COMMUNITY): Payer: Self-pay | Admitting: *Deleted

## 2014-05-08 ENCOUNTER — Inpatient Hospital Stay (HOSPITAL_COMMUNITY)
Admission: RE | Admit: 2014-05-08 | Discharge: 2014-05-25 | DRG: 516 | Disposition: A | Discharge status: Home Health | Payer: Medicare Other | Source: Intra-hospital | Attending: Physical Medicine & Rehabilitation | Admitting: Physical Medicine & Rehabilitation

## 2014-05-08 DIAGNOSIS — F329 Major depressive disorder, single episode, unspecified: Secondary | ICD-10-CM | POA: Diagnosis not present

## 2014-05-08 DIAGNOSIS — I82402 Acute embolism and thrombosis of unspecified deep veins of left lower extremity: Secondary | ICD-10-CM

## 2014-05-08 DIAGNOSIS — D72829 Elevated white blood cell count, unspecified: Secondary | ICD-10-CM | POA: Diagnosis not present

## 2014-05-08 DIAGNOSIS — Z9221 Personal history of antineoplastic chemotherapy: Secondary | ICD-10-CM

## 2014-05-08 DIAGNOSIS — E876 Hypokalemia: Secondary | ICD-10-CM | POA: Diagnosis not present

## 2014-05-08 DIAGNOSIS — Z7901 Long term (current) use of anticoagulants: Secondary | ICD-10-CM

## 2014-05-08 DIAGNOSIS — N179 Acute kidney failure, unspecified: Secondary | ICD-10-CM | POA: Diagnosis present

## 2014-05-08 DIAGNOSIS — I959 Hypotension, unspecified: Secondary | ICD-10-CM | POA: Diagnosis not present

## 2014-05-08 DIAGNOSIS — R4701 Aphasia: Secondary | ICD-10-CM | POA: Diagnosis not present

## 2014-05-08 DIAGNOSIS — Z4781 Encounter for orthopedic aftercare following surgical amputation: Secondary | ICD-10-CM | POA: Diagnosis present

## 2014-05-08 DIAGNOSIS — I82812 Embolism and thrombosis of superficial veins of left lower extremities: Secondary | ICD-10-CM | POA: Diagnosis not present

## 2014-05-08 DIAGNOSIS — I2582 Chronic total occlusion of coronary artery: Secondary | ICD-10-CM

## 2014-05-08 DIAGNOSIS — F419 Anxiety disorder, unspecified: Secondary | ICD-10-CM

## 2014-05-08 DIAGNOSIS — I82412 Acute embolism and thrombosis of left femoral vein: Secondary | ICD-10-CM

## 2014-05-08 DIAGNOSIS — D62 Acute posthemorrhagic anemia: Secondary | ICD-10-CM | POA: Diagnosis not present

## 2014-05-08 DIAGNOSIS — Z89612 Acquired absence of left leg above knee: Secondary | ICD-10-CM | POA: Diagnosis not present

## 2014-05-08 DIAGNOSIS — Z8541 Personal history of malignant neoplasm of cervix uteri: Secondary | ICD-10-CM

## 2014-05-08 DIAGNOSIS — I10 Essential (primary) hypertension: Secondary | ICD-10-CM | POA: Diagnosis not present

## 2014-05-08 DIAGNOSIS — K5900 Constipation, unspecified: Secondary | ICD-10-CM

## 2014-05-08 DIAGNOSIS — K648 Other hemorrhoids: Secondary | ICD-10-CM | POA: Diagnosis not present

## 2014-05-08 DIAGNOSIS — N183 Chronic kidney disease, stage 3 unspecified: Secondary | ICD-10-CM

## 2014-05-08 DIAGNOSIS — I69351 Hemiplegia and hemiparesis following cerebral infarction affecting right dominant side: Secondary | ICD-10-CM

## 2014-05-08 DIAGNOSIS — Z86718 Personal history of other venous thrombosis and embolism: Secondary | ICD-10-CM | POA: Diagnosis not present

## 2014-05-08 DIAGNOSIS — S78112A Complete traumatic amputation at level between left hip and knee, initial encounter: Secondary | ICD-10-CM

## 2014-05-08 DIAGNOSIS — K626 Ulcer of anus and rectum: Secondary | ICD-10-CM | POA: Diagnosis not present

## 2014-05-08 DIAGNOSIS — I82432 Acute embolism and thrombosis of left popliteal vein: Secondary | ICD-10-CM

## 2014-05-08 DIAGNOSIS — Z91199 Patient's noncompliance with other medical treatment and regimen due to unspecified reason: Secondary | ICD-10-CM

## 2014-05-08 DIAGNOSIS — I420 Dilated cardiomyopathy: Secondary | ICD-10-CM

## 2014-05-08 DIAGNOSIS — H9190 Unspecified hearing loss, unspecified ear: Secondary | ICD-10-CM | POA: Diagnosis not present

## 2014-05-08 DIAGNOSIS — Z9111 Patient's noncompliance with dietary regimen: Secondary | ICD-10-CM

## 2014-05-08 DIAGNOSIS — I639 Cerebral infarction, unspecified: Secondary | ICD-10-CM | POA: Diagnosis present

## 2014-05-08 DIAGNOSIS — I252 Old myocardial infarction: Secondary | ICD-10-CM | POA: Diagnosis not present

## 2014-05-08 DIAGNOSIS — Z87891 Personal history of nicotine dependence: Secondary | ICD-10-CM | POA: Diagnosis not present

## 2014-05-08 DIAGNOSIS — I739 Peripheral vascular disease, unspecified: Secondary | ICD-10-CM

## 2014-05-08 DIAGNOSIS — Z79899 Other long term (current) drug therapy: Secondary | ICD-10-CM | POA: Diagnosis not present

## 2014-05-08 DIAGNOSIS — I2510 Atherosclerotic heart disease of native coronary artery without angina pectoris: Secondary | ICD-10-CM | POA: Diagnosis not present

## 2014-05-08 DIAGNOSIS — R509 Fever, unspecified: Secondary | ICD-10-CM

## 2014-05-08 DIAGNOSIS — K922 Gastrointestinal hemorrhage, unspecified: Secondary | ICD-10-CM

## 2014-05-08 DIAGNOSIS — G8191 Hemiplegia, unspecified affecting right dominant side: Secondary | ICD-10-CM

## 2014-05-08 DIAGNOSIS — K625 Hemorrhage of anus and rectum: Secondary | ICD-10-CM | POA: Diagnosis not present

## 2014-05-08 DIAGNOSIS — L97929 Non-pressure chronic ulcer of unspecified part of left lower leg with unspecified severity: Secondary | ICD-10-CM

## 2014-05-08 DIAGNOSIS — S78119A Complete traumatic amputation at level between unspecified hip and knee, initial encounter: Secondary | ICD-10-CM

## 2014-05-08 DIAGNOSIS — F32A Depression, unspecified: Secondary | ICD-10-CM | POA: Diagnosis present

## 2014-05-08 HISTORY — DX: Chronic kidney disease, unspecified: N18.9

## 2014-05-08 MED ORDER — WHITE PETROLATUM GEL
Status: DC | PRN
  Filled 2014-05-08: qty 5

## 2014-05-08 MED ORDER — ATORVASTATIN CALCIUM 40 MG PO TABS
40.0000 mg | ORAL_TABLET | Freq: Every day | ORAL | Status: DC
  Administered 2014-05-09 – 2014-05-25 (×17): 40 mg via ORAL
  Filled 2014-05-08 (×17): qty 1

## 2014-05-08 MED ORDER — DULOXETINE HCL 30 MG PO CPEP
30.0000 mg | ORAL_CAPSULE | Freq: Every day | ORAL | Status: DC
  Administered 2014-05-09 – 2014-05-11 (×3): 30 mg via ORAL
  Filled 2014-05-08 (×4): qty 1

## 2014-05-08 MED ORDER — CARVEDILOL 6.25 MG PO TABS: 18.7500 mg | ORAL_TABLET | Freq: Two times a day (BID) | ORAL | Status: DC

## 2014-05-08 MED ORDER — TRAZODONE HCL 50 MG PO TABS
25.0000 mg | ORAL_TABLET | Freq: Every evening | ORAL | Status: DC | PRN
  Administered 2014-05-08 – 2014-05-10 (×2): 50 mg via ORAL
  Filled 2014-05-08 (×3): qty 1

## 2014-05-08 MED ORDER — RIVAROXABAN 20 MG PO TABS
20.0000 mg | ORAL_TABLET | Freq: Every day | ORAL | Status: DC
  Administered 2014-05-09 – 2014-05-15 (×7): 20 mg via ORAL
  Filled 2014-05-08 (×8): qty 1

## 2014-05-08 MED ORDER — DOCUSATE SODIUM 100 MG PO CAPS
100.0000 mg | ORAL_CAPSULE | Freq: Every day | ORAL | Status: DC
  Administered 2014-05-09 – 2014-05-14 (×6): 100 mg via ORAL
  Filled 2014-05-08 (×7): qty 1

## 2014-05-08 MED ORDER — TRAMADOL HCL 50 MG PO TABS
50.0000 mg | ORAL_TABLET | Freq: Four times a day (QID) | ORAL | Status: DC | PRN
  Administered 2014-05-09 – 2014-05-10 (×3): 50 mg via ORAL
  Filled 2014-05-08 (×3): qty 1

## 2014-05-08 MED ORDER — PANTOPRAZOLE SODIUM 40 MG PO TBEC
40.0000 mg | DELAYED_RELEASE_TABLET | Freq: Every day | ORAL | Status: DC
  Administered 2014-05-09 – 2014-05-16 (×8): 40 mg via ORAL
  Filled 2014-05-08 (×9): qty 1

## 2014-05-08 MED ORDER — DIPHENHYDRAMINE HCL 12.5 MG/5ML PO ELIX: 12.5000 mg | ORAL_SOLUTION | Freq: Four times a day (QID) | ORAL | Status: DC | PRN

## 2014-05-08 MED ORDER — RAMIPRIL 5 MG PO CAPS
5.0000 mg | ORAL_CAPSULE | Freq: Every day | ORAL | Status: DC
  Administered 2014-05-09 – 2014-05-16 (×8): 5 mg via ORAL
  Filled 2014-05-08 (×11): qty 1

## 2014-05-08 MED ORDER — ASPIRIN EC 81 MG PO TBEC
81.0000 mg | DELAYED_RELEASE_TABLET | Freq: Every day | ORAL | Status: DC
  Administered 2014-05-09: 81 mg via ORAL
  Filled 2014-05-08 (×2): qty 1

## 2014-05-08 MED ORDER — ACETAMINOPHEN 325 MG PO TABS
325.0000 mg | ORAL_TABLET | ORAL | Status: DC | PRN
  Administered 2014-05-16: 650 mg via ORAL
  Filled 2014-05-08: qty 2

## 2014-05-08 MED ORDER — BISACODYL 10 MG RE SUPP
10.0000 mg | Freq: Every day | RECTAL | Status: DC | PRN
  Administered 2014-05-15: 10 mg via RECTAL
  Filled 2014-05-08 (×2): qty 1

## 2014-05-08 MED ORDER — PROCHLORPERAZINE 25 MG RE SUPP
12.5000 mg | Freq: Four times a day (QID) | RECTAL | Status: DC | PRN
  Filled 2014-05-08: qty 1

## 2014-05-08 MED ORDER — OXYCODONE HCL ER 10 MG PO T12A
10.0000 mg | EXTENDED_RELEASE_TABLET | Freq: Two times a day (BID) | ORAL | Status: DC
  Administered 2014-05-08 – 2014-05-10 (×5): 10 mg via ORAL
  Filled 2014-05-08 (×5): qty 1

## 2014-05-08 MED ORDER — METHOCARBAMOL 500 MG PO TABS
500.0000 mg | ORAL_TABLET | Freq: Four times a day (QID) | ORAL | Status: DC | PRN
  Administered 2014-05-11 – 2014-05-14 (×2): 500 mg via ORAL
  Filled 2014-05-08 (×2): qty 1

## 2014-05-08 MED ORDER — RAMIPRIL 5 MG PO CAPS: 5.0000 mg | ORAL_CAPSULE | Freq: Every day | ORAL | Status: DC

## 2014-05-08 MED ORDER — ALUM & MAG HYDROXIDE-SIMETH 200-200-20 MG/5ML PO SUSP: 30.0000 mL | ORAL | Status: DC | PRN

## 2014-05-08 MED ORDER — PROCHLORPERAZINE MALEATE 5 MG PO TABS
5.0000 mg | ORAL_TABLET | Freq: Four times a day (QID) | ORAL | Status: DC | PRN
Start: 2014-05-08 — End: 2014-05-26
  Filled 2014-05-08: qty 2

## 2014-05-08 MED ORDER — FLEET ENEMA 7-19 GM/118ML RE ENEM: 1.0000 | ENEMA | Freq: Once | RECTAL | Status: AC | PRN

## 2014-05-08 MED ORDER — OXYCODONE HCL 5 MG PO TABS
5.0000 mg | ORAL_TABLET | ORAL | Status: DC | PRN
  Administered 2014-05-09 – 2014-05-17 (×9): 10 mg via ORAL
  Administered 2014-05-18: 5 mg via ORAL
  Administered 2014-05-19 – 2014-05-20 (×3): 10 mg via ORAL
  Filled 2014-05-08 (×4): qty 2
  Filled 2014-05-08: qty 1
  Filled 2014-05-08 (×7): qty 2

## 2014-05-08 MED ORDER — PROCHLORPERAZINE EDISYLATE 5 MG/ML IJ SOLN
5.0000 mg | Freq: Four times a day (QID) | INTRAMUSCULAR | Status: DC | PRN
Start: 2014-05-08 — End: 2014-05-26
  Filled 2014-05-08: qty 2

## 2014-05-08 MED ORDER — GUAIFENESIN-DM 100-10 MG/5ML PO SYRP: 5.0000 mL | ORAL_SOLUTION | Freq: Four times a day (QID) | ORAL | Status: DC | PRN

## 2014-05-08 MED ORDER — POLYETHYLENE GLYCOL 3350 17 G PO PACK
17.0000 g | PACK | Freq: Every day | ORAL | Status: DC | PRN
  Administered 2014-05-11: 17 g via ORAL
  Filled 2014-05-08: qty 1

## 2014-05-08 MED ORDER — INFLUENZA VAC SPLIT QUAD 0.5 ML IM SUSY
0.5000 mL | PREFILLED_SYRINGE | INTRAMUSCULAR | Status: DC
  Filled 2014-05-08: qty 0.5

## 2014-05-08 MED ORDER — CARVEDILOL 6.25 MG PO TABS
18.7500 mg | ORAL_TABLET | Freq: Two times a day (BID) | ORAL | Status: DC
  Administered 2014-05-09 – 2014-05-25 (×33): 18.75 mg via ORAL
  Filled 2014-05-08 (×39): qty 1

## 2014-05-08 MED ORDER — GUAIFENESIN-DM 100-10 MG/5ML PO SYRP
15.0000 mL | ORAL_SOLUTION | ORAL | Status: DC | PRN
Start: 2014-05-08 — End: 2014-05-26

## 2014-05-08 NOTE — Discharge Summary (Signed)
Physician Discharge Summary  Traci Mclaughlin HKU:[id number] DOB: 1951/01/29 DOA: 04/30/2014  PCP: [name], PA-C  Admit date: 04/30/2014 Discharge date: 05/08/2014  Time spent: Greater than 30 minutes  Recommendations for Outpatient Follow-up:  1. Dr. 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 2. Periodically check labs-BMP and CBC weekly. 3. [name], PA-C/PCP following discharge from inpatient rehabilitation. 4. Dr. 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 likely secondary to emboli Active Problems:   CKD (chronic kidney disease) stage 3, GFR 30-59 ml/min   Hypertension   CVA (cerebral infarction)   Right hemiplegia   Left leg pain   Depression   Ulcer of left lower leg   CAD (coronary atherosclerotic disease)   Preop cardiovascular exam   RBBB (right bundle branch block)   Cardiomyopathy, dilated   History of DVT (deep vein thrombosis)   Discharge Condition: Improved & Stable  Diet recommendation: Heart healthy diet  Filed Weights   04/30/14 1613 05/03/14 0441 05/06/14 0500  Weight: 79.8 kg (175 lb 14.8 oz) 80.695 kg (177 lb 14.4 oz) 78.4 kg (172 lb 13.5 oz)    History of present illness:  63 year old female patient s/p cardiac cath 2012 with normal coronaries, left MCA stroke with right hemiparesis August 2015 at which time noted to have NSTEMI and echo with wall motion abnormalities- repeat Cardiac Cath not felt prudent. TEE-no clot. LINQ implantable recorder placed. LLE DVT diagnosed 04/01/14 -started Xarelto. Symptomatic PAD of left leg. OP nuclear stress test 04/23/14-high risk scan with EF 35%. Patient now admitted with worsening pain in her left leg and new wound medial aspect of left knee. Cardiology consulted for preop clearance. 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 S/P left AKA. Progressing well. DC all  antibiotics after today's dose. Change heparin to Xarelto on 10/12. Possible DC to CIR.  Hospital Course:   1. PAD/ischemic (embolic) left fifth toe/ulcer medial left knee- s/p AKA 10/9: Vascular surgery followup appreciated-after cardiology clearance, patient underwent left AKA. She was incontinent of urine and somnolent postoperatively- placed Foley catheter temporarily to avoid soiling of surgical site-now discontinued. Continued IV vancomycin and Zosyn for 48 hours postoperatively and then discontinued. To inpatient rehabilitation. Followup with vascular surgery in 4 weeks for removal of staples. 2. Left lower extremity DVT: Xarelto held and patient was placed on Heparin infusion. Resumed Xarelto on 05/07/14. 3. CAD/dilated cardiomyopathy/Recent NSTEMI: Cardiology consultation appreciated. Patient was deemed high risk candidate for surgery and underwent cardiac cath 10/8 for risk stratification. Cath showed single vessel CAD with total mid RCA occlusion and medical therapy was recommended. She was started on aspirin 81 mg, uptitrating beta blockers and ACE inhibitors. Increase ramipril 5 mg daily. Discussed with Dr. Marlou Porch, cardiology on 10/13 who advised discontinuing aspirin due to increased risk of bleeding while on Xarelto. 4. Recent left MCA stroke with right hemiparesis: etiology unknown.TEE negative for clot. LINQ recorder in place-no A. fib thus far. Right hemiparesis improved/resolved 5. Uncontrolled hypertension: Carvedilol dose increased. Added ramipril-increased dose. Monitor. May need further titration. Improved.  6. History of depression: Continue Cymbalta. 7. Normocytic anemia: Stable. Transfuse if hemoglobin less than 7 g per DL. 8. Stage II-3 CKD: creatinine normal. Monitor. 9. Hard of hearing: 10. Hypokalemia: replaced.  Consultants:  Vascular Surgery  Cardiology-signed off  CIR  Procedures:  Left heart cath 10/8: Single-vessel coronary artery disease with total mid RCA  occlusion  Left AKA 10/9  Foley catheter-DC    Discharge Exam:  Complaints:  Patient complained of some pain at AKA site while movement this morning. Denies any other complaints. Foley discontinued yesterday and voiding without difficulty.  Filed Vitals:   05/07/14 2038 05/08/14 0507 05/08/14 0839 05/08/14 1333  BP: 135/60 168/83 147/73 125/64  Pulse: 87 95 92 78  Temp: 99.3 F (37.4 C) 98.7 F (37.1 C) 97.4 F (36.3 C) 98.1 F (36.7 C)  TempSrc: Oral Oral Oral Oral  Resp: 19 17 16 18   Height:      Weight:      SpO2: 96% 98% 97% 97%   General exam: pleasant middle-aged female sitting comfortably on chair this morning.  Respiratory system: Clear. No increased work of breathing.  Cardiovascular system: S1 & S2 heard, RRR. No JVD, murmurs, gallops, clicks or pedal edema.  Gastrointestinal system: Abdomen is nondistended, soft and nontender. Normal bowel sounds heard.  Central nervous system: alert and oriented. No focal neurological deficits. Hard of hearing.  Extremities: Symmetric 5 x 5 power. LLE AKA stump dressing clean and dry.   Discharge Instructions      Discharge Instructions   Call MD for:  redness, tenderness, or signs of infection (pain, swelling, redness, odor or green/yellow discharge around incision site)    Complete by:  As directed      Call MD for:  severe uncontrolled pain    Complete by:  As directed      Call MD for:  temperature >100.4    Complete by:  As directed      Diet - low sodium heart healthy    Complete by:  As directed      Discharge wound care:    Complete by:  As directed   Left AKA dressing care- as per Dr. Scot Dock.     Increase activity slowly    Complete by:  As directed             Medication List    STOP taking these medications       acetaminophen 500 MG tablet  Commonly known as:  TYLENOL     amLODipine 10 MG tablet  Commonly known as:  NORVASC     clindamycin 300 MG capsule  Commonly known as:  CLEOCIN      HYDROcodone-acetaminophen 7.5-325 MG per tablet  Commonly known as:  NORCO     metoprolol tartrate 25 MG tablet  Commonly known as:  LOPRESSOR      TAKE these medications       atorvastatin 40 MG tablet  Commonly known as:  LIPITOR  Take 40 mg by mouth daily at 6 PM.     carvedilol 6.25 MG tablet  Commonly known as:  COREG  Take 3 tablets (18.75 mg total) by mouth 2 (two) times daily with a meal.     DSS 100 MG Caps  Take 100 mg by mouth 2 (two) times daily.     DULoxetine 30 MG capsule  Commonly known as:  CYMBALTA  Take 1 capsule (30 mg total) by mouth daily.     oxyCODONE-acetaminophen 10-325 MG per tablet  Commonly known as:  PERCOCET  Take 1 tablet by mouth every 6 (six) hours as needed for pain.     ramipril 5 MG capsule  Commonly known as:  ALTACE  Take 1 capsule (5 mg total) by mouth daily.     rivaroxaban 20 MG Tabs tablet  Commonly known as:  Alveda Reasons  Take 1 tablet (20 mg total) by mouth daily with supper.         The results of significant diagnostics from this hospitalization (including imaging, microbiology, ancillary and laboratory) are listed below for reference.    Significant Diagnostic Studies: No results found.  Microbiology: No results found for this or any previous visit (from the past 240 hour(s)).   Labs: Basic Metabolic Panel:  Recent Labs Lab 05/04/14 0010 05/05/14 0219 05/06/14 0400 05/07/14 0621  NA 139 139 137 137  K 3.1* 3.3* 3.3* 3.8  CL 100 100 98 100  CO2 24 22 24 23   GLUCOSE 92 97 128* 111*  BUN 9 9 6 7   CREATININE 0.86 0.78 0.72 0.90  CALCIUM 8.6 8.7 8.6 8.9  MG  --   --   --  1.6   Liver Function Tests: No results found for this basename: AST, ALT, ALKPHOS, BILITOT, PROT, ALBUMIN,  in the last 168 hours No results found for this basename: LIPASE, AMYLASE,  in the last 168 hours No results found for this basename: AMMONIA,  in the last 168 hours CBC:  Recent Labs Lab 05/02/14 0354 05/03/14 0350  05/04/14 0010 05/05/14 0219 05/06/14 0400  WBC 12.5* 12.7* 10.6* 12.2* 12.9*  HGB 9.7* 10.0* 9.0* 8.8* 9.1*  HCT 29.0* 29.4* 26.9* 26.3* 26.5*  MCV 92.7 92.7 93.1 93.3 92.3  PLT 388 403* 355 348 372   Cardiac Enzymes: No results found for this basename: CKTOTAL, CKMB, CKMBINDEX, TROPONINI,  in the last 168 hours BNP: BNP (last 3 results) No results found for this basename: PROBNP,  in the last 8760 hours CBG:  Recent Labs Lab 05/04/14 1127  GLUCAP 104*       Signed:  Janelis Stelzer, MD, FACP, FHM. [facility] Pager [phone]  If 7PM-7AM, please contact night-coverage [email or web address] Password [email or web address] 05/08/2014, 2:23 PM

## 2014-05-08 NOTE — Progress Notes (Addendum)
Physical Therapy Treatment Patient Details Name: Traci Mclaughlin MRN: [id number] DOB: Dec 08, 1950 Today's Date: 05/08/2014    History of Present Illness Patient admitted with h/o LLE DVT, PAD. AKA performed on 10-9/15. PMH pertinent for CVA 8/15. Patient also extremely HOH.     PT Comments    Pt expressing frustration with her change in function, tearful and limited activity tolerance. Written communication most effective for pt due to with speech in slow, low tone, with increased volume pt visibly frustrated with trying to understand so used  Written and visual cues as education throughout. Pt stating she just wants to go home but appears to lack awareness of mobility and function needed to get there. Educated pt fo progression of mobility, strength and activity to progress function with pt stating understanding but after not being successful with hopping she shut down and didn't want to attempt trying anything else despite reassurance and encouragement. Pt did stand for 5 min with RW and attempted wiggling foot to shuffle but unable to fully push through bil UE to off weight RLe to attempt to pick it up or hop. Pt educated for armrest push ups and encouraged to perform. Pt may benefit from chaplain or counselor session about her change in status.  Follow Up Recommendations        Equipment Recommendations       Recommendations for Other Services       Precautions / Restrictions Precautions Precautions: Fall Precaution Comments: LLE AKA    Mobility  Bed Mobility Overal bed mobility: Needs Assistance Bed Mobility: Supine to Sit     Supine to sit: Min guard     General bed mobility comments: pt in recliner on arrival  Transfers Overall transfer level: Needs assistance   Transfers: Stand Pivot Transfers Sit to Stand: Min assist Stand pivot transfers: Min assist       General transfer comment: min assist with cues for hand placement, sequence to scoot to edge of chair  and not following cues to reach back for chair to sit.   Ambulation/Gait                 Stairs            Wheelchair Mobility    Modified Rankin (Stroke Patients Only)       Balance     Sitting balance-Leahy Scale: Good       Standing balance-Leahy Scale: Poor                      Cognition Arousal/Alertness: Awake/alert Behavior During Therapy:  (labile, fearful, frustrated) Overall Cognitive Status: No family/caregiver present to determine baseline cognitive functioning Area of Impairment: Following commands;Problem solving       Following Commands: Follows one step commands with increased time (with written and visual instruction)     Problem Solving: Slow processing;Difficulty sequencing;Requires verbal cues;Requires tactile cues General Comments: Pt responding appropriately, however difficult to communicate due to [facility] and impaired speech. Some difficulty with commands even with written and visual instruction simultaneously.    Exercises      General Comments        Pertinent Vitals/Pain Pain Assessment: Faces Pain Score: 4  Faces Pain Scale: Hurts whole lot Pain Location: LLE Pain Intervention(s): Repositioned;Premedicated before session    Home Living                      Prior Function  PT Goals (current goals can now be found in the care plan section) Progress towards PT goals: Progressing toward goals (very slowly)    Frequency       PT Plan Current plan remains appropriate    Co-evaluation             End of Session Equipment Utilized During Treatment: Gait belt Activity Tolerance: Patient limited by pain Patient left: in chair;with call bell/phone within reach     Time: 1127-1150 PT Time Calculation (min): 23 min  Charges:  $Therapeutic Activity: 23-37 mins                    G CodesMelford Aase 2014-05-31, 1:46 PM Elwyn Reach, [place]

## 2014-05-08 NOTE — Progress Notes (Addendum)
    Subjective  - No complaints of pain.  Objective 168/83 95 98.7 F (37.1 C) (Oral) 17 98%  Intake/Output Summary (Last 24 hours) at 05/08/14 0742 Last data filed at 05/07/14 2020  Gross per 24 hour  Intake      0 ml  Output      0 ml  Net      0 ml   Left AKA dressing clean and dry  Assessment/Planning: POD #4 s/p: Left AKA Possible CIR today F/U with Dr. Scot Dock 4 weeks for staples out  Traci Mclaughlin 05/08/2014 7:42 AM  Agree with note above. Left AKA healing nicely.  Deitra Mayo, MD, [facility] [phone] 05/08/2014

## 2014-05-08 NOTE — Progress Notes (Signed)
Rehab admissions - I have authorization for acute inpatient rehab admission.  I did hear back from the daughter and family would like inpatient rehab admission here at [facility].  I am now waiting for bed availability on the rehab unit.  I should know soon if I can get a bed on rehab for patient today.  Call me for questions.  #[phone]

## 2014-05-08 NOTE — Interval H&P Note (Signed)
Chan Rosasco was admitted today to Inpatient Rehabilitation with the diagnosis of left AKA.  The patient's history has been reviewed, patient examined, and there is no change in status.  Patient continues to be appropriate for intensive inpatient rehabilitation.  I have reviewed the patient's chart and labs.  Questions were answered to the patient's satisfaction.  Greycen Felter T 05/08/2014, 5:52 PM

## 2014-05-08 NOTE — H&P (Signed)
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 Patient well known to CIR from prior stay. She developed left toe ulcers as well as left knee wound with infection and worsening of pain. She was admitted on 04/30/14 for pain control as well as IV antibiotics for treatment of infection. 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 She was cleared for surgery by Dr. Burt Knack and underwent L-AKA by Dr. Scot Dock on 05/04/14. PT/OT evaluations done and CIR recommended for follow up therapy. She is limited by anxiety, lability, communicative deficits (does not use sign language/ minimal ability to read lips) as well as pain.     Review of Systems  Unable to perform ROS: other    Past Medical History  Diagnosis Date  . Acute kidney injury 07/06/2011  . Hypertension   . HOH (hard of hearing)   . CVA (cerebral infarction)   . PAD (peripheral artery disease) 04/10/2014  . Cancer     cervical; was on chemo; has been in remission for 55yr  . Depression 04/19/2014  . Hx of cardiovascular stress test     Nuclear (9/15):  Inf ischemia, EF 35%; high risk  . Stroke Aug. 10, 2015  . STEMI (ST elevation myocardial infarction) 06/2011  . RBBB (right bundle branch block) 05/02/2014    Past Surgical History  Procedure Laterality Date  . Cardiac catheterization  07/03/2011    Dr. VIrish Lack . Tee without cardioversion N/A 03/08/2014    Procedure: TRANSESOPHAGEAL ECHOCARDIOGRAM (TEE);  Surgeon: PFay Records MD;  [facility];  Service: Cardiovascular;  Laterality: N/A;  . Loop recorder implant  03-12-2014    MDT LINQ implanted by Dr ARayann Hemanfor cryptogenic  stroke  . Amputation Left 05/04/2014    Procedure: AMPUTATION ABOVE KNEE;  Surgeon: CAngelia Mould MD;  [facility];  Service: Vascular;  Laterality: Left;    Family History  Problem Relation Age of Onset  . Intracerebral hemorrhage Daughter     during childbirth  . Stroke Daughter   . Heart attack Neg Hx   . Diabetes Mother   . Alcohol abuse Mother   . Lung cancer Father   . COPD Father     Social History: Lives with family. Per reports that she has quit smoking. Her smoking use included Cigarettes. She has a 1 pack-year smoking history. She quit smokeless tobacco use about 2 years ago. Per reports that she drinks alcohol. She reports that she does not use illicit drugs   Allergies: No Known Allergies  Medications Prior to Admission  Medication Sig Dispense Refill  . acetaminophen (TYLENOL) 500 MG tablet Take 500 mg by mouth every 6 (six) hours as needed for mild pain.      .Marland KitchenamLODipine (NORVASC) 10 MG tablet Take 1 tablet (10 mg total) by mouth daily.  90 tablet  1  . atorvastatin (LIPITOR) 40 MG tablet Take 40 mg by mouth daily at 6 PM.      . clindamycin (CLEOCIN) 300 MG capsule Take 1 capsule (300 mg total) by mouth 3 (three) times daily.  9 capsule  0  . docusate sodium 100 MG CAPS Take 100  mg by mouth 2 (two) times daily.  30 capsule  0  . DULoxetine (CYMBALTA) 30 MG capsule Take 1 capsule (30 mg total) by mouth daily.  30 capsule  3  . HYDROcodone-acetaminophen (NORCO) 7.5-325 MG per tablet Take 1 tablet by mouth every 6 (six) hours as needed for moderate pain or severe pain.  30 tablet  0  . metoprolol tartrate (LOPRESSOR) 25 MG tablet Take 1 tablet (25 mg total) by mouth 2 (two) times daily.  60 tablet  11  . oxyCODONE-acetaminophen (PERCOCET) 10-325 MG per tablet Take 1 tablet by mouth every 6 (six) hours as needed for pain.  30 tablet  0  . rivaroxaban (XARELTO) 20 MG TABS tablet Take 1 tablet (20 mg total) by mouth daily with supper.  30 tablet  1     Home: Home Living Family/patient expects to be discharged to:: Private residence Living Arrangements: Children Available Help at Discharge: Family;Friend(s) Type of Home: House Home Access: Stairs to enter [employer] of Steps: 3-4 Entrance Stairs-Rails: None Home Layout: Two level;Able to live on main level with bedroom/bathroom Home Equipment: Walker - 2 wheels Additional Comments: no family present to confirm above, information gathered from previous admission; patient very [place] thus difficult to communicate   Functional History: Prior Function Comments: unable to detemine; patient states she was independent  Functional Status:  Mobility: Bed Mobility Overal bed mobility: Needs Assistance Bed Mobility: Sit to Supine Supine to sit: Min assist Sit to supine: Min assist General bed mobility comments: Min (A) to assist LEs onto bed. Pt able to push throug Bil UEs and RLE to scoot herself up in bed Transfers Overall transfer level: Needs assistance Equipment used: Rolling walker (2 wheeled) Transfers: Sit to/from [facility] Sit to Stand: Min assist;+2 safety/equipment Stand pivot transfers: Min assist;+2 safety/equipment General transfer comment: Min (A) to power up and to stabilize RW. +2 for safety. Pt unable to clear RLE off floor and instead pushes through RW and "walks" her foot across the floor      ADL: ADL Overall ADL's : Needs assistance/impaired Eating/Feeding: Modified independent;Sitting Grooming: Modified independent;Sitting Upper Body Bathing: Minimal assitance;Sitting Lower Body Bathing: Sit to/from stand;+2 for safety/equipment;Maximal assistance Upper Body Dressing : Minimal assistance;Sitting Lower Body Dressing: Total assistance;+2 for safety/equipment;Sit to/from stand Toilet Transfer: Minimal assistance;+2 for safety/equipment;Stand-pivot;RW (recliner > bed) General ADL Comments: Pt limited by pain and residual cognitive  deficits. Pt demonstrates slow processing and difficulty problem solving, however is able to manage tasks with few VC's.   Cognition: Cognition Overall Cognitive Status: No family/caregiver present to determine baseline cognitive functioning Orientation Level: Oriented to person;Oriented to place;Oriented to time;Oriented to situation Cognition Arousal/Alertness: Awake/alert Behavior During Therapy: WFL for tasks assessed/performed Overall Cognitive Status: No family/caregiver present to determine baseline cognitive functioning Area of Impairment: Following commands;Safety/judgement;Problem solving Following Commands: Follows one step commands with increased time Safety/Judgement: Decreased awareness of safety Problem Solving: Slow processing;Requires verbal cues;Requires tactile cues General Comments: Pt responding appropriately, however difficult to communicate due to [facility]. Pt slow to respond and demonstrates some difficulty with following commands (although this is likely related to hearing impairment).   Difficult to assess due to: Hard of hearing/deaf  Physical Exam: Blood pressure 147/73, pulse 92, temperature 97.4 F (36.3 C), temperature source Oral, resp. rate 16, height _0  (1.6 m), weight 78.4 kg (172 lb 13.5 oz), SpO2 97.00%. Physical Exam Nursing note and vitals reviewed.  Constitutional: She appears well-developed and well-nourished.  HENT:  Head: Normocephalic  and atraumatic.  Eyes: Conjunctivae are normal. Pupils are equal, round, and reactive to light.  Neck: Normal range of motion. Neck supple.  Cardiovascular: Normal rate and regular rhythm.  Respiratory: Effort normal and breath sounds normal.  GI: Soft. Bowel sounds are normal.  Musculoskeletal:  No edema BUE/RLE. LLE with substantial edema still. Limb substantially tender.  Neurological: She is alert.  Calmer and appropriate. Was able to follow simple commands with gestures. Hearing poor. Able read written  information without issue. UE's 4 to 4+/5 prox to distal.  RLE: 4/5 prox to distal. LLE: HF 2+ with pain inhibiting Skin: Skin is warm and dry.  L-AKA with dry dressing. Incision intact with staples in place and minimal drainage.  Psychiatric: She has a normal mood and affect. Her behavior is normal.    Results for orders placed during the hospital encounter of 04/30/14 (from the past 48 hour(s))  BASIC METABOLIC PANEL     Status: Abnormal   Collection Time    05/07/14  6:21 AM      Result Value Ref Range   Sodium 137  137 - 147 mEq/L   Potassium 3.8  3.7 - 5.3 mEq/L   Chloride 100  96 - 112 mEq/L   CO2 23  19 - 32 mEq/L   Glucose, Bld 111 (*) 70 - 99 mg/dL   BUN 7  6 - 23 mg/dL   Creatinine, Ser 0.90  0.50 - 1.10 mg/dL   Calcium 8.9  8.4 - 10.5 mg/dL   GFR calc non Af Amer 67 (*) >90 mL/min   GFR calc Af Amer 77 (*) >90 mL/min   Comment: (NOTE)     The eGFR has been calculated using the CKD EPI equation.     This calculation has not been validated in all clinical situations.     eGFR's persistently <90 mL/min signify possible Chronic Kidney     Disease.   Anion gap 14  5 - 15  MAGNESIUM     Status: None   Collection Time    05/07/14  6:21 AM      Result Value Ref Range   Magnesium 1.6  1.5 - 2.5 mg/dL  HEPARIN LEVEL (UNFRACTIONATED)     Status: None   Collection Time    05/07/14  6:24 AM      Result Value Ref Range   Heparin Unfractionated 0.50  0.30 - 0.70 IU/mL   Comment:            IF HEPARIN RESULTS ARE BELOW     EXPECTED VALUES, AND PATIENT     DOSAGE HAS BEEN CONFIRMED,     SUGGEST FOLLOW UP TESTING     OF ANTITHROMBIN III LEVELS.   No results found.     Medical Problem List and Plan: 1. 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 2.  LLE DVT/Anticoagulation: Pharmaceutical: Xarelto 3. Pain Management: Will start patient on OxyContin 10 mg bid and continue oxycodone prn.  -observe mental status closely  4. Mood: Continue cymbalta for pain as well as  mood stabilization. Team to provide ego support and encouragement to help with participation. LCSW to follow for evaluation and support.  5. Neuropsych: This patient is not capable of making decisions on her own behalf. 6. Skin/Wound Care: Monitor wound daily for healing. 7. Fluids/Electrolytes/Nutrition: Monitor intake for adequate hydration and nutritional status.  8. HTN: Will monitor every 8 hours. Continue Coreg and Altace.  9. CAD: Monitor for symptoms with recommendations to  add nitrate if needed. Continue coreg bid, Altace and Lipitor.  10. Recent L-MCA infarct: on Xarelto. 11. Hypokalemia: resolved past supplementation. Will recheck in am.  12. Leucocytosis: Resolving. Monitor for signs of infection. Monitor temperature curve. Off antibiotics. Recheck in am.  13. Acute blood loss anemia: Will check CBC for follow up in am.    Post Admission Physician Evaluation: 1. Functional deficits secondary  to left AKA due to PAD and ulceration of LLE 2. Patient is admitted to receive collaborative, interdisciplinary care between the physiatrist, rehab nursing staff, and therapy team. 3. Patient's level of medical complexity and substantial therapy needs in context of that medical necessity cannot be provided at a lesser intensity of care such as a SNF. 4. Patient has experienced substantial functional loss from his/her baseline which was documented above under the "Functional History" and "Functional Status" headings.  Judging by the patient's diagnosis, physical exam, and functional history, the patient has potential for functional progress which will result in measurable gains while on inpatient rehab.  These gains will be of substantial and practical use upon discharge  in facilitating mobility and self-care at the household level. 5. Physiatrist will provide 24 hour management of medical needs as well as oversight of the therapy plan/treatment and provide guidance as appropriate regarding the  interaction of the two. 6. 24 hour rehab nursing will assist with bladder management, bowel management, safety, skin/wound care, disease management, medication administration, pain management and patient education  and help integrate therapy concepts, techniques,education, etc. 7. PT will assess and treat for/with: Lower extremity strength, range of motion, stamina, balance, functional mobility, safety, adaptive techniques and equipment, pain mgt, pre-prosthetic education, ego support, wound care.   Goals are: mod I to supervision.  This patient requires special attention due to profound hearing loss. 8. OT will assess and treat for/with: ADL's, functional mobility, safety, upper extremity strength, adaptive techniques and equipment, pain mgt, ego support, leisure awareness, community reintegration.   Goals are: mod I to supervision. Therapy may not yet proceed with showering this patient.  This patient needs special attention due to profound hearing loss.  9. SLP will assess and treat for/with: n/a.  Goals are: n/a. 10. Case Management and Social Worker will assess and treat for psychological issues and discharge planning. 11. Team conference will be held weekly to assess progress toward goals and to determine barriers to discharge. 12. Patient will receive at least 3 hours of therapy per day at least 5 days per week. 13. ELOS: 10-14 days       14. Prognosis:  good     Meredith Staggers, MD, [place] [facility] 05/08/2014   05/08/2014

## 2014-05-08 NOTE — PMR Pre-admission (Signed)
PMR Admission Coordinator Pre-Admission Assessment  Patient: Traci Mclaughlin is an 63 y.o., female MRN: [id number] DOB: Apr 09, 1951 Height: 5' 3"  (160 cm) Weight: 78.4 kg (172 lb 13.5 oz)              Insurance Information HMO: Yes    PPO:       PCP:       IPA:       80/20:       OTHER: Group # [id number] PRIMARY: [employer] Medicare Complete      Policy#: [id number]      Subscriber: [place] Name: Gaylan Gerold      Phone#: [phone]     Fax#:   Pre-Cert#: [id number]      Employer: Retired Benefits:  Phone #: [phone]     Name: Rolla Flatten. Date: 07/27/13     Deduct:  $0      Out of Pocket Max: [phone] (met [phone])      Life Max: none CIR: $430 days 1-4      SNF: $0 days 1-20; $155 days 21-64; $0 days 65-100 Outpatient: w/medical necessity     Co-Pay: $40/visit each therapy Home Health: 100%      Co-Pay: none DME: 80%     Co-Pay: 20% Providers: in network only  Emergency Facilities manager Information   Name Relation Home Work Mobile   [place] Daughter [place] [phone]       Current Medical History  Patient Admitting Diagnosis:  L AKA  History of Present Illness: A 63 y.o. 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 Patient well known to CIR from prior stay. She developed left toe ulcers as well as left knee wound with infection and worsening of pain. She was admitted on 04/30/14 for pain control as well as IV antibiotics for treatment of infection. 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 She was cleared for surgery by Dr. Burt Knack and underwent L-AKA by Dr. Scot Dock on 05/04/14. PT/OT evaluations done and CIR recommended for follow up therapy. She is limited by anxiety, lability, communicative deficits (does not use  sign language/ minimal ability to read lips) as well as pain.     Total: 4=NIH  Past Medical History  Past Medical History  Diagnosis Date  . Acute kidney injury 07/06/2011  . Hypertension   . HOH (hard of hearing)   . CVA (cerebral infarction)   . PAD (peripheral artery disease) 04/10/2014  . Cancer     cervical; was on chemo; has been in remission for 72yr  . Depression 04/19/2014  . Hx of cardiovascular stress test     Nuclear (9/15):  Inf ischemia, EF 35%; high risk  . Stroke Aug. 10, 2015  . STEMI (ST elevation myocardial infarction) 06/2011  . RBBB (right bundle branch block) 05/02/2014    Family History  family history includes Alcohol abuse in her mother; COPD in her father; Diabetes in her mother; Intracerebral hemorrhage in her daughter; Lung cancer in her father; Stroke in her daughter. There is no history of Heart attack.  Prior Rehab/Hospitalizations:  [facility]15[facility]  [facility]   Current Medications  Current facility-administered medications:acetaminophen (TYLENOL) suppository 325-650 mg, 325-650 mg, Rectal, Q4H PRN, Samantha J Rhyne, PA-C;  acetaminophen (TYLENOL)  tablet 325-650 mg, 325-650 mg, Oral, Q4H PRN, Samantha J Rhyne, PA-C;  alum & mag hydroxide-simeth (MAALOX/MYLANTA) 200-200-20 MG/5ML suspension 15-30 mL, 15-30 mL, Oral, Q2H PRN, Samantha J Rhyne, PA-C aspirin EC tablet 81 mg, 81 mg, Oral, Daily, Carlena Bjornstad, MD, 81 mg at 05/08/14 1021;  atorvastatin (LIPITOR) tablet 40 mg, 40 mg, Oral, q1800, [name], MD, 40 mg at 05/07/14 1741;  carvedilol (COREG) tablet 18.75 mg, 18.75 mg, Oral, BID WC, Rhonda G Barrett, PA-C, 18.75 mg at 05/08/14 0839;  docusate sodium (COLACE) capsule 100 mg, 100 mg, Oral, Daily, Samantha J Rhyne, PA-C, 100 mg at 05/08/14 1021 DULoxetine (CYMBALTA) DR capsule 30 mg, 30 mg, Oral, Daily, [name], MD, 30 mg at 05/08/14 1021;  guaiFENesin-dextromethorphan (ROBITUSSIN DM) 100-10 MG/5ML syrup 15 mL, 15  mL, Oral, Q4H PRN, Samantha J Rhyne, PA-C;  hydrALAZINE (APRESOLINE) injection 10 mg, 10 mg, Intravenous, Q4H PRN, Modena Jansky, MD;  morphine 2 MG/ML injection 2 mg, 2 mg, Intravenous, Q4H PRN, Modena Jansky, MD, 2 mg at 05/07/14 0418 ondansetron [facility]) injection 4 mg, 4 mg, Intravenous, Q6H PRN, Hulen Shouts Rhyne, PA-C, 4 mg at 05/04/14 2039;  oxyCODONE (Oxy IR/ROXICODONE) immediate release tablet 5 mg, 5 mg, Oral, Q6H PRN, [name], MD, 5 mg at 05/08/14 1029;  oxyCODONE-acetaminophen (PERCOCET/ROXICET) 5-325 MG per tablet 1 tablet, 1 tablet, Oral, Q6H PRN, [name], MD, 1 tablet at 05/08/14 1029 pantoprazole (PROTONIX) EC tablet 40 mg, 40 mg, Oral, Daily, Samantha J Rhyne, PA-C, 40 mg at 05/08/14 1021;  phenol (CHLORASEPTIC) mouth spray 1 spray, 1 spray, Mouth/Throat, PRN, Samantha J Rhyne, PA-C;  ramipril (ALTACE) capsule 5 mg, 5 mg, Oral, Daily, Modena Jansky, MD, 5 mg at 05/08/14 1021;  rivaroxaban (XARELTO) tablet 20 mg, 20 mg, Oral, Q supper, Modena Jansky, MD, 20 mg at 05/07/14 1318 sodium chloride 0.9 % injection 3 mL, 3 mL, Intravenous, Q12H, [name], MD, 3 mL at 05/08/14 1155;  sodium chloride 0.9 % injection 3 mL, 3 mL, Intravenous, PRN, [name], MD, 3 mL at 05/05/14 1454;  white petrolatum (VASELINE) gel, , Topical, PRN, Modena Jansky, MD  Patients Current Diet: Cardiac  Precautions / Restrictions Precautions Precautions: Fall Precaution Comments: LLE AKA Restrictions Weight Bearing Restrictions: Yes LLE Weight Bearing: Non weight bearing   Prior Activity Level Limited Community (1-2x/wk): Went out to appointment 1-2 X a week per daughter.  Home Assistive Devices / Equipment Home Assistive Devices/Equipment: Environmental consultant (specify type) Home Equipment: Walker - 2 wheels  Prior Functional Level Prior Function Comments: unable to detemine; patient states she was independent  Current Functional Level Cognition  Overall Cognitive Status: No  family/caregiver present to determine baseline cognitive functioning Difficult to assess due to: Hard of hearing/deaf Orientation Level: Oriented to person;Oriented to place;Oriented to time;Oriented to situation Following Commands: Follows one step commands with increased time (with written and visual instruction) Safety/Judgement: Decreased awareness of safety General Comments: Pt responding appropriately, however difficult to communicate due to [facility] and impaired speech. Some difficulty with commands even with written and visual instruction simultaneously.    Extremity Assessment (includes Sensation/Coordination)  Upper Extremity Assessment: Overall WFL for tasks assessed  Lower Extremity Assessment: Overall WFL for tasks assessed    ADLs  Overall ADL's : Needs assistance/impaired Eating/Feeding: Modified independent;Sitting Grooming: Set up;Sitting;Wash/dry hands;Wash/dry face;Oral care Upper Body Bathing: Minimal assitance;Sitting Lower Body Bathing: Sit to/from stand;+2 for safety/equipment;Maximal assistance Upper Body Dressing : Set up;Sitting Lower Body Dressing: Total assistance;+2 for safety/equipment;Sit to/from  stand Toilet Transfer: Minimal assistance;Stand-pivot;BSC;RW Toileting- Clothing Manipulation and Hygiene: Total assistance;Sit to/from stand General ADL Comments: transferred x 2 to R side, encouragement needed to stay up in chair    Mobility  Overal bed mobility: Needs Assistance Bed Mobility: Supine to Sit Supine to sit: Min guard Sit to supine: Min assist General bed mobility comments: pt in recliner on arrival    Transfers  Overall transfer level: Needs assistance Equipment used: Rolling walker (2 wheeled) Transfers: Stand Pivot Transfers Sit to Stand: Min assist Stand pivot transfers: Min assist General transfer comment: min assist with cues for hand placement, sequence to scoot to edge of chair and not following cues to reach back for chair to sit.      Ambulation / Gait / Stairs / Scientist, research (physical sciences) / Balance Sitting-balance support: No upper extremity supported  Sitting balance-Leahy Scale: Good   Special needs/care consideration BiPAP/CPAP No CPM No Continuous Drip IV No Dialysis No         Life Vest No Oxygen No Special Bed No Trach Size No Wound Vac (area) No      Skin  Has new L AKA incision                             Bowel mgmt: Last BM 05/07/14 Bladder mgmt: Incontinent at times, catheter removed on 05/07/14 Diabetic mgmt No    Previous Home Environment Living Arrangements: Children Available Help at Discharge: Family;Friend(s) Type of Home: House Home Layout: Two level;Able to live on main level with bedroom/bathroom Home Access: Stairs to enter Entrance Stairs-Rails: None Entrance Stairs-Number of Steps: 3-4 Bathroom Shower/Tub: Chiropodist: Standard Home Care Services: Yes Type of Home Care Services: Home RN;Other (Comment) (Speech) [place] (if known): Advanced Additional Comments: no family present to confirm above, information gathered from previous admission; patient very [place] thus difficult to communicate  Discharge Living Setting Plans for Discharge Living Setting: House;Lives with (comment) (Lives with daughter, 4 grandchildren and a great grandson.) Type of Home at Discharge: House Discharge Home Layout: Two level;Laundry or work area in basement;Able to live on main level with bedroom/bathroom Alternate Therapist, sports of Steps: Flight to basement, but does not have to go down to basement. Discharge Home Access: Stairs to enter Entrance Stairs-Number of Steps: 3 steps to side and to front entrance. Does the patient have any problems obtaining your medications?: No  Social/Family/Support Systems Patient Roles: Parent (Has daughters and grandchildren.) Contact Information: Philip Aspen - daughter Anticipated Caregiver: daughter Anticipated  Caregiver's Contact Information: [name] [phone] Ability/Limitations of Caregiver: Has daughter and grandchildren and great grandchildren Caregiver Availability: 24/7 Discharge Plan Discussed with Primary Caregiver: Yes Is Caregiver In Agreement with Plan?: Yes Does Caregiver/Family have Issues with Lodging/Transportation while Pt is in Rehab?: No  Goals/Additional Needs Patient/Family Goal for Rehab: PT/OT mod I/S goals Expected length of stay: 7-10 days Cultural Considerations: No pork products per daughter Dietary Needs: Heart diet, thin liquids Equipment Needs: TBD Pt/Family Agrees to Admission and willing to participate: Yes Program Orientation Provided & Reviewed with Pt/Caregiver Including Roles  & Responsibilities: Yes  Decrease burden of Care through IP rehab admission: N/A  Possible need for SNF placement upon discharge: Not planned  Patient Condition: This patient's condition remains as documented in the consult dated 05/07/14, in which the Rehabilitation Physician determined and documented that the patient's condition is appropriate for intensive rehabilitative care in  an inpatient rehabilitation facility. Will admit to inpatient rehab today.  Preadmission Screen Completed By:  Retta Diones, 05/08/2014 2:41 PM ______________________________________________________________________   Discussed status with Dr. Naaman Plummer on 05/08/14 at 37 and received telephone approval for admission today.  Admission Coordinator:  Retta Diones, time1440/Date10/13/15

## 2014-05-08 NOTE — H&P (View-Only) (Signed)
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 Patient well known to CIR from prior stay. She developed left toe ulcers as well as left knee wound with infection and worsening of pain. She was admitted on 04/30/14 for pain control as well as IV antibiotics for treatment of infection. 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 She was cleared for surgery by Dr. Burt Knack and underwent L-AKA by Dr. Scot Dock on 05/04/14. PT/OT evaluations done and CIR recommended for follow up therapy. She is limited by anxiety, lability, communicative deficits (does not use sign language/ minimal ability to read lips) as well as pain.     Review of Systems  Unable to perform ROS: other    Past Medical History  Diagnosis Date  . Acute kidney injury 07/06/2011  . Hypertension   . HOH (hard of hearing)   . CVA (cerebral infarction)   . PAD (peripheral artery disease) 04/10/2014  . Cancer     cervical; was on chemo; has been in remission for 55yr  . Depression 04/19/2014  . Hx of cardiovascular stress test     Nuclear (9/15):  Inf ischemia, EF 35%; high risk  . Stroke Aug. 10, 2015  . STEMI (ST elevation myocardial infarction) 06/2011  . RBBB (right bundle branch block) 05/02/2014    Past Surgical History  Procedure Laterality Date  . Cardiac catheterization  07/03/2011    Dr. VIrish Lack . Tee without cardioversion N/A 03/08/2014    Procedure: TRANSESOPHAGEAL ECHOCARDIOGRAM (TEE);  Surgeon: PFay Records MD;  [facility];  Service: Cardiovascular;  Laterality: N/A;  . Loop recorder implant  03-12-2014    MDT LINQ implanted by Dr ARayann Hemanfor cryptogenic  stroke  . Amputation Left 05/04/2014    Procedure: AMPUTATION ABOVE KNEE;  Surgeon: CAngelia Mould MD;  [facility];  Service: Vascular;  Laterality: Left;    Family History  Problem Relation Age of Onset  . Intracerebral hemorrhage Daughter     during childbirth  . Stroke Daughter   . Heart attack Neg Hx   . Diabetes Mother   . Alcohol abuse Mother   . Lung cancer Father   . COPD Father     Social History: Lives with family. Per reports that she has quit smoking. Her smoking use included Cigarettes. She has a 1 pack-year smoking history. She quit smokeless tobacco use about 2 years ago. Per reports that she drinks alcohol. She reports that she does not use illicit drugs   Allergies: No Known Allergies  Medications Prior to Admission  Medication Sig Dispense Refill  . acetaminophen (TYLENOL) 500 MG tablet Take 500 mg by mouth every 6 (six) hours as needed for mild pain.      .Marland KitchenamLODipine (NORVASC) 10 MG tablet Take 1 tablet (10 mg total) by mouth daily.  90 tablet  1  . atorvastatin (LIPITOR) 40 MG tablet Take 40 mg by mouth daily at 6 PM.      . clindamycin (CLEOCIN) 300 MG capsule Take 1 capsule (300 mg total) by mouth 3 (three) times daily.  9 capsule  0  . docusate sodium 100 MG CAPS Take 100  mg by mouth 2 (two) times daily.  30 capsule  0  . DULoxetine (CYMBALTA) 30 MG capsule Take 1 capsule (30 mg total) by mouth daily.  30 capsule  3  . HYDROcodone-acetaminophen (NORCO) 7.5-325 MG per tablet Take 1 tablet by mouth every 6 (six) hours as needed for moderate pain or severe pain.  30 tablet  0  . metoprolol tartrate (LOPRESSOR) 25 MG tablet Take 1 tablet (25 mg total) by mouth 2 (two) times daily.  60 tablet  11  . oxyCODONE-acetaminophen (PERCOCET) 10-325 MG per tablet Take 1 tablet by mouth every 6 (six) hours as needed for pain.  30 tablet  0  . rivaroxaban (XARELTO) 20 MG TABS tablet Take 1 tablet (20 mg total) by mouth daily with supper.  30 tablet  1     Home: Home Living Family/patient expects to be discharged to:: Private residence Living Arrangements: Children Available Help at Discharge: Family;Friend(s) Type of Home: House Home Access: Stairs to enter [employer] of Steps: 3-4 Entrance Stairs-Rails: None Home Layout: Two level;Able to live on main level with bedroom/bathroom Home Equipment: Walker - 2 wheels Additional Comments: no family present to confirm above, information gathered from previous admission; patient very [place] thus difficult to communicate   Functional History: Prior Function Comments: unable to detemine; patient states she was independent  Functional Status:  Mobility: Bed Mobility Overal bed mobility: Needs Assistance Bed Mobility: Sit to Supine Supine to sit: Min assist Sit to supine: Min assist General bed mobility comments: Min (A) to assist LEs onto bed. Pt able to push throug Bil UEs and RLE to scoot herself up in bed Transfers Overall transfer level: Needs assistance Equipment used: Rolling walker (2 wheeled) Transfers: Sit to/from [employer] Sit to Stand: Min assist;+2 safety/equipment Stand pivot transfers: Min assist;+2 safety/equipment General transfer comment: Min (A) to power up and to stabilize RW. +2 for safety. Pt unable to clear RLE off floor and instead pushes through RW and "walks" her foot across the floor      ADL: ADL Overall ADL's : Needs assistance/impaired Eating/Feeding: Modified independent;Sitting Grooming: Modified independent;Sitting Upper Body Bathing: Minimal assitance;Sitting Lower Body Bathing: Sit to/from stand;+2 for safety/equipment;Maximal assistance Upper Body Dressing : Minimal assistance;Sitting Lower Body Dressing: Total assistance;+2 for safety/equipment;Sit to/from stand Toilet Transfer: Minimal assistance;+2 for safety/equipment;Stand-pivot;RW (recliner > bed) General ADL Comments: Pt limited by pain and residual cognitive  deficits. Pt demonstrates slow processing and difficulty problem solving, however is able to manage tasks with few VC's.   Cognition: Cognition Overall Cognitive Status: No family/caregiver present to determine baseline cognitive functioning Orientation Level: Oriented to person;Oriented to place;Oriented to time;Oriented to situation Cognition Arousal/Alertness: Awake/alert Behavior During Therapy: WFL for tasks assessed/performed Overall Cognitive Status: No family/caregiver present to determine baseline cognitive functioning Area of Impairment: Following commands;Safety/judgement;Problem solving Following Commands: Follows one step commands with increased time Safety/Judgement: Decreased awareness of safety Problem Solving: Slow processing;Requires verbal cues;Requires tactile cues General Comments: Pt responding appropriately, however difficult to communicate due to [facility]. Pt slow to respond and demonstrates some difficulty with following commands (although this is likely related to hearing impairment).   Difficult to assess due to: Hard of hearing/deaf  Physical Exam: Blood pressure 147/73, pulse 92, temperature 97.4 F (36.3 C), temperature source Oral, resp. rate 16, height _0  (1.6 m), weight 78.4 kg (172 lb 13.5 oz), SpO2 97.00%. Physical Exam Nursing note and vitals reviewed.  Constitutional: She appears well-developed and well-nourished.  HENT:  Head: Normocephalic  and atraumatic.  Eyes: Conjunctivae are normal. Pupils are equal, round, and reactive to light.  Neck: Normal range of motion. Neck supple.  Cardiovascular: Normal rate and regular rhythm.  Respiratory: Effort normal and breath sounds normal.  GI: Soft. Bowel sounds are normal.  Musculoskeletal:  No edema BUE/RLE. LLE with substantial edema still. Limb substantially tender.  Neurological: She is alert.  Calmer and appropriate. Was able to follow simple commands with gestures. Hearing poor. Able read written  information without issue. UE's 4 to 4+/5 prox to distal.  RLE: 4/5 prox to distal. LLE: HF 2+ with pain inhibiting Skin: Skin is warm and dry.  L-AKA with dry dressing. Incision intact with staples in place and minimal drainage.  Psychiatric: She has a normal mood and affect. Her behavior is normal.    Results for orders placed during the hospital encounter of 04/30/14 (from the past 48 hour(s))  BASIC METABOLIC PANEL     Status: Abnormal   Collection Time    05/07/14  6:21 AM      Result Value Ref Range   Sodium 137  137 - 147 mEq/L   Potassium 3.8  3.7 - 5.3 mEq/L   Chloride 100  96 - 112 mEq/L   CO2 23  19 - 32 mEq/L   Glucose, Bld 111 (*) 70 - 99 mg/dL   BUN 7  6 - 23 mg/dL   Creatinine, Ser 0.90  0.50 - 1.10 mg/dL   Calcium 8.9  8.4 - 10.5 mg/dL   GFR calc non Af Amer 67 (*) >90 mL/min   GFR calc Af Amer 77 (*) >90 mL/min   Comment: (NOTE)     The eGFR has been calculated using the CKD EPI equation.     This calculation has not been validated in all clinical situations.     eGFR's persistently <90 mL/min signify possible Chronic Kidney     Disease.   Anion gap 14  5 - 15  MAGNESIUM     Status: None   Collection Time    05/07/14  6:21 AM      Result Value Ref Range   Magnesium 1.6  1.5 - 2.5 mg/dL  HEPARIN LEVEL (UNFRACTIONATED)     Status: None   Collection Time    05/07/14  6:24 AM      Result Value Ref Range   Heparin Unfractionated 0.50  0.30 - 0.70 IU/mL   Comment:            IF HEPARIN RESULTS ARE BELOW     EXPECTED VALUES, AND PATIENT     DOSAGE HAS BEEN CONFIRMED,     SUGGEST FOLLOW UP TESTING     OF ANTITHROMBIN III LEVELS.   No results found.     Medical Problem List and Plan: 1. 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 2.  LLE DVT/Anticoagulation: Pharmaceutical: Xarelto 3. Pain Management: Will start patient on OxyContin 10 mg bid and continue oxycodone prn.  -observe mental status closely  4. Mood: Continue cymbalta for pain as well as  mood stabilization. Team to provide ego support and encouragement to help with participation. LCSW to follow for evaluation and support.  5. Neuropsych: This patient is not capable of making decisions on her own behalf. 6. Skin/Wound Care: Monitor wound daily for healing. 7. Fluids/Electrolytes/Nutrition: Monitor intake for adequate hydration and nutritional status.  8. HTN: Will monitor every 8 hours. Continue Coreg and Altace.  9. CAD: Monitor for symptoms with recommendations to  add nitrate if needed. Continue coreg bid, Altace and Lipitor.  10. Recent L-MCA infarct: on Xarelto. 11. Hypokalemia: resolved past supplementation. Will recheck in am.  12. Leucocytosis: Resolving. Monitor for signs of infection. Monitor temperature curve. Off antibiotics. Recheck in am.  13. Acute blood loss anemia: Will check CBC for follow up in am.    Post Admission Physician Evaluation: 1. Functional deficits secondary  to left AKA due to PAD and ulceration of LLE 2. Patient is admitted to receive collaborative, interdisciplinary care between the physiatrist, rehab nursing staff, and therapy team. 3. Patient's level of medical complexity and substantial therapy needs in context of that medical necessity cannot be provided at a lesser intensity of care such as a SNF. 4. Patient has experienced substantial functional loss from his/her baseline which was documented above under the "Functional History" and "Functional Status" headings.  Judging by the patient's diagnosis, physical exam, and functional history, the patient has potential for functional progress which will result in measurable gains while on inpatient rehab.  These gains will be of substantial and practical use upon discharge  in facilitating mobility and self-care at the household level. 5. Physiatrist will provide 24 hour management of medical needs as well as oversight of the therapy plan/treatment and provide guidance as appropriate regarding the  interaction of the two. 6. 24 hour rehab nursing will assist with bladder management, bowel management, safety, skin/wound care, disease management, medication administration, pain management and patient education  and help integrate therapy concepts, techniques,education, etc. 7. PT will assess and treat for/with: Lower extremity strength, range of motion, stamina, balance, functional mobility, safety, adaptive techniques and equipment, pain mgt, pre-prosthetic education, ego support, wound care.   Goals are: mod I to supervision.  This patient requires special attention due to profound hearing loss. 8. OT will assess and treat for/with: ADL's, functional mobility, safety, upper extremity strength, adaptive techniques and equipment, pain mgt, ego support, leisure awareness, community reintegration.   Goals are: mod I to supervision. Therapy may not yet proceed with showering this patient.  This patient needs special attention due to profound hearing loss.  9. SLP will assess and treat for/with: n/a.  Goals are: n/a. 10. Case Management and Social Worker will assess and treat for psychological issues and discharge planning. 11. Team conference will be held weekly to assess progress toward goals and to determine barriers to discharge. 12. Patient will receive at least 3 hours of therapy per day at least 5 days per week. 13. ELOS: 10-14 days       14. Prognosis:  good     Meredith Staggers, MD, [place] [facility] 05/08/2014   05/08/2014

## 2014-05-08 NOTE — Progress Notes (Signed)
Occupational Therapy Treatment Patient Details Name: Rodnesha Elie MRN: [id number] DOB: 05/23/1951 Today's Date: 05/08/2014    History of present illness Patient admitted with h/o LLE DVT, PAD. AKA performed on 10-9/15. PMH pertinent for CVA 8/15. Patient also extremely HOH.    OT comments  Pt progressing steadily with ADL transfers and seated grooming and UB dressing.  Pt tearful with pain after transfer to 3 in 1 and then to chair.  Requires multi-modal cues for safety and technique due to cognition and hearing impairment.  Follow Up Recommendations  CIR;Supervision/Assistance - 24 hour    Equipment Recommendations       Recommendations for Other Services      Precautions / Restrictions Precautions Precautions: Fall Precaution Comments: LLE AKA         Mobility Bed Mobility Overal bed mobility: Needs Assistance Bed Mobility: Supine to Sit     Supine to sit: Min guard     General bed mobility comments: requires extra time, HOB up, use of rail, but no physical assist  Transfers     Transfers: Stand Pivot Transfers Sit to Stand: Min assist Stand pivot transfers: Min assist       General transfer comment: min assist to power up and gain balance with walker, cues for hand placement and posture    Balance                                   ADL Overall ADL's : Needs assistance/impaired     Grooming: Set up;Sitting;Wash/dry hands;Wash/dry face;Oral care           Upper Body Dressing : Set up;Sitting       Toilet Transfer: Minimal assistance;Stand-pivot;BSC;RW   Toileting- Clothing Manipulation and Hygiene: Total assistance;Sit to/from stand         General ADL Comments: transferred x 2 to R side, encouragement needed to stay up in chair      Vision                     Perception     Praxis      Cognition   Behavior During Therapy:  (tearful) Overall Cognitive Status: No family/caregiver present to determine  baseline cognitive functioning Area of Impairment: Following commands;Safety/judgement;Problem solving        Following Commands: Follows one step commands with increased time (and multimodal cues)     Problem Solving: Slow processing;Difficulty sequencing;Requires verbal cues;Requires tactile cues      Extremity/Trunk Assessment               Exercises     Shoulder Instructions       General Comments      Pertinent Vitals/ Pain       Pain Assessment: Faces Faces Pain Scale: Hurts whole lot Pain Location: L LE Pain Intervention(s): Limited activity within patient's tolerance;Monitored during session;Patient requesting pain meds-RN notified;Repositioned  Home Living                                          Prior Functioning/Environment              Frequency Min 2X/week     Progress Toward Goals  OT Goals(current goals can now be found in the care plan section)  Progress towards OT goals: Progressing toward goals  Acute Rehab OT Goals Time For Goal Achievement: 05/20/14 Potential to Achieve Goals: Good  Plan Discharge plan remains appropriate    Co-evaluation                 End of Session Equipment Utilized During Treatment: Gait belt;Rolling walker   Activity Tolerance Patient limited by pain   Patient Left in chair;with call bell/phone within reach;with nursing/sitter in room   Nurse Communication Patient requests pain meds        Time: 4196-2229 OT Time Calculation (min): 34 min  Charges: OT General Charges $OT Visit: 1 Procedure OT Treatments $Self Care/Home Management : 23-37 mins  Malka So 05/08/2014, 10:37 AM [phone]

## 2014-05-09 ENCOUNTER — Inpatient Hospital Stay (HOSPITAL_COMMUNITY): Payer: Medicare Other | Admitting: Occupational Therapy

## 2014-05-09 ENCOUNTER — Ambulatory Visit: Payer: Medicare Other | Admitting: Vascular Surgery

## 2014-05-09 ENCOUNTER — Inpatient Hospital Stay (HOSPITAL_COMMUNITY): Payer: Medicare Other | Admitting: Physical Therapy

## 2014-05-09 DIAGNOSIS — I739 Peripheral vascular disease, unspecified: Secondary | ICD-10-CM

## 2014-05-09 DIAGNOSIS — G8190 Hemiplegia, unspecified affecting unspecified side: Secondary | ICD-10-CM

## 2014-05-09 LAB — COMPREHENSIVE METABOLIC PANEL
ALT: 40 U/L — ABNORMAL HIGH (ref 0–35)
AST: 39 U/L — AB (ref 0–37)
Albumin: 2.3 g/dL — ABNORMAL LOW (ref 3.5–5.2)
Alkaline Phosphatase: 55 U/L (ref 39–117)
Anion gap: 14 (ref 5–15)
BILIRUBIN TOTAL: 0.3 mg/dL (ref 0.3–1.2)
BUN: 10 mg/dL (ref 6–23)
CALCIUM: 9.4 mg/dL (ref 8.4–10.5)
CHLORIDE: 101 meq/L (ref 96–112)
CO2: 26 mEq/L (ref 19–32)
CREATININE: 0.95 mg/dL (ref 0.50–1.10)
GFR calc Af Amer: 72 mL/min — ABNORMAL LOW (ref >90)
GFR calc non Af Amer: 62 mL/min — ABNORMAL LOW (ref >90)
Glucose, Bld: 102 mg/dL — ABNORMAL HIGH (ref 70–99)
Potassium: 3.5 mEq/L — ABNORMAL LOW (ref 3.7–5.3)
Sodium: 141 mEq/L (ref 137–147)
Total Protein: 6.8 g/dL (ref 6.0–8.3)

## 2014-05-09 LAB — URINALYSIS, ROUTINE W REFLEX MICROSCOPIC
Bilirubin Urine: NEGATIVE
GLUCOSE, UA: NEGATIVE mg/dL
Hgb urine dipstick: NEGATIVE
Ketones, ur: NEGATIVE mg/dL
LEUKOCYTES UA: NEGATIVE
Nitrite: NEGATIVE
PH: 5.5 (ref 5.0–8.0)
Protein, ur: NEGATIVE mg/dL
Specific Gravity, Urine: 1.011 (ref 1.005–1.030)
Urobilinogen, UA: 0.2 mg/dL (ref 0.0–1.0)

## 2014-05-09 LAB — CBC WITH DIFFERENTIAL/PLATELET
BASOS ABS: 0 10*3/uL (ref 0.0–0.1)
BASOS PCT: 0 % (ref 0–1)
Eosinophils Absolute: 0.3 10*3/uL (ref 0.0–0.7)
Eosinophils Relative: 3 % (ref 0–5)
HEMATOCRIT: 27 % — AB (ref 36.0–46.0)
Hemoglobin: 9 g/dL — ABNORMAL LOW (ref 12.0–15.0)
Lymphocytes Relative: 18 % (ref 12–46)
Lymphs Abs: 1.6 10*3/uL (ref 0.7–4.0)
MCH: 31.4 pg (ref 26.0–34.0)
MCHC: 33.3 g/dL (ref 30.0–36.0)
MCV: 94.1 fL (ref 78.0–100.0)
MONO ABS: 1 10*3/uL (ref 0.1–1.0)
Monocytes Relative: 11 % (ref 3–12)
NEUTROS ABS: 6 10*3/uL (ref 1.7–7.7)
Neutrophils Relative %: 68 % (ref 43–77)
Platelets: 423 10*3/uL — ABNORMAL HIGH (ref 150–400)
RBC: 2.87 MIL/uL — ABNORMAL LOW (ref 3.87–5.11)
RDW: 13 % (ref 11.5–15.5)
WBC: 8.9 10*3/uL (ref 4.0–10.5)

## 2014-05-09 MED ORDER — POTASSIUM CHLORIDE CRYS ER 10 MEQ PO TBCR
10.0000 meq | EXTENDED_RELEASE_TABLET | Freq: Two times a day (BID) | ORAL | Status: DC
  Administered 2014-05-09 – 2014-05-25 (×32): 10 meq via ORAL
  Filled 2014-05-09 (×38): qty 1

## 2014-05-09 NOTE — Progress Notes (Signed)
Physical Medicine and Rehabilitation Consult  Reason for Consult: L-AKA  Referring Physician: Dr. Scot Dock  HPI: Traci Mclaughlin is a 63 y.o. 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 She developed left toe ulcers as well as left knee wound with infection and worsening of pain. She was admitted on 04/30/14 for pain control as well as IV antibiotics for treatment of infection. 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 She was cleared for surgery by Dr. Burt Knack and underwent L-AKA by Dr. Scot Dock on 05/04/14.  Patient well known to CIR from prior stay. She was limited by high levels of anxiety, lability, communicative deficits (does not use sign language/ minimal ability to read lips) as well as pain. PT/OT evaluations done and CIR recommended for follow up therapy.  Review of Systems  Unable to perform ROS: language   Past Medical History   Diagnosis  Date   .  Acute kidney injury  07/06/2011   .  Hypertension    .  HOH (hard of hearing)    .  CVA (cerebral infarction)    .  PAD (peripheral artery disease)  04/10/2014   .  Cancer      cervical; was on chemo; has been in remission for 81yrs   .  Depression  04/19/2014   .  Hx of cardiovascular stress test      Nuclear (9/15): Inf ischemia, EF 35%; high risk   .  Stroke  Aug. 10, 2015   .  STEMI (ST elevation myocardial infarction)  06/2011   .  RBBB (right bundle branch block)  05/02/2014    Past Surgical History   Procedure  Laterality  Date   .  Cardiac catheterization   07/03/2011     Dr. Irish Lack   .  Tee without cardioversion  N/A  03/08/2014     Procedure: TRANSESOPHAGEAL ECHOCARDIOGRAM (TEE); Surgeon: Fay Records, MD; Location: [facility] ENDOSCOPY; Service: Cardiovascular; Laterality: N/A;   .  Loop recorder implant   03-12-2014     MDT  LINQ implanted by Dr Rayann Heman for cryptogenic stroke    Family History   Problem  Relation  Age of Onset   .  Intracerebral hemorrhage  Daughter      during childbirth   .  Stroke  Daughter    .  Heart attack  Neg Hx    .  Diabetes  Mother    .  Alcohol abuse  Mother    .  Lung cancer  Father    .  COPD  Father     Social History: Lives with family. Per reports that she has quit smoking. Her smoking use included Cigarettes. She has a 1 pack-year smoking history. She quit smokeless tobacco use about 2 years ago. Per reports that she drinks alcohol. She reports that she does not use illicit drugs.  Allergies: No Known Allergies  Medications Prior to Admission   Medication  Sig  Dispense  Refill   .  acetaminophen (TYLENOL) 500 MG tablet  Take 500 mg by mouth every 6 (six) hours as needed for mild pain.     Marland Kitchen  amLODipine (NORVASC) 10 MG tablet  Take 1 tablet (10 mg total) by mouth daily.  90 tablet  1   .  atorvastatin (LIPITOR) 40 MG tablet  Take 40 mg  by mouth daily at 6 PM.     .  clindamycin (CLEOCIN) 300 MG capsule  Take 1 capsule (300 mg total) by mouth 3 (three) times daily.  9 capsule  0   .  docusate sodium 100 MG CAPS  Take 100 mg by mouth 2 (two) times daily.  30 capsule  0   .  DULoxetine (CYMBALTA) 30 MG capsule  Take 1 capsule (30 mg total) by mouth daily.  30 capsule  3   .  HYDROcodone-acetaminophen (NORCO) 7.5-325 MG per tablet  Take 1 tablet by mouth every 6 (six) hours as needed for moderate pain or severe pain.  30 tablet  0   .  metoprolol tartrate (LOPRESSOR) 25 MG tablet  Take 1 tablet (25 mg total) by mouth 2 (two) times daily.  60 tablet  11   .  oxyCODONE-acetaminophen (PERCOCET) 10-325 MG per tablet  Take 1 tablet by mouth every 6 (six) hours as needed for pain.  30 tablet  0   .  rivaroxaban (XARELTO) 20 MG TABS tablet  Take 1 tablet (20 mg total) by mouth daily with supper.  30 tablet  1    Home:  Home Living  Family/patient expects to be discharged to:: Private  residence  Living Arrangements: Children  Available Help at Discharge: Family;Friend(s)  Type of Home: House  Home Access: Stairs to enter  [employer] of Steps: 3-4  Entrance Stairs-Rails: None  Home Layout: Two level;Able to live on main level with bedroom/bathroom  Home Equipment: Walker - 2 wheels  Additional Comments: no family present to confirm above, information gathered from previous admission; patient very [place] thus difficult to communicate  Functional History:  Prior Function  Comments: unable to detemine; patient states she was independent  Functional Status:  Mobility:  Bed Mobility  Overal bed mobility: Needs Assistance  Bed Mobility: Sit to Supine  Supine to sit: Min assist  Sit to supine: Min assist  General bed mobility comments: Min (A) to assist LEs onto bed. Pt able to push throug Bil UEs and RLE to scoot herself up in bed  Transfers  Overall transfer level: Needs assistance  Equipment used: Rolling walker (2 wheeled)  Transfers: Sit to/from [employer]  Sit to Stand: Min assist;+2 safety/equipment  Stand pivot transfers: Min assist;+2 safety/equipment  General transfer comment: Min (A) to power up and to stabilize RW. +2 for safety. Pt unable to clear RLE off floor and instead pushes through RW and "walks" her foot across the floor    ADL:  ADL  Overall ADL's : Needs assistance/impaired  Eating/Feeding: Modified independent;Sitting  Grooming: Modified independent;Sitting  Upper Body Bathing: Minimal assitance;Sitting  Lower Body Bathing: Sit to/from stand;+2 for safety/equipment;Maximal assistance  Upper Body Dressing : Minimal assistance;Sitting  Lower Body Dressing: Total assistance;+2 for safety/equipment;Sit to/from stand  Toilet Transfer: Minimal assistance;+2 for safety/equipment;Stand-pivot;RW (recliner > bed)  General ADL Comments: Pt limited by pain and residual cognitive deficits. Pt demonstrates slow processing and  difficulty problem solving, however is able to manage tasks with few VC's.  Cognition:  Cognition  Overall Cognitive Status: No family/caregiver present to determine baseline cognitive functioning  Orientation Level: Oriented to person;Oriented to place;Oriented to time;Oriented to situation  Cognition  Arousal/Alertness: Awake/alert  Behavior During Therapy: WFL for tasks assessed/performed  Overall Cognitive Status: No family/caregiver present to determine baseline cognitive functioning  Area of Impairment: Following commands;Safety/judgement;Problem solving  Following Commands: Follows one step commands with increased time  Safety/Judgement: Decreased  awareness of safety  Problem Solving: Slow processing;Requires verbal cues;Requires tactile cues  General Comments: Pt responding appropriately, however difficult to communicate due to [facility]. Pt slow to respond and demonstrates some difficulty with following commands (although this is likely related to hearing impairment).  Difficult to assess due to: Hard of hearing/deaf  Blood pressure 160/76, pulse 88, temperature 98.2 F (36.8 C), temperature source Oral, resp. rate 20, height 5\' 3"  (1.6 m), weight 78.4 kg (172 lb 13.5 oz), SpO2 100.00%.  Physical Exam  Nursing note and vitals reviewed.  Constitutional: She appears well-developed and well-nourished.  Patient very familiar to me from prior rehab stay. Did not recognize me or remember CIR. Attempted to communicate by writing but verbal output limited to "I don't know" and "I can't hear".  HENT:  Head: Normocephalic and atraumatic.  Eyes: Conjunctivae are normal. Pupils are equal, round, and reactive to light.  Neck: Normal range of motion. Neck supple.  Cardiovascular: Normal rate and regular rhythm.  Respiratory: Effort normal and breath sounds normal.  GI: Soft. Bowel sounds are normal.  Musculoskeletal:  No edema BUE/RLE. LLE with substantial edema still. Limb tender.  Neurological:  She is alert.  Calmer and appropriate. Was able to follow simple commands with gestures. Hearing poor. Able read written information without issue. UE's 4/5. RLE: 4/5 prox to distal. LLE: HF 2+  Skin: Skin is warm and dry.  L-AKA with dry dressing.  Psychiatric: She has a normal mood and affect. Her behavior is normal.   Results for orders placed during the hospital encounter of 04/30/14 (from the past 24 hour(s))   BASIC METABOLIC PANEL Status: Abnormal    Collection Time    05/07/14 6:21 AM   Result  Value  Ref Range    Sodium  137  137 - 147 mEq/L    Potassium  3.8  3.7 - 5.3 mEq/L    Chloride  100  96 - 112 mEq/L    CO2  23  19 - 32 mEq/L    Glucose, Bld  111 (*)  70 - 99 mg/dL    BUN  7  6 - 23 mg/dL    Creatinine, Ser  0.90  0.50 - 1.10 mg/dL    Calcium  8.9  8.4 - 10.5 mg/dL    GFR calc non Af Amer  67 (*)  >90 mL/min    GFR calc Af Amer  77 (*)  >90 mL/min    Anion gap  14  5 - 15   MAGNESIUM Status: None    Collection Time    05/07/14 6:21 AM   Result  Value  Ref Range    Magnesium  1.6  1.5 - 2.5 mg/dL   HEPARIN LEVEL (UNFRACTIONATED) Status: None    Collection Time    05/07/14 6:24 AM   Result  Value  Ref Range    Heparin Unfractionated  0.50  0.30 - 0.70 IU/mL    No results found.  Assessment/Plan:  Diagnosis: s/p left AKA  1. Does the need for close, 24 hr/day medical supervision in concert with the patient's rehab needs make it unreasonable for this patient to be served in a less intensive setting? Yes 2. Co-Morbidities requiring supervision/potential complications: ckd, htn, depression cm,  3. Due to bladder management, bowel management, safety, skin/wound care, disease management, medication administration, pain management and patient education, does the patient require 24 hr/day rehab nursing? Yes 4. Does the patient require coordinated care of a physician, rehab nurse, PT (  1-2 hrs/day, 5 days/week) and OT (1-2 hrs/day, 5 days/week) to address physical and  functional deficits in the context of the above medical diagnosis(es)? Yes Addressing deficits in the following areas: balance, endurance, locomotion, strength, transferring, bowel/bladder control, bathing, dressing, feeding, grooming, toileting and psychosocial support 5. Can the patient actively participate in an intensive therapy program of at least 3 hrs of therapy per day at least 5 days per week? Yes 6. The potential for patient to make measurable gains while on inpatient rehab is excellent 7. Anticipated functional outcomes upon discharge from inpatient rehab are modified independent and supervision with PT, modified independent and supervision with OT, n/a with SLP. 8. Estimated rehab length of stay to reach the above functional goals is: 7-10 days 9. Does the patient have adequate social supports to accommodate these discharge functional goals? Yes and Potentially 10. Anticipated D/C setting: Home 11. Anticipated post D/C treatments: HH therapy and Outpatient therapy 12. Overall Rehab/Functional Prognosis: excellent RECOMMENDATIONS:  This patient's condition is appropriate for continued rehabilitative care in the following setting: CIR  Patient has agreed to participate in recommended program. Yes  Note that insurance prior authorization may be required for reimbursement for recommended care.  Comment: Rehab Admissions Coordinator to follow up.  Thanks,  Meredith Staggers, MD, Mellody Drown  05/07/2014  Revision History...      Date/Time User Action    05/07/2014 9:43 AM Meredith Staggers, MD Sign    05/07/2014 8:22 AM Bary Leriche, PA-C Share    05/07/2014 8:21 AM Bary Leriche, PA-C Share   View Details Report    Routing History.Marland KitchenMarland Kitchen

## 2014-05-09 NOTE — Progress Notes (Signed)
Subjective/Complaints: 63 y.o. 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 Patient well known to CIR from prior stay. She developed left toe ulcers as well as left knee wound with infection and worsening of pain. She was admitted on 04/30/14 for pain control as well as IV antibiotics for treatment of infection. 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 She was cleared for surgery by Dr. Burt Knack and underwent L-AKA by Dr. Scot Dock on 05/04/14.    Objective: Vital Signs: Blood pressure 146/77, pulse 97, temperature 98.6 F (37 C), temperature source Oral, resp. rate 18, weight 73.5 kg (162 lb 0.6 oz), SpO2 99.00%. No results found. Results for orders placed during the hospital encounter of 05/08/14 (from the past 72 hour(s))  URINALYSIS, ROUTINE W REFLEX MICROSCOPIC     Status: None   Collection Time    05/09/14  6:07 AM      Result Value Ref Range   Color, Urine YELLOW  YELLOW   APPearance CLEAR  CLEAR   Specific Gravity, Urine 1.011  1.005 - 1.030   pH 5.5  5.0 - 8.0   Glucose, UA NEGATIVE  NEGATIVE mg/dL   Hgb urine dipstick NEGATIVE  NEGATIVE   Bilirubin Urine NEGATIVE  NEGATIVE   Ketones, ur NEGATIVE  NEGATIVE mg/dL   Protein, ur NEGATIVE  NEGATIVE mg/dL   Urobilinogen, UA 0.2  0.0 - 1.0 mg/dL   Nitrite NEGATIVE  NEGATIVE   Leukocytes, UA NEGATIVE  NEGATIVE   Comment: MICROSCOPIC NOT DONE ON URINES WITH NEGATIVE PROTEIN, BLOOD, LEUKOCYTES, NITRITE, OR GLUCOSE <1000 mg/dL.  CBC WITH DIFFERENTIAL     Status: Abnormal   Collection Time    05/09/14  6:50 AM      Result Value Ref Range   WBC 8.9  4.0 - 10.5 K/uL   RBC 2.87 (*) 3.87 - 5.11 MIL/uL   Hemoglobin 9.0 (*) 12.0 - 15.0 g/dL   HCT 27.0 (*) 36.0 - 46.0 %   MCV 94.1  78.0 - 100.0 fL   MCH 31.4  26.0 - 34.0 pg   MCHC 33.3   30.0 - 36.0 g/dL   RDW 13.0  11.5 - 15.5 %   Platelets 423 (*) 150 - 400 K/uL   Neutrophils Relative % 68  43 - 77 %   Neutro Abs 6.0  1.7 - 7.7 K/uL   Lymphocytes Relative 18  12 - 46 %   Lymphs Abs 1.6  0.7 - 4.0 K/uL   Monocytes Relative 11  3 - 12 %   Monocytes Absolute 1.0  0.1 - 1.0 K/uL   Eosinophils Relative 3  0 - 5 %   Eosinophils Absolute 0.3  0.0 - 0.7 K/uL   Basophils Relative 0  0 - 1 %   Basophils Absolute 0.0  0.0 - 0.1 K/uL     HEENT: poor dentition Cardio: RRR and no murmur Resp: CTA B/L and unlabored GI: BS positive and NT, ND Extremity:  Pulses positive and No Edema. Right foot Skin:   Intact and Wound C/D/I and Left AKA incision without tenderness, Right foot without lesion Neuro: Flat, Cranial Nerve II-XII normal, Abnormal Motor 4/5 in RUE, 3- R HF, 4/5 R KE, ADF, 5/5 LUE, 3/5 L HF, Abnormal FMC Ataxic/ dec FMC and Dysarthric Musc/Skel:  Swelling Left thigh no erythema Gen NAD  Assessment/Plan: 1. 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 Physiatrist is providing close team supervision and 24 hour management of active medical problems listed below. Physiatrist and rehab team continue to assess barriers to discharge/monitor patient progress toward functional and medical goals. FIM:                                  Medical Problem List and Plan:  1. 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  2. LLE DVT/Anticoagulation: Pharmaceutical: Xarelto  3. Pain Management: Will start patient on OxyContin 10 mg bid and continue oxycodone prn.  -observe mental status closely  4. Mood: Continue cymbalta for pain as well as mood stabilization. Team to provide ego support and encouragement to help with participation. LCSW to follow for evaluation and support.  5. Neuropsych: This patient is not capable of making decisions on her  own behalf.  6. Skin/Wound Care: Monitor wound daily for healing.  7. Fluids/Electrolytes/Nutrition: Monitor intake for adequate hydration and nutritional status.  8. HTN: Will monitor every 8 hours. Continue Coreg and Altace.  9. CAD: Monitor for symptoms with recommendations to add nitrate if needed. Continue coreg bid, Altace and Lipitor.  10. Recent L-MCA infarct residual mild R HP: on Xarelto.  11. Hypokalemia: resolved past supplementation. Will recheck in am.  12. Leucocytosis: Resolving. Monitor for signs of infection. Monitor temperature curve. Off antibiotics. Recheck in am.  13. Acute blood loss anemia: Will check CBC for follow up in am.    LOS (Days) 1 A FACE TO FACE EVALUATION WAS PERFORMED  Skiler Tye E 05/09/2014, 7:19 AM

## 2014-05-09 NOTE — Evaluation (Signed)
Physical Therapy Assessment and Plan  Patient Details  Name: Traci Mclaughlin MRN: [id number] Date of Birth: [birth year]  PT Diagnosis: Abnormal posture, Abnormality of gait, Cognitive deficits, Coordination disorder, Difficulty walking, Edema, Hemiparesis dominant, Impaired cognition, Muscle weakness and Pain in L residual limb and phantom limb Rehab Potential: Good ELOS: 7-10 days   Today's Date: 05/09/2014 PT Individual Time: 0930-1030 PT Individual Time Calculation (min): 60 min    Problem List:  Patient Active Problem List   Diagnosis Date Noted  . Unilateral complete AKA 05/08/2014  . CAD (coronary atherosclerotic disease) 05/02/2014  . Preop cardiovascular exam 05/02/2014  . RBBB (right bundle branch block) 05/02/2014  . 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 History of DVT (deep vein thrombosis) 05/02/2014  . Ulcer of left lower leg 04/30/2014  . Leukocytosis 04/26/2014  . Pain in joint, ankle and foot 04/19/2014  . Depression 04/19/2014  . PAD (peripheral artery disease) 04/10/2014  . Left leg pain 03/31/2014  . Elevated troponin 03/11/2014  . 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 Right hemiplegia 03/07/2014  . Dyslipidemia 03/07/2014  . Stroke 03/05/2014  . CKD (chronic kidney disease) stage 3, GFR 30-59 ml/min 03/05/2014  . Hypertension 03/05/2014  . CVA (cerebral infarction) 03/05/2014  . Elevation of cardiac enzymes 07/06/2011  . Cervical cancer 07/06/2011  . Viral gastroenteritis 07/06/2011  . Acute kidney injury 07/06/2011    Past Medical History:  Past Medical History  Diagnosis Date  . Hypertension   . HOH (hard of hearing)   . CVA (cerebral infarction)   . PAD (peripheral artery disease) 04/10/2014  . Cancer     cervical; was on chemo; has been in remission for 67yr  . Depression 04/19/2014  . Hx of cardiovascular stress test     Nuclear (9/15):  Inf ischemia, EF 35%; high risk  . Stroke Aug. 10, 2015  . STEMI (ST  elevation myocardial infarction) 06/2011  . RBBB (right bundle branch block) 05/02/2014  . Chronic kidney disease     daughter says no,they checked them,okay   Past Surgical History:  Past Surgical History  Procedure Laterality Date  . Cardiac catheterization  07/03/2011    Dr. VIrish Lack . Tee without cardioversion N/A 03/08/2014    Procedure: TRANSESOPHAGEAL ECHOCARDIOGRAM (TEE);  Surgeon: PFay Records MD;  [facility]  Service: Cardiovascular;  Laterality: N/A;  . 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 Amputation Left 05/04/2014    Procedure: AMPUTATION ABOVE KNEE;  Surgeon: CAngelia Mould MD;  [place]  Service: Vascular;  Laterality: Left;    Assessment & Plan Clinical Impression: AAlannah Averhartis a 63y.o. 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 Patient well known to CIR from prior stay. She developed left toe ulcers as well as left knee wound with infection and worsening of pain. She was admitted on 04/30/14 for pain control as well as IV antibiotics for treatment of infection. 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 She was cleared for surgery by Dr. CBurt Knackand underwent L-AKA by Dr. DScot Dockon 05/04/14. PT/OT evaluations done and CIR recommended for follow up therapy. She is limited by anxiety, lability, communicative deficits (does not use sign language/ minimal ability to read lips) as well  as pain.    Patient transferred to CIR on 05/08/2014 .   Patient currently requires min with mobility secondary to muscle weakness, decreased cardiorespiratoy endurance, impaired timing and sequencing, motor apraxia, decreased coordination and decreased motor planning, decreased visual acuity and decreased  visual perceptual skills, decreased attention to right, decreased awareness, decreased problem solving, decreased safety awareness and decreased memory and decreased sitting balance, decreased standing balance, decreased postural control, hemiplegia and decreased balance strategies.  Prior to hospitalization, patient was independent  with mobility and lived with Daughter in a House home.  Home access is 3-4Stairs to enter.  Patient will benefit from skilled PT intervention to maximize safe functional mobility, minimize fall risk and decrease caregiver burden for planned discharge home with 24 hour assist.  Anticipate patient will benefit from follow up [facility] at discharge.  PT - End of Session Activity Tolerance: Tolerates 10 - 20 min activity with multiple rests Endurance Deficit: Yes Endurance Deficit Description: cardiorespiratory PT Assessment Rehab Potential: Good Barriers to Discharge: Inaccessible home environment PT Patient demonstrates impairments in the following area(s): Balance;Skin Integrity;Behavior;Edema;Endurance;Motor;Pain;Perception;Safety PT Transfers Functional Problem(s): Bed Mobility;Bed to Chair;Car;Furniture PT Locomotion Functional Problem(s): Ambulation;Wheelchair Mobility;Stairs PT Plan PT Intensity: Minimum of 1-2 x/day ,45 to 90 minutes PT Frequency: 5 out of 7 days PT Duration Estimated Length of Stay: 7-10 days PT Treatment/Interventions: Ambulation/gait training;Discharge planning;Functional mobility training;Psychosocial support;Therapeutic Activities;Visual/perceptual remediation/compensation;Balance/vestibular training;Disease management/prevention;Neuromuscular re-education;Skin care/wound management;Therapeutic Exercise;Wheelchair propulsion/positioning;Cognitive remediation/compensation;DME/adaptive equipment instruction;Pain management;Splinting/orthotics;UE/LE Strength taining/ROM;Community reintegration;Patient/family education;Stair training;UE/LE Coordination  activities PT Transfers Anticipated Outcome(s): Mod I PT Locomotion Anticipated Outcome(s): supervision short distances PT Recommendation Recommendations for Other Services: Neuropsych consult (coping with grief of losing leg) Follow Up Recommendations: 24 hour supervision/assistance;Home health PT Patient destination: Home Equipment Recommended: To be determined;Wheelchair cushion (measurements);Wheelchair (measurements) Equipment Details: tbd  Skilled Therapeutic Intervention PT Evaluation: PT completes functional mobility assessment, as well as history. Pt is very fearful of movement and did not wish for PT to touch L residual limb, but did allow PT to ace-wrap it for edema management. Pt tearful multiple times during PT evaluation, seemed overwhelmed by grief and the new difficulty of movement, as well as pain; PT requested neuropsych consult for grief counseling. Pt overall min A for mobility at time of evaluation and long term goals set for mod I transfers and bed mobility. Continue per PT POC.   Therapeutic Activity: PT instructs pt in scoot transfer w/c to bed req min A, visual cues (written) for sequencing, and tactile cues for hand placement. Pt completes sit to supine with supervision. PT instructs pt to roll R and L, but pt refuses to fully roll. PT instructs pt in supine to sit transfer req CGA. PT instructs pt in bed to chair transfer req min A for sit to stand with RW and CG for step-hop transfer.   Gait Training: PT instructs pt in step-hop ambulation with RW x 5' req CGA for safety; pt becomes emotional and tearful during gait, stating that everyone was staring at her (referring to a visitor watching her ambulate in the hallway).   Pt returned to room and stops crying. PT offers written words of encouragement and all needs within reach.    PT Evaluation Precautions/Restrictions Precautions Precautions: Fall Precaution Comments: LLE AKA-HOH- needs written  communication Restrictions Weight Bearing Restrictions: Yes LLE Weight Bearing: Non weight bearing Other Position/Activity Restrictions: wears Darco sandal to unweight L toes General Chart Reviewed: Yes Family/Caregiver Present: No Vital Signs  Pain Pain Assessment Pain Assessment: Faces Pain Score:  8  Faces Pain Scale: Hurts a little bit Pain Type: Acute pain Pain Location: Leg Pain Orientation: Left Pain Descriptors / Indicators: Throbbing Pain Frequency: Constant Pain Onset: On-going Patients Stated Pain Goal: 4 Pain Intervention(s): Medication (See eMAR) Multiple Pain Sites: No Home Living/Prior Functioning Home Living Available Help at Discharge: Family;Friend(s) Type of Home: House Home Access: Stairs to enter [employer] of Steps: 3-4 Entrance Stairs-Rails: None Home Layout: Two level;Able to live on main level with bedroom/bathroom Additional Comments: no family present to confirm above, information gathered from patient using whiteboard  Lives With: Daughter Prior Function Level of Independence: Independent with basic ADLs;Independent with transfers;Independent with gait  Able to Take Stairs?: Yes Vocation: Retired Radiographer, therapeutic - Assessment Additional Comments: unsure what has changed since CVA Praxis Praxis-Other Comments: R UE strong, but unable to push the wheelchair on command  Cognition Overall Cognitive Status: No family/caregiver present to determine baseline cognitive functioning Arousal/Alertness: Awake/alert Orientation Level: Oriented to person;Oriented to place;Disoriented to time;Disoriented to situation Attention: Focused;Sustained Focused Attention: Appears intact Sustained Attention: Appears intact Selective Attention: Appears intact Memory: Impaired Memory Impairment: Decreased recall of new information Awareness: Impaired Awareness Impairment: Anticipatory impairment Problem Solving: Impaired Problem Solving  Impairment: Functional basic Behaviors: Poor frustration tolerance;Lability Safety/Judgment: Appears intact Sensation Sensation Light Touch: Appears Intact Stereognosis: Not tested Hot/Cold: Not tested Proprioception: Appears Intact Coordination Gross Motor Movements are Fluid and Coordinated: No Fine Motor Movements are Fluid and Coordinated: Not tested Coordination and Movement Description: apprehensive in moving L LE, functional beakness noted in R UE Motor  Motor Motor: Motor apraxia Motor - Skilled Clinical Observations: apraxic in R UE during w/c propulsion  Mobility Bed Mobility Bed Mobility: Sit to Supine;Supine to Sit Supine to Sit: 4: Min guard Supine to Sit Details: Visual cues/gestures for sequencing;Manual facilitation for placement Supine to Sit Details (indicate cue type and reason): PT wrote step by step instructions down for pt prior to her completing the activity.  Sit to Supine: 5: Supervision Sit to Supine - Details: Visual cues/gestures for sequencing Sit to Supine - Details (indicate cue type and reason): PT wrote step by step instructions down for pt prior to her completing the activity.  Transfers Transfers: Yes Sit to Stand: 4: Min assist Sit to Stand Details: Manual facilitation for placement;Visual cues/gestures for sequencing Sit to Stand Details (indicate cue type and reason): PT wrote step by step instructions down for pt prior to her completing the activity.  Stand to Sit: 4: Min assist Stand Pivot Transfers: 4: Min Psychologist, occupational Details: Visual cues/gestures for sequencing Stand Pivot Transfer Details (indicate cue type and reason): PT wrote step by step instructions down for pt prior to her completing the activity.  Lateral/Scoot Transfers: 4: Min Engineer, building services Details: Manual facilitation for placement;Visual cues/gestures for sequencing Lateral/Scoot Transfer Details (indicate cue type and reason): PT wrote step by  step instructions down for pt prior to her completing the activity.  Locomotion  Ambulation Ambulation: Yes Ambulation/Gait Assistance: 4: Min guard Ambulation Distance (Feet): 5 Feet Assistive device: Rolling walker Ambulation/Gait Assistance Details: PT demonstrates how pt can slide walker and then do a small hop up to it. Very small step length and pt becomes tearful, crying out that people were staring at her (referring to a visitor who was watching her walk).  Gait Gait: Yes Gait Pattern: Impaired Gait Pattern: Trunk flexed;Decreased step length - right Stairs / Additional Locomotion Stairs: No Wheelchair Mobility Wheelchair Mobility: Yes Wheelchair Assistance: 3:  Mod assist Wheelchair Assistance Details: Manual facilitation for placement;Visual cues for safe use of DME/AE;Tactile cues for placement Wheelchair Propulsion: Both upper extremities Wheelchair Parts Management: Needs assistance Distance: 25  Trunk/Postural Assessment  Cervical Assessment Cervical Assessment: Within Functional Limits Thoracic Assessment Thoracic Assessment: Within Functional Limits Lumbar Assessment Lumbar Assessment: Within Functional Limits Postural Control Postural Control: Deficits on evaluation Protective Responses: delayed Postural Limitations: Pt tends to compensate for movements with posterior or R lateral lean.   Balance Balance Balance Assessed: Yes Static Sitting Balance Static Sitting - Balance Support: Bilateral upper extremity supported;Feet supported Static Sitting - Level of Assistance: 5: Stand by assistance Dynamic Sitting Balance Dynamic Sitting - Balance Support: Bilateral upper extremity supported;During functional activity;Feet supported Dynamic Sitting - Level of Assistance: 5: Stand by assistance Dynamic Sitting - Balance Activities: Lateral lean/weight shifting Static Standing Balance Static Standing - Balance Support: Bilateral upper extremity supported;During  functional activity Static Standing - Level of Assistance: 4: Min assist Dynamic Standing Balance Dynamic Standing - Balance Support: Bilateral upper extremity supported;During functional activity Dynamic Standing - Level of Assistance: 4: Min assist Dynamic Standing - Balance Activities: Lateral lean/weight shifting;Forward lean/weight shifting Extremity Assessment  RUE Assessment RUE Assessment: Within Functional Limits RUE AROM (degrees) RUE Overall AROM Comments: WFL RUE PROM (degrees) RUE Overall PROM Comments: WFL LUE Assessment LUE Assessment: Within Functional Limits RLE Assessment RLE Assessment: Within Functional Limits LLE Assessment LLE Assessment: Exceptions to WFL LLE AROM (degrees) Overall AROM Left Lower Extremity: Deficits;Due to pain LLE Overall AROM Comments: Pt uses arms to assist moving L LE while PT ace-wraps it. Pt refuses for PT to otherwise touch pt's L residual limb.  LLE Strength LLE Overall Strength: Deficits;Due to pain LLE Overall Strength Comments: Pt uses arms to assist moving L LE while PT ace-wraps it. Pt refuses for PT to otherwise touch pt's L residual limb. Grossly 2+/5  FIM:  FIM - Control and instrumentation engineer Devices: Walker;Arm rests Bed/Chair Transfer: 4: Supine > Sit: Min A (steadying Pt. > 75%/lift 1 leg);5: Sit > Supine: Supervision (verbal cues/safety issues);4: Bed > Chair or W/C: Min A (steadying Pt. > 75%);4: Chair or W/C > Bed: Min A (steadying Pt. > 75%) FIM - Locomotion: Wheelchair Distance: 25 Locomotion: Wheelchair: 1: Travels less than 50 ft with moderate assistance (Pt: 50 - 74%) FIM - Locomotion: Ambulation Locomotion: Ambulation Assistive Devices: Administrator Ambulation/Gait Assistance: 4: Min guard Locomotion: Ambulation: 1: Travels less than 50 ft with minimal assistance (Pt.>75%) FIM - Locomotion: Stairs Locomotion: Stairs: 0: Activity did not occur   Refer to Care Plan for Long Term  Goals  Recommendations for other services: Neuropsych  Discharge Criteria: Patient will be discharged from PT if patient refuses treatment 3 consecutive times without medical reason, if treatment goals not met, if there is a change in medical status, if patient makes no progress towards goals or if patient is discharged from hospital.  The above assessment, treatment plan, treatment alternatives and goals were discussed and mutually agreed upon: by patient  [facility] M 05/09/2014, 12:48 PM

## 2014-05-09 NOTE — Progress Notes (Signed)
Occupational Therapy Session Note  Patient Details  Name: Traci Mclaughlin MRN: [id number] Date of Birth: 06-26-1951  Today's Date: 05/09/2014 OT Individual Time: 1330-1435 OT Individual Time Calculation (min): 65 min   Short Term Goals: Week 1:  OT Short Term Goal 1 (Week 1): LTG=STG due to short LOS  Skilled Therapeutic Interventions/Progress Updates:  Patient resting in w/c upon arrival.  Engaged in tub bench transfer in patient's hospital room and discussed then demonstrated use of tub bench in tub shower combo to simulate tub bench use at home, issued inspection mirror.  Patient tearful several times during session yet continued to participate in therapy to include need to use dry erase board to communicate to patient.  Patient was min assist for squat pivot/scoot transfer w/c><tub bench in hospital room using grab bar.  Reviewed need to know if w/c will fit in bathroom at home so we can help patient and family plan for discharge.  Patient may need to hop on RLE into bathroom with RW at discharge to be able to access toilet and/or tub/shower.  Patient also propelled w/c short distance to improve endurance and strength.  Therapy Documentation Precautions:  Precautions Precautions: Fall Precaution Comments: LLE AKA-HOH- needs written communication Restrictions Weight Bearing Restrictions: Yes LLE Weight Bearing: Non weight bearing Pain: No report of pain ADL: See FIM for current functional status  Therapy/Group: Individual Therapy  Azarah Dacy 05/09/2014, 12:35 PM

## 2014-05-09 NOTE — Progress Notes (Signed)
PMR Admission Coordinator Pre-Admission Assessment  Patient: Traci Mclaughlin is an 63 y.o., female  MRN: [id number]  DOB: June 03, 1951  Height: 5' 3"  (160 cm)  Weight: 78.4 kg (172 lb 13.5 oz)  Insurance Information  HMO: Yes PPO: PCP: IPA: 80/20: OTHER: Group # [id number]  PRIMARY: AARP Medicare Complete Policy#: [id number] Subscriber: [place] Name: Gaylan Gerold Phone#: [phone] Fax#:  Pre-Cert#: [id number] Employer: Retired  Benefits: Phone #: [phone] Name: Rolla Flatten. Date: 07/27/13 Deduct: $0 Out of Pocket Max: [phone] (met [phone]) Life Max: none  CIR: $430 days 1-4 SNF: $0 days 1-20; $155 days 21-64; $0 days 65-100  Outpatient: w/medical necessity Co-Pay: $40/visit each therapy  Home Health: 100% Co-Pay: none  DME: 80% Co-Pay: 20%  Providers: in network only  Emergency Theatre manager Information    Name  Relation  Home  Work  Mobile    [place]  Daughter  [place]  [phone]        Current Medical History  Patient Admitting Diagnosis: L AKA  History of Present Illness: A 63 y.o. 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 Patient well known to CIR from prior stay. She developed left toe ulcers as well as left knee wound with infection and worsening of pain. She was admitted on 04/30/14 for pain control as well as IV antibiotics for treatment of infection. 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 She was cleared for surgery by Dr. Burt Knack and underwent L-AKA by Dr. Scot Dock on 05/04/14. PT/OT evaluations done and CIR recommended for follow up therapy. She is limited by anxiety, lability, communicative deficits (does not use sign language/ minimal ability to read lips) as well as pain.  Total: 4=NIH   Past Medical History  Past Medical History   Diagnosis  Date   .  Acute kidney injury  07/06/2011   .  Hypertension    .  HOH (hard of hearing)    .  CVA (cerebral infarction)    .  PAD (peripheral artery disease)  04/10/2014   .  Cancer      cervical; was on chemo; has been in remission for 73yr   .  Depression  04/19/2014   .  Hx of cardiovascular stress test      Nuclear (9/15): Inf ischemia, EF 35%; high risk   .  Stroke  Aug. 10, 2015   .  STEMI (ST elevation myocardial infarction)  06/2011   .  RBBB (right bundle branch block)  05/02/2014    Family History  family history includes Alcohol abuse in her mother; COPD in her father; Diabetes in her mother; Intracerebral hemorrhage in her daughter; Lung cancer in her father; Stroke in her daughter. There is no history of Heart attack.  Prior Rehab/Hospitalizations: [facility]15[facility]  Current Medications  Current facility-administered medications:acetaminophen (TYLENOL) suppository 325-650 mg, 325-650 mg, Rectal, Q4H PRN, Samantha J Rhyne, PA-C; acetaminophen (TYLENOL) tablet 325-650 mg, 325-650 mg, Oral, Q4H PRN, Samantha J Rhyne, PA-C; alum & mag hydroxide-simeth (MAALOX/MYLANTA) 200-200-20 MG/5ML suspension 15-30 mL, 15-30 mL, Oral, Q2H PRN, Samantha J Rhyne, PA-C  aspirin EC tablet 81 mg, 81 mg, Oral, Daily, JCarlena Bjornstad MD, 81 mg at 05/08/14 1021; atorvastatin (LIPITOR)  tablet 40 mg, 40 mg, Oral, q1800, [name], MD, 40 mg at 05/07/14 1741; carvedilol (COREG) tablet 18.75 mg, 18.75 mg, Oral, BID WC, Rhonda G Barrett, PA-C, 18.75 mg at 05/08/14 0839; docusate sodium (COLACE) capsule 100 mg, 100 mg, Oral, Daily, Samantha J Rhyne, PA-C, 100 mg at 05/08/14 1021  DULoxetine (CYMBALTA) DR capsule 30 mg, 30 mg, Oral, Daily, [name], MD, 30 mg at 05/08/14 1021; guaiFENesin-dextromethorphan (ROBITUSSIN DM) 100-10 MG/5ML syrup 15 mL, 15 mL, Oral, Q4H PRN, Samantha J Rhyne, PA-C; hydrALAZINE  (APRESOLINE) injection 10 mg, 10 mg, Intravenous, Q4H PRN, Modena Jansky, MD; morphine 2 MG/ML injection 2 mg, 2 mg, Intravenous, Q4H PRN, Modena Jansky, MD, 2 mg at 05/07/14 0418  ondansetron [facility]) injection 4 mg, 4 mg, Intravenous, Q6H PRN, Hulen Shouts Rhyne, PA-C, 4 mg at 05/04/14 2039; oxyCODONE (Oxy IR/ROXICODONE) immediate release tablet 5 mg, 5 mg, Oral, Q6H PRN, [name], MD, 5 mg at 05/08/14 1029; oxyCODONE-acetaminophen (PERCOCET/ROXICET) 5-325 MG per tablet 1 tablet, 1 tablet, Oral, Q6H PRN, [name], MD, 1 tablet at 05/08/14 1029  pantoprazole (PROTONIX) EC tablet 40 mg, 40 mg, Oral, Daily, Samantha J Rhyne, PA-C, 40 mg at 05/08/14 1021; phenol (CHLORASEPTIC) mouth spray 1 spray, 1 spray, Mouth/Throat, PRN, Samantha J Rhyne, PA-C; ramipril (ALTACE) capsule 5 mg, 5 mg, Oral, Daily, Modena Jansky, MD, 5 mg at 05/08/14 1021; rivaroxaban (XARELTO) tablet 20 mg, 20 mg, Oral, Q supper, Modena Jansky, MD, 20 mg at 05/07/14 1318  sodium chloride 0.9 % injection 3 mL, 3 mL, Intravenous, Q12H, [name], MD, 3 mL at 05/08/14 1155; sodium chloride 0.9 % injection 3 mL, 3 mL, Intravenous, PRN, [name], MD, 3 mL at 05/05/14 1454; white petrolatum (VASELINE) gel, , Topical, PRN, Modena Jansky, MD  Patients Current Diet: Cardiac  Precautions / Restrictions  Precautions  Precautions: Fall  Precaution Comments: LLE AKA  Restrictions  Weight Bearing Restrictions: Yes  LLE Weight Bearing: Non weight bearing  Prior Activity Level  Limited Community (1-2x/wk): Went out to appointment 1-2 X a week per daughter.  Home Assistive Devices / Equipment  Home Assistive Devices/Equipment: Environmental consultant (specify type)  Home Equipment: Walker - 2 wheels  Prior Functional Level  Prior Function  Comments: unable to detemine; patient states she was independent  Current Functional Level  Cognition  Overall Cognitive Status: No family/caregiver present to determine baseline cognitive  functioning  Difficult to assess due to: Hard of hearing/deaf  Orientation Level: Oriented to person;Oriented to place;Oriented to time;Oriented to situation  Following Commands: Follows one step commands with increased time (with written and visual instruction)  Safety/Judgement: Decreased awareness of safety  General Comments: Pt responding appropriately, however difficult to communicate due to [facility] and impaired speech. Some difficulty with commands even with written and visual instruction simultaneously.   Extremity Assessment  (includes Sensation/Coordination)  Upper Extremity Assessment: Overall WFL for tasks assessed  Lower Extremity Assessment: Overall WFL for tasks assessed   ADLs  Overall ADL's : Needs assistance/impaired  Eating/Feeding: Modified independent;Sitting  Grooming: Set up;Sitting;Wash/dry hands;Wash/dry face;Oral care  Upper Body Bathing: Minimal assitance;Sitting  Lower Body Bathing: Sit to/from stand;+2 for safety/equipment;Maximal assistance  Upper Body Dressing : Set up;Sitting  Lower Body Dressing: Total assistance;+2 for safety/equipment;Sit to/from stand  Toilet Transfer: Minimal assistance;Stand-pivot;BSC;RW  Toileting- Clothing Manipulation and Hygiene: Total assistance;Sit to/from stand  General ADL Comments: transferred x 2 to R side, encouragement needed to stay up in chair   Mobility  Overal bed mobility:  Needs Assistance  Bed Mobility: Supine to Sit  Supine to sit: Min guard  Sit to supine: Min assist  General bed mobility comments: pt in recliner on arrival   Transfers  Overall transfer level: Needs assistance  Equipment used: Rolling walker (2 wheeled)  Transfers: Stand Pivot Transfers  Sit to Stand: Min assist  Stand pivot transfers: Min assist  General transfer comment: min assist with cues for hand placement, sequence to scoot to edge of chair and not following cues to reach back for chair to sit.   Ambulation / Gait / Stairs / Scientist, clinical (histocompatibility and immunogenetics) / Balance  Sitting-balance support: No upper extremity supported  Sitting balance-Leahy Scale: Good   Special needs/care consideration  BiPAP/CPAP No  CPM No  Continuous Drip IV No  Dialysis No  Life Vest No  Oxygen No  Special Bed No  Trach Size No  Wound Vac (area) No  Skin Has new L AKA incision  Bowel mgmt: Last BM 05/07/14  Bladder mgmt: Incontinent at times, catheter removed on 05/07/14  Diabetic mgmt No   Previous Home Environment  Living Arrangements: Children  Available Help at Discharge: Family;Friend(s)  Type of Home: House  Home Layout: Two level;Able to live on main level with bedroom/bathroom  Home Access: Stairs to enter  Entrance Stairs-Rails: None  Entrance Stairs-Number of Steps: 3-4  Bathroom Shower/Tub: Administrator, Civil Service: Standard  Home Care Services: Yes  Type of Home Care Services: Home RN;Other (Comment) (Speech)  [place] (if known): Advanced  Additional Comments: no family present to confirm above, information gathered from previous admission; patient very [place] thus difficult to communicate  Discharge Living Setting  Plans for Discharge Living Setting: House;Lives with (comment) (Lives with daughter, 4 grandchildren and a great grandson.)  Type of Home at Discharge: House  Discharge Home Layout: Two level;Laundry or work area in basement;Able to live on main level with bedroom/bathroom  Alternate Therapist, sports of Steps: Flight to basement, but does not have to go down to basement.  Discharge Home Access: Stairs to enter  Entrance Stairs-Number of Steps: 3 steps to side and to front entrance.  Does the patient have any problems obtaining your medications?: No  Social/Family/Support Systems  Patient Roles: Parent (Has daughters and grandchildren.)  Contact Information: Philip Aspen - daughter  Anticipated Caregiver: daughter  Anticipated Caregiver's Contact Information: [name] [phone]   Ability/Limitations of Caregiver: Has daughter and grandchildren and great grandchildren  Caregiver Availability: 24/7  Discharge Plan Discussed with Primary Caregiver: Yes  Is Caregiver In Agreement with Plan?: Yes  Does Caregiver/Family have Issues with Lodging/Transportation while Pt is in Rehab?: No  Goals/Additional Needs  Patient/Family Goal for Rehab: PT/OT mod I/S goals  Expected length of stay: 7-10 days  Cultural Considerations: No pork products per daughter  Dietary Needs: Heart diet, thin liquids  Equipment Needs: TBD  Pt/Family Agrees to Admission and willing to participate: Yes  Program Orientation Provided & Reviewed with Pt/Caregiver Including Roles & Responsibilities: Yes  Decrease burden of Care through IP rehab admission: N/A  Possible need for SNF placement upon discharge: Not planned  Patient Condition: This patient's condition remains as documented in the consult dated 05/07/14, in which the Rehabilitation Physician determined and documented that the patient's condition is appropriate for intensive rehabilitative care in an inpatient rehabilitation facility. Will admit to inpatient rehab today.  Preadmission Screen Completed By: Retta Diones, 05/08/2014 2:41 PM  ______________________________________________________________________  Discussed status with Dr.  Naaman Plummer on 05/08/14 at 61 and received telephone approval for admission today.  Admission Coordinator: Retta Diones, time1440/Date10/13/15  Cosigned by: Meredith Staggers, MD [05/08/2014 4:20 PM]

## 2014-05-09 NOTE — Progress Notes (Signed)
Upon entering, patient was noted to be crying.  When I asked her what was bothering her, she put her hands up and said "all this."  I gave the patient something for pain and gave her some reassurance.  Will continue to monitor.  Brita Romp, RN

## 2014-05-09 NOTE — Discharge Instructions (Addendum)
Inpatient Rehab Discharge Instructions  Traci Mclaughlin Discharge date and time:  05/25/14  Activities/Precautions/ Functional Status: Activity: activity as tolerated Diet: cardiac diet Wound Care: Wash left leg with soap and water. Pat dry and apply stump shrinker daily. Cleanse ulcer on buttock with soap and water. Pat dry and apply Santyl and damp to dry dressing.   Functional status:  ___ No restrictions     ___ Walk up steps independently _X__ 24/7 supervision/assistance   ___ Walk up steps with assistance ___ Intermittent supervision/assistance  ___ Bathe/dress independently ___ Walk with walker     _X__ Bathe/dress with assistance ___ Walk Independently    ___ Shower independently ___ Walk with assistance    ___ Shower with assistance _X__ No alcohol     ___ Return to work/school ________  COMMUNITY REFERRALS UPON DISCHARGE:   Home Health:   PT     OT     ST    RN  Agency:  [facility] Phone:  [phone] Medical Equipment/Items Ordered: 18x16 wheelchair with 18x16 basic cushion  Agency/Supplier:  [employer]       Phone:  [phone]  GENERAL COMMUNITY RESOURCES FOR PATIENT/FAMILY: Support Groups:  [place] [facility]     See brochure given to you   Special Instructions: 1. Boost every 30  Minutes when in chair. Try to lie on the side when in bed.   2. Contact Dr. Scot Dock if you develop any redness, pain, drainage from incision or Fever/chills.  3. Need to stay on bowel program so that you do not strain and have a BM daily. Constipation will cause recurrent rectal bleeding.   My questions have been answered and I understand these instructions. I will adhere to these goals and the provided educational materials after my discharge from the hospital.  Patient/Caregiver Signature _______________________________ Date __________  Clinician Signature _______________________________________ Date __________  Please bring this form and your medication list  with you to all your follow-up doctor's appointments.

## 2014-05-09 NOTE — Evaluation (Signed)
Occupational Therapy Assessment and Plan  Patient Details  Name: Traci Mclaughlin MRN: [id number] Date of Birth: [birth year]  OT Diagnosis: acute pain Rehab Potential: Rehab Potential: Good ELOS: 7-10 days   Today's Date: 05/09/2014 OT Individual Time: 0800-0900 OT Individual Time Calculation (min): 60 min     Problem List:  Patient Active Problem List   Diagnosis Date Noted  . Unilateral complete AKA 05/08/2014  . CAD (coronary atherosclerotic disease) 05/02/2014  . Preop cardiovascular exam 05/02/2014  . RBBB (right bundle branch block) 05/02/2014  . 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 History of DVT (deep vein thrombosis) 05/02/2014  . Ulcer of left lower leg 04/30/2014  . Leukocytosis 04/26/2014  . Pain in joint, ankle and foot 04/19/2014  . Depression 04/19/2014  . PAD (peripheral artery disease) 04/10/2014  . Left leg pain 03/31/2014  . Elevated troponin 03/11/2014  . 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 Right hemiplegia 03/07/2014  . Dyslipidemia 03/07/2014  . Stroke 03/05/2014  . CKD (chronic kidney disease) stage 3, GFR 30-59 ml/min 03/05/2014  . Hypertension 03/05/2014  . CVA (cerebral infarction) 03/05/2014  . Elevation of cardiac enzymes 07/06/2011  . Cervical cancer 07/06/2011  . Viral gastroenteritis 07/06/2011  . Acute kidney injury 07/06/2011    Past Medical History:  Past Medical History  Diagnosis Date  . Hypertension   . HOH (hard of hearing)   . CVA (cerebral infarction)   . PAD (peripheral artery disease) 04/10/2014  . Cancer     cervical; was on chemo; has been in remission for 84yr  . Depression 04/19/2014  . Hx of cardiovascular stress test     Nuclear (9/15):  Inf ischemia, EF 35%; high risk  . Stroke Aug. 10, 2015  . STEMI (ST elevation myocardial infarction) 06/2011  . RBBB (right bundle branch block) 05/02/2014  . Chronic kidney disease     daughter says no,they checked them,okay   Past Surgical History:   Past Surgical History  Procedure Laterality Date  . Cardiac catheterization  07/03/2011    Dr. VIrish Lack . Tee without cardioversion N/A 03/08/2014    Procedure: TRANSESOPHAGEAL ECHOCARDIOGRAM (TEE);  Surgeon: PFay Records MD;  [facility];  Service: Cardiovascular;  Laterality: N/A;  . 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 Amputation Left 05/04/2014    Procedure: AMPUTATION ABOVE KNEE;  Surgeon: CAngelia Mould MD;  [facility];  Service: Vascular;  Laterality: Left;    Assessment & Plan Clinical Impression: Patient is a 63y.o. 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 Patient well known to CIR from prior stay. She developed left toe ulcers as well as left knee wound with infection and worsening of pain. She was admitted on 04/30/14 for pain control as well as IV antibiotics for treatment of infection. 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 She was cleared for surgery by Dr. CBurt Knackand underwent L-AKA by Dr. DScot Dockon 05/04/14. PT/OT evaluations done and CIR recommended for follow up therapy.  Patient transferred to CIR on 05/08/2014 .    Patient currently requires mod with basic self-care skills and basic mobility secondary to muscle weakness, decreased cardiorespiratoy endurance, decreased problem solving and decreased safety awareness and decreased standing balance,  decreased postural control, hemiplegia and decreased balance strategies.  Prior to hospitalization, patient could complete ADL with mod.  Patient will benefit from skilled intervention to decrease level of assist with basic self-care skills and increase independence with basic self-care skills prior to discharge home with care  partner.  Anticipate patient will require intermittent supervision and follow up home health.  OT - End of Session Activity Tolerance: Decreased this session Endurance Deficit: Yes OT Assessment Rehab Potential: Good OT Patient demonstrates impairments in the following area(s): Balance;Cognition;Motor;Pain;Safety OT Basic ADL's Functional Problem(s): Grooming;Bathing;Toileting;Dressing OT Transfers Functional Problem(s): Toilet;Tub/Shower OT Additional Impairment(s): Fuctional Use of Upper Extremity OT Plan OT Intensity: Minimum of 1-2 x/day, 45 to 90 minutes OT Frequency: 5 out of 7 days OT Duration/Estimated Length of Stay: 7-10 days OT Treatment/Interventions: Balance/vestibular training;Cognitive remediation/compensation;Community reintegration;Disease mangement/prevention;DME/adaptive equipment instruction;Psychosocial support;Patient/family education;Therapeutic Exercise;UE/LE Strength taining/ROM;Therapeutic Activities;Neuromuscular re-education;Pain management;Functional mobility training;Skin care/wound managment;Self Care/advanced ADL retraining;UE/LE Coordination activities;Visual/perceptual remediation/compensation OT Self Feeding Anticipated Outcome(s): no goal set OT Basic Self-Care Anticipated Outcome(s): mod I  OT Toileting Anticipated Outcome(s): mod I  OT Bathroom Transfers Anticipated Outcome(s): mod I  OT Recommendation Patient destination: Home Follow Up Recommendations: Home health OT Equipment Recommended: To be determined   Skilled Therapeutic Intervention OT eval initiated with OT purpose, role and goals discussed. Self care retraining at sink level. Used white board for communication due to significant hearing loss. Focus on bed mobility, stand pivot transfers with RW bed to w/c, standing balance, activity tolerance, endurance etc. Pt required more than reasonable amt of time for all aspects of mobility and demonstrates liability throughout session. No clothes at  time of eval.  OT Evaluation Precautions/Restrictions  Precautions Precautions: Fall Precaution Comments: LLE AKA-HOH- needs written communication Restrictions Weight Bearing Restrictions: Yes LLE Weight Bearing: Non weight bearing (to stump) General Chart Reviewed: Yes Family/Caregiver Present: No Vital Signs Therapy Vitals Temp: 98.6 F (37 C) Temp Source: Oral Pulse Rate: 97 Resp: 18 BP: 146/77 mmHg Patient Position (if appropriate): Lying Oxygen Therapy SpO2: 99 % O2 Device: None (Room air) Pain Pain Assessment Pain Assessment: Faces Pain Score: 8  Faces Pain Scale: Hurts a little bit Pain Type: Acute pain Pain Location: Leg Pain Orientation: Left Pain Descriptors / Indicators: Throbbing Pain Frequency: Constant Pain Onset: On-going Patients Stated Pain Goal: 4 Pain Intervention(s): Medication (See eMAR) Home Living/Prior Functioning Home Living Available Help at Discharge: Family;Friend(s) Type of Home: House Home Access: Stairs to enter Home Layout: Two level;Able to live on main level with bedroom/bathroom  Lives With: Daughter ADL  see FIM Vision/Perception  Vision- History Baseline Vision/History: Wears glasses Wears Glasses: At all times Vision- Assessment Additional Comments: unsure what has changed since CVA  Cognition Overall Cognitive Status: No family/caregiver present to determine baseline cognitive functioning Arousal/Alertness: Awake/alert Orientation Level: Oriented X4 Attention: Alternating Sustained Attention: Appears intact Selective Attention: Appears intact Memory: Impaired Memory Impairment: Decreased recall of new information Awareness: Impaired Awareness Impairment: Anticipatory impairment Safety/Judgment: Appears intact Sensation Sensation Light Touch: Appears Intact Stereognosis: Appears Intact Hot/Cold: Appears Intact Proprioception: Appears Intact Motor  Motor Motor - Skilled Clinical Observations: generalized  weakness, miild old right hemiparesis  Mobility  Bed Mobility Supine to Sit: 5: Supervision Transfers Sit to Stand: 4: Min assist Stand to Sit: 4: Min assist  Trunk/Postural Assessment  Cervical Assessment Cervical Assessment: Within Functional Limits Thoracic Assessment Thoracic Assessment: Within Functional Limits Postural Control Protective Responses: delayed  Balance Static Sitting Balance Static Sitting - Balance Support: Feet supported;No upper extremity supported Static  Sitting - Level of Assistance: 5: Stand by assistance Static Standing Balance Static Standing - Balance Support: Bilateral upper extremity supported;During functional activity Static Standing - Level of Assistance: 4: Min assist Extremity/Trunk Assessment RUE AROM (degrees) RUE Overall AROM Comments: WFL RUE PROM (degrees) RUE Overall PROM Comments: WFL LUE Assessment LUE Assessment: Within Functional Limits  FIM:  FIM - Grooming Grooming Steps: Wash, rinse, dry face;Wash, rinse, dry hands;Oral care, brush teeth, clean dentures;Brush, comb hair Grooming: 5: Supervision: safety issues or verbal cues FIM - Bathing Bathing Steps Patient Completed: Chest;Right Arm;Left Arm;Abdomen;Front perineal area;Right upper leg;Left upper leg Bathing: 4: Min-Patient completes 8-9 [id number] parts or 75+ percent FIM - Upper Body Dressing/Undressing Upper body dressing/undressing: 0: Wears gown/pajamas-no public clothing FIM - Lower Body Dressing/Undressing Lower body dressing/undressing: 0: Wears gInterior and spatial designerFIM - BIT sales professionalTransfer: 5: Supine > Sit: Supervision (verbal cues/safety issues);4: Bed > Chair or W/C: Min A (steadying Pt. > 75%)   Refer to Care Plan for Long Term Goals  Recommendations for other services: None  Discharge Criteria: Patient will be discharged from OT if patient refuses treatment 3 consecutive times without medical reason, if treatment goals not met, if  there is a change in medical status, if patient makes no progress towards goals or if patient is discharged from hospital.  The above assessment, treatment plan, treatment alternatives and goals were discussed and mutually agreed upon: by patient  SNicoletta Ba10/14/2015, 8:58 AM

## 2014-05-10 ENCOUNTER — Inpatient Hospital Stay (HOSPITAL_COMMUNITY): Payer: Medicare Other | Admitting: Physical Therapy

## 2014-05-10 ENCOUNTER — Inpatient Hospital Stay (HOSPITAL_COMMUNITY): Payer: Medicare Other

## 2014-05-10 DIAGNOSIS — L97929 Non-pressure chronic ulcer of unspecified part of left lower leg with unspecified severity: Secondary | ICD-10-CM

## 2014-05-10 DIAGNOSIS — I420 Dilated cardiomyopathy: Secondary | ICD-10-CM

## 2014-05-10 DIAGNOSIS — N183 Chronic kidney disease, stage 3 (moderate): Secondary | ICD-10-CM

## 2014-05-10 LAB — URINE CULTURE: Colony Count: 4000

## 2014-05-10 MED ORDER — INFLUENZA VAC SPLIT QUAD 0.5 ML IM SUSY
0.5000 mL | PREFILLED_SYRINGE | Freq: Once | INTRAMUSCULAR | Status: DC
  Filled 2014-05-10: qty 0.5

## 2014-05-10 NOTE — IPOC Note (Addendum)
Overall Plan of Care [facility]) Patient Details Name: Traci Mclaughlin MRN: [id number] DOB: [birth year]  Admitting Diagnosis: l aka deaf  Hospital Problems: Active Problems:   Unilateral complete AKA     Functional Problem List: Nursing Safety;Pain;Medication Management;Endurance;Skin Integrity;Motor;Nutrition;Perception  PT Warehouse manager;Behavior;Edema;Endurance;Motor;Pain;Perception;Safety  OT Balance;Cognition;Motor;Pain;Safety  SLP    TR         Basic ADL's: OT Grooming;Bathing;Toileting;Dressing     Advanced  ADL's: OT       Transfers: PT Bed Mobility;Bed to Chair;Car;Furniture  OT Toilet;Tub/Shower     Locomotion: PT Ambulation;Wheelchair Mobility;Stairs     Additional Impairments: OT Fuctional Use of Upper Extremity  SLP        TR      Anticipated Outcomes Item Anticipated Outcome  Self Feeding no goal set  Swallowing      Basic self-care  mod I   Toileting  mod I    Bathroom Transfers mod I   Bowel/Bladder     Transfers  Mod I  Locomotion  supervision short distances  Communication     Cognition     Pain  pain goal less than 2.able to participate in therapy, and self care  Safety/Judgment  demonstrates  appropriate safety  awareness   Therapy Plan: PT Intensity: Minimum of 1-2 x/day ,45 to 90 minutes PT Frequency: 5 out of 7 days PT Duration Estimated Length of Stay: 7-10 days OT Intensity: Minimum of 1-2 x/day, 45 to 90 minutes OT Frequency: 5 out of 7 days OT Duration/Estimated Length of Stay: 7-10 days         Team Interventions: Nursing Interventions Patient/Family Education;Pain Management;Medication Management;Disease Management/Prevention;Skin Care/Wound Management;Discharge Planning;Psychosocial Support;Other (comment) (communication [place])  PT interventions Ambulation/gait training;Discharge planning;Functional mobility training;Psychosocial support;Therapeutic Activities;Visual/perceptual  remediation/compensation;Balance/vestibular training;Disease management/prevention;Neuromuscular re-education;Skin care/wound management;Therapeutic Exercise;Wheelchair propulsion/positioning;Cognitive remediation/compensation;DME/adaptive equipment instruction;Pain management;Splinting/orthotics;UE/LE Strength taining/ROM;Community reintegration;Patient/family education;Stair training;UE/LE Coordination activities  OT Interventions Balance/vestibular training;Cognitive remediation/compensation;Community reintegration;Disease mangement/prevention;DME/adaptive equipment instruction;Psychosocial support;Patient/family education;Therapeutic Exercise;UE/LE Strength taining/ROM;Therapeutic Activities;Neuromuscular re-education;Pain management;Functional mobility training;Skin care/wound managment;Self Care/advanced ADL retraining;UE/LE Coordination activities;Visual/perceptual remediation/compensation  SLP Interventions    TR Interventions    SW/CM Interventions Discharge Planning;Psychosocial Support;Patient/Family Education    Team Discharge Planning: Destination: PT-Home ,OT- Home , SLP-  Projected Follow-up: PT-24 hour supervision/assistance;Home health PT, OT-  Home health OT, SLP-  Projected Equipment Needs: PT-To be determined;Wheelchair cushion (measurements);Wheelchair (measurements), OT- To be determined, SLP-  Equipment Details: PT-tbd, OT-  Patient/family involved in discharge planning: PT- Patient,  OT- Patient, SLP-   MD ELOS: 10-14 days         Medical Rehab Prognosis:  Good Assessment: 63 y.o. 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 Patient well known to CIR from prior stay. She developed left toe ulcers as well as left knee wound with infection and worsening of pain. She was admitted on 04/30/14 for pain control as well as IV antibiotics for treatment of  infection. 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 She was cleared for surgery by Dr. Burt Knack and underwent L-AKA by Dr. Scot Dock on 05/04/14  Now requiring 24/7 Rehab RN,MD, as well as CIR level PT, OT and SLP.  Treatment team will focus on ADLs and mobility with goals set at Sup/Mod I     See Team Conference Notes for weekly updates to the plan of care

## 2014-05-10 NOTE — Progress Notes (Signed)
Physical Therapy Session Note  Patient Details  Name: Traci Mclaughlin MRN: [id number] Date of Birth: Dec 30, 1950  Today's Date: 05/10/2014 PT Individual Time: 0830-0930 Tx 2: 1500-1530 PT Individual Time Calculation (min): 60 min Tx 2: 30 minutes  Short Term Goals: Week 1:  PT Short Term Goal 1 (Week 1): =LTGs due to estimated LOS  Skilled Therapeutic Interventions/Progress Updates:    Therapeutic Exercise: PT instructs pt in AROM and strengthening exercises to L residual limb: SLR, side lie hip abduction, side lie hip extension: 3 x 10 reps each  Therapeutic Activity: PT instructs pt in rolling R in bed req SBA and visual cues for encouragement, supine to sit req SBA, and sit to stand with RW req CGA and tactile and visual cues for hand placement, CGA stand-pivot/wiggle foot transfer bed to w/c.   Gait Training: PT instructs pt in ambulation with RW x 15' req CGA and visual cues to hop and encouragement for this distance.   W/C Management: PT instructs pt in self propelling manual w/c x 20' req visual cues to increase R arm stroke and SBA.   Tx 2: Gait Training: PT instructs pt in ambulation with RW x 20' req CGA and with standard walker x 20' req CGA.  PT instructs pt in ascending/descending 1 low 2 inch step with standard walker req CGA-min A after demonstration.   Therapeutic Activity: Pt demonstrates mod I supine to sit transfers. Pt demonstrates CGA-SBA sit to stand with RW from bed and SBA stand-pivot transfer w/c to bed. Pt req CGA-min A for sit to stand from w/c due to assist req to stabilize walker.   Pt requires significant encouragement to participate in any aspect of physical therapy AM session. PT used whiteboard for written communication throughout treatments - pt is very [place]. Pt tearful throughout session, but PT gave pt time to cope with emotions and waited for pt to participate and with visual encouragement she eventually did. During PM session, pt has initiated  gentle rubbing/massaging L leg to desensitize it and reduce pain. Pt was smiling and participation much improved during PM session. Pt needs further work on sit to stand training without someone stabilizing walker. Continue per PT POC.   Therapy Documentation Precautions:  Precautions Precautions: Fall Precaution Comments: LLE AKA-HOH- needs written communication Restrictions Weight Bearing Restrictions: Yes LLE Weight Bearing: Non weight bearing (to stump) Other Position/Activity Restrictions: wears Darco sandal to unweight L toes Pain: Pain Assessment Pain Assessment: 0-10 Pain Score: 9  Faces Pain Scale: No hurt Pain Type: Phantom pain Pain Location: Leg Pain Orientation: Left Pain Descriptors / Indicators: Throbbing Pain Frequency: Intermittent Pain Onset: On-going Patients Stated Pain Goal: 4 Pain Intervention(s): Rest Multiple Pain Sites: No Tx 2: Pt c/o 8/10 pain in L residual limb and PT instructs her in gently rubbing/massaging L leg to desensitize it and reduce her pain. Pt complies.   See FIM for current functional status  Therapy/Group: Individual Therapy  Markey Deady M 05/10/2014, 8:49 AM

## 2014-05-10 NOTE — Progress Notes (Signed)
Subjective/Complaints: 63 y.o. 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 Patient well known to CIR from prior stay. She developed left toe ulcers as well as left knee wound with infection and worsening of pain. She was admitted on 04/30/14 for pain control as well as IV antibiotics for treatment of infection. 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 She was cleared for surgery by Dr. Burt Knack and underwent L-AKA by Dr. Scot Dock on 05/04/14.  No issues overnite  Review of Systems - limited by hearing loss and aphasia   Objective: Vital Signs: Blood pressure 155/76, pulse 98, temperature 98.9 F (37.2 C), temperature source Oral, resp. rate 18, weight 73.5 kg (162 lb 0.6 oz), SpO2 97.00%. No results found. Results for orders placed during the hospital encounter of 05/08/14 (from the past 72 hour(s))  URINALYSIS, ROUTINE W REFLEX MICROSCOPIC     Status: None   Collection Time    05/09/14  6:07 AM      Result Value Ref Range   Color, Urine YELLOW  YELLOW   APPearance CLEAR  CLEAR   Specific Gravity, Urine 1.011  1.005 - 1.030   pH 5.5  5.0 - 8.0   Glucose, UA NEGATIVE  NEGATIVE mg/dL   Hgb urine dipstick NEGATIVE  NEGATIVE   Bilirubin Urine NEGATIVE  NEGATIVE   Ketones, ur NEGATIVE  NEGATIVE mg/dL   Protein, ur NEGATIVE  NEGATIVE mg/dL   Urobilinogen, UA 0.2  0.0 - 1.0 mg/dL   Nitrite NEGATIVE  NEGATIVE   Leukocytes, UA NEGATIVE  NEGATIVE   Comment: MICROSCOPIC NOT DONE ON URINES WITH NEGATIVE PROTEIN, BLOOD, LEUKOCYTES, NITRITE, OR GLUCOSE <1000 mg/dL.  CBC WITH DIFFERENTIAL     Status: Abnormal   Collection Time    05/09/14  6:50 AM      Result Value Ref Range   WBC 8.9  4.0 - 10.5 K/uL   RBC 2.87 (*) 3.87 - 5.11 MIL/uL   Hemoglobin 9.0 (*) 12.0 - 15.0 g/dL   HCT 27.0 (*) 36.0  - 46.0 %   MCV 94.1  78.0 - 100.0 fL   MCH 31.4  26.0 - 34.0 pg   MCHC 33.3  30.0 - 36.0 g/dL   RDW 13.0  11.5 - 15.5 %   Platelets 423 (*) 150 - 400 K/uL   Neutrophils Relative % 68  43 - 77 %   Neutro Abs 6.0  1.7 - 7.7 K/uL   Lymphocytes Relative 18  12 - 46 %   Lymphs Abs 1.6  0.7 - 4.0 K/uL   Monocytes Relative 11  3 - 12 %   Monocytes Absolute 1.0  0.1 - 1.0 K/uL   Eosinophils Relative 3  0 - 5 %   Eosinophils Absolute 0.3  0.0 - 0.7 K/uL   Basophils Relative 0  0 - 1 %   Basophils Absolute 0.0  0.0 - 0.1 K/uL  COMPREHENSIVE METABOLIC PANEL     Status: Abnormal   Collection Time    05/09/14  6:50 AM      Result Value Ref Range   Sodium 141  137 - 147 mEq/L   Potassium 3.5 (*) 3.7 - 5.3 mEq/L   Chloride 101  96 - 112 mEq/L   CO2 26  19 - 32 mEq/L   Glucose, Bld 102 (*) 70 - 99 mg/dL  BUN 10  6 - 23 mg/dL   Creatinine, Ser 0.95  0.50 - 1.10 mg/dL   Calcium 9.4  8.4 - 10.5 mg/dL   Total Protein 6.8  6.0 - 8.3 g/dL   Albumin 2.3 (*) 3.5 - 5.2 g/dL   AST 39 (*) 0 - 37 U/L   ALT 40 (*) 0 - 35 U/L   Alkaline Phosphatase 55  39 - 117 U/L   Total Bilirubin 0.3  0.3 - 1.2 mg/dL   GFR calc non Af Amer 62 (*) >90 mL/min   GFR calc Af Amer 72 (*) >90 mL/min   Comment: (NOTE)     The eGFR has been calculated using the CKD EPI equation.     This calculation has not been validated in all clinical situations.     eGFR's persistently <90 mL/min signify possible Chronic Kidney     Disease.   Anion gap 14  5 - 15     HEENT: poor dentition Cardio: RRR and no murmur Resp: CTA B/L and unlabored GI: BS positive and NT, ND Extremity:  Pulses positive and No Edema. Right foot Skin:   Intact and Wound C/D/I and Left AKA incision without tenderness, Right foot without lesion Neuro: Flat, Cranial Nerve II-XII normal, Abnormal Motor 4/5 in RUE, 3- R HF, 4/5 R KE, ADF, 5/5 LUE, 3/5 L HF, Abnormal FMC Ataxic/ dec FMC and Dysarthric Musc/Skel:  Swelling Left thigh no erythema Gen  NAD   Assessment/Plan: 1. 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 Physiatrist is providing close team supervision and 24 hour management of active medical problems listed below. Physiatrist and rehab team continue to assess barriers to discharge/monitor patient progress toward functional and medical goals. FIM: FIM - Bathing Bathing Steps Patient Completed: Chest;Right Arm;Left Arm;Abdomen;Front perineal area;Right upper leg;Left upper leg Bathing: 4: Min-Patient completes 8-9 86f10 parts or 75+ percent  FIM - Upper Body Dressing/Undressing Upper body dressing/undressing: 0: Wears gown/pajamas-no public clothing FIM - Lower Body Dressing/Undressing Lower body dressing/undressing: 0: Wears gInterior and spatial designer       FIM - BControl and instrumentation engineerDevices: WEnvironmental consultantArm rests Bed/Chair Transfer: 4: Supine > Sit: Min A (steadying Pt. > 75%/lift 1 leg);5: Sit > Supine: Supervision (verbal cues/safety issues);4: Bed > Chair or W/C: Min A (steadying Pt. > 75%);4: Chair or W/C > Bed: Min A (steadying Pt. > 75%)  FIM - Locomotion: Wheelchair Distance: 25 Locomotion: Wheelchair: 1: Travels less than 50 ft with moderate assistance (Pt: 50 - 74%) FIM - Locomotion: Ambulation Locomotion: Ambulation Assistive Devices: WAdministratorAmbulation/Gait Assistance: 4: Min guard Locomotion: Ambulation: 1: Travels less than 50 ft with minimal assistance (Pt.>75%)  Comprehension Comprehension Mode: Visual Comprehension: 5-Understands complex 90% of the time/Cues < 10% of the time  Expression Expression Mode: Nonverbal Expression Assistive Devices: 6-Communication board Expression: 5-Expresses basic 90% of the time/requires cueing < 10% of the time.  Social Interaction Social Interaction Mode: Asleep Social Interaction: 4-Interacts appropriately  75 - 89% of the time - Needs redirection for appropriate language or to initiate interaction.  Problem Solving Problem Solving: 5-Solves basic 90% of the time/requires cueing < 10% of the time  Memory Memory: 4-Recognizes or recalls 75 - 89% of the time/requires cueing 10 - 24% of the time  Medical Problem List and Plan:  1. 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  2. LLE  DVT/Anticoagulation: Pharmaceutical: Xarelto  3. Pain Management: Will start patient on OxyContin 10 mg bid and continue oxycodone prn.  -observe mental status closely  4. Mood: Continue cymbalta for pain as well as mood stabilization. Team to provide ego support and encouragement to help with participation. LCSW to follow for evaluation and support.  5. Neuropsych: This patient is not capable of making decisions on her own behalf.  6. Skin/Wound Care: Monitor wound daily for healing.  7. Fluids/Electrolytes/Nutrition: Monitor intake for adequate hydration and nutritional status.  8. HTN: Will monitor every 8 hours. Continue Coreg and Altace.  9. CAD: Monitor for symptoms with recommendations to add nitrate if needed. Continue coreg bid, Altace and Lipitor.  10. Recent L-MCA infarct residual mild R HP: on Xarelto.  11. Hypokalemia: resolved past supplementation. Will recheck in am.  12. Leucocytosis: Resolving. Monitor for signs of infection. Monitor temperature curve. Off antibiotics. Recheck in am.  13. Acute blood loss anemia: Will check CBC for follow up in am.    LOS (Days) 2 A FACE TO FACE EVALUATION WAS PERFORMED  Traci Mclaughlin 05/10/2014, 7:51 AM

## 2014-05-10 NOTE — Progress Notes (Signed)
Occupational Therapy Session Note  Patient Details  Name: Traci Mclaughlin MRN: [id number] Date of Birth: July 17, 1951  Today's Date: 05/10/2014 OT Individual Time: 0930-1030 OT Individual Time Calculation (min): 60 min    Short Term Goals: Week 1:  OT Short Term Goal 1 (Week 1): LTG=STG due to short LOS  Skilled Therapeutic Interventions/Progress Updates: ADL-retraining with focus on functional transfers, adapted bathing using DME, functional communication, and pt ed on desensitization of left LE residual limb.   Using communication board (white board w/dry erase marker), OT advised need for showering to which pt agreed although refusing female supervision.   RN tech was contacted and available for assist during this session, after OT instructed pt on transfer from w/c to shower.   Pt completed transfer with mod assist (lifting, pt = 70%) and performed seated shower, although rising to wash peri-area, per Engineer, manufacturing.   Pt required extra time to bathe with min assist to reach right foot.    Pt dressed in hospital gown d/t no clothing available although stating that her daughter would bring clothes (unknown day/time).   Pt was then escorted to tub room for transfer to bench on standard tub but she declined training and clarified that she had a roll-in shower similar to shower in her CIR room.   Pt became labile for unknown reason and requested return to her room and transfer to bed for rest.   OT provided escort to pt's room and assist with transfer to bed (max assist) and mobility.   While repositioning in bed pt became anxious and stated she felt ill "all over."  OT alerted RN to care need an arrived for monitoring as session ended.     Therapy Documentation Precautions:  Precautions Precautions: Fall Precaution Comments: LLE AKA-HOH- needs written communication Restrictions Weight Bearing Restrictions: Yes LLE Weight Bearing: Non weight bearing (to stump)   Pain: Pain Assessment Pain  Assessment: 0-10 Pain Score: 9  Faces Pain Scale: Hurts whole lot Pain Type: Phantom pain Pain Location: Leg Pain Orientation: Left Pain Descriptors / Indicators: Throbbing Pain Onset: On-going Pain Intervention(s): Medication (See eMAR) Multiple Pain Sites: No  See FIM for current functional status  Therapy/Group: Individual Therapy  Second session: Time: 1330-1400 Time Calculation (min):  30 min  Pain Assessment: No/denies pain  Skilled Therapeutic Interventions: ADL-retraining with emphasis on family ed with pt's daughter on tub bench transfer using standard tub bench.  Per daughter, pt will use standard tub at her home while she recovers until she moves back to her own residence.   With daughter present and patient now fully dressed, pt completed stand pivot transfer from/to w/c to/from tub bench with mod (lifting assist, pt = 80%).   Pt required extra time and will require reinforcement training to improve safety of transfers for foot placement while performing slide step to bench or w/c.  Per daughter, pt's lability is chronic and can be redirected to focus on task.   Pt returned to room and requested to remain in w/c while awaiting physical therapy appt at 3 pm.  See FIM for current functional status  Therapy/Group: Individual Therapy  [place] 05/10/2014, 10:41 AM

## 2014-05-10 NOTE — Progress Notes (Signed)
[facility] Individual Statement of Services  Patient Name:  [name]  Date:  05/10/2014  Welcome to [facility].  Our goal is to provide you with an individualized program based on your diagnosis and situation, designed to meet your specific needs.  With this comprehensive rehabilitation program, you will be expected to participate in at least 3 hours of rehabilitation therapies Monday-Friday, with modified therapy programming on the weekends.  Your rehabilitation program will include the following services:  Physical Therapy (PT), Occupational Therapy (OT), Speech Therapy (ST), 24 hour per day rehabilitation nursing, Neuropsychology, Case Management (Social Worker), Rehabilitation Medicine, Nutrition Services and Pharmacy Services  Weekly team conferences will be held on Tuesdays to discuss your progress.  Your Social Worker will talk with you frequently to get your input and to update you on team discussions.  Team conferences with you and your family in attendance may also be held.  Expected length of stay:  7 to 10 days  Overall anticipated outcome:  Modified independent with supervision for short ambulation distances  Depending on your progress and recovery, your program may change. Your Social Worker will coordinate services and will keep you informed of any changes. Your Social Worker's name and contact numbers are listed  below.  The following services may also be recommended but are not provided by the [facility] will be made to provide these services after discharge if needed.  Arrangements include referral to agencies that provide these services.  Your insurance has been verified to be:  [employer] Your primary doctor is:  [name], [place]  Pertinent information will be shared  with your doctor and your insurance company.  Social Worker:  Alfonse Alpers, LCSW  [phone] or ([phone]  Information discussed with and copy given to patient by: Trey Sailors, 05/10/2014, 11:06 PM

## 2014-05-10 NOTE — Progress Notes (Signed)
   VASCULAR SURGERY ASSESSMENT & PLAN:  * POD 5 Left AKA  *  Doing well in Rehab.  SUBJECTIVE: Comfortable  PHYSICAL EXAM: Filed Vitals:   05/09/14 0623 05/09/14 1400 05/09/14 2052 05/10/14 0522  BP: 146/77 110/65 133/55 155/76  Pulse: 97 85 81 98  Temp: 98.6 F (37 C) 98.8 F (37.1 C) 98.5 F (36.9 C) 98.9 F (37.2 C)  TempSrc: Oral Oral Oral Oral  Resp: 18 17 18 18   Weight:      SpO2: 99% 97% 98% 97%   Dressing on L AKA is dry. Stump well perfused.   LABS: Lab Results  Component Value Date   WBC 8.9 05/09/2014   HGB 9.0* 05/09/2014   HCT 27.0* 05/09/2014   MCV 94.1 05/09/2014   PLT 423* 05/09/2014   Lab Results  Component Value Date   CREATININE 0.95 05/09/2014   Lab Results  Component Value Date   INR 1.38 05/03/2014   Active Problems:   Unilateral complete AKA   [name] Beeper: [phone] 05/10/2014

## 2014-05-10 NOTE — Progress Notes (Signed)
Patient information reviewed and entered into eRehab system by Jaquil Todt, RN, CRRN, PPS Coordinator.  Information including medical coding and functional independence measure will be reviewed and updated through discharge.     Per nursing patient was given "Data Collection Information Summary for Patients in Inpatient Rehabilitation Facilities with attached "Privacy Act Statement-Health Care Records" upon admission.  

## 2014-05-10 NOTE — Progress Notes (Signed)
Assisted putting on stump shrinker with Gerald Stabs.  Patient tolerated fair. She cried out in pain initially but was able to get comfortable after a few minutes with it on.  Informed patient via whiteboard that the shrinker needs to stay on for the majority of the day, with the exception of when the dressing needs to be changed underneath.  Patient verbalized understanding.  Brita Romp, RN

## 2014-05-10 NOTE — Progress Notes (Signed)
Performed dressing change, scant amount of drainage, staples intact, patient asked for me not to put the ace warp back on. Patient was tearful during dressing change, stated that pain is along the incision line.  Oval Linsey, RN

## 2014-05-10 NOTE — Plan of Care (Signed)
Problem: RH PAIN MANAGEMENT Goal: RH STG PAIN MANAGED AT OR BELOW PT'S PAIN GOAL Pain goal less than 2  Outcome: Not Progressing In quite a bit of pain today after stump shrinker applied.

## 2014-05-11 ENCOUNTER — Inpatient Hospital Stay (HOSPITAL_COMMUNITY): Payer: Medicare Other

## 2014-05-11 ENCOUNTER — Inpatient Hospital Stay (HOSPITAL_COMMUNITY): Payer: Medicare Other | Admitting: Physical Therapy

## 2014-05-11 ENCOUNTER — Inpatient Hospital Stay (HOSPITAL_COMMUNITY): Payer: Medicare Other | Admitting: Occupational Therapy

## 2014-05-11 MED ORDER — TRAMADOL HCL 50 MG PO TABS
50.0000 mg | ORAL_TABLET | Freq: Four times a day (QID) | ORAL | Status: DC
  Administered 2014-05-11 – 2014-05-16 (×16): 50 mg via ORAL
  Filled 2014-05-11 (×19): qty 1

## 2014-05-11 MED ORDER — OXYCODONE HCL ER 15 MG PO T12A
15.0000 mg | EXTENDED_RELEASE_TABLET | Freq: Two times a day (BID) | ORAL | Status: DC
  Administered 2014-05-11 – 2014-05-14 (×7): 15 mg via ORAL
  Filled 2014-05-11 (×7): qty 1

## 2014-05-11 MED ORDER — DULOXETINE HCL 20 MG PO CPEP
40.0000 mg | ORAL_CAPSULE | Freq: Every day | ORAL | Status: DC
  Administered 2014-05-12 – 2014-05-25 (×13): 40 mg via ORAL
  Filled 2014-05-11 (×16): qty 2

## 2014-05-11 NOTE — Progress Notes (Signed)
Social Work  Assessment and Plan  Patient Details  Name: Sophonie Goforth MRN: [id number] Date of Birth: Dec 10, 1950  Today's Date: 05/11/2014  Problem List:  Patient Active Problem List   Diagnosis Date Noted  . Unilateral complete AKA 05/08/2014  . CAD (coronary atherosclerotic disease) 05/02/2014  . Preop cardiovascular exam 05/02/2014  . RBBB (right bundle branch block) 05/02/2014  . 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 History of DVT (deep vein thrombosis) 05/02/2014  . Ulcer of left lower leg 04/30/2014  . Leukocytosis 04/26/2014  . Pain in joint, ankle and foot 04/19/2014  . Depression 04/19/2014  . PAD (peripheral artery disease) 04/10/2014  . Left leg pain 03/31/2014  . Elevated troponin 03/11/2014  . 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 Right hemiplegia 03/07/2014  . Dyslipidemia 03/07/2014  . Stroke 03/05/2014  . CKD (chronic kidney disease) stage 3, GFR 30-59 ml/min 03/05/2014  . Hypertension 03/05/2014  . CVA (cerebral infarction) 03/05/2014  . Elevation of cardiac enzymes 07/06/2011  . Cervical cancer 07/06/2011  . Viral gastroenteritis 07/06/2011  . Acute kidney injury 07/06/2011   Past Medical History:  Past Medical History  Diagnosis Date  . Hypertension   . HOH (hard of hearing)   . CVA (cerebral infarction)   . PAD (peripheral artery disease) 04/10/2014  . Cancer     cervical; was on chemo; has been in remission for 33yr  . Depression 04/19/2014  . Hx of cardiovascular stress test     Nuclear (9/15):  Inf ischemia, EF 35%; high risk  . Stroke Aug. 10, 2015  . STEMI (ST elevation myocardial infarction) 06/2011  . RBBB (right bundle branch block) 05/02/2014  . Chronic kidney disease     daughter says no,they checked them,okay   Past Surgical History:  Past Surgical History  Procedure Laterality Date  . Cardiac catheterization  07/03/2011    Dr. VIrish Lack . Tee without cardioversion N/A 03/08/2014    Procedure:  TRANSESOPHAGEAL ECHOCARDIOGRAM (TEE);  Surgeon: PFay Records MD;  [facility]  Service: Cardiovascular;  Laterality: N/A;  . 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 Amputation Left 05/04/2014    Procedure: AMPUTATION ABOVE KNEE;  Surgeon: CAngelia Mould MD;  [place]  Service: Vascular;  Laterality: Left;   Social History:  reports that she has quit smoking. Her smoking use included Cigarettes. She has a 1 pack-year smoking history. She quit smokeless tobacco use about 2 years ago. She reports that she drinks about 3 ounces of alcohol per week. She reports that she does not use illicit drugs.  Family / Support Systems Marital Status: Single Patient Roles: Parent Children: KCorey [phone] Supports: [name]-dtr [phone] Caregiver: KRoby LoftsAbility/Limitations of Caregiver: Dtr, dtr's boyfriend, and grandchildren Caregiver Availability: 24/7 Family Dynamics: Pt lives with KRoby Loftsand her son and his family.  They are close.  Pt's other dtr lives out of town and the two sisters do not get along.  KRoby Loftshad to call security the last time pt was on CIR.  Social History Preferred language: English Religion: None Education: high school Read: Yes Write: Yes Employment Status: Disabled Date Retired/Disabled/Unemployed: 2010 LPublic relations account executiveIssues: None reported Guardian/Conservator: None- pt is not able to make her own decisions at this time, per MD.  Dtr will provide input, in lieu of no formal POA.   Abuse/Neglect Physical Abuse: Denies Verbal Abuse: Denies Sexual Abuse:  Denies Exploitation of patient/patient's resources: Denies Self-Neglect: Denies  Emotional Status Pt's affect, behavior and adjustment status: Pt was tearful and refused CSW visit at first.  When her dtr came to visit, pt was more agreeable to [place] visiting.  Pt was trying to get used to  her stump shrinker and this was making her tearful.  Pt seemed to understand CSW when we communicated.  Pt was just in CIR in August for a stroke.  She really wants to get home.  She and dtr smiled and laughed and pt seemed in better spirits.  Recent Psychosocial Issues: Pt is frustrated to be back in the hospital. Psychiatric History: Dtr reports pt recently started medication for depression.  Neuropsych to follow up with pt for coping next week. Substance Abuse History: None reported  Patient / Family Perceptions, Expectations & Goals Pt/Family understanding of illness & functional limitations: Dtr has a good understanding of pt's condition and is already planning ahead for what pt is going to need such as a ramp.  Pt is ready to get home, so dtr explained to her she needs to work with the therapists in order to make that happen. 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, Grandmother, retiree, etc Anticipated changes in roles/activities/participation: Pt wants to resume as she is able. Pt/family expectations/goals: Pt wants to go home as soon as possible.  Community Resources [employer]: Other (Comment) (section 8, but pt is living with dtr) Premorbid Home Care/DME Agencies: Other (Comment) (Pt had [facility] [facility] after her last admission to CIR) Transportation available at discharge: family Resource referrals recommended: Neuropsychology;Support group (specify)  Discharge Planning Living Arrangements: Children Support Systems: Children;Other relatives Type of Residence: Private residence Insurance Resources: Multimedia programmer (specify) (Marine scientist) Financial Resources: SSD Financial Screen Referred: No Living Expenses: Lives with family Money Management: Patient Does the patient have any problems obtaining your medications?: No Home Management: pt and family Patient/Family Preliminary Plans: Pt plans to return to [place] home and has 24/7 care from her  family members. Barriers to Discharge: Steps Social Work Anticipated Follow Up Needs: HH/OP;Support Group Expected length of stay: 7-10 days  Clinical Impression CSW met with pt, her dtr [name]) and dtr's boyfriend to introduce self and role of CSW, as well as to complete assessment.  Pt was just in CIR in August post CVA.  She very much wants to get home and dtr encouraged her to continue to work with therapists so she can get home faster.  Dtr wants her to get as much therapy here because when she goes home, she tends to not do very much.  Pt has recently started an anti-depressant and dtr reports an improvement in pt already, but she worries about her lack of motivation and energy.  CSW will consult neuropsychology to assess pt.  Pt has a lot of DME at home.  Dtr is worried w/c will not fit through the doorways in the rental house.  She will email measurements for the therapists.  dtr's boyfriend and grandson will come to receive training on bumping pt up the stairs in the w/c.  Family is working on a ramp, as well, and CSW gave them resources for this.  Pt had [facility], but couldn't participate with PT due to leg pain.  Dtr hopes this will be better now and she can receive home therapies.  CSW will continue to follow and support and assist pt as needed.  Ramonita Koenig, Silvestre Mesi 05/11/2014, 12:55 PM

## 2014-05-11 NOTE — Progress Notes (Signed)
Subjective/Complaints: 63 y.o. 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 Patient well known to CIR from prior stay. She developed left toe ulcers as well as left knee wound with infection and worsening of pain. She was admitted on 04/30/14 for pain control as well as IV antibiotics for treatment of infection. 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 She was cleared for surgery by Dr. Burt Knack and underwent L-AKA by Dr. Scot Dock on 05/04/14.  No issues overnite except pain this am, has not kept up with oxy IR  Review of Systems - limited by hearing loss and aphasia   Objective: Vital Signs: Blood pressure 151/67, pulse 96, temperature 98.5 F (36.9 C), temperature source Oral, resp. rate 18, weight 73.5 kg (162 lb 0.6 oz), SpO2 97.00%. No results found. Results for orders placed during the hospital encounter of 05/08/14 (from the past 72 hour(s))  URINALYSIS, ROUTINE W REFLEX MICROSCOPIC     Status: None   Collection Time    05/09/14  6:07 AM      Result Value Ref Range   Color, Urine YELLOW  YELLOW   APPearance CLEAR  CLEAR   Specific Gravity, Urine 1.011  1.005 - 1.030   pH 5.5  5.0 - 8.0   Glucose, UA NEGATIVE  NEGATIVE mg/dL   Hgb urine dipstick NEGATIVE  NEGATIVE   Bilirubin Urine NEGATIVE  NEGATIVE   Ketones, ur NEGATIVE  NEGATIVE mg/dL   Protein, ur NEGATIVE  NEGATIVE mg/dL   Urobilinogen, UA 0.2  0.0 - 1.0 mg/dL   Nitrite NEGATIVE  NEGATIVE   Leukocytes, UA NEGATIVE  NEGATIVE   Comment: MICROSCOPIC NOT DONE ON URINES WITH NEGATIVE PROTEIN, BLOOD, LEUKOCYTES, NITRITE, OR GLUCOSE <1000 mg/dL.  URINE CULTURE     Status: None   Collection Time    05/09/14  6:07 AM      Result Value Ref Range   Specimen Description URINE, CLEAN CATCH     Special Requests NONE     Culture   Setup Time       Value: 05/09/2014 12:58     Performed at [facility] Count       Value: 4,000 COLONIES/ML     Performed at [facility]   Culture       Value: INSIGNIFICANT GROWTH     Performed at [facility]   Report Status 05/10/2014 FINAL    CBC WITH DIFFERENTIAL     Status: Abnormal   Collection Time    05/09/14  6:50 AM      Result Value Ref Range   WBC 8.9  4.0 - 10.5 K/uL   RBC 2.87 (*) 3.87 - 5.11 MIL/uL   Hemoglobin 9.0 (*) 12.0 - 15.0 g/dL   HCT 27.0 (*) 36.0 - 46.0 %   MCV 94.1  78.0 - 100.0 fL   MCH 31.4  26.0 - 34.0 pg   MCHC 33.3  30.0 - 36.0 g/dL   RDW 13.0  11.5 - 15.5 %   Platelets 423 (*) 150 - 400 K/uL   Neutrophils Relative % 68  43 - 77 %   Neutro Abs 6.0  1.7 - 7.7 K/uL   Lymphocytes Relative 18  12 - 46 %   Lymphs Abs 1.6  0.7 - 4.0 K/uL   Monocytes Relative 11  3 - 12 %   Monocytes Absolute 1.0  0.1 - 1.0 K/uL   Eosinophils Relative 3  0 - 5 %   Eosinophils Absolute 0.3  0.0 - 0.7 K/uL   Basophils Relative 0  0 - 1 %   Basophils Absolute 0.0  0.0 - 0.1 K/uL  COMPREHENSIVE METABOLIC PANEL     Status: Abnormal   Collection Time    05/09/14  6:50 AM      Result Value Ref Range   Sodium 141  137 - 147 mEq/L   Potassium 3.5 (*) 3.7 - 5.3 mEq/L   Chloride 101  96 - 112 mEq/L   CO2 26  19 - 32 mEq/L   Glucose, Bld 102 (*) 70 - 99 mg/dL   BUN 10  6 - 23 mg/dL   Creatinine, Ser 0.95  0.50 - 1.10 mg/dL   Calcium 9.4  8.4 - 10.5 mg/dL   Total Protein 6.8  6.0 - 8.3 g/dL   Albumin 2.3 (*) 3.5 - 5.2 g/dL   AST 39 (*) 0 - 37 U/L   ALT 40 (*) 0 - 35 U/L   Alkaline Phosphatase 55  39 - 117 U/L   Total Bilirubin 0.3  0.3 - 1.2 mg/dL   GFR calc non Af Amer 62 (*) >90 mL/min   GFR calc Af Amer 72 (*) >90 mL/min   Comment: (NOTE)     The eGFR has been calculated using the CKD EPI equation.     This calculation has not been validated in all clinical situations.     eGFR's persistently <90 mL/min signify possible Chronic  Kidney     Disease.   Anion gap 14  5 - 15     HEENT: poor dentition Cardio: RRR and no murmur Resp: CTA B/L and unlabored GI: BS positive and NT, ND Extremity:  Pulses positive and No Edema. Right foot Skin:   Intact and Wound C/D/I and Left AKA incision without tenderness, Right foot without lesion Neuro: Flat, Cranial Nerve II-XII normal, Abnormal Motor 4/5 in RUE, 3- R HF, 4/5 R KE, ADF, 5/5 LUE, 3/5 L HF, Abnormal FMC Ataxic/ dec FMC and Dysarthric Musc/Skel:  Swelling Left thigh no erythema Gen NAD   Assessment/Plan: 1. 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 Physiatrist is providing close team supervision and 24 hour management of active medical problems listed below. Physiatrist and rehab team continue to assess barriers to discharge/monitor patient progress toward functional and medical goals. FIM: FIM - Bathing Bathing Steps Patient Completed: Chest;Right Arm;Left Arm;Abdomen;Front perineal area;Buttocks;Right upper leg;Left upper leg Bathing: 4: Min-Patient completes 8-9 11f10 parts or 75+ percent  FIM - Upper Body Dressing/Undressing Upper body dressing/undressing: 0: Wears gown/pajamas-no public clothing FIM - Lower Body Dressing/Undressing Lower body dressing/undressing: 0: Wears gInterior and spatial designer       FIM - BControl and instrumentation engineerDevices: Bed rails;Arm rests Bed/Chair Transfer: 2: Chair or W/C > Bed: Max A (lift and lower assist)  FIM - Locomotion: Wheelchair Distance: 20 Locomotion: Wheelchair: 1: Travels less than 50 ft with supervision, cueing or coaxing FIM - Locomotion: Ambulation Locomotion: Ambulation Assistive Devices: WAdministratorAmbulation/Gait Assistance: 4: Min guard Locomotion: Ambulation: 1: Travels less than 50 ft with minimal assistance (Pt.>75%)  Comprehension Comprehension Mode:  Auditory Comprehension: 4-Understands basic 75 - 89% of the time/requires cueing 10 - 24% of the  time  Expression Expression Mode: Verbal (very HOH.almost deaf reads lips,points) Expression Assistive Devices: 6-Communication board Expression: 3-Expresses basic 50 - 74% of the time/requires cueing 25 - 50% of the time. Needs to repeat parts of sentences.  Social Interaction Social Interaction Mode: Asleep Social Interaction: 4-Interacts appropriately 75 - 89% of the time - Needs redirection for appropriate language or to initiate interaction.  Problem Solving Problem Solving: 4-Solves basic 75 - 89% of the time/requires cueing 10 - 24% of the time  Memory Memory: 4-Recognizes or recalls 75 - 89% of the time/requires cueing 10 - 24% of the time  Medical Problem List and Plan:  1. 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  2. LLE DVT/Anticoagulation: Pharmaceutical: Xarelto  3. Pain Management: Will increase OxyContin 15 mg bid and continue oxycodone prn. Due to hearing impairment adn aphasia , has difficulty communicating need for prn meds -observe mental status closely  4. Mood: Continue cymbalta for pain as well as mood stabilization. Team to provide ego support and encouragement to help with participation. LCSW to follow for evaluation and support.  5. Neuropsych: This patient is not capable of making decisions on her own behalf.  6. Skin/Wound Care: Monitor wound daily for healing.  7. Fluids/Electrolytes/Nutrition: Monitor intake for adequate hydration and nutritional status.  8. HTN: Will monitor every 8 hours. Continue Coreg and Altace.  9. CAD: Monitor for symptoms with recommendations to add nitrate if needed. Continue coreg bid, Altace and Lipitor.  10. Recent L-MCA infarct residual mild R HP: on Xarelto.  11. Hypokalemia: resolved past supplementation. Will recheck in am.  12. Leucocytosis: Resolving. Monitor for signs of infection. Monitor temperature curve. Off  antibiotics. Recheck in am.  13. Acute blood loss anemia: Will check CBC for follow up in am.    LOS (Days) 3 A FACE TO FACE EVALUATION WAS PERFORMED  Traci Mclaughlin 05/11/2014, 6:32 AM

## 2014-05-11 NOTE — Progress Notes (Signed)
Physical Therapy Session Note  Patient Details  Name: Traci Mclaughlin MRN: [id number] Date of Birth: 09/27/1950  Today's Date: 05/11/2014 PT Individual Time: 1430-1530 PT Individual Time Calculation (min): 60 min   Short Term Goals: Week 1:  PT Short Term Goal 1 (Week 1): =LTGs due to estimated LOS  Skilled Therapeutic Interventions/Progress Updates:    Therapeutic Activity: Pt demonstrates supine to sit transfer mod I from flat bed without rails. PT obtains R sock and shoe and pt dons it with increased time and supervision, initially reporting she was unable to don the shoe edge of bed, but then able to don it in long sit. Pt req min A for sit to stand, then CGA to hop to sink, where she stood brushing her teeth and rinsing with mouthwash with CGA and B arms on sink for support.   Gait Training: PT instructs pt in ambulation with bari RW req max encouragement x 20'. Pt reports she is tired, becomes tearful, and stops walking.   W/C Management: PT instructs pt in self propelling manual w/c req SBA x 150' in straight line, but req tactile and visual cues min A to turn w/c a sharp 90 degree turn. Pt req occasional visual cues for brakes management.   Neuromuscular Reeducation: PT introduces mirror therapy to pt. PT explained the premise of why phantom limb pain occurs, and how to do mirror therapy, and session proved to appear to be beneficial, until pt decided to look behind the mirror at her amputated hand, and then burst into tears and c/o high pain levels. PT explained to pt prior to this that she is supposed to keep her focus on the mirror reflection and try to tell her brain it is her actual left leg, but PT did not have a chance to stop pt from looking behind the mirror at her amputated left leg when it happened in the moment.  Leg exercises include: seated march, LAQ, ankle pumps, hip abduction/adduction x 15 reps each in sit, and rolling a red ball under foot and rubbing a washcloth  on the ground with a foot.   Pt continues to need encouragement to participate in PT, but is showing fewer episodes of tearfulness during PT and is slowly progressing functional mobility. Continue per PT POC.   Therapy Documentation Precautions:  Precautions Precautions: Fall Precaution Comments: LLE AKA-HOH- needs written communication Restrictions Weight Bearing Restrictions: Yes LLE Weight Bearing: Non weight bearing (to stump) Other Position/Activity Restrictions: wears Darco sandal to unweight L toes Pain: Pain Assessment Pain Assessment: 0-10 Pain Score: 8  Pain Type: Phantom pain Pain Location: Leg Pain Orientation: Left Pain Descriptors / Indicators: Aching;Constant;Nagging Pain Frequency: Occasional Pain Onset: On-going Patients Stated Pain Goal: 3 Pain Intervention(s): Other (Comment) (mirror therapy) Multiple Pain Sites: No Locomotion : Ambulation Ambulation/Gait Assistance: 4: Min guard    See FIM for current functional status  Therapy/Group: Individual Therapy  Jazzmon Prindle M 05/11/2014, 2:48 PM

## 2014-05-11 NOTE — Progress Notes (Signed)
Physical Therapy Session Note  Patient Details  Name: Traci Mclaughlin MRN: [id number] Date of Birth: December 29, 1950  Today's Date: 05/11/2014 PT Individual Time: 0930-1030 PT Individual Time Calculation (min): 60 min   Short Term Goals: Week 1:  PT Short Term Goal 1 (Week 1): =LTGs due to estimated LOS  Skilled Therapeutic Interventions/Progress Updates:  1:1. Pt received supine in bed, ready for therapy. RN present at start of session to provide pain medicine. Focus this session on activity tolerance, functional endurance during w/c propulsion and ambulation, standing tolerance and balance. Pt req supervision for t/f sup>sit EOB w/ use of arm rests. PT assisting with donning clothes for management of time. Pt req min A for all t/f sit<>stand w/ RW as well as squat pivot t/f to R.   Pt with fair functional endurance to mobility. Pt able to propel w/c 100'x2 and 75'x1 with min A and cues for propulsion technique. Pt req min guard A for ambulation x25' w/ RW, demonstrating shuffle vs. True hop on R LE. Pt req cues for posture and increased WB through B UE for improved R foot clearance. Ambulatory endurance limited primarily by pain in R LE that is exacerbated in when in gravity dependent position.   Pt engaged in 2 rounds of horseshoes, reaching to both L and R sides at edge of BOS to target, balance, posture and standing tolerance. Pt req min A overall with tactile cues for posture.   Pt left sitting in w/c at end of session w/ all needs in reach.  Therapy Documentation Precautions:  Precautions Precautions: Fall Precaution Comments: LLE AKA-HOH- needs written communication Restrictions Weight Bearing Restrictions: Yes LLE Weight Bearing: Non weight bearing (to stump) Other Position/Activity Restrictions: wears Darco sandal to unweight L toes   Pain: Pain Assessment Pain Assessment: 0-10 Pain Score: 10-Worst pain ever Faces Pain Scale: Hurts a little bit Pain Type: Acute pain Pain  Location: Leg Pain Orientation: Left Pain Descriptors / Indicators: Aching;Constant;Nagging Pain Frequency: Occasional Pain Onset: Gradual Patients Stated Pain Goal: 3 Pain Intervention(s): Medication (See eMAR) (robaxin 500 mgpo)  See FIM for current functional status  Therapy/Group: Individual Therapy  [name] 05/11/2014, 12:31 PM

## 2014-05-11 NOTE — Plan of Care (Signed)
Problem: RH SKIN INTEGRITY Goal: RH STG ABLE TO PERFORM INCISION/WOUND CARE W/ASSISTANCE STG Able To Perform Incision/Wound Care With mod. Assistance.  Outcome: Not Applicable Date Met:  15/86/82 Staff perform dressing changes to L aka,pt is now wearing stump shrinker. Pt educated on purpose and benefits.wbb

## 2014-05-11 NOTE — Progress Notes (Signed)
Occupational Therapy Session Note  Patient Details  Name: Traci Mclaughlin MRN: [id number] Date of Birth: [birth year]  Today's Date: 05/11/2014 OT Individual Time: 1100-1200 OT Individual Time Calculation (min): 60 min    Short Term Goals: Week 1:  OT Short Term Goal 1 (Week 1): LTG=STG due to short LOS  Skilled Therapeutic Interventions/Progress Updates:  Upon entering room, pt seated in WC with c/o 10/10 L LE pain. OT notified RN. OT educated pt on strategies to decreased pain such as rubbing or tapping gently for sensation. Pt verbalized understanding. All education and instructions given with use of dry erase board unless directions could be gestured or simple enough for lip reading. Pt refused A & D session. OT suggested leaving room and working on community re-entry activities. Pt began crying and stating, "I don't want anyone to see me like this. They will all be looking at me." Therapist utilizing therapeutic use of self to calm pt and she then agreed to task. Pt propelled wheelchair 300+ feet with B UEs and with multiple rest breaks secondary to fatigue. Pt navigated self through aisles of gift shop with min cues for reassurance. Pt reporting once back in room, "I had fun. I am glad we did that even though I didn't want to at first." Pt seeming much calmer and smiling at end of session. Therapist assisted pt with setting up food tray while she is seated in wheelchair. Call bell within reach and all needed items available.  Therapy Documentation Precautions:  Precautions Precautions: Fall Precaution Comments: LLE AKA-HOH- needs written communication Restrictions Weight Bearing Restrictions: Yes LLE Weight Bearing: Non weight bearing (to stump) Other Position/Activity Restrictions: wears Darco sandal to unweight L toes Pain: Pain Assessment Pain Assessment: 0-10 Pain Score: 10-Worst pain ever Faces Pain Scale: Hurts a little bit Pain Type: Acute pain Pain Location: Leg Pain  Orientation: Left Pain Descriptors / Indicators: Aching;Constant;Nagging Pain Frequency: Occasional Pain Onset: Gradual Patients Stated Pain Goal: 3 Pain Intervention(s): Medication (See eMAR) (robaxin 500 mgpo)  See FIM for current functional status  Therapy/Group: Individual Therapy  Phineas Semen 05/11/2014, 12:36 PM

## 2014-05-12 ENCOUNTER — Inpatient Hospital Stay (HOSPITAL_COMMUNITY): Payer: Medicare Other

## 2014-05-12 DIAGNOSIS — I639 Cerebral infarction, unspecified: Secondary | ICD-10-CM

## 2014-05-12 DIAGNOSIS — F329 Major depressive disorder, single episode, unspecified: Secondary | ICD-10-CM

## 2014-05-12 NOTE — Plan of Care (Signed)
Problem: RH SKIN INTEGRITY Goal: RH STG ABLE TO PERFORM INCISION/WOUND CARE W/ASSISTANCE STG Able To Perform Incision/Wound Care With mod. Assistance.  Outcome: Not Applicable Date Met:  05/12/14 Pt is not yet changing dressing.Staff provide care for L stump.     

## 2014-05-12 NOTE — Progress Notes (Addendum)
Subjective/Complaints:   Pain under better control, pt smiling  Review of Systems - limited by hearing loss and aphasia   Objective: Vital Signs: Blood pressure 119/55, pulse 85, temperature 98.5 F (36.9 C), temperature source Oral, resp. rate 18, weight 73.5 kg (162 lb 0.6 oz), SpO2 94.00%. No results found. No results found for this or any previous visit (from the past 72 hour(s)).   HEENT: poor dentition Cardio: RRR and no murmur Resp: CTA B/L and unlabored GI: BS positive and NT, ND Extremity:  Pulses positive and No Edema. Right foot Skin:   Intact and Wound C/D/I and Left AKA incision without tenderness, Right foot without lesion Neuro: Flat, Cranial Nerve II-XII normal, Abnormal Motor 4/5 in RUE, 3- R HF, 4/5 R KE, ADF, 5/5 LUE, 3/5 L HF, Abnormal FMC Ataxic/ dec FMC and Dysarthric Musc/Skel:  Swelling Left thigh no erythema Gen NAD   Assessment/Plan: 1. 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 Physiatrist is providing close team supervision and 24 hour management of active medical problems listed below. Physiatrist and rehab team continue to assess barriers to discharge/monitor patient progress toward functional and medical goals. FIM: FIM - Bathing Bathing Steps Patient Completed: Chest;Right Arm;Left Arm;Abdomen;Front perineal area;Buttocks;Right upper leg;Left upper leg Bathing: 4: Min-Patient completes 8-9 70f 10 parts or 75+ percent  FIM - Upper Body Dressing/Undressing Upper body dressing/undressing: 0: Wears gown/pajamas-no public clothing FIM - Lower Body Dressing/Undressing Lower body dressing/undressing: 0: Wears Interior and spatial designer        FIM - Control and instrumentation engineer Devices: Environmental consultant;Arm rests Bed/Chair Transfer: 6: Supine > Sit: No assist;4: Bed > Chair or W/C: Min A (steadying Pt. > 75%);4: Chair or  W/C > Bed: Min A (steadying Pt. > 75%)  FIM - Locomotion: Wheelchair Distance: 150 Locomotion: Wheelchair: 4: Travels 150 ft or more: maneuvers on rugs and over door sillls with minimal assistance (Pt.>75%) FIM - Locomotion: Ambulation Locomotion: Ambulation Assistive Devices: Administrator Ambulation/Gait Assistance: 4: Min guard Locomotion: Ambulation: 1: Travels less than 50 ft with minimal assistance (Pt.>75%)  Comprehension Comprehension Mode: Auditory Comprehension: 5-Understands basic 90% of the time/requires cueing < 10% of the time  Expression Expression Mode: Verbal Expression Assistive Devices: 6-Communication board Expression: 5-Expresses basic 90% of the time/requires cueing < 10% of the time.  Social Interaction Social Interaction Mode: Asleep Social Interaction: 5-Interacts appropriately 90% of the time - Needs monitoring or encouragement for participation or interaction.  Problem Solving Problem Solving: 4-Solves basic 75 - 89% of the time/requires cueing 10 - 24% of the time  Memory Memory: 5-Recognizes or recalls 90% of the time/requires cueing < 10% of the time  Medical Problem List and Plan:  1. 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  2. LLE DVT/Anticoagulation: Pharmaceutical: Xarelto  3. Pain Management: Will increase OxyContin 15 mg bid and continue oxycodone prn. Due to hearing impairment adn aphasia , has difficulty communicating need for prn meds -observe mental status closely  4. Mood: Continue cymbalta for pain as well as mood stabilization. Team to provide ego support and encouragement to help with participation. LCSW to follow for evaluation and support.  5. Neuropsych: This patient is not capable of making decisions on her own behalf.  6. Skin/Wound Care: Monitor wound daily for healing.  7. Fluids/Electrolytes/Nutrition: Monitor intake for adequate hydration and nutritional status.  8. HTN: Will monitor every 8 hours. Continue  Coreg  and Altace.  9. CAD: Monitor for symptoms with recommendations to add nitrate if needed. Continue coreg bid, Altace and Lipitor.  10. Recent L-MCA infarct residual mild R HP: on Xarelto.  11. Hypokalemia: resolved past supplementation. Will recheck in am.  12. Leucocytosis: Resolved afeb 13. Acute blood loss anemia: Will check CBC for follow up in am.    LOS (Days) 4 A FACE TO FACE EVALUATION WAS PERFORMED  Traci Mclaughlin E 05/12/2014, 7:39 AM

## 2014-05-12 NOTE — Progress Notes (Signed)
Nursing Note: Pt has been asleep since shift change at 1900 till 2300 when she called for bathroom assistance.Pt medicated w/OxyCr 15 mg .Will monitor for sedation.wbb

## 2014-05-12 NOTE — Progress Notes (Signed)
Physical Therapy Session Note  Patient Details  Name: Traci Mclaughlin MRN: [id number] Date of Birth: Nov 16, 1950  Today's Date: 05/12/2014 PT Individual Time: 0805-0905 PT Individual Time Calculation (min): 60 min    Skilled Therapeutic Interventions/Progress Updates:    Pt received supine in bed and agreeable to therapy, but states "I didn't know you were coming." PT reviewed schedule with pt and discussed via communication board. Pt request to wash up at the sink and don clothing before leaving the room. Pt transferred to wheel chair, squat pivot tsf with min A and tactile cues to scoot forward and weight shift to the right to complete transfer. Pt able to complete bathing and grooming tasks at the sink, completing multiple sit to stand/stand to sit transfers at the sink to assist with bathing. Pt only requires set up for bathing and grooming and request PT assist with bathing her back. Pt tearful throughout tasks, reporting pain and discussing her progression. Pt propelled wheel chair in hallways, 150 feet at supervision level with cues for technique initially and cues to avoid obstacles. Pt agreeable to ambulation, tolerating only x15 feet with bari- RW and CGA. Pt received pain medication upon return to room. Pt agreeable to remaining in wheel chair and request PT to open the blinds. All needs met and within reach.   Therapy Documentation Precautions:  Precautions Precautions: Fall Precaution Comments: LLE AKA-HOH- needs written communication Restrictions Weight Bearing Restrictions: Yes LLE Weight Bearing: Non weight bearing Other Position/Activity Restrictions: wears Darco sandal to unweight L toes    Pain: Pain Assessment Pain Assessment: Faces Faces Pain Scale: Hurts even more Pain Type: Acute pain;Phantom pain Pain Location: Leg Pain Orientation: Left Pain Descriptors / Indicators: Aching;Constant Pain Onset: On-going Patients Stated Pain Goal: 1 Pain Intervention(s): RN  made aware;Repositioned;Emotional support    Locomotion : Ambulation Ambulation/Gait Assistance: 4: Min guard Wheelchair Mobility Distance: 150   See FIM for current functional status  Therapy/Group: Individual Therapy  Shari Natt R 05/12/2014, 9:51 AM

## 2014-05-13 ENCOUNTER — Inpatient Hospital Stay (HOSPITAL_COMMUNITY): Payer: Medicare Other | Admitting: Physical Therapy

## 2014-05-13 ENCOUNTER — Inpatient Hospital Stay (HOSPITAL_COMMUNITY): Payer: Medicare Other | Admitting: *Deleted

## 2014-05-13 NOTE — Progress Notes (Signed)
Tearful at times. Communicating with dry erase board. Patrici Ranks A

## 2014-05-13 NOTE — Progress Notes (Signed)
Subjective/Complaints:   Pain under better control, pt smiling Asking if dressing will be changed today  Review of Systems - limited by hearing loss and aphasia   Objective: Vital Signs: Blood pressure 129/60, pulse 78, temperature 98.3 F (36.8 C), temperature source Oral, resp. rate 18, weight 73.5 kg (162 lb 0.6 oz), SpO2 98.00%. No results found. No results found for this or any previous visit (from the past 72 hour(s)).   HEENT: poor dentition Cardio: RRR and no murmur Resp: CTA B/L and unlabored GI: BS positive and NT, ND Extremity:  Pulses positive and No Edema. Right foot Skin:   Intact and Wound C/D/I and Left AKA incision without tenderness, Neuro: Flat, Cranial Nerve II-XII normal, Abnormal Motor 4/5 in RUE, 3- R HF, 4/5 R KE, ADF, 5/5 LUE, 3/5 L HF, Abnormal FMC Ataxic/ dec FMC and Dysarthric Musc/Skel:  Swelling Left thigh no erythema Gen NAD   Assessment/Plan: 1. 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 Physiatrist is providing close team supervision and 24 hour management of active medical problems listed below. Physiatrist and rehab team continue to assess barriers to discharge/monitor patient progress toward functional and medical goals. FIM: FIM - Bathing Bathing Steps Patient Completed: Chest;Right Arm;Left Arm;Front perineal area;Right upper leg;Left upper leg Bathing: 4: Min-Patient completes 8-9 18f 10 parts or 75+ percent  FIM - Upper Body Dressing/Undressing Upper body dressing/undressing steps patient completed: Thread/unthread right bra strap;Thread/unthread left bra strap;Hook/unhook bra;Thread/unthread right sleeve of pullover shirt/dresss;Thread/unthread left sleeve of pullover shirt/dress;Put head through opening of pull over shirt/dress;Pull shirt over trunk;Thread/unthread right sleeve of front closure shirt/dress;Thread/unthread left  sleeve of front closure shirt/dress;Pull shirt around back of front closure shirt/dress Upper body dressing/undressing: 4: Min-Patient completed 75 plus % of tasks FIM - Lower Body Dressing/Undressing Lower body dressing/undressing steps patient completed: Thread/unthread right pants leg;Thread/unthread left pants leg;Pull pants up/down;Fasten/unfasten right shoe;Don/Doff right shoe Lower body dressing/undressing: 3: Mod-Patient completed 50-74% of tasks        FIM - Control and instrumentation engineer Devices: Walker;Arm rests Bed/Chair Transfer: 6: Supine > Sit: No assist;4: Bed > Chair or W/C: Min A (steadying Pt. > 75%);4: Chair or W/C > Bed: Min A (steadying Pt. > 75%)  FIM - Locomotion: Wheelchair Distance: 150 Locomotion: Wheelchair: 5: Travels 150 ft or more: maneuvers on rugs and over door sills with supervision, cueing or coaxing FIM - Locomotion: Ambulation Locomotion: Ambulation Assistive Devices: Administrator Ambulation/Gait Assistance: 4: Min guard Locomotion: Ambulation: 1: Travels less than 50 ft with minimal assistance (Pt.>75%) (15 feet x2)  Comprehension Comprehension Mode: Auditory Comprehension: 5-Understands complex 90% of the time/Cues < 10% of the time  Expression Expression Mode: Verbal Expression Assistive Devices: 6-Communication board Expression: 5-Expresses basic 90% of the time/requires cueing < 10% of the time.  Social Interaction Social Interaction Mode: Asleep Social Interaction: 5-Interacts appropriately 90% of the time - Needs monitoring or encouragement for participation or interaction.  Problem Solving Problem Solving: 4-Solves basic 75 - 89% of the time/requires cueing 10 - 24% of the time  Memory Memory: 5-Recognizes or recalls 90% of the time/requires cueing < 10% of the time  Medical Problem List and Plan:  1. 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  2. LLE DVT/Anticoagulation: Pharmaceutical: Xarelto  3.  Pain Management: Will increase OxyContin 15 mg bid and continue oxycodone prn. Due to hearing impairment adn aphasia , has difficulty  communicating need for prn meds -observe mental status closely  4. Mood: Continue cymbalta for pain as well as mood stabilization. Team to provide ego support and encouragement to help with participation. LCSW to follow for evaluation and support.  5. Neuropsych: This patient is not capable of making decisions on her own behalf.  6. Skin/Wound Care: Monitor wound daily for healing.  7. Fluids/Electrolytes/Nutrition: Monitor intake for adequate hydration and nutritional status.  8. HTN: Will monitor every 8 hours. Continue Coreg and Altace.  9. CAD: Monitor for symptoms with recommendations to add nitrate if needed. Continue coreg bid, Altace and Lipitor.  10. Recent L-MCA infarct residual mild R HP: on Xarelto.  11. Hypokalemia: resolved past supplementation. Will recheck in am.  12. Leucocytosis: Resolved afeb 13. Acute blood loss anemia: Hgb stable   LOS (Days) 5 A FACE TO FACE EVALUATION WAS PERFORMED  Traci Mclaughlin E 05/13/2014, 7:14 AM

## 2014-05-13 NOTE — Progress Notes (Signed)
Occupational Therapy Session Note  Patient Details  Name: Traci Mclaughlin MRN: [id number] Date of Birth: 05-04-1951  Today's Date: 05/13/2014 OT Individual Time: 0900-1000 OT Individual Time Calculation (min): 60 min    Short Term Goals: Week 1:  OT Short Term Goal 1 (Week 1): LTG=STG due to short LOS      Skilled Therapeutic Interventions/Progress Updates:    Engaged in functional wc mobility, sit to stand, standing balance, transferrs.  Pt. Propelled wc to shower area with minimal assist getting over threshold.  Transferred to shower bench with mod assist for pivoting and transverse movement.  Pt. Washed periarea by standing with minimal assist and maintained standing balance for 30 seconds while OT assisted with cleaning posterior peri area.  Pt transferred back to wc and finished dressing and grooming at sink.    Pt very tearful at  Beginning of session but much less after engaging in activity and getting shower.  Pt stated she felt a lot better.  Left pt in wc with all needs in reach.    Therapy Documentation Precautions:  Precautions Precautions: Fall Precaution Comments: LLE AKA-HOH- needs written communication Restrictions Weight Bearing Restrictions: Yes LLE Weight Bearing: Non weight bearing Other Position/Activity Restrictions: wears Darco sandal to unweight L toes    Pain: Pain Assessment Pain Assessment: 0-10 Pain Score: 10-Worst pain ever Pain Type: Surgical pain Pain Location: Leg Pain Orientation: Left;Proximal Pain Onset: On-going Pain Intervention(s): Rest Multiple Pain Sites: Yes 2nd Pain Site Pain Score: 310 Pain Type: Phantom pain Pain Location: Leg Pain Orientation: Left Pain Onset: On-going Pain Intervention(s): Rest      See FIM for current functional status  Therapy/Group: Individual Therapy  Lisa Roca 05/13/2014, 9:59 AM

## 2014-05-13 NOTE — Progress Notes (Signed)
Physical Therapy Session Note  Patient Details  Name: Traci Mclaughlin MRN: [id number] Date of Birth: 09-Sep-1950  Today's Date: 05/13/2014 PT Individual Time: 0800-0900 Treatment Session 2: 1100-1200 PT Individual Time Calculation (min): 60 min Treatment Session 2: 60 min  Short Term Goals: Week 1:  PT Short Term Goal 1 (Week 1): =LTGs due to estimated LOS  Skilled Therapeutic Interventions/Progress Updates:  Treatment Session 1: White board and pantomiming used for communication throughout session.   Therapeutic activity: PT brings pt clothing, per her request to dress prior to leaving the room. Pt performs bed mobility mod I and dresses upper body with set up assist and lower body with min assist to finish pulling pants and underwear over bottom.  PT instructs pt in sit to stand technique with visual cues on white board for sequencing and technique and req SBA for sit to stand on first attempt and CGA on second attempt due to unstable walker.  Pt requests to brush her teeth, so she hops over to the sink and completes standing dynamic balance with B arms on sink req SBA while brushing her teeth and rinsing her mouth with mouthwash.   Orthotic Management: Pt adamantly refuses to wear shrinker. PT explains that she must re-ace wrap pt's residual liimb in order to provide desensitization and in order to promote shaping of the limb so that pt will be able to wear a prosthesis. Pt nods in understanding. PT completes ace-wrap in figure 8 pattern to pt's L residual limb.   Neuromuscular Reeducation: PT instructs pt in TUG for 3 attempts and pt scores: 65 seconds, 54 seconds, and 58 seconds, respectively.   Therapeutic Exercise: PT instructs pt in standing ROM exercises to L residual limb: hip extension, hip abduction, hip flexion x 10 reps each - poor hip extension ROM noted and pt compensates with trunk movement significantly, despite PT's tactile cues to hold it still.   Treatment Session  2: W/'C Management: PT instructs pt in w/c propulsion with B UEs x 200' x 2 reps req SBA and visual cues to participate.   Neuromuscular Reeducation: PT instructs pt in mirror visual feedback therapy with mirror box in long sit position to reduce pain. Exercises include: ankle pumps, hip IR/ER with knee straight, heel slides, supine hip abduction/adduction, ankle circles each direction: 2 x 20 reps each (20 minutes total). Pt req repeated verbal cues to focus on mirror illusion.   Therapeutic Activity: PT instructs pt in w/c to/from mat transfer with RW req SBA and visual cues for hand placement. PT instructs pt in short sit to/from long sit positioning req SBA and increased time.   Pt agrees to physical therapy intervention with moderate encouragement. PT tries to make treatment sessions meaningful for pt, so when she requests to dress and brush her teeth, PT incorporates this into bed mobility and standing balance training. Pt's L hip flexor is tight and she is apprehensive and avoidant about extending her L HIP; she needs more work on this. Pt reports mirror therapy completely abolished her phantom limb pain. PT explains to pt that she can continue with mirror therapy at home by simply putting a mirror between her legs, but she must do it daily to keep the pain away. Pt nods understanding. Continue per PT POC.   Therapy Documentation Precautions:  Precautions Precautions: Fall Precaution Comments: LLE AKA-HOH- needs written communication Restrictions Weight Bearing Restrictions: Yes LLE Weight Bearing: Non weight bearing Other Position/Activity Restrictions: wears Darco sandal to unweight L toes  Pain: Pain Assessment Pain Assessment: 0-10 Pain Score: 10-Worst pain ever Pain Type: Surgical pain Pain Location: Leg Pain Orientation: Left;Proximal Pain Onset: On-going Pain Intervention(s): Rest Multiple Pain Sites: Yes 2nd Pain Site Pain Score: 10 Pain Type: Phantom pain Pain  Location: Leg Pain Orientation: Left Pain Onset: On-going Pain Intervention(s): Rest Treatment Session 2: Pt c/o 10/10 pain in L leg - PT uses rest and mirror therapy to reduce pt's pain.  Balance: Balance Balance Assessed: Yes Standardized Balance Assessment Standardized Balance Assessment: Timed Up and Go Test Timed Up and Go Test TUG: Normal TUG Normal TUG (seconds): 54  See FIM for current functional status  Therapy/Group: Individual Therapy  Kristian Mogg M 05/13/2014, 8:20 AM

## 2014-05-14 ENCOUNTER — Encounter (HOSPITAL_COMMUNITY): Payer: Medicare Other

## 2014-05-14 ENCOUNTER — Inpatient Hospital Stay (HOSPITAL_COMMUNITY): Payer: Medicare Other

## 2014-05-14 MED ORDER — OXYCODONE HCL ER 20 MG PO T12A
20.0000 mg | EXTENDED_RELEASE_TABLET | Freq: Two times a day (BID) | ORAL | Status: DC
  Administered 2014-05-14 – 2014-05-16 (×4): 20 mg via ORAL
  Filled 2014-05-14 (×4): qty 1

## 2014-05-14 MED ORDER — POLYETHYLENE GLYCOL 3350 17 G PO PACK
17.0000 g | PACK | Freq: Two times a day (BID) | ORAL | Status: DC
  Administered 2014-05-14 – 2014-05-25 (×16): 17 g via ORAL
  Filled 2014-05-14 (×25): qty 1

## 2014-05-14 NOTE — Progress Notes (Signed)
Patient has previously been refusing to have the stump shrinker applied, or the ace wrap.  Patient was tearful saying "its gonna bust open" talking about her stump incision.  I educated patient on the purpose of the shrinker and ace wrap to help decrease swelling, which will help with pain also, and patient allowed me to apply the ace wrap loosely to her stump.  She cried telling me "not too tight."  Patient now resting in bed.  Brita Romp, RN

## 2014-05-14 NOTE — Progress Notes (Addendum)
Occupational Therapy Note  Patient Details  Name: Traci Mclaughlin MRN: [id number] Date of Birth: Aug 06, 1950  Today's Date: 05/14/2014 OT Missed Time: 88 Minutes Missed Time Reason: Patient unwilling/refused to participate without medical reason;Patient fatigue  Patient missing 45 min skilled occupational therapy secondary to refusal and fatigue. Patient received supine in bed tearful. Therapist educated patient for 15 min about importance of participation in therapy to increase independence with self care and functional mobility however patient continued to decline. Notified RN. Will follow-up as able.    Attempted to makeup missed therapy in PM, however, pt continued to decline d/t pain and fatigue.   Seneca Gadbois N 05/14/2014, 10:12 AM

## 2014-05-14 NOTE — Progress Notes (Signed)
Subjective/Complaints:   Having a lot of stump pain. Denies phantom symptoms.  Review of Systems - limited by hearing loss and aphasia   Objective: Vital Signs: Blood pressure 123/65, pulse 90, temperature 98.4 F (36.9 C), temperature source Oral, resp. rate 19, weight 73.5 kg (162 lb 0.6 oz), SpO2 99.00%. No results found. No results found for this or any previous visit (from the past 72 hour(s)).    HEENT: poor dentition Cardio: RRR and no murmur Resp: CTA B/L and unlabored GI: BS positive and NT, ND Extremity:  Pulses positive and No Edema. Right foot Skin:   Intact and Wound C/D/I and Left AKA incision without tenderness, Neuro: Flat, Cranial Nerve II-XII normal, Abnormal Motor 4/5 in RUE, 3- R HF, 4/5 R KE, ADF, 5/5 LUE, 3/5 L HF, Abnormal FMC Ataxic/ dec FMC and Dysarthric Musc/Skel:  Swelling Left thigh no erythema. Gen NAD Psych: anxious, tearful at times   Assessment/Plan: 1. 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 Physiatrist is providing close team supervision and 24 hour management of active medical problems listed below. Physiatrist and rehab team continue to assess barriers to discharge/monitor patient progress toward functional and medical goals. FIM: FIM - Bathing Bathing Steps Patient Completed: Chest;Right Arm;Left Arm;Front perineal area;Right upper leg;Left upper leg;Abdomen;Right lower leg (including foot) Bathing: 4: Min-Patient completes 8-9 78f 10 parts or 75+ percent  FIM - Upper Body Dressing/Undressing Upper body dressing/undressing steps patient completed: Thread/unthread right bra strap;Thread/unthread left bra strap;Hook/unhook bra;Thread/unthread right sleeve of pullover shirt/dresss;Thread/unthread left sleeve of pullover shirt/dress;Put head through opening of pull over shirt/dress;Pull shirt over trunk Upper body  dressing/undressing: 5: Set-up assist to: Obtain clothing/put away FIM - Lower Body Dressing/Undressing Lower body dressing/undressing steps patient completed: Thread/unthread right underwear leg;Thread/unthread left underwear leg;Thread/unthread right pants leg;Thread/unthread left pants leg;Don/Doff right sock;Don/Doff right shoe Lower body dressing/undressing: 4: Min-Patient completed 75 plus % of tasks  FIM - Toileting Toileting steps completed by patient: Performs perineal hygiene Toileting Assistive Devices: Grab bar or rail for support Toileting: 2: Max-Patient completed 1 of 3 steps     FIM - Control and instrumentation engineer Devices: Environmental consultant;Arm rests Bed/Chair Transfer: 6: Supine > Sit: No assist;6: Sit > Supine: No assist;5: Bed > Chair or W/C: Supervision (verbal cues/safety issues);5: Chair or W/C > Bed: Supervision (verbal cues/safety issues)  FIM - Locomotion: Wheelchair Distance: 200 Locomotion: Wheelchair: 5: Travels 150 ft or more: maneuvers on rugs and over door sills with supervision, cueing or coaxing FIM - Locomotion: Ambulation Locomotion: Ambulation Assistive Devices: Administrator Ambulation/Gait Assistance: 5: Supervision Locomotion: Ambulation: 1: Travels less than 50 ft with supervision/safety issues  Comprehension Comprehension Mode: Auditory Comprehension: 5-Understands complex 90% of the time/Cues < 10% of the time  Expression Expression Mode: Verbal Expression Assistive Devices: 6-Communication board Expression: 5-Expresses basic 90% of the time/requires cueing < 10% of the time.  Social Interaction Social Interaction Mode: Asleep Social Interaction: 5-Interacts appropriately 90% of the time - Needs monitoring or encouragement for participation or interaction.  Problem Solving Problem Solving: 4-Solves basic 75 - 89% of the time/requires cueing 10 - 24% of the time  Memory Memory: 5-Recognizes or recalls 90% of the time/requires  cueing < 10% of the time  Medical Problem List and Plan:  1. 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  2. LLE DVT/Anticoagulation: Pharmaceutical: Xarelto  3. Pain Management: increase oxycontin to 20mg  q12  and continue oxycodone prn. Due to hearing impairment adn aphasia , has difficulty communicating need for prn meds -anxiety is a major issue 4. Mood: Continue cymbalta for pain as well as mood stabilization. Team to provide ego support and encouragement to help with participation. LCSW to follow for evaluation and support.  5. Neuropsych: This patient is not capable of making decisions on her own behalf.  6. Skin/Wound Care: wound clean 7. Fluids/Electrolytes/Nutrition: Monitor intake for adequate hydration and nutritional status.  8. HTN: Will monitor every 8 hours. Continue Coreg and Altace.  9. CAD: Monitor for symptoms with recommendations to add nitrate if needed. Continue coreg bid, Altace and Lipitor.  10. Recent L-MCA infarct residual mild R HP: on Xarelto.  11. Hypokalemia: resolved past supplementation  12. Leucocytosis: Resolved afeb 13. Acute blood loss anemia: Hgb stable   LOS (Days) 6 A FACE TO FACE EVALUATION WAS PERFORMED  SWARTZ,ZACHARY T 05/14/2014, 8:01 AM

## 2014-05-14 NOTE — Progress Notes (Signed)
Tearful this morning, refusing scheduled ultram. Patient complaining and anxious about left AKA having a "spreading" sensation. Ace wrap in place, refusing stump shrinker. LBM 05/10/14, last HS agreeable to take miralax this AM, now refusing because of pain/sensation to leg. Will continue to monitor. Traci Mclaughlin A

## 2014-05-14 NOTE — Progress Notes (Signed)
Physical Therapy Session Note  Patient Details  Name: Traci Mclaughlin MRN: [id number] Date of Birth: 04-May-1951  Today's Date: 05/14/2014 PT Individual Time: 0800-0815 PT Individual Time Calculation (min): 15 min  and Today's Date: 05/14/2014 PT Missed Time: 11 Minutes Missed Time Reason: Pain;Patient unwilling to participate Session 2 Time: 1030-1100 Time Calculation (min): 30 min Session 3 Time: 1500-1545 Time Calculation (min): 45 min   Short Term Goals: Week 1:  PT Short Term Goal 1 (Week 1): =LTGs due to estimated LOS  Skilled Therapeutic Interventions/Progress Updates:    Session 1: Pt received supine in bed, tearful and complaining of pain in LLE. Pt educated on importance of mobility and use of ace wrap/shrinker for pain and edema management. Pt continually declined participation w/ therapist despite max encouragement. Pt verbally to participate in 10:30 session. Pt left supine in bed w/ bed alarm on and all needs within reach. Pt missed 45 minutes of scheduled PT time.   Session 2: Pt received supine in bed, agreeable to participate in bed level therapy after mod encouragement. Pt continued to be extremely emotionally labile throughout session, continually telling therapist that she was hurting and did not want to leave the room. Pt agreed to perform mirror therapy at bedside for LLE pain management. Pt performed 2x10 LAQ, ankle DF/PF, ankle circles, hip flexion w/ mirror between legs while focusing on mirrored image. Pt did report that mirror therapy helped her pain. Pt transferred supine<>sit w/ mod (I). Pt continued to decline wrapping residual limb w/ ace wrap or shrinker, despite reminders of benefits for edema management and pain. Pt left supine in bed w/ bed alarm on and all needs within reach.   Session 3: Pt received supine in bed, agreeable to participate in therapy after max encouragement. Pt transferred supine>sit w/ mod (I), then ambulated around room 15' w/ RW and  supervision. Noted very little clearance w/ R foot, therapist attempted hands on assist however pt continually insisted that therapist not help her. Pt completed x5 sit<>stands w/ improved technique w/ R hand pushing up from bed and L hand on walker. Completed x15 hip flexion w/ BLE and x15 glute sets w/ 5 second hold. Attempted to engage pt in further therapeutic activity, but pt insisted on laying back down. Attempted to have pt use BUE to slide up in bed in Trendelenburg position, but pt began agitated when therapist put hands on chuck to help her slide up. Pt continually declined to slide up in bed despite poor positioning. Pt left supine in bed w/ bed alarm on and all needs within reach.   Therapy Documentation Precautions:  Precautions Precautions: Fall Precaution Comments: LLE AKA-HOH- needs written communication Restrictions Weight Bearing Restrictions: Yes LLE Weight Bearing: Non weight bearing Other Position/Activity Restrictions: wears Darco sandal to unweight L toes Vital Signs: Therapy Vitals Temp: 98.4 F (36.9 C) Temp Source: Oral Pulse Rate: 90 Resp: 19 BP: 123/65 mmHg Patient Position (if appropriate): Lying Oxygen Therapy SpO2: 99 % O2 Device: None (Room air) Pain: Pain Assessment Pain Assessment: 0-10 Pain Score: 10-Worst pain ever Pain Type: Surgical pain Pain Location: Leg Pain Orientation: Left Pain Descriptors / Indicators: Other (Comment);Spasm ("spreading") Pain Frequency: Intermittent Pain Onset: On-going Pain Intervention(s): Medication (See eMAR) Multiple Pain Sites: No  See FIM for current functional status  Therapy/Group: Individual Therapy  Rada Hay Rada Hay, PT, DPT 05/14/2014, 7:49 AM

## 2014-05-14 NOTE — Progress Notes (Signed)
   VASCULAR SURGERY ASSESSMENT & PLAN:  * POD 9 Left AKA  *  Steady progress in rehab  SUBJECTIVE: Anxious this AM.  PHYSICAL EXAM: Filed Vitals:   05/12/14 1430 05/13/14 0505 05/13/14 1500 05/14/14 0459  BP: 100/57 129/60 115/61 123/65  Pulse: 79 78 78 90  Temp: 98.4 F (36.9 C) 98.3 F (36.8 C) 98.2 F (36.8 C) 98.4 F (36.9 C)  TempSrc: Oral Oral Oral Oral  Resp: 18 18 16 19   Weight:      SpO2: 99% 98% 96% 99%   Left AKA inspected and looks fine.  No erythema or drainage.   Active Problems:   Unilateral complete AKA   Traci Mclaughlin Beeper: [phone] 05/14/2014

## 2014-05-15 ENCOUNTER — Inpatient Hospital Stay (HOSPITAL_COMMUNITY): Payer: Medicare Other

## 2014-05-15 MED ORDER — ALPRAZOLAM 0.25 MG PO TABS
0.2500 mg | ORAL_TABLET | Freq: Two times a day (BID) | ORAL | Status: DC
  Administered 2014-05-15 – 2014-05-25 (×20): 0.25 mg via ORAL
  Filled 2014-05-15 (×20): qty 1

## 2014-05-15 NOTE — Progress Notes (Signed)
Physical Therapy Session Note  Patient Details  Name: Traci Mclaughlin MRN: [id number] Date of Birth: 29-May-1951  Today's Date: 05/15/2014 PT Individual Time: 1000-1100 PT Individual Time Calculation (min): 60 min   Short Term Goals: Week 1:  PT Short Term Goal 1 (Week 1): =LTGs due to estimated LOS  Skilled Therapeutic Interventions/Progress Updates:    Pt received seated in w/c, agreeable to participate in therapy after mod encouragement. Pt continued to be emotionally labile today, though much more consolable than yesterday. Pt propelled w/c 100'x2 to/from ortho gym w/ supervision, mod VC's for concentrating on pushing w/ R hand (pt tends to veer R when propelling). Transferred w/ RW w/c<>mat table in ortho gym. Worked on RLE/LLE AROM in long sit. Pt w/ multiple sit<>stands w/ RW from mat table w/ supervision. In standing pt performed x10 hip extension on LLE, noted difficulty moving to neutral, pt compensated heavily w/ lumbar flexion despite cueing from PT, suspect pt developing hip flexion contracture. Pt educated on mobility and importance of ROM, but pt declines supine positioning for stretching. Completed x10 chair pushups for improving functional mobility. Pt left seated in w/c w/ all needs within reach.    Therapy Documentation Precautions:  Precautions Precautions: Fall Precaution Comments: LLE AKA-HOH- needs written communication Restrictions Weight Bearing Restrictions: Yes LLE Weight Bearing: Non weight bearing Other Position/Activity Restrictions: wears Darco sandal to unweight L toes Vital Signs: Therapy Vitals Temp: 97.8 F (36.6 C) Temp Source: Oral Pulse Rate: 82 Resp: 17 BP: 117/74 mmHg Patient Position (if appropriate): Lying Oxygen Therapy SpO2: 96 % O2 Device: None (Room air) Pain: Pain Assessment Pain Assessment: Faces Pain Score: Asleep Faces Pain Scale: Hurts little more Pain Type: Acute pain Pain Location: Leg Pain Orientation: Left Pain  Descriptors / Indicators: Aching Pain Frequency: Occasional Pain Onset: Gradual Pain Intervention(s): Medication (See eMAR) Multiple Pain Sites: No  See FIM for current functional status  Therapy/Group: Individual Therapy  Rada Hay Rada Hay, PT, DPT 05/15/2014, 7:38 AM

## 2014-05-15 NOTE — Consult Note (Signed)
INITIAL DIAGNOSTIC EVALUATION - [facility]   MEDICAL NECESSITY:  Reniyah Gootee was seen on the [facility] for an initial diagnostic evaluation owing to the patient's diagnosis of AKA. She has a history of CVA.    According to medical records, Ms. Dunnigan was admitted to the rehab unit owing to "Functional deficits secondary to L-AKA due to PAD." Records indicate that she is a "63 y.o. 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 She was admitted on 04/30/14 for pain control as well as IV antibiotics for treatment of infection. 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 She was cleared for surgery by Dr. Burt Knack and underwent L-AKA by Dr. Scot Dock on 05/04/14. PT/OT evaluations done and CIR recommended for follow up therapy. She is limited by anxiety, lability, communicative deficits (does not use sign language/ minimal ability to read lips) as well as pain."   During today's visit, Ms. Drollinger exhibited severe hearing loss and this examiner needed to essentially write everything down in order to communicate with her. In essence, the patient denied suffering from any major cognitive issues or mood disturbance. She seemed disinterested in this appointment. She denied any issues with therapy or the rehab staff. She feels that she is making some progress. There are some indications from staff that she has been somewhat less motivated and that she may be suffering from physiological signs of depression. She also reportedly has a history of stroke. Of note, Ms. Shinall's social worker reported that the patient is being treated for  depression via an antidepressant.   No adjustment issues endorsed. No major barriers to therapy identified except intermittent low motivation. Suicidal/homicidal ideation, plan or intent was denied. No manic or hypomanic episodes were reported. The patient denied ever experiencing any auditory/visual hallucinations. No major behavioral or personality changes were endorsed.   PROCEDURES ADMINISTERED: [1 unit [id number] on 05/14/14] Diagnostic clinical interview  Review of available records  Behavioral Evaluation: Ms. Riccobono was appropriately dressed for season and situation, and she appeared tidy and well-groomed. Normal posture was noted. She was quiet and generated almost no spontaneous conversation. Rapport was difficult to establish. She was extremely hard of hearing; bordering on deaf and I needed to write everything down in order to communicate with her. Her speech was hypophonic but she was generally able to express ideas effectively. Her affect was flat. Attention and motivation were variable.     SUMMARY & IMPRESSION: Overall, Ms. Rehfeldt was minimally engaged in this appointment but I was able to gather that she denies suffering any cognitive deficits. She also denies mood disturbance but appears to be suffering from moderate physiological signs of depression. I would recommend more comprehensive neuropsychological evaluation (especially given past history of stroke) but communication issues are a huge barrier as well as low motivation. I suggest more aggressive treatment for physiological signs of depression that could actually stem from past CVA. Testing could be a possibility in the future as an outpatient. She appears to have adequate social support via family. No formal follow-up with neuropsychology at this time.    RECOMMENDATIONS    Consider increasing antidepressant dose. Or consider switching to fluoxetine given its energizing properties. Possibly supplement with a  low dose  psychostimulant if not medically contraindicated. Use anxiolytic in moderation.    Follow-up with neuropsychology as needed.   DIAGNOSES:  Unilateral AKA Depressive disorder, NOS   Rutha Bouchard, Psy.D.  Clinical Neuropsychologist

## 2014-05-15 NOTE — Plan of Care (Signed)
Problem: RH PAIN MANAGEMENT Goal: RH STG PAIN MANAGED AT OR BELOW PT'S PAIN GOAL Pain goal less than 2  Outcome: Not Progressing Patient continues to report pain a 8-10 out of 10 with scheduled pain medication

## 2014-05-15 NOTE — Progress Notes (Signed)
Subjective/Complaints:   Resting quietly. Continues to have stump pain. Anxiety remains an issue Review of Systems - limited by hearing loss and aphasia   Objective: Vital Signs: Blood pressure 117/74, pulse 82, temperature 97.8 F (36.6 C), temperature source Oral, resp. rate 17, weight 73.5 kg (162 lb 0.6 oz), SpO2 96.00%. No results found. No results found for this or any previous visit (from the past 72 hour(s)).    HEENT: poor dentition Cardio: RRR and no murmur Resp: CTA B/L and unlabored GI: BS positive and NT, ND Extremity:  Pulses positive and No Edema. Right foot Skin:   Intact and Wound C/D/I and Left AKA tender Neuro: Flat, Cranial Nerve II-XII normal, Abnormal Motor 4/5 in RUE, 3- R HF, 4/5 R KE, ADF, 5/5 LUE, 3/5 L HF, Abnormal FMC Ataxic/ dec FMC and Dysarthric Musc/Skel:  Swelling Left thigh no erythema. Gen NAD Psych: resting this am   Assessment/Plan: 1. 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 Physiatrist is providing close team supervision and 24 hour management of active medical problems listed below. Physiatrist and rehab team continue to assess barriers to discharge/monitor patient progress toward functional and medical goals. FIM: FIM - Bathing Bathing Steps Patient Completed: Chest;Right Arm;Left Arm;Front perineal area;Right upper leg;Left upper leg;Abdomen;Right lower leg (including foot) Bathing: 4: Min-Patient completes 8-9 66f 10 parts or 75+ percent  FIM - Upper Body Dressing/Undressing Upper body dressing/undressing steps patient completed: Thread/unthread right bra strap;Thread/unthread left bra strap;Hook/unhook bra;Thread/unthread right sleeve of pullover shirt/dresss;Thread/unthread left sleeve of pullover shirt/dress;Put head through opening of pull over shirt/dress;Pull shirt over trunk Upper body dressing/undressing: 5: Set-up  assist to: Obtain clothing/put away FIM - Lower Body Dressing/Undressing Lower body dressing/undressing steps patient completed: Thread/unthread right underwear leg;Thread/unthread left underwear leg;Thread/unthread right pants leg;Thread/unthread left pants leg;Don/Doff right sock;Don/Doff right shoe Lower body dressing/undressing: 4: Min-Patient completed 75 plus % of tasks  FIM - Toileting Toileting steps completed by patient: Performs perineal hygiene Toileting Assistive Devices: Grab bar or rail for support Toileting: 2: Max-Patient completed 1 of 3 steps  FIM - Radio producer Devices: Elevated toilet seat;Grab bars;Walker Toilet Transfers: 4-To toilet/BSC: Min A (steadying Pt. > 75%)  FIM - Control and instrumentation engineer Devices: Walker;Arm rests Bed/Chair Transfer: 6: Supine > Sit: No assist;6: Sit > Supine: No assist  FIM - Locomotion: Wheelchair Distance: 200 Locomotion: Wheelchair: 0: Activity did not occur FIM - Locomotion: Ambulation Locomotion: Ambulation Assistive Devices: Administrator Ambulation/Gait Assistance: 5: Supervision Locomotion: Ambulation: 1: Travels less than 50 ft with supervision/safety issues  Comprehension Comprehension Mode: Auditory Comprehension: 5-Understands complex 90% of the time/Cues < 10% of the time  Expression Expression Mode: Verbal Expression Assistive Devices: 6-Communication board Expression: 5-Expresses basic 90% of the time/requires cueing < 10% of the time.  Social Interaction Social Interaction Mode: Asleep Social Interaction: 5-Interacts appropriately 90% of the time - Needs monitoring or encouragement for participation or interaction.  Problem Solving Problem Solving: 4-Solves basic 75 - 89% of the time/requires cueing 10 - 24% of the time  Memory Memory: 5-Recognizes or recalls 90% of the time/requires cueing < 10% of the time  Medical Problem List and Plan:  1.  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  2. LLE DVT/Anticoagulation: Pharmaceutical: Xarelto  3. Pain Management: increase oxycontin to 20mg  q12 and continue oxycodone prn. Due to hearing impairment adn aphasia , has difficulty  communicating need for prn meds -anxiety is a major issue 4. Mood: Continue cymbalta for pain as well as mood stabilization. Team to provide ego support and encouragement to help with participation. LCSW to follow for evaluation and support.  5. Neuropsych: This patient is not capable of making decisions on her own behalf.  6. Skin/Wound Care: wound clean 7. Fluids/Electrolytes/Nutrition: Monitor intake for adequate hydration and nutritional status.  8. HTN: Will monitor every 8 hours. Continue Coreg and Altace.  9. CAD: Monitor for symptoms with recommendations to add nitrate if needed. Continue coreg bid, Altace and Lipitor.  10. Recent L-MCA infarct residual mild R HP: on Xarelto.  11. Hypokalemia: resolved past supplementation  12. Leucocytosis: Resolved afeb 13. Acute blood loss anemia: Hgb stable   LOS (Days) 7 A FACE TO FACE EVALUATION WAS PERFORMED  SWARTZ,ZACHARY T 05/15/2014, 7:55 AM

## 2014-05-15 NOTE — Progress Notes (Signed)
Occupational Therapy Weekly Progress Note  Patient Details  Name: Traci Mclaughlin MRN: [id number] Date of Birth: 12-02-1950  Beginning of progress report period: May 09, 2014 End of progress report period: May 15, 2014  Today's Date: 05/15/2014 OT Individual Time: [phone] and 1300-1400 OT Individual Time Calculation (min): 60 min and 60 min     No short term goals set due to short ELOS. Patient is currently progressing towards LTGs at this time. Patient currently requires supervision for stand pivot transfers with RW. Patient required min-mod assist for other self-care tasks. Patient's biggest barriers at this time are poor pain tolerance, decreased awareness, and decreased activity tolerance.   Patient continues to demonstrate the following deficits: poor pain tolerance, decreased strength, decreased activity tolerance, decreased balance, decreased awareness and therefore will continue to benefit from skilled OT intervention to enhance overall performance with BADL.  Patient progressing toward long term goals..  Continue plan of care.  OT Short Term Goals Week 2:  OT Short Term Goal 1 (Week 2): STGs = LTGs  Skilled Therapeutic Interventions/Progress Updates:    Session 1: Pt seen for ADL retraining with focus on activity tolerance, standing balance, and functional transfers. Pt received supine in bed requiring max cues of encouragement for participation. Pt sat EOB to eat breakfast at supervision level. Pt completed stand pivot transfer bed>w/c at supervision level using RW. Completed bathing at sink with min cues for hand placement during sit<>stand. Pt required mod assist for LB dressing and max cues of encouragement as pt tearful throughout. Pt allowed therapist to don shrinker this AM and agreeable to leave on for at least 30 min. Pt left sitting in w/c with all needs in reach.  Session 2: Pt seen for 1:1 OT session with focus on functional transfers, functional mobility,  activity tolerance. Pt received supine in bed. Ambulated bed>bathroom with SBA using RW and completed toilet transfer at supervision level. Pt propelled self +200 feet in w/c (on unit and gift shop) with increased time for activity tolerance and BUE strengthening. Pt required min cues and min assist for navigating throughout gift shop and in tight spaces. Pt required multiple rest breaks throughout session d/t fatigue. Pt returned to room and transferred to bed at supervision level. Pt left supine in bed with all needs in reach. Pt much less tearful this PM.   Therapy Documentation Precautions:  Precautions Precautions: Fall Precaution Comments: LLE AKA-HOH- needs written communication Restrictions Weight Bearing Restrictions: Yes LLE Weight Bearing: Non weight bearing Other Position/Activity Restrictions: wears Darco sandal to unweight L toes General:   Vital Signs: Therapy Vitals BP: 122/78 mmHg Pain: Pain Assessment Pain Assessment: 0-10 Pain Score: 10-Worst pain ever Pain Type: Acute pain Pain Location: Leg Pain Orientation: Left Pain Descriptors / Indicators: Grimacing Pain Onset: With Activity (after stump shrinker applied) Pain Intervention(s): Medication (See eMAR)  See FIM for current functional status  Therapy/Group: Individual Therapy  Duayne Cal 05/15/2014, 10:52 AM

## 2014-05-16 ENCOUNTER — Inpatient Hospital Stay (HOSPITAL_COMMUNITY): Payer: Medicare Other

## 2014-05-16 ENCOUNTER — Inpatient Hospital Stay (HOSPITAL_COMMUNITY): Payer: Medicare Other | Admitting: Occupational Therapy

## 2014-05-16 ENCOUNTER — Inpatient Hospital Stay (HOSPITAL_COMMUNITY): Payer: Medicare Other | Admitting: Physical Therapy

## 2014-05-16 LAB — BASIC METABOLIC PANEL
Anion gap: 13 (ref 5–15)
BUN: 15 mg/dL (ref 6–23)
CHLORIDE: 100 meq/L (ref 96–112)
CO2: 25 mEq/L (ref 19–32)
Calcium: 9.5 mg/dL (ref 8.4–10.5)
Creatinine, Ser: 1.41 mg/dL — ABNORMAL HIGH (ref 0.50–1.10)
GFR calc non Af Amer: 39 mL/min — ABNORMAL LOW (ref >90)
GFR, EST AFRICAN AMERICAN: 45 mL/min — AB (ref >90)
Glucose, Bld: 137 mg/dL — ABNORMAL HIGH (ref 70–99)
POTASSIUM: 4.5 meq/L (ref 3.7–5.3)
SODIUM: 138 meq/L (ref 137–147)

## 2014-05-16 LAB — URINALYSIS, ROUTINE W REFLEX MICROSCOPIC
Glucose, UA: NEGATIVE mg/dL
HGB URINE DIPSTICK: NEGATIVE
Ketones, ur: NEGATIVE mg/dL
Leukocytes, UA: NEGATIVE
Nitrite: NEGATIVE
Protein, ur: NEGATIVE mg/dL
Specific Gravity, Urine: 1.022 (ref 1.005–1.030)
Urobilinogen, UA: 0.2 mg/dL (ref 0.0–1.0)
pH: 5.5 (ref 5.0–8.0)

## 2014-05-16 LAB — OCCULT BLOOD X 1 CARD TO LAB, STOOL
FECAL OCCULT BLD: POSITIVE — AB
FECAL OCCULT BLD: POSITIVE — AB

## 2014-05-16 LAB — CBC WITH DIFFERENTIAL/PLATELET
BASOS PCT: 0 % (ref 0–1)
Basophils Absolute: 0 10*3/uL (ref 0.0–0.1)
Eosinophils Absolute: 0.1 10*3/uL (ref 0.0–0.7)
Eosinophils Relative: 1 % (ref 0–5)
HCT: 26.8 % — ABNORMAL LOW (ref 36.0–46.0)
HEMOGLOBIN: 8.7 g/dL — AB (ref 12.0–15.0)
Lymphocytes Relative: 10 % — ABNORMAL LOW (ref 12–46)
Lymphs Abs: 1.5 10*3/uL (ref 0.7–4.0)
MCH: 31.5 pg (ref 26.0–34.0)
MCHC: 32.5 g/dL (ref 30.0–36.0)
MCV: 97.1 fL (ref 78.0–100.0)
MONOS PCT: 9 % (ref 3–12)
Monocytes Absolute: 1.5 10*3/uL — ABNORMAL HIGH (ref 0.1–1.0)
NEUTROS ABS: 13 10*3/uL — AB (ref 1.7–7.7)
NEUTROS PCT: 80 % — AB (ref 43–77)
Platelets: 426 10*3/uL — ABNORMAL HIGH (ref 150–400)
RBC: 2.76 MIL/uL — AB (ref 3.87–5.11)
RDW: 13.6 % (ref 11.5–15.5)
WBC: 16.1 10*3/uL — ABNORMAL HIGH (ref 4.0–10.5)

## 2014-05-16 MED ORDER — ENSURE COMPLETE PO LIQD
237.0000 mL | Freq: Three times a day (TID) | ORAL | Status: DC
  Administered 2014-05-16 – 2014-05-23 (×7): 237 mL via ORAL

## 2014-05-16 MED ORDER — CIPROFLOXACIN HCL 500 MG PO TABS
500.0000 mg | ORAL_TABLET | Freq: Two times a day (BID) | ORAL | Status: DC
  Administered 2014-05-16 – 2014-05-17 (×4): 500 mg via ORAL
  Filled 2014-05-16 (×7): qty 1

## 2014-05-16 MED ORDER — PANTOPRAZOLE SODIUM 40 MG PO TBEC
40.0000 mg | DELAYED_RELEASE_TABLET | Freq: Two times a day (BID) | ORAL | Status: DC
  Administered 2014-05-16 – 2014-05-25 (×18): 40 mg via ORAL
  Filled 2014-05-16 (×21): qty 1

## 2014-05-16 MED ORDER — NALOXONE HCL 0.4 MG/ML IJ SOLN
0.4000 mg | INTRAMUSCULAR | Status: DC | PRN
  Administered 2014-05-16: 0.4 mg via INTRAVENOUS
  Filled 2014-05-16: qty 1

## 2014-05-16 MED ORDER — RIVAROXABAN 15 MG PO TABS
15.0000 mg | ORAL_TABLET | Freq: Every day | ORAL | Status: DC
  Administered 2014-05-16: 15 mg via ORAL
  Filled 2014-05-16 (×2): qty 1

## 2014-05-16 MED ORDER — SODIUM CHLORIDE 0.9 % IV BOLUS (SEPSIS)
500.0000 mL | Freq: Once | INTRAVENOUS | Status: AC
  Administered 2014-05-16: 500 mL via INTRAVENOUS

## 2014-05-16 MED ORDER — OXYCODONE HCL ER 10 MG PO T12A: 10.0000 mg | EXTENDED_RELEASE_TABLET | Freq: Two times a day (BID) | ORAL | Status: DC

## 2014-05-16 MED ORDER — TRAMADOL HCL 50 MG PO TABS
50.0000 mg | ORAL_TABLET | Freq: Four times a day (QID) | ORAL | Status: DC | PRN
  Administered 2014-05-17 – 2014-05-19 (×3): 50 mg via ORAL
  Filled 2014-05-16 (×3): qty 1

## 2014-05-16 MED ORDER — ENSURE PUDDING PO PUDG: 1.0000 | Freq: Three times a day (TID) | ORAL | Status: DC

## 2014-05-16 MED ORDER — SORBITOL 70 % SOLN
60.0000 mL | Freq: Once | Status: AC
  Administered 2014-05-16: 60 mL via ORAL
  Filled 2014-05-16 (×2): qty 60

## 2014-05-16 MED ORDER — SODIUM CHLORIDE 0.9 % IV SOLN
INTRAVENOUS | Status: DC
  Administered 2014-05-16 – 2014-05-17 (×3): via INTRAVENOUS

## 2014-05-16 NOTE — Progress Notes (Signed)
Occupational Therapy Note  Patient Details  Name: Kimimila Tauzin MRN: [id number] Date of Birth: [birth year]  Today's Date: 05/16/2014 OT Missed Time: 29 Minutes Missed Time Reason: MD hold (comment)  Patient missing 60 min skilled OT secondary to medical hold for elevated temperature, pt sweating, and pt not feeling well.    Finnbar Cedillos N 05/16/2014, 3:10 PM

## 2014-05-16 NOTE — Progress Notes (Signed)
Occupational Therapy Session Note  Patient Details  Name: Traci Mclaughlin MRN: [id number] Date of Birth: 04/15/1951  Today's Date: 05/16/2014 OT Individual Time: 1000-1035 OT Individual Time Calculation (min): 35 min (missed 25 min due to pain, not feeling well and declined to participate)  Short Term Goals: Week 2:  OT Short Term Goal 1 (Week 2): STGs = LTGs  Skilled Therapeutic Interventions/Progress Updates:  Patient up in w/c and washing her hands at sink upon arrival with NT and RN in room having just assisted her with toileting.  Patient shook her head No upon seeing this OT and began to cry stating, "I don't feel good" and "I'm not putting on any clothes."  Agreed to wash up at sink and put on a gown then before she began washing up she stated, "Don't you understand, I don't feel good".  When asked what she wanted to do for this therapy session, she stated, "go to bed".  Once in bed she stated that the dressing on her residual limb was too tight and she began to take the dressing off.  Assisted to take off dressing, no drainage noted and patient instructed to use inspection mirror to check the healing process.  Tried to put on an Ace bandage and/or the stump shrinker and patient would not allow either.  A loose gauze was placed on the incision with minimal tape per patient instruction.  RN aware of the above.  Therapy Documentation Precautions:  Precautions Precautions: Fall Precaution Comments: LLE AKA-HOH- needs written communication Pain: Patient often tearful, indicates pain in left residual limb however did not rate the pain.  When asked to rate her pain, patient stated through her tears, "what do you think?"  Therapy/Group: Individual Therapy  Kiran Carline 05/16/2014, 10:45 AM

## 2014-05-16 NOTE — Progress Notes (Signed)
Subjective/Complaints:   No new problems overnight. Pain generally controlled at rest. Increases with increased anxiety Review of Systems - limited by hearing loss and aphasia   Objective: Vital Signs: Blood pressure 118/60, pulse 94, temperature 99.6 F (37.6 C), temperature source Oral, resp. rate 14, weight 73.5 kg (162 lb 0.6 oz), SpO2 96.00%. No results found. No results found for this or any previous visit (from the past 72 hour(s)).    HEENT: poor dentition Cardio: RRR and no murmur Resp: CTA B/L and unlabored GI: BS positive and NT, ND Extremity:  Pulses positive and No Edema. Right foot Skin:   Intact and Wound C/D/I and Left AKA tender Neuro: Flat, Cranial Nerve II-XII normal, Abnormal Motor 4/5 in RUE, 3- R HF, 4/5 R KE, ADF, 5/5 LUE, 3/5 L HF, Abnormal FMC Ataxic/ dec FMC and Dysarthric Musc/Skel:  Swelling Left thigh no erythema. Gen NAD Psych: calm this am   Assessment/Plan: 1. 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 Physiatrist is providing close team supervision and 24 hour management of active medical problems listed below. Physiatrist and rehab team continue to assess barriers to discharge/monitor patient progress toward functional and medical goals. FIM: FIM - Bathing Bathing Steps Patient Completed: Chest;Right Arm;Left Arm;Front perineal area;Right upper leg;Left upper leg;Abdomen;Right lower leg (including foot) Bathing: 4: Min-Patient completes 8-9 60f 10 parts or 75+ percent  FIM - Upper Body Dressing/Undressing Upper body dressing/undressing steps patient completed: Thread/unthread right bra strap;Thread/unthread left bra strap;Hook/unhook bra;Thread/unthread right sleeve of pullover shirt/dresss;Thread/unthread left sleeve of pullover shirt/dress;Put head through opening of pull over shirt/dress;Pull shirt over trunk Upper body  dressing/undressing: 5: Set-up assist to: Obtain clothing/put away FIM - Lower Body Dressing/Undressing Lower body dressing/undressing steps patient completed: Thread/unthread right pants leg;Thread/unthread left pants leg Lower body dressing/undressing: 3: Mod-Patient completed 50-74% of tasks  FIM - Toileting Toileting steps completed by patient: Performs perineal hygiene Toileting Assistive Devices: Grab bar or rail for support Toileting: 2: Max-Patient completed 1 of 3 steps  FIM - Radio producer Devices: Elevated toilet seat;Grab bars;Walker Toilet Transfers: 4-To toilet/BSC: Min A (steadying Pt. > 75%);4-From toilet/BSC: Min A (steadying Pt. > 75%)  FIM - Control and instrumentation engineer Devices: Walker;Arm rests Bed/Chair Transfer: 5: Bed > Chair or W/C: Supervision (verbal cues/safety issues);5: Chair or W/C > Bed: Supervision (verbal cues/safety issues)  FIM - Locomotion: Wheelchair Distance: 200 Locomotion: Wheelchair: 2: Travels 50 - 149 ft with supervision, cueing or coaxing FIM - Locomotion: Ambulation Locomotion: Ambulation Assistive Devices: Administrator Ambulation/Gait Assistance: 5: Supervision Locomotion: Ambulation: 1: Travels less than 50 ft with supervision/safety issues  Comprehension Comprehension Mode: Auditory Comprehension: 5-Understands basic 90% of the time/requires cueing < 10% of the time  Expression Expression Mode: Nonverbal;Verbal Expression Assistive Devices: 6-Communication board Expression: 5-Expresses basic 90% of the time/requires cueing < 10% of the time.  Social Interaction Social Interaction Mode: Asleep Social Interaction: 4-Interacts appropriately 75 - 89% of the time - Needs redirection for appropriate language or to initiate interaction.  Problem Solving Problem Solving: 4-Solves basic 75 - 89% of the time/requires cueing 10 - 24% of the time  Memory Memory: 5-Recognizes or recalls  90% of the time/requires cueing < 10% of the time  Medical Problem List and Plan:  1. 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  2. LLE DVT/Anticoagulation: Pharmaceutical: Xarelto  3. Pain Management: increase oxycontin to  20mg  q12 and continue oxycodone prn. Due to hearing impairment adn aphasia , has difficulty communicating need for prn meds -anxiety is a major issue 4. Mood: Continue cymbalta for pain as well as mood stabilization. Team to provide ego support and encouragement to help with participation. LCSW to follow for evaluation and support.  5. Neuropsych: This patient is not capable of making decisions on her own behalf.  6. Skin/Wound Care: wound clean 7. Fluids/Electrolytes/Nutrition: Monitor intake for adequate hydration and nutritional status.  8. HTN: Will monitor every 8 hours. Continue Coreg and Altace.  9. CAD: Monitor for symptoms with recommendations to add nitrate if needed. Continue coreg bid, Altace and Lipitor.  10. Recent L-MCA infarct residual mild R HP: on Xarelto.  11. Hypokalemia: resolved past supplementation  12. Leucocytosis: Resolved. Low grade temp this morning 13. Acute blood loss anemia: Hgb stable   LOS (Days) 8 A FACE TO FACE EVALUATION WAS PERFORMED  SWARTZ,ZACHARY T 05/16/2014, 7:46 AM

## 2014-05-16 NOTE — Progress Notes (Addendum)
Patient has felt bad all day. Nursing reports no BM since 10/15 and has refused suppository. Spiked temp of 100.1 around lunch and reported to be lethargic. Will change ultram to prn. Check KUB to rule out obstipation as underlying cause of malaise. Check labs and start antibiotics empirically.

## 2014-05-16 NOTE — Progress Notes (Signed)
Patient attempting to move bowels, second attempt this morning, unable to provide PRN suppository d/t last suppository given was 05/15/14 at 1400.  Sinclair Alligood

## 2014-05-16 NOTE — Progress Notes (Signed)
Walked into pts room to give 12:00 medications, and found pt very diaphoretic, weak, and difficult to arouse. Called Alg[facility] Liming, [place]. Rapid response was called and we started an IV, gave a bolus of 0.9% NS, and Narcan. Pt passed two extra large liquid stools, and said she felt better. Sent occult blood stool to lab, got x-rays of chest and abdomen, and labs drawn. Monitored vital signs q15 min for 1 hour, blood pressure rose to 110/65, and was able to communicate with staff. Pt is no longer diaphoretic, and is resting. Will continue to monitor pt.

## 2014-05-16 NOTE — Progress Notes (Signed)
Physical Therapy Note  Patient Details  Name: Traci Mclaughlin MRN: [id number] Date of Birth: 1951/03/06 Today's Date: 05/16/2014    Pt missed 60 minutes of PT due to medical hold for elevated temperature, pt sweating, and pt feeling too poor to participate.    [facility] M 05/16/2014, 1:30 PM

## 2014-05-17 ENCOUNTER — Inpatient Hospital Stay (HOSPITAL_COMMUNITY): Payer: Medicare Other | Admitting: Physical Therapy

## 2014-05-17 ENCOUNTER — Inpatient Hospital Stay (HOSPITAL_COMMUNITY): Payer: Medicare Other | Admitting: Occupational Therapy

## 2014-05-17 ENCOUNTER — Inpatient Hospital Stay (HOSPITAL_COMMUNITY): Payer: Medicare Other

## 2014-05-17 ENCOUNTER — Encounter: Payer: Self-pay | Admitting: Cardiovascular Disease

## 2014-05-17 DIAGNOSIS — K922 Gastrointestinal hemorrhage, unspecified: Secondary | ICD-10-CM

## 2014-05-17 DIAGNOSIS — N179 Acute kidney failure, unspecified: Secondary | ICD-10-CM

## 2014-05-17 LAB — CBC
HCT: 25.4 % — ABNORMAL LOW (ref 36.0–46.0)
Hemoglobin: 8.2 g/dL — ABNORMAL LOW (ref 12.0–15.0)
MCH: 31.4 pg (ref 26.0–34.0)
MCHC: 32.3 g/dL (ref 30.0–36.0)
MCV: 97.3 fL (ref 78.0–100.0)
PLATELETS: 387 10*3/uL (ref 150–400)
RBC: 2.61 MIL/uL — ABNORMAL LOW (ref 3.87–5.11)
RDW: 13.7 % (ref 11.5–15.5)
WBC: 15.9 10*3/uL — AB (ref 4.0–10.5)

## 2014-05-17 LAB — BASIC METABOLIC PANEL
ANION GAP: 13 (ref 5–15)
BUN: 11 mg/dL (ref 6–23)
CALCIUM: 9 mg/dL (ref 8.4–10.5)
CO2: 24 mEq/L (ref 19–32)
CREATININE: 1.12 mg/dL — AB (ref 0.50–1.10)
Chloride: 103 mEq/L (ref 96–112)
GFR calc non Af Amer: 51 mL/min — ABNORMAL LOW (ref >90)
GFR, EST AFRICAN AMERICAN: 59 mL/min — AB (ref >90)
Glucose, Bld: 119 mg/dL — ABNORMAL HIGH (ref 70–99)
Potassium: 4.5 mEq/L (ref 3.7–5.3)
SODIUM: 140 meq/L (ref 137–147)

## 2014-05-17 LAB — URINE CULTURE
Colony Count: NO GROWTH
Culture: NO GROWTH

## 2014-05-17 LAB — PROTIME-INR
INR: 2.72 — AB (ref 0.00–1.49)
PROTHROMBIN TIME: 29.1 s — AB (ref 11.6–15.2)

## 2014-05-17 LAB — OCCULT BLOOD X 1 CARD TO LAB, STOOL: FECAL OCCULT BLD: POSITIVE — AB

## 2014-05-17 MED ORDER — WHITE PETROLATUM GEL: Status: DC | PRN

## 2014-05-17 NOTE — Progress Notes (Signed)
Social Work Patient ID: Traci Mclaughlin, female   DOB: [birth year], 63 y.o.   MRN: [id number]  CSW met with pt and spoke with her dtr over the phone to inform them of pt's d/c date of 05-19-14 and the need for family education.  This was scheduled for today, but dtr and her boyfriend did not come for education.  CSW called to follow up and left a message for dtr with no return call back at time of this writing.  CSW then learned that GI wants to keep pt for a few more days to continue to monitor pt's possible GI bleed.  CSW will continue to follow pt and assist with pt's return to home once she is stable.  Will reschedule family education.

## 2014-05-17 NOTE — Consult Note (Signed)
Chief Complaint: LLE DVT L AKA 05/05/14 New GI Bleed  Referring Physician(s): Naaman Plummer  History of Present Illness: Traci Mclaughlin is a 63 y.o. female  Pt admitted with Peripheral arterial disease Required L AKA 05/05/14 New CVA and on Xarelto New LLE DVT: non occlusive femoral and popliteal veins; acute superficial thrombus L greater saphenous vein  Developed GI bleed and must come off anticoagulants Just decreasing Xarelto dose initially til can discontinue Request made for IR consult for Inferior vena cava filter placement Dr Pascal Lux has reviewed chart and imaging Feels pt appropriate for procedure Now scheduled for same I have seen and examined pt Cr: 1.4---rechecking  Past Medical History  Diagnosis Date  . Hypertension   . HOH (hard of hearing)   . CVA (cerebral infarction)   . PAD (peripheral artery disease) 04/10/2014  . Cancer     cervical; was on chemo; has been in remission for 10yrs  . Depression 04/19/2014  . Hx of cardiovascular stress test     Nuclear (9/15):  Inf ischemia, EF 35%; high risk  . Stroke Aug. 10, 2015  . STEMI (ST elevation myocardial infarction) 06/2011  . RBBB (right bundle branch block) 05/02/2014  . Chronic kidney disease     daughter says no,they checked them,okay    Past Surgical History  Procedure Laterality Date  . Cardiac catheterization  07/03/2011    Dr. Irish Lack  . Tee without cardioversion N/A 03/08/2014    Procedure: TRANSESOPHAGEAL ECHOCARDIOGRAM (TEE);  Surgeon: Fay Records, MD;  Location: [facility] ENDOSCOPY;  Service: Cardiovascular;  Laterality: N/A;  . 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 Amputation Left 05/04/2014    Procedure: AMPUTATION ABOVE KNEE;  Surgeon: Angelia Mould, MD;  Location: [place];  Service: Vascular;  Laterality: Left;    Allergies: Review of patient's allergies indicates no known allergies.  Medications: Prior to Admission medications     Medication Sig Start Date End Date Taking? Authorizing Provider  atorvastatin (LIPITOR) 40 MG tablet Take 40 mg by mouth daily at 6 PM. 03/23/14   Ivan Anchors Love, PA-C  carvedilol (COREG) 6.25 MG tablet Take 3 tablets (18.75 mg total) by mouth 2 (two) times daily with a meal. 05/08/14   Modena Jansky, MD  docusate sodium 100 MG CAPS Take 100 mg by mouth 2 (two) times daily. 03/23/14   Bary Leriche, PA-C  DULoxetine (CYMBALTA) 30 MG capsule Take 1 capsule (30 mg total) by mouth daily. 04/19/14   [place], PA-C  oxyCODONE-acetaminophen (PERCOCET) 10-325 MG per tablet Take 1 tablet by mouth every 6 (six) hours as needed for pain. 04/01/14   Varney Biles, MD  ramipril (ALTACE) 5 MG capsule Take 1 capsule (5 mg total) by mouth daily. 05/08/14   Modena Jansky, MD  rivaroxaban (XARELTO) 20 MG TABS tablet Take 1 tablet (20 mg total) by mouth daily with supper. 04/19/14   Meriam Sprague Saguier, PA-C    Family History  Problem Relation Age of Onset  . Intracerebral hemorrhage Daughter     during childbirth  . Stroke Daughter   . Heart attack Neg Hx   . Diabetes Mother   . Alcohol abuse Mother   . Lung cancer Father   . COPD Father     History   Social History  . Marital Status: Single    Spouse Name: N/A    Number of Children: N/A  .  Years of Education: N/A   Social History Main Topics  . Smoking status: Former Smoker -- 1.00 packs/day for 1 years    Types: Cigarettes  . Smokeless tobacco: Former Systems developer    Quit date: 06/16/2011  . Alcohol Use: 3.0 oz/week    5 Glasses of wine per week     Comment: occassional  . Drug Use: No  . Sexual Activity: No   Other Topics Concern  . None   Social History Narrative  . None    Review of Systems: A 12 point ROS discussed and pertinent positives are indicated in the HPI above.  All other systems are negative.  Review of Systems  Constitutional: Positive for activity change, appetite change and fatigue.  HENT: Positive for dental  problem and hearing loss.        Deaf; can read lips some  Respiratory: Negative for cough, chest tightness and shortness of breath.   Cardiovascular: Negative for chest pain.  Gastrointestinal: Positive for nausea, blood in stool and anal bleeding.  Genitourinary: Negative for difficulty urinating.  Musculoskeletal: Negative for back pain.  Psychiatric/Behavioral: Negative for behavioral problems and confusion.    Vital Signs: BP 140/80  Pulse 95  Temp(Src) 98.6 F (37 C) (Oral)  Resp 16  Wt 74.1 kg (163 lb 5.8 oz)  SpO2 97%  Physical Exam  Constitutional: She is oriented to person, place, and time. She appears well-nourished.  Cardiovascular: Normal rate, regular rhythm and normal heart sounds.   Pulmonary/Chest: Effort normal and breath sounds normal.  Abdominal: Soft. Bowel sounds are normal. There is no tenderness.  Musculoskeletal: Normal range of motion.  L AKA  Neurological: She is alert and oriented to person, place, and time.  Difficult to communicate Limited by deafness  Skin: Skin is warm and dry.  Psychiatric: She has a normal mood and affect. Her behavior is normal. Judgment and thought content normal.  Consented with dtr via phone    Imaging: Dg Chest Port 1 View  05/16/2014   CLINICAL DATA:  Fever.  EXAM: PORTABLE CHEST - 1 VIEW  COMPARISON:  03/05/2014  FINDINGS: There is no evidence of pulmonary edema, consolidation, pneumothorax, nodule or pleural fluid. The heart size is normal. There has been interval placement of a cardiac loop recorder device in the left chest wall. Stable mild tortuosity of the thoracic aortic  IMPRESSION: No active disease.  Interval placement of loop recorder device.   Electronically Signed   By: Aletta Edouard M.D.   On: 05/16/2014 13:47   Dg Abd Portable 1v  05/16/2014   CLINICAL DATA:  Constipation.  EXAM: PORTABLE ABDOMEN - 1 VIEW  COMPARISON:  None.  FINDINGS: The bowel gas pattern is normal. Phleboliths are noted in the  pelvis. No significant stool burden is noted. Degenerative changes are noted in the lower lumbar spine.  IMPRESSION: No significant stool burden is seen. There is no evidence of bowel obstruction or ileus.   Electronically Signed   By: Sabino Dick M.D.   On: 05/16/2014 13:34    Labs:  CBC:  Recent Labs  05/06/14 0400 05/09/14 0650 05/16/14 1415 05/17/14 0424  WBC 12.9* 8.9 16.1* 15.9*  HGB 9.1* 9.0* 8.7* 8.2*  HCT 26.5* 27.0* 26.8* 25.4*  PLT 372 423* 426* 387    COAGS:  Recent Labs  03/05/14 1508 04/01/14 1100  05/02/14 0354 05/03/14 0350 05/04/14 0010 05/05/14 0219  INR 1.07 1.59*  --   --  1.38  --   --  APTT 40* 60*  < > 93* 76* 80* 77*  < > = values in this interval not displayed.  BMP:  Recent Labs  05/06/14 0400 05/07/14 0621 05/09/14 0650 05/16/14 1415  NA 137 137 141 138  K 3.3* 3.8 3.5* 4.5  CL 98 100 101 100  CO2 24 23 26 25   GLUCOSE 128* 111* 102* 137*  BUN 6 7 10 15   CALCIUM 8.6 8.9 9.4 9.5  CREATININE 0.72 0.90 0.95 1.41*  GFRNONAA 89* 67* 62* 39*  GFRAA >90 77* 72* 45*    LIVER FUNCTION TESTS:  Recent Labs  03/13/14 0519 04/19/14 1420 04/30/14 1821 05/09/14 0650  BILITOT 0.5 0.5 0.4 0.3  AST 27 41* 29 39*  ALT 17 43* 25 40*  ALKPHOS 58 52 65 55  PROT 7.2 8.3 8.2 6.8  ALBUMIN 3.3* 3.1* 2.8* 2.3*    TUMOR MARKERS: No results found for this basename: AFPTM, CEA, CA199, CHROMGRNA,  in the last 8760 hours  Assessment and Plan:  PAD; L AKA New LLE DVT CVA--on Xarelto New GI bleed/anemia Now scheduled for retrievable IVC filter placement Pt and dtr aware of procedure benefits and risks and agreeable to proceed Consent signed andin chart  Thank you for this interesting consult.  I greatly enjoyed meeting Traci Mclaughlin and look forward to participating in their care.    I spent a total of 40 minutes face to face in clinical consultation, greater than 50% of which was counseling/coordinating care for retrievable IVC  filter  Signed: Haedyn Ancrum A 05/17/2014, 10:31 AM

## 2014-05-17 NOTE — Consult Note (Signed)
[facility] Consult Note  Referring Provider: No ref. provider found Primary Care Physician:  [name], PA-C Primary Gastroenterologist:  Dr.  Laurel Dimmer Complaint: Rectal bleeding HPI: Traci Mclaughlin is an 63 y.o. black female  with recent stroke recent lower extremity amputation and development of a DVT who had gone about 5 or 6 days without a bowel movement. She was given sorbitol and had dark brown stool with bright red blood and has had several more episodes of bright red blood since then. She felt clammy and was slightly febrile yesterday but feels better today. She denies any pain. She is deaf and most of my communication with her was by writing on a board . She understands my questions correctly she has never had a colonoscopy before Past Medical History  Diagnosis Date  . Hypertension   . HOH (hard of hearing)   . CVA (cerebral infarction)   . PAD (peripheral artery disease) 04/10/2014  . Cancer     cervical; was on chemo; has been in remission for 39yr  . Depression 04/19/2014  . Hx of cardiovascular stress test     Nuclear (9/15):  Inf ischemia, EF 35%; high risk  . Stroke Aug. 10, 2015  . STEMI (ST elevation myocardial infarction) 06/2011  . RBBB (right bundle branch block) 05/02/2014  . Chronic kidney disease     daughter says no,they checked them,okay    Past Surgical History  Procedure Laterality Date  . Cardiac catheterization  07/03/2011    Dr. VIrish Lack . Tee without cardioversion N/A 03/08/2014    Procedure: TRANSESOPHAGEAL ECHOCARDIOGRAM (TEE);  Surgeon: PFay Records MD;  [facility];  Service: Cardiovascular;  Laterality: N/A;  . 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 Amputation Left 05/04/2014    Procedure: AMPUTATION ABOVE KNEE;  Surgeon: CAngelia Mould MD;  [facility]  Service: Vascular;  Laterality: Left;    Medications Prior to Admission  Medication Sig  Dispense Refill  . atorvastatin (LIPITOR) 40 MG tablet Take 40 mg by mouth daily at 6 PM.      . carvedilol (COREG) 6.25 MG tablet Take 3 tablets (18.75 mg total) by mouth 2 (two) times daily with a meal.      . docusate sodium 100 MG CAPS Take 100 mg by mouth 2 (two) times daily.  30 capsule  0  . DULoxetine (CYMBALTA) 30 MG capsule Take 1 capsule (30 mg total) by mouth daily.  30 capsule  3  . oxyCODONE-acetaminophen (PERCOCET) 10-325 MG per tablet Take 1 tablet by mouth every 6 (six) hours as needed for pain.  30 tablet  0  . ramipril (ALTACE) 5 MG capsule Take 1 capsule (5 mg total) by mouth daily.      . rivaroxaban (XARELTO) 20 MG TABS tablet Take 1 tablet (20 mg total) by mouth daily with supper.  30 tablet  1    Allergies: No Known Allergies  Family History  Problem Relation Age of Onset  . Intracerebral hemorrhage Daughter     during childbirth  . Stroke Daughter   . Heart attack Neg Hx   . Diabetes Mother   . Alcohol abuse Mother   . Lung cancer Father   . COPD Father     Social History:  reports that she has quit smoking. Her smoking use included Cigarettes. She has a 1 pack-year smoking history. She quit smokeless tobacco use about 2 years  ago. She reports that she drinks about 3 ounces of alcohol per week. She reports that she does not use illicit drugs.  Review of Systems: negative except as above, limited by her deafness   Blood pressure 126/56, pulse 82, temperature 98.6 F (37 C), temperature source Oral, resp. rate 16, weight 74.1 kg (163 lb 5.8 oz), SpO2 100.00%. Head: Normocephalic, without obvious abnormality, atraumatic Neck: no adenopathy, no carotid bruit, no JVD, supple, symmetrical, trachea midline and thyroid not enlarged, symmetric, no tenderness/mass/nodules Resp: clear to auscultation bilaterally Cardio: regular rate and rhythm, S1, S2 normal, no murmur, click, rub or gallop GI: Abdomen soft nondistended with normoactive bowel sounds. No  hepatosplenomegaly mass or guarding. Extremities: extremities normal, atraumatic, no cyanosis or edema  Results for orders placed during the hospital encounter of 05/08/14 (from the past 48 hour(s))  OCCULT BLOOD X 1 CARD TO LAB, STOOL     Status: Abnormal   Collection Time    05/16/14 12:52 PM      Result Value Ref Range   Fecal Occult Bld POSITIVE (*) NEGATIVE  OCCULT BLOOD X 1 CARD TO LAB, STOOL     Status: Abnormal   Collection Time    05/16/14  2:13 PM      Result Value Ref Range   Fecal Occult Bld POSITIVE (*) NEGATIVE  CBC WITH DIFFERENTIAL     Status: Abnormal   Collection Time    05/16/14  2:15 PM      Result Value Ref Range   WBC 16.1 (*) 4.0 - 10.5 K/uL   RBC 2.76 (*) 3.87 - 5.11 MIL/uL   Hemoglobin 8.7 (*) 12.0 - 15.0 g/dL   HCT 26.8 (*) 36.0 - 46.0 %   MCV 97.1  78.0 - 100.0 fL   MCH 31.5  26.0 - 34.0 pg   MCHC 32.5  30.0 - 36.0 g/dL   RDW 13.6  11.5 - 15.5 %   Platelets 426 (*) 150 - 400 K/uL   Neutrophils Relative % 80 (*) 43 - 77 %   Neutro Abs 13.0 (*) 1.7 - 7.7 K/uL   Lymphocytes Relative 10 (*) 12 - 46 %   Lymphs Abs 1.5  0.7 - 4.0 K/uL   Monocytes Relative 9  3 - 12 %   Monocytes Absolute 1.5 (*) 0.1 - 1.0 K/uL   Eosinophils Relative 1  0 - 5 %   Eosinophils Absolute 0.1  0.0 - 0.7 K/uL   Basophils Relative 0  0 - 1 %   Basophils Absolute 0.0  0.0 - 0.1 K/uL  BASIC METABOLIC PANEL     Status: Abnormal   Collection Time    05/16/14  2:15 PM      Result Value Ref Range   Sodium 138  137 - 147 mEq/L   Potassium 4.5  3.7 - 5.3 mEq/L   Chloride 100  96 - 112 mEq/L   CO2 25  19 - 32 mEq/L   Glucose, Bld 137 (*) 70 - 99 mg/dL   BUN 15  6 - 23 mg/dL   Creatinine, Ser 1.41 (*) 0.50 - 1.10 mg/dL   Calcium 9.5  8.4 - 10.5 mg/dL   GFR calc non Af Amer 39 (*) >90 mL/min   GFR calc Af Amer 45 (*) >90 mL/min   Comment: (NOTE)     The eGFR has been calculated using the CKD EPI equation.     This calculation has not been validated in all clinical situations.  eGFR's persistently <90 mL/min signify possible Chronic Kidney     Disease.   Anion gap 13  5 - 15  URINALYSIS, ROUTINE W REFLEX MICROSCOPIC     Status: Abnormal   Collection Time    05/16/14  5:41 PM      Result Value Ref Range   Color, Urine AMBER (*) YELLOW   Comment: BIOCHEMICALS MAY BE AFFECTED BY COLOR   APPearance CLEAR  CLEAR   Specific Gravity, Urine 1.022  1.005 - 1.030   pH 5.5  5.0 - 8.0   Glucose, UA NEGATIVE  NEGATIVE mg/dL   Hgb urine dipstick NEGATIVE  NEGATIVE   Bilirubin Urine SMALL (*) NEGATIVE   Ketones, ur NEGATIVE  NEGATIVE mg/dL   Protein, ur NEGATIVE  NEGATIVE mg/dL   Urobilinogen, UA 0.2  0.0 - 1.0 mg/dL   Nitrite NEGATIVE  NEGATIVE   Leukocytes, UA NEGATIVE  NEGATIVE   Comment: MICROSCOPIC NOT DONE ON URINES WITH NEGATIVE PROTEIN, BLOOD, LEUKOCYTES, NITRITE, OR GLUCOSE <1000 mg/dL.  CBC     Status: Abnormal   Collection Time    05/17/14  4:24 AM      Result Value Ref Range   WBC 15.9 (*) 4.0 - 10.5 K/uL   RBC 2.61 (*) 3.87 - 5.11 MIL/uL   Hemoglobin 8.2 (*) 12.0 - 15.0 g/dL   HCT 25.4 (*) 36.0 - 46.0 %   MCV 97.3  78.0 - 100.0 fL   MCH 31.4  26.0 - 34.0 pg   MCHC 32.3  30.0 - 36.0 g/dL   RDW 13.7  11.5 - 15.5 %   Platelets 387  150 - 400 K/uL  PROTIME-INR     Status: Abnormal   Collection Time    05/17/14 10:28 AM      Result Value Ref Range   Prothrombin Time 29.1 (*) 11.6 - 15.2 seconds   INR 2.72 (*) 0.00 - 1.44  BASIC METABOLIC PANEL     Status: Abnormal   Collection Time    05/17/14 10:28 AM      Result Value Ref Range   Sodium 140  137 - 147 mEq/L   Potassium 4.5  3.7 - 5.3 mEq/L   Chloride 103  96 - 112 mEq/L   CO2 24  19 - 32 mEq/L   Glucose, Bld 119 (*) 70 - 99 mg/dL   BUN 11  6 - 23 mg/dL   Creatinine, Ser 1.12 (*) 0.50 - 1.10 mg/dL   Calcium 9.0  8.4 - 10.5 mg/dL   GFR calc non Af Amer 51 (*) >90 mL/min   GFR calc Af Amer 59 (*) >90 mL/min   Comment: (NOTE)     The eGFR has been calculated using the CKD EPI equation.      This calculation has not been validated in all clinical situations.     eGFR's persistently <90 mL/min signify possible Chronic Kidney     Disease.   Anion gap 13  5 - 15   Dg Chest Port 1 View  05/16/2014   CLINICAL DATA:  Fever.  EXAM: PORTABLE CHEST - 1 VIEW  COMPARISON:  03/05/2014  FINDINGS: There is no evidence of pulmonary edema, consolidation, pneumothorax, nodule or pleural fluid. The heart size is normal. There has been interval placement of a cardiac loop recorder device in the left chest wall. Stable mild tortuosity of the thoracic aortic  IMPRESSION: No active disease.  Interval placement of loop recorder device.   Electronically Signed   By: Eulas Post  Kathlene Cote M.D.   On: 05/16/2014 13:47   Dg Abd Portable 1v  05/16/2014   CLINICAL DATA:  Constipation.  EXAM: PORTABLE ABDOMEN - 1 VIEW  COMPARISON:  None.  FINDINGS: The bowel gas pattern is normal. Phleboliths are noted in the pelvis. No significant stool burden is noted. Degenerative changes are noted in the lower lumbar spine.  IMPRESSION: No significant stool burden is seen. There is no evidence of bowel obstruction or ileus.   Electronically Signed   By: Sabino Dick M.D.   On: 05/16/2014 13:34    Assessment: Rectal bleeding while on xarelto, possibly related to constipation/impaction or possible stercoral ulcer versus diverticular bleed Plan:  Patient had Greenfield filter placed tomorrow. Would like to wait 2 or 3 days off of xarelto to see if bleeding will stop on its own rather than perform urgent colonoscopy.  will eventually need colonoscopy which could be done during this hospitalization. I am told that there is a need to have her on Plavix and aspirin for cerebrovascular disease even independently of the DVT and Greenfield filter. I will put her on a clear liquid diet but currently do not plan on doing colonoscopy tomorrow. Hopefully I can see her when her family is around to help facilitate communication Duke Weisensel  C 05/17/2014, 11:30 AM

## 2014-05-17 NOTE — Progress Notes (Addendum)
Subjective/Complaints:   More alert this am. Was lethargic yesterday with temp. States she's feeling better this morning. Review of Systems - limited by hearing loss and aphasia   Objective: Vital Signs: Blood pressure 95/65, pulse 90, temperature 98.6 F (37 C), temperature source Oral, resp. rate 16, weight 74.1 kg (163 lb 5.8 oz), SpO2 91.00%. Dg Chest Port 1 View  05/16/2014   CLINICAL DATA:  Fever.  EXAM: PORTABLE CHEST - 1 VIEW  COMPARISON:  03/05/2014  FINDINGS: There is no evidence of pulmonary edema, consolidation, pneumothorax, nodule or pleural fluid. The heart size is normal. There has been interval placement of a cardiac loop recorder device in the left chest wall. Stable mild tortuosity of the thoracic aortic  IMPRESSION: No active disease.  Interval placement of loop recorder device.   Electronically Signed   By: Aletta Edouard M.D.   On: 05/16/2014 13:47   Dg Abd Portable 1v  05/16/2014   CLINICAL DATA:  Constipation.  EXAM: PORTABLE ABDOMEN - 1 VIEW  COMPARISON:  None.  FINDINGS: The bowel gas pattern is normal. Phleboliths are noted in the pelvis. No significant stool burden is noted. Degenerative changes are noted in the lower lumbar spine.  IMPRESSION: No significant stool burden is seen. There is no evidence of bowel obstruction or ileus.   Electronically Signed   By: Sabino Dick M.D.   On: 05/16/2014 13:34   Results for orders placed during the hospital encounter of 05/08/14 (from the past 72 hour(s))  OCCULT BLOOD X 1 CARD TO LAB, STOOL     Status: Abnormal   Collection Time    05/16/14 12:52 PM      Result Value Ref Range   Fecal Occult Bld POSITIVE (*) NEGATIVE  OCCULT BLOOD X 1 CARD TO LAB, STOOL     Status: Abnormal   Collection Time    05/16/14  2:13 PM      Result Value Ref Range   Fecal Occult Bld POSITIVE (*) NEGATIVE  CBC WITH DIFFERENTIAL     Status: Abnormal   Collection Time    05/16/14  2:15 PM      Result Value Ref Range   WBC 16.1 (*) 4.0 -  10.5 K/uL   RBC 2.76 (*) 3.87 - 5.11 MIL/uL   Hemoglobin 8.7 (*) 12.0 - 15.0 g/dL   HCT 26.8 (*) 36.0 - 46.0 %   MCV 97.1  78.0 - 100.0 fL   MCH 31.5  26.0 - 34.0 pg   MCHC 32.5  30.0 - 36.0 g/dL   RDW 13.6  11.5 - 15.5 %   Platelets 426 (*) 150 - 400 K/uL   Neutrophils Relative % 80 (*) 43 - 77 %   Neutro Abs 13.0 (*) 1.7 - 7.7 K/uL   Lymphocytes Relative 10 (*) 12 - 46 %   Lymphs Abs 1.5  0.7 - 4.0 K/uL   Monocytes Relative 9  3 - 12 %   Monocytes Absolute 1.5 (*) 0.1 - 1.0 K/uL   Eosinophils Relative 1  0 - 5 %   Eosinophils Absolute 0.1  0.0 - 0.7 K/uL   Basophils Relative 0  0 - 1 %   Basophils Absolute 0.0  0.0 - 0.1 K/uL  BASIC METABOLIC PANEL     Status: Abnormal   Collection Time    05/16/14  2:15 PM      Result Value Ref Range   Sodium 138  137 - 147 mEq/L   Potassium 4.5  3.7 -  5.3 mEq/L   Chloride 100  96 - 112 mEq/L   CO2 25  19 - 32 mEq/L   Glucose, Bld 137 (*) 70 - 99 mg/dL   BUN 15  6 - 23 mg/dL   Creatinine, Ser 1.41 (*) 0.50 - 1.10 mg/dL   Calcium 9.5  8.4 - 10.5 mg/dL   GFR calc non Af Amer 39 (*) >90 mL/min   GFR calc Af Amer 45 (*) >90 mL/min   Comment: (NOTE)     The eGFR has been calculated using the CKD EPI equation.     This calculation has not been validated in all clinical situations.     eGFR's persistently <90 mL/min signify possible Chronic Kidney     Disease.   Anion gap 13  5 - 15  URINALYSIS, ROUTINE W REFLEX MICROSCOPIC     Status: Abnormal   Collection Time    05/16/14  5:41 PM      Result Value Ref Range   Color, Urine AMBER (*) YELLOW   Comment: BIOCHEMICALS MAY BE AFFECTED BY COLOR   APPearance CLEAR  CLEAR   Specific Gravity, Urine 1.022  1.005 - 1.030   pH 5.5  5.0 - 8.0   Glucose, UA NEGATIVE  NEGATIVE mg/dL   Hgb urine dipstick NEGATIVE  NEGATIVE   Bilirubin Urine SMALL (*) NEGATIVE   Ketones, ur NEGATIVE  NEGATIVE mg/dL   Protein, ur NEGATIVE  NEGATIVE mg/dL   Urobilinogen, UA 0.2  0.0 - 1.0 mg/dL   Nitrite NEGATIVE   NEGATIVE   Leukocytes, UA NEGATIVE  NEGATIVE   Comment: MICROSCOPIC NOT DONE ON URINES WITH NEGATIVE PROTEIN, BLOOD, LEUKOCYTES, NITRITE, OR GLUCOSE <1000 mg/dL.  CBC     Status: Abnormal   Collection Time    05/17/14  4:24 AM      Result Value Ref Range   WBC 15.9 (*) 4.0 - 10.5 K/uL   RBC 2.61 (*) 3.87 - 5.11 MIL/uL   Hemoglobin 8.2 (*) 12.0 - 15.0 g/dL   HCT 25.4 (*) 36.0 - 46.0 %   MCV 97.3  78.0 - 100.0 fL   MCH 31.4  26.0 - 34.0 pg   MCHC 32.3  30.0 - 36.0 g/dL   RDW 13.7  11.5 - 15.5 %   Platelets 387  150 - 400 K/uL      HEENT: poor dentition Cardio: RRR and no murmur Resp: CTA B/L and unlabored GI: BS positive and NT, ND Extremity:  Pulses positive and No Edema. Right foot Skin:   Intact and Wound C/D/I also. Neuro: Flat, Cranial Nerve II-XII normal, Abnormal Motor 4/5 in RUE, 3- R HF, 4/5 R KE, ADF, 5/5 LUE, 3/5 L HF, Abnormal FMC Ataxic/ dec FMC and Dysarthric Musc/Skel:  Swelling Left thigh no erythema. Gen NAD Psych: calm this am   Assessment/Plan: 1. 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 Physiatrist is providing close team supervision and 24 hour management of active medical problems listed below. Physiatrist and rehab team continue to assess barriers to discharge/monitor patient progress toward functional and medical goals. FIM: FIM - Bathing Bathing Steps Patient Completed: Chest;Right Arm;Left Arm;Front perineal area;Right upper leg;Left upper leg;Abdomen;Right lower leg (including foot) Bathing: 4: Min-Patient completes 8-9 [id number] parts or 75+ percent  FIM - Upper Body Dressing/Undressing Upper body dressing/undressing steps patient completed: Thread/unthread right bra strap;Thread/unthread left bra strap;Hook/unhook bra;Thread/unthread right sleeve of pullover  shirt/dresss;Thread/unthread left sleeve of pullover shirt/dress;Put head through  opening of pull over shirt/dress;Pull shirt over trunk Upper body dressing/undressing: 5: Set-up assist to: Obtain clothing/put away FIM - Lower Body Dressing/Undressing Lower body dressing/undressing steps patient completed: Thread/unthread right pants leg;Thread/unthread left pants leg Lower body dressing/undressing: 3: Mod-Patient completed 50-74% of tasks  FIM - Toileting Toileting steps completed by patient: Performs perineal hygiene Toileting Assistive Devices: Grab bar or rail for support Toileting: 3: Mod-Patient completed 2 of 3 steps  FIM - Radio producer Devices: Elevated toilet seat;Grab bars;Walker Toilet Transfers: 4-To toilet/BSC: Min A (steadying Pt. > 75%);4-From toilet/BSC: Min A (steadying Pt. > 75%)  FIM - Control and instrumentation engineer Devices: Walker;Arm rests Bed/Chair Transfer: 5: Bed > Chair or W/C: Supervision (verbal cues/safety issues);5: Chair or W/C > Bed: Supervision (verbal cues/safety issues)  FIM - Locomotion: Wheelchair Distance: 200 Locomotion: Wheelchair: 2: Travels 2 - 149 ft with supervision, cueing or coaxing FIM - Locomotion: Ambulation Locomotion: Ambulation Assistive Devices: Administrator Ambulation/Gait Assistance: 5: Supervision Locomotion: Ambulation: 1: Travels less than 50 ft with supervision/safety issues  Comprehension Comprehension Mode: Visual;Auditory Comprehension: 5-Understands basic 90% of the time/requires cueing < 10% of the time  Expression Expression Mode: Nonverbal;Verbal Expression Assistive Devices: 6-Communication board Expression: 5-Expresses basic 90% of the time/requires cueing < 10% of the time.  Social Interaction Social Interaction Mode: Asleep Social Interaction: 4-Interacts appropriately 75 - 89% of the time - Needs redirection for appropriate language or to initiate interaction.  Problem Solving Problem Solving: 4-Solves basic 75 - 89% of the  time/requires cueing 10 - 24% of the time  Memory Memory: 5-Recognizes or recalls 90% of the time/requires cueing < 10% of the time  Medical Problem List and Plan:  1. 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  2. LLE DVT/Anticoagulation: Pharmaceutical: Xarelto (decreased)  -will place IVCF and ultimately stop xarelto once placed---resume asa/plavix? 3. Pain Management:  -oxycontin stopped due to lethargy  -continue oxycodone prn. Due to hearing impairment adn aphasia , has difficulty communicating need for prn meds -anxiety is a major issue 4. Mood: Continue cymbalta for pain as well as mood stabilization. Team to provide ego support and encouragement to help with participation. LCSW to follow for evaluation and support.  5. Neuropsych: This patient is not capable of making decisions on her own behalf.  6. Skin/Wound Care: wound clean 7. Fluids/Electrolytes/Nutrition: Monitor intake for adequate hydration and nutritional status.   -continue IVF 8. HTN: Will monitor every 8 hours. Continue Coreg. Hold Altace given hypotension.  9. CAD: Monitor for symptoms with recommendations to add nitrate if needed. Continue coreg bid, Altace and Lipitor.  10. Recent L-MCA infarct residual mild R HP: on Xarelto.  11. Hypokalemia: resolved past supplementation  12. Leucocytosis: persistent elevation. Temp better this morning  -cultures negative thus far  -continue empiric cipro  -serial cbc 13. Acute blood loss anemia: Hgb trending down again  -stool +OB x2, bright red blood in stool  -hold xarelto (77m currently) for now  -will contact GI for recs/work up  -will request IVCF per IR   LOS (Days) 9 A FACE TO FACE EVALUATION WAS PERFORMED  SWARTZ,ZACHARY T 05/17/2014, 8:17 AM

## 2014-05-17 NOTE — Progress Notes (Addendum)
Pt arousable and able to remain alert once aroused throughout shift. Had incont liquid BM and void at 0130, PVR 230cc, Pt allowed dressing change (gauze only) to incision line on L AKA because it was soiled with stool. Oxy IR 10mg  given at 0150, effective. Only dose of prn pain med needed this shift. Bladder scanned again at 0600 for 345cc, but pt able to void continent 400cc. BP 95/65 HR 90 this morning, afebrile. Pt reports feeling somewhat weak but otherwise OK. Expressing sadness that the "swelling hasn't gone down" (in her stump) and that she will never walk again. Reiterated to pt that she needs to allow [place] to wrap the stump as ordered or use the shrinker. Pt states the pain "is bad enough as it is". Continue to monitor. Hortencia Conradi RN

## 2014-05-17 NOTE — Progress Notes (Signed)
Occupational Therapy Session Note  Patient Details  Name: Traci Mclaughlin MRN: [id number] Date of Birth: Mar 03, 1951  Today's Date: 05/17/2014 OT Individual Time: 1420-1450 OT Individual Time Calculation (min): 30 min    Short Term Goals: Week 2:  OT Short Term Goal 1 (Week 2): STGs = LTGs  Skilled Therapeutic Interventions/Progress Updates:    Engaged in functional mobility and transfers utilizing combination of RW and w/c.  Pt in bed upon arrival, requested to toilet.  Stand pivot bed > w/c with RW then w/c > toilet again with RW. Min/steady assist provided during transfers secondary to decreased balance and hopping/pivoting movements on RLE.  Pt completed hand hygiene seated at sink, then requested to return to bed.  Upon return to bed pt tearful, stating something about her children and her current level.  SWK arrived at end of session, passed off for emotional support and discussion regarding amputee support group.  Therapy Documentation Precautions:  Precautions Precautions: Fall Precaution Comments: LLE AKA-HOH- needs written communication Restrictions Weight Bearing Restrictions: Yes LLE Weight Bearing: Non weight bearing Other Position/Activity Restrictions: wears Darco sandal to unweight L toes Pain:   Pt with c/o pain in Lt limb when seated on toilet, repositioned and no further c/o pain  See FIM for current functional status  Therapy/Group: Individual Therapy  Simonne Come 05/17/2014, 3:20 PM

## 2014-05-17 NOTE — Progress Notes (Signed)
Physical Therapy Weekly Progress Note  Patient Details  Name: Traci Mclaughlin MRN: [id number] Date of Birth: 14-Mar-1951  Beginning of progress report period: May 08, 2014 End of progress report period: May 17, 2014  Today's Date: 05/17/2014 PT Individual Time: 1100-1200 PT Individual Time Calculation (min): 60 min   Patient has met 1 of 9 long term goals.  Short term goals not set due to estimated length of stay.    Patient continues to demonstrate the following deficits: low activity tolerance, difficulty with bed mobility and transfers, guarded and hypersensitive residual limb, swelling in residual limb, weakness in residual limb, poor coordination, difficulty with ambulation, and pain and therefore will continue to benefit from skilled PT intervention to enhance overall performance with activity tolerance, balance, postural control, ability to compensate for deficits and coordination.  See Patient's Care Plan for progression toward long term goals.  Patient not progressing toward long term goals.  See goal revision..  Plan of care revisions: Long term goals downgraded from Mod I goals to min A and Supervision goals.  Skilled Therapeutic Interventions/Progress Updates:    Therapeutic Exercise: PT instructs pt in bed level exercises: L hip flexion and side lie L hip abduction: 3 x 10 reps each. PT begins to instruct pt in residual limb hip extension in side lie, but pt reports she needs to use the bathroom.   Therapeutic Activity: PT instructs pt in supine to sit transfer req min A, stand-pivot transfer with RW from bed to [facility] req min A, stand-pivot transfer BSC to w/c with RW req CGA. Pt is unable to effectively wipe self and so PT provides posterior hygiene with pt in stand with RW.  PT instructs pt to don stump shrinker and pt req max A to do so, but does begin attempting to don it.   Gait Training: PT instructs pt in ambulation with RW x 15' req CGA for safety and  encouragement.   Pt was on medical hold the previous day due to fever, sweating, and poor ability to participate in therapy. Today, pt was doing much better medically, vitals all stable prior to PT session, but she req signficant encouragement to participate in therapy. Goals downgraded due to unrealistic expectations for her to meet mod I goals. Continue per PT POC.    Therapy Documentation Precautions:  Precautions Precautions: Fall Precaution Comments: LLE AKA-HOH- needs written communication Restrictions Weight Bearing Restrictions: Yes LLE Weight Bearing: Non weight bearing Other Position/Activity Restrictions: wears Darco sandal to unweight L toes Vital Signs: Therapy Vitals Pulse Rate: 82 BP: 126/56 mmHg Patient Position (if appropriate): Lying Oxygen Therapy SpO2: 100 % O2 Device: None (Room air) Pain: Pain Assessment Pain Assessment: No/denies pain  See FIM for current functional status  Therapy/Group: Individual Therapy  Maliha Outten M 05/17/2014, 11:26 AM

## 2014-05-17 NOTE — Plan of Care (Signed)
Problem: RH Balance Goal: LTG Patient will maintain dynamic standing with ADLs (OT) LTG: Patient will maintain dynamic standing balance with assist during activities of daily living (OT)  Downgraded to supervision due to slow progress in therapy.

## 2014-05-17 NOTE — Plan of Care (Signed)
Problem: RH Balance Goal: LTG Patient will maintain dynamic standing balance (PT) LTG: Patient will maintain dynamic standing balance with assistance during mobility activities (PT)  Goal downgraded due to lack of progress  Problem: RH Bed Mobility Goal: LTG Patient will perform bed mobility with assist (PT) LTG: Patient will perform bed mobility with assistance, with/without cues (PT).  Goal downgraded due to lack of progress  Problem: RH Bed to Chair Transfers Goal: LTG Patient will perform bed/chair transfers w/assist (PT) LTG: Patient will perform bed/chair transfers with assistance, with/without cues (PT).  Goal downgraded due to lack of progress  Problem: RH Car Transfers Goal: LTG Patient will perform car transfers with assist (PT) LTG: Patient will perform car transfers with assistance (PT).  Goal downgraded due to lack of progress  Problem: RH Furniture Transfers Goal: LTG Patient will perform furniture transfers w/assist (OT/PT LTG: Patient will perform furniture transfers with assistance (OT/PT).  Goal downgraded due to lack of progress  Problem: RH Ambulation Goal: LTG Patient will ambulate in controlled environment (PT) LTG: Patient will ambulate in a controlled environment, # of feet with assistance (PT).  Goal downgraded due to lack of progress Goal: LTG Patient will ambulate in home environment (PT) LTG: Patient will ambulate in home environment, # of feet with assistance (PT).  Goal downgraded due to lack of progress

## 2014-05-17 NOTE — Progress Notes (Signed)
Inpatient RehabilitationTeam Conference and Plan of Care Update Date: 05/15/2014   Time: 2:00 PM    Patient Name: Traci Mclaughlin      Medical Record Number: [id number]  Date of Birth: Oct 05, 1950 Sex: Female         Room/Bed: 4M08C/4M08C-01 Payor Info: Payor: [employer] MEDICARE / Plan: [employer] MEDICARE COMPLETE / Product Type: *No Product type* /    Admitting Diagnosis: l aka deaf  Admit Date/Time:  05/08/2014  5:36 PM Admission Comments: No comment available   Primary Diagnosis:  Unilateral complete AKA Principal Problem: Unilateral complete AKA  Patient Active Problem List   Diagnosis Date Noted  . Unilateral complete AKA 05/08/2014  . CAD (coronary atherosclerotic disease) 05/02/2014  . Preop cardiovascular exam 05/02/2014  . RBBB (right bundle branch block) 05/02/2014  . 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 History of DVT (deep vein thrombosis) 05/02/2014  . Ulcer of left lower leg 04/30/2014  . Leukocytosis 04/26/2014  . Pain in joint, ankle and foot 04/19/2014  . Depression 04/19/2014  . PAD (peripheral artery disease) 04/10/2014  . Left leg pain 03/31/2014  . Elevated troponin 03/11/2014  . 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 Right hemiplegia 03/07/2014  . Dyslipidemia 03/07/2014  . Stroke 03/05/2014  . CKD (chronic kidney disease) stage 3, GFR 30-59 ml/min 03/05/2014  . Hypertension 03/05/2014  . CVA (cerebral infarction) 03/05/2014  . Elevation of cardiac enzymes 07/06/2011  . Cervical cancer 07/06/2011  . Viral gastroenteritis 07/06/2011  . Acute kidney injury 07/06/2011    Expected Discharge Date: Expected Discharge Date: 05/19/14  Team Members Present: Physician leading conference: Dr. Alger Simons Social Worker Present: Alfonse Alpers, LCSW Nurse Present: Elliot Cousin, RN PT Present: Rada Hay, Cecille Rubin, PT OT Present: Chrys Racer, Olena Leatherwood, OT SLP Present: Weston Anna, SLP Other (Discipline  and Name): Danne Baxter, RN [facility]) PPS Coordinator present : Daiva Nakayama, RN, CRRN     Current Status/Progress Goal Weekly Team Focus  Medical   anxiety, pain, hoh. left aka  maximixe functional mobility  pain, anxiety   Bowel/Bladder   Continent of bowel and bladder; LBM 10/21 after sorbitol/miralax; had accidents today due to meds  Remain continent of bowel and bladder with min assist  Up to BSC/toilet to void; monitor for constipation and medicate if appropriate   Swallow/Nutrition/ Hydration   Poor appetite         ADL's   supervision transfers, mod assist LB dressing, setup assist UB dressing and bathing  Mod I overall and supervision bathing   functional transfers, strengthening, activity tolerance, standing balance, education    Mobility   S for transfers, ambulation to 10-15', S for w/c propulsion  S for ambulation up to 50', S for w/c propulsion, mod (I) for bed<>w/c transfers  family training, upright tolerance, pain management, participation   Communication             Safety/Cognition/ Behavioral Observations  Refusing therapy/ shrinker; memory deficits- requires repeat education  Mod I      Pain   Left stump pain- controlled with po pain meds; po meds making patient lethargic  < 3  Assess and treat for pain q shift and prn; monitor for adverse effects   Skin   Incision to left stump CDI with staples; refusing shrinker or ace wrap; no skin breakdown noted  Remain free from breakdown or infection with min assist  Assess skin q shift and prn    Rehab Goals Patient on  target to meet rehab goals: No Rehab Goals Revised: Pt has been refusing therapies, so goals will need to be downgraded. *See Care Plan and progress notes for long and short-term goals.  Barriers to Discharge: anxiety, hearing    Possible Resolutions to Barriers:  redirection, hearing at home, situational anxiety mgt, supervision at home    Discharge Planning/Teaching Needs:  Pt plans to return home  with her dtr, Traci Mclaughlin, who will provide supervision.  Family needs to come in for family education, especially to learn how to bump w/c up 2 stairs until ramp can be installed.   Team Discussion:  Pt is highly anxious and needs a lot of reaffirmation.  Her hearing impairment makes communication difficult with providers.  Pt is not meeting her goals and she has refused multiple therapy sessions.  Pt's family needs to come in for family education, per PT, and she is not going to meet her 93' ambulation goal.    Revisions to Treatment Plan:  Pt's goals will need to be downgraded.   Continued Need for Acute Rehabilitation Level of Care: The patient requires daily medical management by a physician with specialized training in physical medicine and rehabilitation for the following conditions: Daily direction of a multidisciplinary physical rehabilitation program to ensure safe treatment while eliciting the highest outcome that is of practical value to the patient.: Yes Daily medical management of patient stability for increased activity during participation in an intensive rehabilitation regime.: Yes Daily analysis of laboratory values and/or radiology reports with any subsequent need for medication adjustment of medical intervention for : Post surgical problems;Other  Traci Mclaughlin, Traci Mclaughlin 05/17/2014, 10:50 PM

## 2014-05-17 NOTE — Progress Notes (Signed)
Occupational Therapy Session Note  Patient Details  Name: Traci Mclaughlin MRN: [id number] Date of Birth: [birth year]  Today's Date: 05/17/2014 OT Individual Time: 1000-1035 and 1300-1400 OT Individual Time Calculation (min): 35 min and 40 min   Missed min: 15 (refusal and fatigue)   Short Term Goals: Week 2:  OT Short Term Goal 1 (Week 2): STGs = LTGs  Skilled Therapeutic Interventions/Progress Updates:    Session 1: Pt seen for 1:1 OT session with focus on bed mobility, activity tolerance, and overall participation. Pt received supine in bed crying. Pt required max cues of encouragement for participation. Provided education on importance of participation to increase independence in self-care tasks. Pt willing to sit EOB and participate in card game. Pt required increased time and min assist for supine>sit with HOB elevated. Pt sat EOB unsupported during therapeutic activity. Pt began crying and requesting to return to supine. Pt required supervision for supine>sit and assist for positioning in supine. Pt left with all needs in reach. Notified RN of missed therapy min d/t fatigue and refusal.  Session 2: Pt seen for 1:1 OT session with focus on functional transfers, activity tolerance, and initiation. Pt received supine in bed requiring max cues of encouragement. Provided education on goals of therapy. Pt requesting to complete oral care. Completed stand pivot transfer bed>w/c with RW at supervision level. Engaged in grooming tasks at sink with setup assist to retrieve items. Pt completed from w/c level as she declined standing for tasks. Completed sit<>stand from w/c 3x with min cues for hand placement. Pt requesting to use BSC. Transferred to [facility] at supervision level. Pt left sitting on BSC with NT present.    Therapy Documentation Precautions:  Precautions Precautions: Fall Precaution Comments: LLE AKA-HOH- needs written communication Restrictions Weight Bearing Restrictions: Yes LLE  Weight Bearing: Non weight bearing Other Position/Activity Restrictions: wears Darco sandal to unweight L toes General: General OT Amount of Missed Time: 25 Minutes Vital Signs: Therapy Vitals Pulse Rate: 95 BP: 140/80 mmHg Patient Position (if appropriate): Lying Oxygen Therapy SpO2: 97 % O2 Device: None (Room air) Pain: No report of pain during therapy sessions.   See FIM for current functional status  Therapy/Group: Individual Therapy  Duayne Cal 05/17/2014, 10:43 AM

## 2014-05-17 NOTE — Progress Notes (Signed)
INITIAL NUTRITION ASSESSMENT  Pt meets criteria for SEVERE MALNUTRITION in the context of acute illness or injury as evidenced by a 5% weight loss in 10 days and energy intake </= 50% for >/= 5 days.  DOCUMENTATION CODES Per approved criteria  -Severe malnutrition in the context of acute illness or injury   INTERVENTION: Pt currently NPO. Once pt is able to eat: Continue Ensure Complete po TID, each supplement provides 350 kcal and 13 grams of protein.  Encourage PO intake.  NUTRITION DIAGNOSIS: Inadequate oral intake related to decreased appetite as evidenced by meal completion of 15-50%.   Goal: Pt to meet >/= 90% of their estimated nutrition needs   Monitor:  PO intake, weight trends, labs, I/O's  Reason for Assessment: MD consult  63 y.o. 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 She developed left toe ulcers as well as left knee wound with infection and worsening of pain. Pt underwent L-AKA on 05/04/14.   Meal completion since admission to rehab is 15-50%. Pt reports she has a decreased appetite, which has been present over the past 2 months. Pt reports prior to admission, she has been eating fine with at least 2 full meals a day. Pt reports she has lost weight with her usual body weight of 200 lbs. Pt with a 18.5% weight loss in 3 months. Ensure complete has been ordered. Pt reports she would not like additional supplements or snacks as Ensure is fine for her. Pt was encouraged to eat her food at meals once diet is advances.   Pt with no observed significant fat or muscle mass loss.   Height: Ht Readings from Last 1 Encounters:  04/30/14 5\' 3"  (1.6 m)    Weight: Wt Readings from Last 1 Encounters:  05/16/14 163 lb 5.8 oz (74.1 kg)    Ideal Body Weight: Adjusted body weight: 106 lbs.    % Ideal Body Weight: 142%  Wt Readings from Last 10 Encounters:  05/16/14 163 lb 5.8 oz (74.1 kg)  05/06/14 172 lb 13.5 oz (78.4 kg)  05/06/14 172 lb 13.5 oz (78.4 kg)  05/06/14 172 lb 13.5 oz (78.4 kg)  04/30/14 180 lb (81.647 kg)  04/26/14 180 lb 6.4 oz (81.829 kg)  04/23/14 185 lb (83.915 kg)  04/19/14 184 lb 9.6 oz (83.734 kg)  04/10/14 186 lb (84.369 kg)  04/04/14 190 lb (86.183 kg)   Usual Body Weight: 200 lbs  % Usual Body Weight: 82%  BMI:  Body mass index is 28.95 kg/(m^2).  Estimated Nutritional Needs: Kcal: 2000-2200  Protein: 90-100 grams Fluid: 2 - 2.2 L/day  Skin: incision left thigh, non-pitting RLE edema  Diet Order: NPO  EDUCATION NEEDS: -No education needs identified at this time   Intake/Output Summary (Last 24 hours) at 05/17/14 0936 Last data filed at 05/17/14 0800  Gross per 24 hour  Intake    590 ml  Output    550 ml  Net     40 ml    Last BM: 10/21  Labs:   Recent Labs Lab 05/16/14 1415  NA 138  K 4.5  CL 100  CO2 25  BUN 15  CREATININE 1.41*  CALCIUM 9.5  GLUCOSE 137*    CBG (last 3)  No results found for this basename: GLUCAP,  in the last 72 hours  Scheduled Meds: .  ALPRAZolam  0.25 mg Oral BID  . atorvastatin  40 mg Oral q1800  . carvedilol  18.75 mg Oral BID WC  . ciprofloxacin  500 mg Oral BID  . DULoxetine  40 mg Oral Daily  . feeding supplement (ENSURE COMPLETE)  237 mL Oral TID WC  . Influenza vac split quadrivalent PF  0.5 mL Intramuscular Tomorrow-1000  . Influenza vac split quadrivalent PF  0.5 mL Intramuscular Once  . pantoprazole  40 mg Oral BID AC  . polyethylene glycol  17 g Oral BID  . potassium chloride  10 mEq Oral BID    Continuous Infusions: . sodium chloride 100 mL/hr at 05/16/14 1738    Past Medical History  Diagnosis Date  . Hypertension   . HOH (hard of hearing)   . CVA (cerebral infarction)   . PAD (peripheral artery disease) 04/10/2014  . Cancer     cervical; was on chemo; has  been in remission for 29yrs  . Depression 04/19/2014  . Hx of cardiovascular stress test     Nuclear (9/15):  Inf ischemia, EF 35%; high risk  . Stroke Aug. 10, 2015  . STEMI (ST elevation myocardial infarction) 06/2011  . RBBB (right bundle branch block) 05/02/2014  . Chronic kidney disease     daughter says no,they checked them,okay    Past Surgical History  Procedure Laterality Date  . Cardiac catheterization  07/03/2011    Dr. Irish Lack  . Tee without cardioversion N/A 03/08/2014    Procedure: TRANSESOPHAGEAL ECHOCARDIOGRAM (TEE);  Surgeon: Fay Records, MD;  Location: [facility] ENDOSCOPY;  Service: Cardiovascular;  Laterality: N/A;  . 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 Amputation Left 05/04/2014    Procedure: AMPUTATION ABOVE KNEE;  Surgeon: Angelia Mould, MD;  Location: [facility];  Service: Vascular;  Laterality: Left;    Kallie Locks, MS, RD, LDN Pager # [phone] After hours/ weekend pager # [phone]

## 2014-05-18 ENCOUNTER — Inpatient Hospital Stay (HOSPITAL_COMMUNITY): Payer: Medicare Other | Admitting: Physical Therapy

## 2014-05-18 ENCOUNTER — Encounter (HOSPITAL_COMMUNITY): Payer: Medicare Other

## 2014-05-18 ENCOUNTER — Inpatient Hospital Stay (HOSPITAL_COMMUNITY): Payer: Medicare Other

## 2014-05-18 DIAGNOSIS — I82402 Acute embolism and thrombosis of unspecified deep veins of left lower extremity: Secondary | ICD-10-CM

## 2014-05-18 LAB — CBC WITH DIFFERENTIAL/PLATELET
BASOS PCT: 0 % (ref 0–1)
Basophils Absolute: 0 10*3/uL (ref 0.0–0.1)
EOS ABS: 0.3 10*3/uL (ref 0.0–0.7)
EOS PCT: 2 % (ref 0–5)
HCT: 22.5 % — ABNORMAL LOW (ref 36.0–46.0)
HEMOGLOBIN: 7.6 g/dL — AB (ref 12.0–15.0)
LYMPHS PCT: 11 % — AB (ref 12–46)
Lymphs Abs: 1.5 10*3/uL (ref 0.7–4.0)
MCH: 33 pg (ref 26.0–34.0)
MCHC: 33.8 g/dL (ref 30.0–36.0)
MCV: 97.8 fL (ref 78.0–100.0)
MONOS PCT: 8 % (ref 3–12)
Monocytes Absolute: 1.1 10*3/uL — ABNORMAL HIGH (ref 0.1–1.0)
NEUTROS PCT: 79 % — AB (ref 43–77)
Neutro Abs: 10.4 10*3/uL — ABNORMAL HIGH (ref 1.7–7.7)
Platelets: 360 10*3/uL (ref 150–400)
RBC: 2.3 MIL/uL — AB (ref 3.87–5.11)
RDW: 13.6 % (ref 11.5–15.5)
WBC: 13.3 10*3/uL — AB (ref 4.0–10.5)

## 2014-05-18 LAB — OCCULT BLOOD X 1 CARD TO LAB, STOOL: Fecal Occult Bld: NEGATIVE

## 2014-05-18 MED ORDER — MIDAZOLAM HCL 2 MG/2ML IJ SOLN
INTRAMUSCULAR | Status: AC
  Filled 2014-05-18: qty 2

## 2014-05-18 MED ORDER — MIDAZOLAM HCL 2 MG/2ML IJ SOLN
INTRAMUSCULAR | Status: AC | PRN
  Administered 2014-05-18: 0.5 mg via INTRAVENOUS
  Administered 2014-05-18: 1 mg via INTRAVENOUS

## 2014-05-18 MED ORDER — FENTANYL CITRATE 0.05 MG/ML IJ SOLN
INTRAMUSCULAR | Status: AC | PRN
  Administered 2014-05-18: 50 ug via INTRAVENOUS
  Administered 2014-05-18: 25 ug via INTRAVENOUS

## 2014-05-18 MED ORDER — SODIUM CHLORIDE 0.9 % IV SOLN
INTRAVENOUS | Status: AC | PRN
Start: 2014-05-18 — End: 2014-05-18
  Administered 2014-05-18: 10 mL/h via INTRAVENOUS

## 2014-05-18 MED ORDER — LIDOCAINE HCL 1 % IJ SOLN
INTRAMUSCULAR | Status: AC
  Filled 2014-05-18: qty 20

## 2014-05-18 MED ORDER — IOHEXOL 300 MG/ML  SOLN
100.0000 mL | Freq: Once | INTRAMUSCULAR | Status: AC | PRN
  Administered 2014-05-18: 40 mL via INTRAVENOUS

## 2014-05-18 MED ORDER — FENTANYL CITRATE 0.05 MG/ML IJ SOLN
INTRAMUSCULAR | Status: AC
  Filled 2014-05-18: qty 2

## 2014-05-18 NOTE — Progress Notes (Signed)
[facility] Progress Note  Subjective: No stools since yesterday's consult note. Nurse tells me initial stools after sorbitol never looked bloody or melenic but were occult heme positive  Objective: Vital signs in last 24 hours: Temp:  [98.5 F (36.9 C)-99.6 F (37.6 C)] 98.6 F (37 C) (10/23 3300) Pulse Rate:  [82-95] 94 (10/23 0648) Resp:  [18] 18 (10/23 0648) BP: (116-146)/(46-80) 146/71 mmHg (10/23 0648) SpO2:  [97 %-100 %] 100 % (10/23 0648) Weight change:    TM:[id number]  Lab Results: Results for orders placed during the hospital encounter of 05/08/14 (from the past 24 hour(s))  PROTIME-INR     Status: Abnormal   Collection Time    05/17/14 10:28 AM      Result Value Ref Range   Prothrombin Time 29.1 (*) 11.6 - 15.2 seconds   INR 2.72 (*) 0.00 - 6.25  BASIC METABOLIC PANEL     Status: Abnormal   Collection Time    05/17/14 10:28 AM      Result Value Ref Range   Sodium 140  137 - 147 mEq/L   Potassium 4.5  3.7 - 5.3 mEq/L   Chloride 103  96 - 112 mEq/L   CO2 24  19 - 32 mEq/L   Glucose, Bld 119 (*) 70 - 99 mg/dL   BUN 11  6 - 23 mg/dL   Creatinine, Ser 1.12 (*) 0.50 - 1.10 mg/dL   Calcium 9.0  8.4 - 10.5 mg/dL   GFR calc non Af Amer 51 (*) >90 mL/min   GFR calc Af Amer 59 (*) >90 mL/min   Anion gap 13  5 - 15  OCCULT BLOOD X 1 CARD TO LAB, STOOL     Status: Abnormal   Collection Time    05/17/14  5:15 PM      Result Value Ref Range   Fecal Occult Bld POSITIVE (*) NEGATIVE  CBC WITH DIFFERENTIAL     Status: Abnormal   Collection Time    05/18/14 12:00 AM      Result Value Ref Range   WBC 13.3 (*) 4.0 - 10.5 K/uL   RBC 2.30 (*) 3.87 - 5.11 MIL/uL   Hemoglobin 7.6 (*) 12.0 - 15.0 g/dL   HCT 22.5 (*) 36.0 - 46.0 %   MCV 97.8  78.0 - 100.0 fL   MCH 33.0  26.0 - 34.0 pg   MCHC 33.8  30.0 - 36.0 g/dL   RDW 13.6  11.5 - 15.5 %   Platelets 360  150 - 400 K/uL   Neutrophils Relative % 79 (*) 43 - 77 %   Lymphocytes Relative 11 (*) 12 - 46 %    Monocytes Relative 8  3 - 12 %   Eosinophils Relative 2  0 - 5 %   Basophils Relative 0  0 - 1 %   Neutro Abs 10.4 (*) 1.7 - 7.7 K/uL   Lymphs Abs 1.5  0.7 - 4.0 K/uL   Monocytes Absolute 1.1 (*) 0.1 - 1.0 K/uL   Eosinophils Absolute 0.3  0.0 - 0.7 K/uL   Basophils Absolute 0.0  0.0 - 0.1 K/uL   Smear Review MORPHOLOGY UNREMARKABLE      Studies/Results: Dg Chest Port 1 View  05/16/2014   CLINICAL DATA:  Fever.  EXAM: PORTABLE CHEST - 1 VIEW  COMPARISON:  03/05/2014  FINDINGS: There is no evidence of pulmonary edema, consolidation, pneumothorax, nodule or pleural fluid. The heart size is normal. There has been interval placement of a  cardiac loop recorder device in the left chest wall. Stable mild tortuosity of the thoracic aortic  IMPRESSION: No active disease.  Interval placement of loop recorder device.   Electronically Signed   By: Aletta Edouard M.D.   On: 05/16/2014 13:47   Dg Abd Portable 1v  05/16/2014   CLINICAL DATA:  Constipation.  EXAM: PORTABLE ABDOMEN - 1 VIEW  COMPARISON:  None.  FINDINGS: The bowel gas pattern is normal. Phleboliths are noted in the pelvis. No significant stool burden is noted. Degenerative changes are noted in the lower lumbar spine.  IMPRESSION: No significant stool burden is seen. There is no evidence of bowel obstruction or ileus.   Electronically Signed   By: Sabino Dick M.D.   On: 05/16/2014 13:34      Assessment: Anemia, heme pos stool , on Xarelto, now held. BRB or melena according to nurse but still has elevated INR and some fall in hb today BKA complicated by DVT sp [place] filter today   Plan: Will cont to monitor stools and hb. Needs colon at some point, understand desire to restart plavix based on cerebrovascular disease if possible.Communication difficult dt deafness. No family here . Will keep on clear liquid diet, possibly plan colon on Mon or earlier depending on course, preferably with lower INR. Will keep on clear liquids for  now    Mildred Bollard C 05/18/2014, 7:14 AM

## 2014-05-18 NOTE — Progress Notes (Signed)
Subjective/Complaints:   More alert this am. Was lethargic yesterday with temp. States she's feeling better this morning. Review of Systems - limited by hearing loss and aphasia   Objective: Vital Signs: Blood pressure 146/71, pulse 94, temperature 98.6 F (37 C), temperature source Oral, resp. rate 18, weight 74.1 kg (163 lb 5.8 oz), SpO2 100.00%. Dg Chest Port 1 View  05/16/2014   CLINICAL DATA:  Fever.  EXAM: PORTABLE CHEST - 1 VIEW  COMPARISON:  03/05/2014  FINDINGS: There is no evidence of pulmonary edema, consolidation, pneumothorax, nodule or pleural fluid. The heart size is normal. There has been interval placement of a cardiac loop recorder device in the left chest wall. Stable mild tortuosity of the thoracic aortic  IMPRESSION: No active disease.  Interval placement of loop recorder device.   Electronically Signed   By: Aletta Edouard M.D.   On: 05/16/2014 13:47   Dg Abd Portable 1v  05/16/2014   CLINICAL DATA:  Constipation.  EXAM: PORTABLE ABDOMEN - 1 VIEW  COMPARISON:  None.  FINDINGS: The bowel gas pattern is normal. Phleboliths are noted in the pelvis. No significant stool burden is noted. Degenerative changes are noted in the lower lumbar spine.  IMPRESSION: No significant stool burden is seen. There is no evidence of bowel obstruction or ileus.   Electronically Signed   By: Sabino Dick M.D.   On: 05/16/2014 13:34   Results for orders placed during the hospital encounter of 05/08/14 (from the past 72 hour(s))  OCCULT BLOOD X 1 CARD TO LAB, STOOL     Status: Abnormal   Collection Time    05/16/14 12:52 PM      Result Value Ref Range   Fecal Occult Bld POSITIVE (*) NEGATIVE  OCCULT BLOOD X 1 CARD TO LAB, STOOL     Status: Abnormal   Collection Time    05/16/14  2:13 PM      Result Value Ref Range   Fecal Occult Bld POSITIVE (*) NEGATIVE  CBC WITH DIFFERENTIAL     Status: Abnormal   Collection Time    05/16/14  2:15 PM      Result Value Ref Range   WBC 16.1 (*) 4.0  - 10.5 K/uL   RBC 2.76 (*) 3.87 - 5.11 MIL/uL   Hemoglobin 8.7 (*) 12.0 - 15.0 g/dL   HCT 26.8 (*) 36.0 - 46.0 %   MCV 97.1  78.0 - 100.0 fL   MCH 31.5  26.0 - 34.0 pg   MCHC 32.5  30.0 - 36.0 g/dL   RDW 13.6  11.5 - 15.5 %   Platelets 426 (*) 150 - 400 K/uL   Neutrophils Relative % 80 (*) 43 - 77 %   Neutro Abs 13.0 (*) 1.7 - 7.7 K/uL   Lymphocytes Relative 10 (*) 12 - 46 %   Lymphs Abs 1.5  0.7 - 4.0 K/uL   Monocytes Relative 9  3 - 12 %   Monocytes Absolute 1.5 (*) 0.1 - 1.0 K/uL   Eosinophils Relative 1  0 - 5 %   Eosinophils Absolute 0.1  0.0 - 0.7 K/uL   Basophils Relative 0  0 - 1 %   Basophils Absolute 0.0  0.0 - 0.1 K/uL  BASIC METABOLIC PANEL     Status: Abnormal   Collection Time    05/16/14  2:15 PM      Result Value Ref Range   Sodium 138  137 - 147 mEq/L   Potassium 4.5  3.7 -  5.3 mEq/L   Chloride 100  96 - 112 mEq/L   CO2 25  19 - 32 mEq/L   Glucose, Bld 137 (*) 70 - 99 mg/dL   BUN 15  6 - 23 mg/dL   Creatinine, Ser 1.41 (*) 0.50 - 1.10 mg/dL   Calcium 9.5  8.4 - 10.5 mg/dL   GFR calc non Af Amer 39 (*) >90 mL/min   GFR calc Af Amer 45 (*) >90 mL/min   Comment: (NOTE)     The eGFR has been calculated using the CKD EPI equation.     This calculation has not been validated in all clinical situations.     eGFR's persistently <90 mL/min signify possible Chronic Kidney     Disease.   Anion gap 13  5 - 15  URINALYSIS, ROUTINE W REFLEX MICROSCOPIC     Status: Abnormal   Collection Time    05/16/14  5:41 PM      Result Value Ref Range   Color, Urine AMBER (*) YELLOW   Comment: BIOCHEMICALS MAY BE AFFECTED BY COLOR   APPearance CLEAR  CLEAR   Specific Gravity, Urine 1.022  1.005 - 1.030   pH 5.5  5.0 - 8.0   Glucose, UA NEGATIVE  NEGATIVE mg/dL   Hgb urine dipstick NEGATIVE  NEGATIVE   Bilirubin Urine SMALL (*) NEGATIVE   Ketones, ur NEGATIVE  NEGATIVE mg/dL   Protein, ur NEGATIVE  NEGATIVE mg/dL   Urobilinogen, UA 0.2  0.0 - 1.0 mg/dL   Nitrite NEGATIVE   NEGATIVE   Leukocytes, UA NEGATIVE  NEGATIVE   Comment: MICROSCOPIC NOT DONE ON URINES WITH NEGATIVE PROTEIN, BLOOD, LEUKOCYTES, NITRITE, OR GLUCOSE <1000 mg/dL.  URINE CULTURE     Status: None   Collection Time    05/16/14  5:42 PM      Result Value Ref Range   Specimen Description URINE, CATHETERIZED     Special Requests NONE     Culture  Setup Time       Value: 05/16/2014 22:24     Performed at [facility]       Value: NO GROWTH     Performed at [facility]   Culture       Value: NO GROWTH     Performed at [facility]   Report Status 05/17/2014 FINAL    CBC     Status: Abnormal   Collection Time    05/17/14  4:24 AM      Result Value Ref Range   WBC 15.9 (*) 4.0 - 10.5 K/uL   RBC 2.61 (*) 3.87 - 5.11 MIL/uL   Hemoglobin 8.2 (*) 12.0 - 15.0 g/dL   HCT 25.4 (*) 36.0 - 46.0 %   MCV 97.3  78.0 - 100.0 fL   MCH 31.4  26.0 - 34.0 pg   MCHC 32.3  30.0 - 36.0 g/dL   RDW 13.7  11.5 - 15.5 %   Platelets 387  150 - 400 K/uL  PROTIME-INR     Status: Abnormal   Collection Time    05/17/14 10:28 AM      Result Value Ref Range   Prothrombin Time 29.1 (*) 11.6 - 15.2 seconds   INR 2.72 (*) 0.00 - 6.21  BASIC METABOLIC PANEL     Status: Abnormal   Collection Time    05/17/14 10:28 AM      Result Value Ref Range   Sodium 140  137 - 147  mEq/L   Potassium 4.5  3.7 - 5.3 mEq/L   Chloride 103  96 - 112 mEq/L   CO2 24  19 - 32 mEq/L   Glucose, Bld 119 (*) 70 - 99 mg/dL   BUN 11  6 - 23 mg/dL   Creatinine, Ser 1.12 (*) 0.50 - 1.10 mg/dL   Calcium 9.0  8.4 - 10.5 mg/dL   GFR calc non Af Amer 51 (*) >90 mL/min   GFR calc Af Amer 59 (*) >90 mL/min   Comment: (NOTE)     The eGFR has been calculated using the CKD EPI equation.     This calculation has not been validated in all clinical situations.     eGFR's persistently <90 mL/min signify possible Chronic Kidney     Disease.   Anion gap 13  5 - 15  OCCULT BLOOD X 1 CARD TO LAB, STOOL      Status: Abnormal   Collection Time    05/17/14  5:15 PM      Result Value Ref Range   Fecal Occult Bld POSITIVE (*) NEGATIVE  CBC WITH DIFFERENTIAL     Status: Abnormal   Collection Time    05/18/14 12:00 AM      Result Value Ref Range   WBC 13.3 (*) 4.0 - 10.5 K/uL   Comment: [facility] COUNT CONFIRMED ON SMEAR   RBC 2.30 (*) 3.87 - 5.11 MIL/uL   Hemoglobin 7.6 (*) 12.0 - 15.0 g/dL   HCT 22.5 (*) 36.0 - 46.0 %   MCV 97.8  78.0 - 100.0 fL   MCH 33.0  26.0 - 34.0 pg   MCHC 33.8  30.0 - 36.0 g/dL   RDW 13.6  11.5 - 15.5 %   Platelets 360  150 - 400 K/uL   Neutrophils Relative % 79 (*) 43 - 77 %   Lymphocytes Relative 11 (*) 12 - 46 %   Monocytes Relative 8  3 - 12 %   Eosinophils Relative 2  0 - 5 %   Basophils Relative 0  0 - 1 %   Neutro Abs 10.4 (*) 1.7 - 7.7 K/uL   Lymphs Abs 1.5  0.7 - 4.0 K/uL   Monocytes Absolute 1.1 (*) 0.1 - 1.0 K/uL   Eosinophils Absolute 0.3  0.0 - 0.7 K/uL   Basophils Absolute 0.0  0.0 - 0.1 K/uL   Smear Review MORPHOLOGY UNREMARKABLE        HEENT: poor dentition Cardio: RRR and no murmur Resp: CTA B/L and unlabored GI: BS positive and NT, ND Extremity:  Pulses positive and No Edema. Right foot Skin:   Intact and Wound C/D/I also. Neuro: Flat, Cranial Nerve II-XII normal, Abnormal Motor 4/5 in RUE, 3- R HF, 4/5 R KE, ADF, 5/5 LUE, 3/5 L HF, Abnormal FMC Ataxic/ dec FMC and Dysarthric Musc/Skel:  Swelling Left thigh no erythema. Gen NAD Psych: calm this am   Assessment/Plan: 1. 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 Physiatrist is providing close team supervision and 24 hour management of active medical problems listed below. Physiatrist and rehab team continue to assess barriers to discharge/monitor patient progress toward functional and medical goals. FIM: FIM - Bathing Bathing Steps Patient Completed: Chest;Right Arm;Left  Arm;Front perineal area;Right upper leg;Left upper leg;Abdomen;Right lower leg (including foot) Bathing: 4: Min-Patient completes 8-9 50f10 parts or 75+ percent  FIM - Upper Body Dressing/Undressing  Upper body dressing/undressing steps patient completed: Thread/unthread right bra strap;Thread/unthread left bra strap;Hook/unhook bra;Thread/unthread right sleeve of pullover shirt/dresss;Thread/unthread left sleeve of pullover shirt/dress;Put head through opening of pull over shirt/dress;Pull shirt over trunk Upper body dressing/undressing: 5: Set-up assist to: Obtain clothing/put away FIM - Lower Body Dressing/Undressing Lower body dressing/undressing steps patient completed: Thread/unthread right pants leg;Thread/unthread left pants leg Lower body dressing/undressing: 3: Mod-Patient completed 50-74% of tasks  FIM - Toileting Toileting steps completed by patient: Performs perineal hygiene Toileting Assistive Devices: Grab bar or rail for support Toileting: 3: Mod-Patient completed 2 of 3 steps  FIM - Radio producer Devices: Environmental consultant;Bedside commode Toilet Transfers: 5-To toilet/BSC: Supervision (verbal cues/safety issues);5-From toilet/BSC: Supervision (verbal cues/safety issues)  FIM - Control and instrumentation engineer Devices: Walker;Arm rests Bed/Chair Transfer: 5: Supine > Sit: Supervision (verbal cues/safety issues);5: Bed > Chair or W/C: Supervision (verbal cues/safety issues)  FIM - Locomotion: Wheelchair Distance: 200 Locomotion: Wheelchair: 0: Activity did not occur FIM - Locomotion: Ambulation Locomotion: Ambulation Assistive Devices: Administrator Ambulation/Gait Assistance: 4: Min guard Locomotion: Ambulation: 1: Travels less than 50 ft with minimal assistance (Pt.>75%)  Comprehension Comprehension Mode: Visual Comprehension: 5-Understands basic 90% of the time/requires cueing < 10% of the time  Expression Expression Mode:  Verbal Expression Assistive Devices: 6-Communication board Expression: 5-Expresses basic 90% of the time/requires cueing < 10% of the time.  Social Interaction Social Interaction Mode: Asleep Social Interaction: 4-Interacts appropriately 75 - 89% of the time - Needs redirection for appropriate language or to initiate interaction.  Problem Solving Problem Solving: 4-Solves basic 75 - 89% of the time/requires cueing 10 - 24% of the time  Memory Memory: 5-Recognizes or recalls 90% of the time/requires cueing < 10% of the time  Medical Problem List and Plan:  1. 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  2. LLE DVT/Anticoagulation: Pharmaceutical: Xarelto (decreased)  -will place IVCF and ultimately stop xarelto once placed---resume asa/plavix? 3. Pain Management:  -oxycontin stopped due to lethargy  -continue oxycodone prn. Due to hearing impairment adn aphasia , has difficulty communicating need for prn meds -anxiety is a major issue 4. Mood: Continue cymbalta for pain as well as mood stabilization. Team to provide ego support and encouragement to help with participation. LCSW to follow for evaluation and support.  5. Neuropsych: This patient is not capable of making decisions on her own behalf.  6. Skin/Wound Care: wound clean 7. Fluids/Electrolytes/Nutrition: Monitor intake for adequate hydration and nutritional status.   -hold IVF for now 8. HTN: Will monitor every 8 hours. Continue Coreg. Hold Altace given hypotension.  9. CAD: Monitor for symptoms with recommendations to add nitrate if needed. Continue coreg bid, Altace and Lipitor.  10. Recent L-MCA infarct residual mild R HP: on Xarelto.  11. Hypokalemia: resolved past supplementation  12. Leucocytosis: decreased to 13k today. Temp better   -cultures negative r  -dc empiric cipro    13. Acute blood loss anemia:    -stool +OB x2, bright red blood in stool  -holding xarelto (49m currently) for now  -appreciate GI  assist and agree with conservative approach  - IVCF per IR  -hgb down to 7.6 today (may be dilutional component)--continue to follow serially, may need to transfuse soon   LOS (Days) 10 A FACE TO FACE EVALUATION WAS PERFORMED  SWARTZ,ZACHARY T 05/18/2014, 8:30 AM

## 2014-05-18 NOTE — Progress Notes (Signed)
Spoke with Marlowe Shores, PA regarding resuming therapies today after IVC filter placement.  He said to continue on with therapies for now, just nothing too strenuous.  Will continue to monitor.  Brita Romp, RN

## 2014-05-18 NOTE — Plan of Care (Signed)
Problem: RH SKIN INTEGRITY Goal: RH STG MAINTAIN SKIN INTEGRITY WITH ASSISTANCE STG Maintain Skin Integrity With min Assistance.  Outcome: Not Progressing Stage II sacrum; refuses prevalon boot  Problem: RH SAFETY Goal: RH STG ADHERE TO SAFETY PRECAUTIONS W/ASSISTANCE/DEVICE STG Adhere to Safety Precautions With Supervision Assistance/Device.  Outcome: Not Progressing Patient fell this morning trying to get herself up from bed Goal: RH STG DECREASED RISK OF FALL WITH ASSISTANCE STG Decreased Risk of Fall With min Assistance.  Outcome: Not Progressing Fall today

## 2014-05-18 NOTE — Progress Notes (Signed)
Physical Therapy Note  Patient Details  Name: Traci Mclaughlin MRN: [id number] Date of Birth: 1951/01/26 Today's Date: 05/18/2014   Pt missed 60 minutes of AM therapy due to being taken to interventional radiology for IVC filter placement. MD hold for therapy until 1pm for this day.    [facility] M 05/18/2014, 12:51 PM

## 2014-05-18 NOTE — Plan of Care (Signed)
Problem: RH SKIN INTEGRITY Goal: RH STG SKIN FREE OF INFECTION/BREAKDOWN w min assist  Outcome: Not Progressing Stage II sacrum

## 2014-05-18 NOTE — Progress Notes (Signed)
Physical Therapy Session Note  Patient Details  Name: Traci Mclaughlin MRN: [id number] Date of Birth: 06-May-1951  Today's Date: 05/18/2014 PT Individual Time: 1400-1500 PT Individual Time Calculation (min): 60 min   Short Term Goals: Week 2:  PT Short Term Goal 1 (Week 2): =LTGs due to ELOS  Skilled Therapeutic Interventions/Progress Updates:    Therapeutic Exercise: PT instructs pt in bed level ROM and strengthening exercises: L hip flexion, L hip abduction in side lie, L hip extension in side lie: 3 x 10 reps. Pt moves extremely slowly.   Therapeutic Activity: PT instructs pt in scooting to her L in supine using B rails and PT stabilizes pt's R foot while leg is in hook lie and provides min A to help pt scoot.  PT instructs pt in rolling onto her R side with bedrail req SBA and significantly increased time.  PT instructs pt in R side lie to sit edge of bed with rail req min A. Pt tolerates sitting edge of bed x 5 minutes.  PT instructs pt in sit to supine transfer with bedrail req SBA.   Gentle PT done this PM, per MD orders. Pt slightly deconditioned from recent infection (fever and sweating) and IVC filter placed. Family training in bumping pt up/down stairs to enter/exit home still needs to occur. Continue per PT POC.   Therapy Documentation Precautions:  Precautions Precautions: Fall Precaution Comments: LLE AKA-HOH- needs written communication Restrictions Weight Bearing Restrictions: Yes LLE Weight Bearing: Non weight bearing (to stump) Other Position/Activity Restrictions: wears Darco sandal to unweight L toes General: PT Amount of Missed Time (min): 60 Minutes PT Missed Treatment Reason: MD hold (Comment) (IVC filter placed during therapy time in AM) Pain: Pain Assessment Pain Assessment: No/denies pain Pain Score: Asleep Faces Pain Scale: Hurts little more Pain Type: Acute pain Pain Location: Leg Pain Orientation: Left Pain Descriptors / Indicators:  Crying Pain Frequency: Intermittent Pain Onset: On-going Patients Stated Pain Goal: 3 Pain Intervention(s): Medication (See eMAR)  See FIM for current functional status  Therapy/Group: Individual Therapy  [name] 05/18/2014, 2:14 PM

## 2014-05-18 NOTE — Progress Notes (Signed)
Occupational Therapy Note  Patient Details  Name: Traci Mclaughlin MRN: [id number] Date of Birth: [birth year]  Today's Date: 05/18/2014 OT Missed Time: 41 Minutes Missed Time Reason: MD hold (comment)  Patient missing 60 min skilled OT secondary to MD hold until PM due to IVC filter placement. Will follow-up as able.  Doreena Maulden N 05/18/2014, 10:11 AM

## 2014-05-18 NOTE — Sedation Documentation (Signed)
Patient denies pain and is resting comfortably.  

## 2014-05-18 NOTE — Progress Notes (Signed)
05/18/14 0630  What Happened  Was fall witnessed? No  Was patient injured? Yes  Patient found on floor  Found by Staff-comment [facility])  Stated prior activity ambulating-unassisted  Follow Up  MD notified Silvestre Mesi  Time MD notified [phone]  Additional tests No  Adult Fall Risk Assessment  Risk Factor Category (scoring not indicated) High fall risk per protocol (document High fall risk)  Adult Fall Risk Interventions  Required Bundle Interventions *See Row Information* High fall risk - low, moderate, and high requirements implemented  Neurological  Neuro (WDL) X (HOH)  Level of Consciousness Responds to Voice  Orientation Level Oriented X4  Cognition Appropriate at baseline  R Hand Grip Moderate  L Hand Grip Moderate   RUE Sensation Full sensation  RUE Motor Strength 4  LUE Sensation Full sensation  LUE Motor Strength 4  RLE Sensation Full sensation  RLE Motor Strength 4  LLE Sensation Pain  LLE Motor Strength 3  Neuro Symptoms Anxiety  Musculoskeletal  Musculoskeletal (WDL) X  Assistive Device Wheelchair  Generalized Weakness Yes  Weight Bearing Restrictions Yes  LLE Weight Bearing NWB  Musculoskeletal Details  LLE AKA  Integumentary  Integumentary (WDL) X  Skin Color Appropriate for ethnicity  Skin Condition Dry  Skin Integrity Surgical Incision (see LDA)

## 2014-05-18 NOTE — Progress Notes (Signed)
Received report from IR, recommended to be on bed rest for 2-3 hours post procedure and have HOB elevated 30 degrees or more for that time as well.  Brita Romp, RN

## 2014-05-18 NOTE — Procedures (Signed)
Successful IVC FILTER INSERTION No comp Stable Full report in PACS  

## 2014-05-19 ENCOUNTER — Inpatient Hospital Stay (HOSPITAL_COMMUNITY): Payer: Medicare Other | Admitting: Physical Therapy

## 2014-05-19 ENCOUNTER — Inpatient Hospital Stay (HOSPITAL_COMMUNITY): Payer: Medicare Other | Admitting: *Deleted

## 2014-05-19 ENCOUNTER — Inpatient Hospital Stay (HOSPITAL_COMMUNITY): Payer: Medicare Other

## 2014-05-19 LAB — CBC WITH DIFFERENTIAL/PLATELET
BASOS ABS: 0 10*3/uL (ref 0.0–0.1)
Basophils Relative: 0 % (ref 0–1)
Eosinophils Absolute: 0.2 10*3/uL (ref 0.0–0.7)
Eosinophils Relative: 2 % (ref 0–5)
HCT: 26 % — ABNORMAL LOW (ref 36.0–46.0)
HEMOGLOBIN: 8.5 g/dL — AB (ref 12.0–15.0)
Lymphocytes Relative: 12 % (ref 12–46)
Lymphs Abs: 1.3 10*3/uL (ref 0.7–4.0)
MCH: 30.9 pg (ref 26.0–34.0)
MCHC: 32.7 g/dL (ref 30.0–36.0)
MCV: 94.5 fL (ref 78.0–100.0)
MONO ABS: 0.8 10*3/uL (ref 0.1–1.0)
Monocytes Relative: 7 % (ref 3–12)
Neutro Abs: 8.8 10*3/uL — ABNORMAL HIGH (ref 1.7–7.7)
Neutrophils Relative %: 79 % — ABNORMAL HIGH (ref 43–77)
Platelets: 363 10*3/uL (ref 150–400)
RBC: 2.75 MIL/uL — ABNORMAL LOW (ref 3.87–5.11)
RDW: 13.5 % (ref 11.5–15.5)
WBC: 11.1 10*3/uL — AB (ref 4.0–10.5)

## 2014-05-19 LAB — BASIC METABOLIC PANEL
Anion gap: 13 (ref 5–15)
BUN: 5 mg/dL — ABNORMAL LOW (ref 6–23)
CO2: 24 mEq/L (ref 19–32)
CREATININE: 1 mg/dL (ref 0.50–1.10)
Calcium: 9.2 mg/dL (ref 8.4–10.5)
Chloride: 104 mEq/L (ref 96–112)
GFR calc Af Amer: 68 mL/min — ABNORMAL LOW (ref >90)
GFR calc non Af Amer: 59 mL/min — ABNORMAL LOW (ref >90)
Glucose, Bld: 104 mg/dL — ABNORMAL HIGH (ref 70–99)
Potassium: 3.8 mEq/L (ref 3.7–5.3)
Sodium: 141 mEq/L (ref 137–147)

## 2014-05-19 LAB — GLUCOSE, CAPILLARY: GLUCOSE-CAPILLARY: 102 mg/dL — AB (ref 70–99)

## 2014-05-19 NOTE — Progress Notes (Signed)
05/19/14 1014 nsg Patient had small to medium loose stools no blood noted.

## 2014-05-19 NOTE — Progress Notes (Signed)
Subjective/Complaints:   Alert this am. Anxious. RN reports that she had a good night.  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 rate 17, weight 72.8 kg (160 lb 7.9 oz), SpO2 100.00%. 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 Patient can no longer be on anticoagulation.  EXAM: ULTRASOUND GUIDANCE FOR VASCULAR ACCESS  IVC CATHETERIZATION AND VENOGRAM  IVC FILTER INSERTION  Date:  10/23/201510/23/2015 9:42 am  Radiologist:  M. Daryll Brod, MD  Guidance:  Ultrasound and fluoroscopic  FLUOROSCOPY TIME:  1 min 42 seconds  MEDICATIONS AND MEDICAL HISTORY: 1.5 mg Versed said, 75 mcg fentanyl  ANESTHESIA/SEDATION: 15 min  CONTRAST:  52m OMNIPAQUE IOHEXOL 300 MG/ML  SOLN  COMPLICATIONS: None immediate  PROCEDURE: Informed consent was obtained from the patient following explanation of the procedure, risks, benefits and alternatives. The patient understands, agrees and consents for the procedure. All questions were addressed. A time out was performed.  Maximal barrier sterile technique utilized including caps, mask, sterile gowns, sterile gloves, large sterile drape, hand hygiene, and betadine prep.  Under sterile condition and local anesthesia, right internal jugular venous access was performed with ultrasound. Over a guide wire, the IVC filter delivery sheath and inner dilator were advanced into the IVC just above the IVC bifurcation. Contrast injection was performed for an IVC venogram.  IVC VENOGRAM: The IVC is patent. No evidence of thrombus, stenosis, or occlusion. No variant venous anatomy. The renal veins are identified at L2.  IVC FILTER INSERTION: Through the delivery sheath, the Bard Denali IVC filter was deployed in the infrarenal IVC at the  L3 level just below the renal veins and above the IVC bifurcation. Contrast injection confirmed position. There is good apposition of the filter against the IVC.  The delivery sheath was removed and hemostasis was obtained with compression for 5 minutes. The patient tolerated the procedure well. No immediate complications.  IMPRESSION: Ultrasound and fluoroscopically guided infrarenal IVC filter insertion.   Electronically Signed   By: TDaryll BrodM.D.   On: 05/18/2014 10:06   Results for orders placed during the hospital encounter of 05/08/14 (from the past 72 hour(s))  OCCULT BLOOD X 1 CARD TO LAB, STOOL     Status: Abnormal   Collection Time    05/16/14 12:52 PM      Result Value Ref Range   Fecal Occult Bld POSITIVE (*) NEGATIVE  OCCULT BLOOD X 1 CARD TO LAB, STOOL     Status: Abnormal   Collection Time    05/16/14  2:13 PM      Result Value Ref Range   Fecal Occult Bld POSITIVE (*) NEGATIVE  CBC WITH DIFFERENTIAL     Status: Abnormal   Collection Time    05/16/14  2:15 PM      Result Value Ref Range   WBC 16.1 (*) 4.0 - 10.5 K/uL   RBC 2.76 (*) 3.87 - 5.11 MIL/uL   Hemoglobin 8.7 (*) 12.0 - 15.0 g/dL   HCT 26.8 (*) 36.0 - 46.0 %   MCV 97.1  78.0 - 100.0 fL   MCH 31.5  26.0 - 34.0 pg   MCHC 32.5  30.0 - 36.0 g/dL   RDW 13.6  11.5 - 15.5 %   Platelets 426 (*)  150 - 400 K/uL   Neutrophils Relative % 80 (*) 43 - 77 %   Neutro Abs 13.0 (*) 1.7 - 7.7 K/uL   Lymphocytes Relative 10 (*) 12 - 46 %   Lymphs Abs 1.5  0.7 - 4.0 K/uL   Monocytes Relative 9  3 - 12 %   Monocytes Absolute 1.5 (*) 0.1 - 1.0 K/uL   Eosinophils Relative 1  0 - 5 %   Eosinophils Absolute 0.1  0.0 - 0.7 K/uL   Basophils Relative 0  0 - 1 %   Basophils Absolute 0.0  0.0 - 0.1 K/uL  BASIC METABOLIC PANEL     Status: Abnormal   Collection Time    05/16/14  2:15 PM      Result Value Ref Range   Sodium 138  137 - 147 mEq/L   Potassium 4.5  3.7 - 5.3 mEq/L   Chloride 100  96 - 112 mEq/L   CO2 25  19 - 32  mEq/L   Glucose, Bld 137 (*) 70 - 99 mg/dL   BUN 15  6 - 23 mg/dL   Creatinine, Ser 1.41 (*) 0.50 - 1.10 mg/dL   Calcium 9.5  8.4 - 10.5 mg/dL   GFR calc non Af Amer 39 (*) >90 mL/min   GFR calc Af Amer 45 (*) >90 mL/min   Comment: (NOTE)     The eGFR has been calculated using the CKD EPI equation.     This calculation has not been validated in all clinical situations.     eGFR's persistently <90 mL/min signify possible Chronic Kidney     Disease.   Anion gap 13  5 - 15  URINALYSIS, ROUTINE W REFLEX MICROSCOPIC     Status: Abnormal   Collection Time    05/16/14  5:41 PM      Result Value Ref Range   Color, Urine AMBER (*) YELLOW   Comment: BIOCHEMICALS MAY BE AFFECTED BY COLOR   APPearance CLEAR  CLEAR   Specific Gravity, Urine 1.022  1.005 - 1.030   pH 5.5  5.0 - 8.0   Glucose, UA NEGATIVE  NEGATIVE mg/dL   Hgb urine dipstick NEGATIVE  NEGATIVE   Bilirubin Urine SMALL (*) NEGATIVE   Ketones, ur NEGATIVE  NEGATIVE mg/dL   Protein, ur NEGATIVE  NEGATIVE mg/dL   Urobilinogen, UA 0.2  0.0 - 1.0 mg/dL   Nitrite NEGATIVE  NEGATIVE   Leukocytes, UA NEGATIVE  NEGATIVE   Comment: MICROSCOPIC NOT DONE ON URINES WITH NEGATIVE PROTEIN, BLOOD, LEUKOCYTES, NITRITE, OR GLUCOSE <1000 mg/dL.  URINE CULTURE     Status: None   Collection Time    05/16/14  5:42 PM      Result Value Ref Range   Specimen Description URINE, CATHETERIZED     Special Requests NONE     Culture  Setup Time       Value: 05/16/2014 22:24     Performed at [place] Count       Value: NO GROWTH     Performed at [facility]   Culture       Value: NO GROWTH     Performed at [facility]   Report Status 05/17/2014 FINAL    CBC     Status: Abnormal   Collection Time    05/17/14  4:24 AM      Result Value Ref Range   WBC 15.9 (*) 4.0 - 10.5 K/uL   RBC  2.61 (*) 3.87 - 5.11 MIL/uL   Hemoglobin 8.2 (*) 12.0 - 15.0 g/dL   HCT 25.4 (*) 36.0 - 46.0 %   MCV 97.3  78.0 - 100.0 fL    MCH 31.4  26.0 - 34.0 pg   MCHC 32.3  30.0 - 36.0 g/dL   RDW 13.7  11.5 - 15.5 %   Platelets 387  150 - 400 K/uL  PROTIME-INR     Status: Abnormal   Collection Time    05/17/14 10:28 AM      Result Value Ref Range   Prothrombin Time 29.1 (*) 11.6 - 15.2 seconds   INR 2.72 (*) 0.00 - 1.61  BASIC METABOLIC PANEL     Status: Abnormal   Collection Time    05/17/14 10:28 AM      Result Value Ref Range   Sodium 140  137 - 147 mEq/L   Potassium 4.5  3.7 - 5.3 mEq/L   Chloride 103  96 - 112 mEq/L   CO2 24  19 - 32 mEq/L   Glucose, Bld 119 (*) 70 - 99 mg/dL   BUN 11  6 - 23 mg/dL   Creatinine, Ser 1.12 (*) 0.50 - 1.10 mg/dL   Calcium 9.0  8.4 - 10.5 mg/dL   GFR calc non Af Amer 51 (*) >90 mL/min   GFR calc Af Amer 59 (*) >90 mL/min   Comment: (NOTE)     The eGFR has been calculated using the CKD EPI equation.     This calculation has not been validated in all clinical situations.     eGFR's persistently <90 mL/min signify possible Chronic Kidney     Disease.   Anion gap 13  5 - 15  OCCULT BLOOD X 1 CARD TO LAB, STOOL     Status: Abnormal   Collection Time    05/17/14  5:15 PM      Result Value Ref Range   Fecal Occult Bld POSITIVE (*) NEGATIVE  CBC WITH DIFFERENTIAL     Status: Abnormal   Collection Time    05/18/14 12:00 AM      Result Value Ref Range   WBC 13.3 (*) 4.0 - 10.5 K/uL   Comment: WHITE COUNT CONFIRMED ON SMEAR   RBC 2.30 (*) 3.87 - 5.11 MIL/uL   Hemoglobin 7.6 (*) 12.0 - 15.0 g/dL   HCT 22.5 (*) 36.0 - 46.0 %   MCV 97.8  78.0 - 100.0 fL   MCH 33.0  26.0 - 34.0 pg   MCHC 33.8  30.0 - 36.0 g/dL   RDW 13.6  11.5 - 15.5 %   Platelets 360  150 - 400 K/uL   Neutrophils Relative % 79 (*) 43 - 77 %   Lymphocytes Relative 11 (*) 12 - 46 %   Monocytes Relative 8  3 - 12 %   Eosinophils Relative 2  0 - 5 %   Basophils Relative 0  0 - 1 %   Neutro Abs 10.4 (*) 1.7 - 7.7 K/uL   Lymphs Abs 1.5  0.7 - 4.0 K/uL   Monocytes Absolute 1.1 (*) 0.1 - 1.0 K/uL   Eosinophils  Absolute 0.3  0.0 - 0.7 K/uL   Basophils Absolute 0.0  0.0 - 0.1 K/uL   Smear Review MORPHOLOGY UNREMARKABLE    OCCULT BLOOD X 1 CARD TO LAB, STOOL     Status: None   Collection Time    05/18/14  8:00 PM      Result  Value Ref Range   Fecal Occult Bld NEGATIVE  NEGATIVE  CBC WITH DIFFERENTIAL     Status: Abnormal   Collection Time    05/19/14  5:00 AM      Result Value Ref Range   WBC 11.1 (*) 4.0 - 10.5 K/uL   RBC 2.75 (*) 3.87 - 5.11 MIL/uL   Hemoglobin 8.5 (*) 12.0 - 15.0 g/dL   HCT 26.0 (*) 36.0 - 46.0 %   MCV 94.5  78.0 - 100.0 fL   MCH 30.9  26.0 - 34.0 pg   MCHC 32.7  30.0 - 36.0 g/dL   RDW 13.5  11.5 - 15.5 %   Platelets 363  150 - 400 K/uL   Neutrophils Relative % 79 (*) 43 - 77 %   Neutro Abs 8.8 (*) 1.7 - 7.7 K/uL   Lymphocytes Relative 12  12 - 46 %   Lymphs Abs 1.3  0.7 - 4.0 K/uL   Monocytes Relative 7  3 - 12 %   Monocytes Absolute 0.8  0.1 - 1.0 K/uL   Eosinophils Relative 2  0 - 5 %   Eosinophils Absolute 0.2  0.0 - 0.7 K/uL   Basophils Relative 0  0 - 1 %   Basophils Absolute 0.0  0.0 - 0.1 K/uL  BASIC METABOLIC PANEL     Status: Abnormal   Collection Time    05/19/14  5:00 AM      Result Value Ref Range   Sodium 141  137 - 147 mEq/L   Potassium 3.8  3.7 - 5.3 mEq/L   Chloride 104  96 - 112 mEq/L   CO2 24  19 - 32 mEq/L   Glucose, Bld 104 (*) 70 - 99 mg/dL   BUN 5 (*) 6 - 23 mg/dL   Creatinine, Ser 1.00  0.50 - 1.10 mg/dL   Calcium 9.2  8.4 - 10.5 mg/dL   GFR calc non Af Amer 59 (*) >90 mL/min   GFR calc Af Amer 68 (*) >90 mL/min   Comment: (NOTE)     The eGFR has been calculated using the CKD EPI equation.     This calculation has not been validated in all clinical situations.     eGFR's persistently <90 mL/min signify possible Chronic Kidney     Disease.   Anion gap 13  5 - 15      HEENT: poor dentition Cardio: RRR and no murmur Resp: CTA B/L and unlabored GI: BS positive and NT, ND Extremity:  Pulses positive and No Edema. Right  foot Skin:   Intact and Wound C/D/I  Neuro: Flat, Cranial Nerve II-XII normal, Abnormal Motor 4/5 in RUE, 3- R HF, 4/5 R KE, ADF, 5/5 LUE, 3/5 L HF, Abnormal FMC Ataxic/ dec FMC and Dysarthric Musc/Skel:  Swelling Left thigh no erythema. Leg sensitive still to touch Gen NAD Psych: anxious this am   Assessment/Plan: 1. 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 Physiatrist is providing close team supervision and 24 hour management of active medical problems listed below. Physiatrist and rehab team continue to assess barriers to discharge/monitor patient progress toward functional and medical goals. FIM: FIM - Bathing Bathing Steps Patient Completed: Chest;Right Arm;Left Arm;Front perineal area;Right upper leg;Left upper leg;Abdomen;Right lower leg (including foot) Bathing: 4: Min-Patient completes 8-9 [id number] parts or 75+ percent  FIM - Upper Body Dressing/Undressing Upper body dressing/undressing steps patient  completed: Thread/unthread right bra strap;Thread/unthread left bra strap;Hook/unhook bra;Thread/unthread right sleeve of pullover shirt/dresss;Thread/unthread left sleeve of pullover shirt/dress;Put head through opening of pull over shirt/dress;Pull shirt over trunk Upper body dressing/undressing: 5: Set-up assist to: Obtain clothing/put away FIM - Lower Body Dressing/Undressing Lower body dressing/undressing steps patient completed: Thread/unthread right pants leg;Thread/unthread left pants leg Lower body dressing/undressing: 3: Mod-Patient completed 50-74% of tasks  FIM - Toileting Toileting steps completed by patient: Performs perineal hygiene Toileting Assistive Devices: Grab bar or rail for support Toileting: 3: Mod-Patient completed 2 of 3 steps  FIM - Radio producer Devices: Environmental consultant;Bedside commode Toilet Transfers: 5-To toilet/BSC:  Supervision (verbal cues/safety issues);5-From toilet/BSC: Supervision (verbal cues/safety issues)  FIM - Engineer, site Assistive Devices: Bed rails Bed/Chair Transfer: 4: Supine > Sit: Min A (steadying Pt. > 75%/lift 1 leg);5: Sit > Supine: Supervision (verbal cues/safety issues)  FIM - Locomotion: Wheelchair Distance: 200 Locomotion: Wheelchair: 0: Activity did not occur FIM - Locomotion: Ambulation Locomotion: Ambulation Assistive Devices: Administrator Ambulation/Gait Assistance: 4: Min guard Locomotion: Ambulation: 0: Activity did not occur  Comprehension Comprehension Mode: Visual Comprehension: 5-Understands basic 90% of the time/requires cueing < 10% of the time  Expression Expression Mode: Verbal Expression Assistive Devices: 6-Communication board Expression: 5-Expresses basic 90% of the time/requires cueing < 10% of the time.  Social Interaction Social Interaction Mode: Asleep Social Interaction: 4-Interacts appropriately 75 - 89% of the time - Needs redirection for appropriate language or to initiate interaction.  Problem Solving Problem Solving: 4-Solves basic 75 - 89% of the time/requires cueing 10 - 24% of the time  Memory Memory: 4-Recognizes or recalls 75 - 89% of the time/requires cueing 10 - 24% of the time  Medical Problem List and Plan:  1. 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  2. LLE DVT/Anticoagulation: Pharmaceutical: Xarelto (decreased)  -s/p IVCF placement yesterday  -if HGB stable and clinically doing well consider adding back ASA on Monday---plavix at a later date potentially 3. Pain Management:  -oxycontin stopped due to lethargy  -continue oxycodone prn. Due to hearing impairment and aphasia , has difficulty communicating need for prn meds -anxiety is a major issue 4. Mood: Continue cymbalta for pain as well as mood stabilization. Team to provide ego support and encouragement to help with participation.  LCSW to follow for evaluation and support.  5. Neuropsych: This patient is not capable of making decisions on her own behalf.  6. Skin/Wound Care: wound clean 7. Fluids/Electrolytes/Nutrition: Monitor intake for adequate hydration and nutritional status.   -hold IVF for now. BMET normal today 8. HTN: Will monitor every 8 hours. Continue Coreg. Hold Altace given hypotension.  9. CAD: Monitor for symptoms with recommendations to add nitrate if needed. Continue coreg bid, Altace and Lipitor.  10. Recent L-MCA infarct residual mild R HP: on no meds at present.  11. Hypokalemia: resolved past supplementation ----labs ok today 12. Leucocytosis: decreased to 13k today. Temp better   -cultures negative r  -dc empiric cipro    13. Acute blood loss anemia:    -stool still OB+. RN reports no BRB  -holding xarelto (62m currently) for now  -appreciate GI assist and agree with conservative approach  - IVCF per IR  -hgb up to 8.5 today  --continue to follow serially    LOS (Days) 11 A FACE TO FACE EVALUATION WAS PERFORMED  Traci Mclaughlin T 05/19/2014, 7:58 AM

## 2014-05-19 NOTE — Progress Notes (Signed)
Alfredo Bach 10:07 AM  Subjective: Patient seen and examined and case discussed with my partner in her hospital computer chart was reviewed and we did have a nice conversation on the marker board Objective: Vital signs stable afebrile no acute distress abdomen is soft nontender hemoglobin stable  Assessment: Multiple medical problems including guaiac positive anemia  Plan: Agree patient needs a GI workup and I have asked her nurse to either sure a colonoscopy video or downloaded some written information and I will return tomorrow to discuss workup further and she's never had a colonoscopy before  [facility] E

## 2014-05-19 NOTE — Progress Notes (Signed)
05/19/14 1142 nursing Patient has no questions Re: colonoscopy; per patient she had colonoscopy 7 years ago.

## 2014-05-19 NOTE — Progress Notes (Signed)
Physical Therapy Session Note  Patient Details  Name: Traci Mclaughlin MRN: [id number] Date of Birth: [birth year]  Today's Date: 05/19/2014 PT Individual Time: 0938-1829 PT Individual Time Calculation (min): 30 min   Short Term Goals: Week 1:  PT Short Term Goal 1 (Week 1): =LTGs due to estimated LOS  Skilled Therapeutic Interventions/Progress Updates:  Pt was seen bedside in the pm, reluctant to participate but agreed to participate with therapy in the room. Pt transferred supine to edge of bed with side rail and min A. Pt tolerated edge of bed about 15 minutes with S. At edge of bed pt performed R LE exercises. Performed multiple sit to stand with rolling walker and S. While standing worked on L hip extension. Pt ambulated in room x 2 with S to min guard for 15 feet. Pt fatigued easily with ambulation. Pt transferred edge of bed to supine with S and bed rail.   Therapy Documentation Precautions:  Precautions Precautions: Fall Precaution Comments: LLE AKA-HOH- needs written communication Restrictions Weight Bearing Restrictions: Yes LLE Weight Bearing: Non weight bearing Other Position/Activity Restrictions: wears Darco sandal to unweight L toes General:   Pain: No c/o pain.    Locomotion : Ambulation Ambulation/Gait Assistance: 5: Supervision;4: Min guard   See FIM for current functional status  Therapy/Group: Individual Therapy  Dub Amis 05/19/2014, 3:16 PM

## 2014-05-19 NOTE — Progress Notes (Signed)
Physical Therapy Session Note  Patient Details  Name: Traci Mclaughlin MRN: [id number] Date of Birth: 06-04-1951  Today's Date: 05/19/2014 PT Individual Time: 3845-3646 PT Individual Time Calculation (min): 45 min   Short Term Goals: Week 2:  PT Short Term Goal 1 (Week 2): =LTGs due to ELOS  Skilled Therapeutic Interventions/Progress Updates:    Therapeutic Activity: PT instructs pt in supine to long sit to sit edge of bed req verbal cues and encouragement to participate. PT instructs pt in doffing R heel flotation boot and SCD, which she does with supervision, pulling up non-skid yellow sock on R foot. RN changes pt's L residual limb dressing during pt's rest break. PT instructs pt in bed to w/c transfer with RW req SBA and verbal cues for hand placement. PT instructs pt in donning L shrinker req max A first in sit and then in stand so PT could pull shrinker high enough to get wrinkles out.   Therapeutic Exercise: PT instructs pt in standing L hip extension 3 x 10 reps for improved ROM in order to prepare L LE for obtaining a prosthetic limb. Pt req PT assist to stabilize trunk in order to prevent compensation by patient.   Neuromuscular Reeducation: PT instructs pt in TUG and pt scores: 143 seconds first attempt, 110 seconds second attempt.   Pt is beginning to return to recent PLOF, prior to fever and IVC filter placement. Pt's TUG score is worse than previously, but this is due to pt recently deconditioning. Pt less tearful during session today - RN premedication with anti-anxiety may be the reason. Continue per PT POC.   Therapy Documentation Precautions:  Precautions Precautions: Fall Precaution Comments: LLE AKA-HOH- needs written communication Restrictions Weight Bearing Restrictions: Yes LLE Weight Bearing: Non weight bearing Other Position/Activity Restrictions: wears Darco sandal to unweight L toes Pain: Pain Assessment Pain Assessment: 0-10 Pain Score: 8  Pain Type:  Phantom pain Pain Location: Leg Pain Orientation: Left Pain Descriptors / Indicators: Aching Pain Onset: On-going Pain Intervention(s): Medication (See eMAR);Emotional support Multiple Pain Sites: No  Balance: Balance Balance Assessed: Yes Standardized Balance Assessment Standardized Balance Assessment: Timed Up and Go Test Timed Up and Go Test TUG: Normal TUG Normal TUG (seconds): 110  See FIM for current functional status  Therapy/Group: Individual Therapy  Icesis Renn M 05/19/2014, 8:54 AM

## 2014-05-19 NOTE — Progress Notes (Signed)
Physical Therapy Session Note  Patient Details  Name: Traci Mclaughlin MRN: [id number] Date of Birth: [birth year]  Today's Date: 05/19/2014 PT Individual Time: 1045-1130 PT Individual Time Calculation (min): 45 min   Short Term Goals: Week 2:  PT Short Term Goal 1 (Week 2): =LTGs due to ELOS  Skilled Therapeutic Interventions/Progress Updates:  Tx focused on therex for strengthening and pre-prosthetic ROM, activity tolerance, and functional mobility training in room with RW. Pt initially disagreeable to PT, but with encouragement, she was willing to participate. Pt needed max encouragement throughout for each new task.   Instructed pt in supine and prone therex, provided AKA HEP handout for visual cues:  - RLE ankle pumps and quad sets - glute sets - modified bridging - sidelying hip ext - prone passive hip ext stretch x36min   Performed multiple R/L rolling with min A and tactile cues for technique. Pt had difficulty problem solving/anxiet on transferring to stomach, but was able to manage with encouragement, rolling over R side. Pt comfortable once in position, but hesitant to fully roll prone, but only few degrees shy.   Performed sit<>stand with close S to RW and close S for functional ambulation x15' with RW. Pt limited by fatigue with hopping technique. With max encouragement and education, pt agreeable to stay up in [facility] with all needs in reach for lunch. Nursing aware and checking on her.       Therapy Documentation Precautions:  Precautions Precautions: Fall Precaution Comments: LLE AKA-HOH- needs written communication Restrictions Weight Bearing Restrictions: Yes LLE Weight Bearing: Non weight bearing Other Position/Activity Restrictions: wears Darco sandal to unweight L toes Pain: Pain Assessment Pain Assessment: 0-10 Pain Score: 8  Pain Type: Phantom pain Pain Location: Leg Pain Orientation: Left Repositioned, allowed rest breaks, and provided emotional  support  Locomotion : Ambulation Ambulation/Gait Assistance: 5: Supervision      Balance: Balance Balance Assessed: Yes Standardized Balance Assessment Standardized Balance Assessment: Timed Up and Go Test Timed Up and Go Test TUG: Normal TUG Normal TUG (seconds): 110     See FIM for current functional status  Therapy/Group: Individual Therapy Kennieth Rad, PT, DPT   Dwaine Deter, [place] 05/19/2014, 12:30 PM

## 2014-05-19 NOTE — Progress Notes (Signed)
Occupational Therapy Session Note  Patient Details  Name: Traci Mclaughlin MRN: [id number] Date of Birth: September 03, 1950  Today's Date: 05/19/2014 OT Individual Time: 1400-1500 OT Individual Time Calculation (min): 60 min    Short Term Goals: Week 2:  OT Short Term Goal 1 (Week 2): STGs = LTGs  Skilled Therapeutic Interventions/Progress Updates:    Pt seen for 1:1 OT session with focus on functional transfers, standing balance, activity tolerance, and BUE strengthening. Pt received supine in bed agreeable to therapy with min encouragement. Engaged in horseshoe game in standing 3x with CGA-SBA for standing balance. Pt required long rest breaks between each game. Completed dressing sitting EOB with mod assist LB dressing and min assist standing balance. Completed stand pivot transfer bed>w/c with min guard assist. Pt propelled self approx 80 feet towards family room for BUE strengthening and activity tolerance. Pt propelled self in tight space to retrieve book from bookshelf and bring to room. Pt propelled self approx 60 feet in w/c with increased time and rest breaks. Upon return to room, pt transferred back to bed at supervision level and left with all needs in reach. Pt reporting "very thankful" for book to keep in room.    Therapy Documentation Precautions:  Precautions Precautions: Fall Precaution Comments: LLE AKA-HOH- needs written communication Restrictions Weight Bearing Restrictions: Yes LLE Weight Bearing: Non weight bearing Other Position/Activity Restrictions: wears Darco sandal to unweight L toes General:   Vital Signs: Therapy Vitals Temp: 97.9 F (36.6 C) Temp Source: Oral Pulse Rate: 89 Resp: 20 BP: 112/60 mmHg Patient Position (if appropriate): Sitting Oxygen Therapy SpO2: 98 % O2 Device: Not Delivered Pain: No report of pain during therapy session.   See FIM for current functional status  Therapy/Group: Individual Therapy  Duayne Cal 05/19/2014,  2:54 PM

## 2014-05-19 NOTE — Plan of Care (Signed)
Problem: Communication Impairment Goal: Ability to express needs and understand communication Min assist  Outcome: Not Progressing Uses white board to communicate ; pt feels frustrated at times

## 2014-05-19 NOTE — Progress Notes (Signed)
05/19/14 1040 nursing Patient provided with colonoscopy notes ; patient very cooperative today and she said she will read about it ; explained to her what the procedure is about she nods her head.RN told patient to ask questions after reading the notes pt agreed.

## 2014-05-20 ENCOUNTER — Inpatient Hospital Stay (HOSPITAL_COMMUNITY): Payer: Medicare Other

## 2014-05-20 ENCOUNTER — Encounter (HOSPITAL_COMMUNITY): Payer: Medicare Other

## 2014-05-20 LAB — CBC WITH DIFFERENTIAL/PLATELET
Basophils Absolute: 0 10*3/uL (ref 0.0–0.1)
Basophils Relative: 0 % (ref 0–1)
Eosinophils Absolute: 0.3 10*3/uL (ref 0.0–0.7)
Eosinophils Relative: 3 % (ref 0–5)
HCT: 27 % — ABNORMAL LOW (ref 36.0–46.0)
Hemoglobin: 8.8 g/dL — ABNORMAL LOW (ref 12.0–15.0)
LYMPHS ABS: 1.4 10*3/uL (ref 0.7–4.0)
Lymphocytes Relative: 14 % (ref 12–46)
MCH: 30.9 pg (ref 26.0–34.0)
MCHC: 32.6 g/dL (ref 30.0–36.0)
MCV: 94.7 fL (ref 78.0–100.0)
Monocytes Absolute: 0.7 10*3/uL (ref 0.1–1.0)
Monocytes Relative: 7 % (ref 3–12)
NEUTROS ABS: 7.4 10*3/uL (ref 1.7–7.7)
NEUTROS PCT: 76 % (ref 43–77)
Platelets: 365 10*3/uL (ref 150–400)
RBC: 2.85 MIL/uL — AB (ref 3.87–5.11)
RDW: 13.5 % (ref 11.5–15.5)
WBC: 9.7 10*3/uL (ref 4.0–10.5)

## 2014-05-20 MED ORDER — MAGNESIUM HYDROXIDE 400 MG/5ML PO SUSP
30.0000 mL | Freq: Once | ORAL | Status: DC
  Filled 2014-05-20: qty 30

## 2014-05-20 MED ORDER — FLEET ENEMA 7-19 GM/118ML RE ENEM: 1.0000 | ENEMA | Freq: Once | RECTAL | Status: DC

## 2014-05-20 MED ORDER — SODIUM CHLORIDE 0.9 % IV SOLN
INTRAVENOUS | Status: DC
  Administered 2014-05-20: 10 mL/h via INTRAVENOUS

## 2014-05-20 MED ORDER — PEG 3350-KCL-NA BICARB-NACL 420 G PO SOLR
4000.0000 mL | Freq: Once | ORAL | Status: AC
  Administered 2014-05-20: 4000 mL via ORAL
  Filled 2014-05-20: qty 4000

## 2014-05-20 MED ORDER — INFLUENZA VAC SPLIT QUAD 0.5 ML IM SUSY
0.5000 mL | PREFILLED_SYRINGE | Freq: Once | INTRAMUSCULAR | Status: DC
Start: 2014-05-20 — End: 2014-05-20
  Filled 2014-05-20: qty 0.5

## 2014-05-20 NOTE — Progress Notes (Signed)
Patient did not have BM this shift.  Oval Linsey, RN

## 2014-05-20 NOTE — Progress Notes (Signed)
Physical Therapy Session Note  Patient Details  Name: Traci Mclaughlin MRN: [id number] Date of Birth: 1950-09-28  Today's Date: 05/20/2014 PT Individual Time: 0800-0900 PT Individual Time Calculation (min): 60 min  Session 2 Time: 1400-1445 Time Calculation (min) 45 min  Short Term Goals: Week 2:  PT Short Term Goal 1 (Week 2): =LTGs due to ELOS  Skilled Therapeutic Interventions/Progress Updates:    Session 1: Pt received supine in bed, agreeable to participate in therapy after dressing and performing oral hygiene. Pt moved supine>sit from flat bed w/o bed rails and supervision. For dynamic seated balance pt sat EOB and dressed UB w/ set up assist and dressed LB w/ MinA for threading R leg through pants and steadying assist to stand and pull pants up. SPT w/ RW and Min Guard, noted that pt pivoted foot on ground instead of using hopping technique. Pt navigated wheelchair in tight spaces around room w/ mod (I)  in order to perform oral care and grooming at sink. Propelled w/c 100' to ADL apartment at very slow speed, supervision/min guard to navigate environment. In ADL apartment pt walked 10' to bed w/ RW and min guard. Performed x10 push ups through BUE in standing to promote increased foot clearance in gait. Pt attempted to clear foot over theraband placed in front of foot, able to hop halfway length of her foot x3. Performed x10 seated HS curls w/ orange theraband for LLE strengthening. Pt ambulated 10' back to w/c w/ RW and Min Guard, then propelled w/c 50' before reporting fatigue, pt transported remaining distance to room. Pt left seated in w/c, reporting 7/10 pain in RLE. RN made aware and on her way to room.    Session 2: Pt received supine in bed, agreeable to participate in therapy in room after declining therapy outside of room even w/ max encouragement. 2x10 standing hip extension, hip abduction on LLE and standing glute sets, SBA to remain standing w/ exercises, ModA to maintain hip  extension at hip instead of trunk. In supine pt performed x10 isometric hip extension w/ small towel roll under RLE. Pt agreed to attempt prone position to address hip flexor tightness after max encouragement. Pt moved to R sidelying, tried several times to roll to prone but had intense pain in L residual limb every time she rolled from sidelying towards prone. After prone attempt pt very fatigued, requested that session end early. Pt missed 15 minutes of scheduled PT time.    Therapy Documentation Precautions:  Precautions Precautions: Fall Precaution Comments: LLE AKA-HOH- needs written communication Restrictions Weight Bearing Restrictions: Yes LLE Weight Bearing: Non weight bearing Other Position/Activity Restrictions: wears Darco sandal to unweight L toes General:   Vital Signs: Therapy Vitals Temp: 98.4 F (36.9 C) Temp Source: Oral Pulse Rate: 83 Resp: 16 BP: 143/70 mmHg Patient Position (if appropriate): Lying Oxygen Therapy SpO2: 96 % O2 Device: Not Delivered Pain:   10/10. After mirror therapy, pt did not rate pain but said that it was better.  See FIM for current functional status  Therapy/Group: Individual Therapy  Rada Hay Rada Hay, PT, DPT 05/20/2014, 7:32 AM

## 2014-05-20 NOTE — Progress Notes (Signed)
Subjective/Complaints:   Had a pretty good night. Had questions about potential "surgery" .  Review of Systems - limited by hearing loss and aphasia   Objective: Vital Signs: Blood pressure 143/70, pulse 83, temperature 98.4 F (36.9 C), temperature source Oral, resp. rate 16, weight 70.3 kg (154 lb 15.7 oz), SpO2 96.00%. 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 Patient can no longer be on anticoagulation.  EXAM: ULTRASOUND GUIDANCE FOR VASCULAR ACCESS  IVC CATHETERIZATION AND VENOGRAM  IVC FILTER INSERTION  Date:  10/23/201510/23/2015 9:42 am  Radiologist:  M. Daryll Brod, MD  Guidance:  Ultrasound and fluoroscopic  FLUOROSCOPY TIME:  1 min 42 seconds  MEDICATIONS AND MEDICAL HISTORY: 1.5 mg Versed said, 75 mcg fentanyl  ANESTHESIA/SEDATION: 15 min  CONTRAST:  35m OMNIPAQUE IOHEXOL 300 MG/ML  SOLN  COMPLICATIONS: None immediate  PROCEDURE: Informed consent was obtained from the patient following explanation of the procedure, risks, benefits and alternatives. The patient understands, agrees and consents for the procedure. All questions were addressed. A time out was performed.  Maximal barrier sterile technique utilized including caps, mask, sterile gowns, sterile gloves, large sterile drape, hand hygiene, and betadine prep.  Under sterile condition and local anesthesia, right internal jugular venous access was performed with ultrasound. Over a guide wire, the IVC filter delivery sheath and inner dilator were advanced into the IVC just above the IVC bifurcation. Contrast injection was performed for an IVC venogram.  IVC VENOGRAM: The IVC is patent. No evidence of thrombus, stenosis, or occlusion. No variant venous anatomy. The renal veins are identified at L2.  IVC FILTER INSERTION: Through the delivery sheath, the Bard Denali IVC filter was deployed in the infrarenal IVC at  the L3 level just below the renal veins and above the IVC bifurcation. Contrast injection confirmed position. There is good apposition of the filter against the IVC.  The delivery sheath was removed and hemostasis was obtained with compression for 5 minutes. The patient tolerated the procedure well. No immediate complications.  IMPRESSION: Ultrasound and fluoroscopically guided infrarenal IVC filter insertion.   Electronically Signed   By: TDaryll BrodM.D.   On: 05/18/2014 10:06   Results for orders placed during the hospital encounter of 05/08/14 (from the past 72 hour(s))  PROTIME-INR     Status: Abnormal   Collection Time    05/17/14 10:28 AM      Result Value Ref Range   Prothrombin Time 29.1 (*) 11.6 - 15.2 seconds   INR 2.72 (*) 0.00 - 10.56 BASIC METABOLIC PANEL     Status: Abnormal   Collection Time    05/17/14 10:28 AM      Result Value Ref Range   Sodium 140  137 - 147 mEq/L   Potassium 4.5  3.7 - 5.3 mEq/L   Chloride 103  96 - 112 mEq/L   CO2 24  19 - 32 mEq/L   Glucose, Bld 119 (*) 70 - 99 mg/dL   BUN 11  6 - 23 mg/dL   Creatinine, Ser 1.12 (*) 0.50 - 1.10 mg/dL   Calcium 9.0  8.4 - 10.5 mg/dL   GFR calc non Af Amer 51 (*) >90 mL/min   GFR calc Af Amer 59 (*) >90 mL/min   Comment: (NOTE)     The eGFR has been calculated using the CKD EPI equation.     This calculation  has not been validated in all clinical situations.     eGFR's persistently <90 mL/min signify possible Chronic Kidney     Disease.   Anion gap 13  5 - 15  OCCULT BLOOD X 1 CARD TO LAB, STOOL     Status: Abnormal   Collection Time    05/17/14  5:15 PM      Result Value Ref Range   Fecal Occult Bld POSITIVE (*) NEGATIVE  CBC WITH DIFFERENTIAL     Status: Abnormal   Collection Time    05/18/14 12:00 AM      Result Value Ref Range   WBC 13.3 (*) 4.0 - 10.5 K/uL   Comment: WHITE COUNT CONFIRMED ON SMEAR   RBC 2.30 (*) 3.87 - 5.11 MIL/uL   Hemoglobin 7.6 (*) 12.0 - 15.0 g/dL   HCT 22.5 (*) 36.0 - 46.0 %    MCV 97.8  78.0 - 100.0 fL   MCH 33.0  26.0 - 34.0 pg   MCHC 33.8  30.0 - 36.0 g/dL   RDW 13.6  11.5 - 15.5 %   Platelets 360  150 - 400 K/uL   Neutrophils Relative % 79 (*) 43 - 77 %   Lymphocytes Relative 11 (*) 12 - 46 %   Monocytes Relative 8  3 - 12 %   Eosinophils Relative 2  0 - 5 %   Basophils Relative 0  0 - 1 %   Neutro Abs 10.4 (*) 1.7 - 7.7 K/uL   Lymphs Abs 1.5  0.7 - 4.0 K/uL   Monocytes Absolute 1.1 (*) 0.1 - 1.0 K/uL   Eosinophils Absolute 0.3  0.0 - 0.7 K/uL   Basophils Absolute 0.0  0.0 - 0.1 K/uL   Smear Review MORPHOLOGY UNREMARKABLE    OCCULT BLOOD X 1 CARD TO LAB, STOOL     Status: None   Collection Time    05/18/14  8:00 PM      Result Value Ref Range   Fecal Occult Bld NEGATIVE  NEGATIVE  CBC WITH DIFFERENTIAL     Status: Abnormal   Collection Time    05/19/14  5:00 AM      Result Value Ref Range   WBC 11.1 (*) 4.0 - 10.5 K/uL   RBC 2.75 (*) 3.87 - 5.11 MIL/uL   Hemoglobin 8.5 (*) 12.0 - 15.0 g/dL   HCT 26.0 (*) 36.0 - 46.0 %   MCV 94.5  78.0 - 100.0 fL   MCH 30.9  26.0 - 34.0 pg   MCHC 32.7  30.0 - 36.0 g/dL   RDW 13.5  11.5 - 15.5 %   Platelets 363  150 - 400 K/uL   Neutrophils Relative % 79 (*) 43 - 77 %   Neutro Abs 8.8 (*) 1.7 - 7.7 K/uL   Lymphocytes Relative 12  12 - 46 %   Lymphs Abs 1.3  0.7 - 4.0 K/uL   Monocytes Relative 7  3 - 12 %   Monocytes Absolute 0.8  0.1 - 1.0 K/uL   Eosinophils Relative 2  0 - 5 %   Eosinophils Absolute 0.2  0.0 - 0.7 K/uL   Basophils Relative 0  0 - 1 %   Basophils Absolute 0.0  0.0 - 0.1 K/uL  BASIC METABOLIC PANEL     Status: Abnormal   Collection Time    05/19/14  5:00 AM      Result Value Ref Range   Sodium 141  137 - 147 mEq/L  Potassium 3.8  3.7 - 5.3 mEq/L   Chloride 104  96 - 112 mEq/L   CO2 24  19 - 32 mEq/L   Glucose, Bld 104 (*) 70 - 99 mg/dL   BUN 5 (*) 6 - 23 mg/dL   Creatinine, Ser 1.00  0.50 - 1.10 mg/dL   Calcium 9.2  8.4 - 10.5 mg/dL   GFR calc non Af Amer 59 (*) >90 mL/min   GFR  calc Af Amer 68 (*) >90 mL/min   Comment: (NOTE)     The eGFR has been calculated using the CKD EPI equation.     This calculation has not been validated in all clinical situations.     eGFR's persistently <90 mL/min signify possible Chronic Kidney     Disease.   Anion gap 13  5 - 15  GLUCOSE, CAPILLARY     Status: Abnormal   Collection Time    05/19/14  8:47 PM      Result Value Ref Range   Glucose-Capillary 102 (*) 70 - 99 mg/dL      HEENT: poor dentition Cardio: RRR and no murmur Resp: CTA B/L and unlabored GI: BS positive and NT, ND Extremity:  Pulses positive and No Edema. Right foot Skin:   Intact and Wound C/D/I  Neuro: Flat, Cranial Nerve II-XII normal, Abnormal Motor 4/5 in RUE, 3- R HF, 4/5 R KE, ADF, 5/5 LUE, 3/5 L HF, Abnormal FMC Ataxic/ dec FMC and Dysarthric Musc/Skel:  Swelling Left thigh no erythema. Leg sensitive still to touch Gen NAD Psych: anxious this am   Assessment/Plan: 1. 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 Physiatrist is providing close team supervision and 24 hour management of active medical problems listed below. Physiatrist and rehab team continue to assess barriers to discharge/monitor patient progress toward functional and medical goals. FIM: FIM - Bathing Bathing Steps Patient Completed: Chest;Right Arm;Left Arm;Front perineal area;Right upper leg;Left upper leg;Abdomen;Right lower leg (including foot) Bathing: 4: Min-Patient completes 8-9 60f10 parts or 75+ percent  FIM - Upper Body Dressing/Undressing Upper body dressing/undressing steps patient completed: Thread/unthread right bra strap;Thread/unthread left bra strap;Hook/unhook bra;Thread/unthread right sleeve of pullover shirt/dresss;Thread/unthread left sleeve of pullover shirt/dress;Put head through opening of pull over shirt/dress;Pull shirt over trunk Upper body  dressing/undressing: 5: Set-up assist to: Obtain clothing/put away FIM - Lower Body Dressing/Undressing Lower body dressing/undressing steps patient completed: Thread/unthread right pants leg;Thread/unthread left pants leg Lower body dressing/undressing: 3: Mod-Patient completed 50-74% of tasks  FIM - Toileting Toileting steps completed by patient: Performs perineal hygiene Toileting Assistive Devices: Grab bar or rail for support Toileting: 3: Mod-Patient completed 2 of 3 steps  FIM - TRadio producerDevices: WEnvironmental consultantBedside commode Toilet Transfers: 5-To toilet/BSC: Supervision (verbal cues/safety issues);5-From toilet/BSC: Supervision (verbal cues/safety issues)  FIM - BControl and instrumentation engineerDevices: Bed rails Bed/Chair Transfer: 5: Supine > Sit: Supervision (verbal cues/safety issues);5: Sit > Supine: Supervision (verbal cues/safety issues)  FIM - Locomotion: Wheelchair Distance: 200 Locomotion: Wheelchair: 0: Activity did not occur FIM - Locomotion: Ambulation Locomotion: Ambulation Assistive Devices: WAdministratorAmbulation/Gait Assistance: 5: Supervision;4: Min guard Locomotion: Ambulation: 1: Travels less than 50 ft with minimal assistance (Pt.>75%)  Comprehension Comprehension Mode: Visual Comprehension: 5-Understands complex 90% of the time/Cues < 10% of the time  Expression Expression Mode: Verbal;Nonverbal Expression Assistive Devices: 6-Communication board Expression: 5-Expresses complex 90% of the time/cues <  10% of the time  Social Interaction Social Interaction Mode: Asleep Social Interaction: 5-Interacts appropriately 90% of the time - Needs monitoring or encouragement for participation or interaction.  Problem Solving Problem Solving: 5-Solves complex 90% of the time/cues < 10% of the time  Memory Memory: 4-Recognizes or recalls 75 - 89% of the time/requires cueing 10 - 24% of the time  Medical  Problem List and Plan:  1. 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  2. LLE DVT/Anticoagulation: Pharmaceutical: Xarelto (decreased)  -s/p IVCF placement yesterday  -if HGB stable and clinically doing well consider adding back ASA on Monday---plavix at a later date potentially 3. Pain Management:  -oxycontin stopped due to lethargy  -continue oxycodone prn. Due to hearing impairment and aphasia , has difficulty communicating need for prn meds -anxiety is a major issue 4. Mood: Continue cymbalta for pain as well as mood stabilization. Team to provide ego support and encouragement to help with participation. LCSW to follow for evaluation and support.  5. Neuropsych: This patient is not capable of making decisions on her own behalf.  6. Skin/Wound Care: wound clean 7. Fluids/Electrolytes/Nutrition: Monitor intake for adequate hydration and nutritional status.   -hold IVF for now. BMET normal today 8. HTN: Will monitor every 8 hours. Continue Coreg. Hold Altace given hypotension.  9. CAD: Monitor for symptoms with recommendations to add nitrate if needed. Continue coreg bid, Altace and Lipitor.  10. Recent L-MCA infarct residual mild R HP: on no meds at present.  11. Hypokalemia: resolved past supplementation ----labs ok today 12. Leucocytosis: decreased to 13k today. Temp better   -cultures negative r  -off empiric cipro    13. Acute blood loss anemia:    -no further BRBPR  -holding xarelto (25m currently) for now  -appreciate GI assist. colonscopy at some point. Here vs after dc?  - IVCF per IR  -hgb up to 8.5 Sat, lab pending today  LOS (Days) 12 A FACE TO FACE EVALUATION WAS PERFORMED  SWARTZ,ZACHARY T 05/20/2014, 7:24 AM

## 2014-05-20 NOTE — Progress Notes (Signed)
Occupational Therapy Session Note  Patient Details  Name: Traci Mclaughlin MRN: [id number] Date of Birth: Feb 28, 1951  Today's Date: 05/20/2014 OT Individual Time: 1000-1100 OT Individual Time Calculation (min): 60 min    Short Term Goals: Week 2:  OT Short Term Goal 1 (Week 2): STGs = LTGs  Skilled Therapeutic Interventions/Progress Updates: Therapeutic activity with focus on transfers, assisted grooming (shaving facial hair), functional mobility and use of AE (w/c gloves), and dynamic standing activity in kitchen: cleaning countertop.   Pt received supine in bed, requiring use of communication board to orient to situation.    Pt was initially disinterested in therapy but with time confided her embarrassment with growth of her facial hair as a problem she was unable to resolve.   OT offered to assist with grooming as needed at sink which motivated pt to exit bed, transfer to w/c, and perform w/c mobility to sink.   Pt then groomed at sink with max assist for shaving needed for thoroughness.   Pt expressed satisfaction with assist and was motivated to attempt increased w/c mobility, enhanced by use of women's w/c gloves provided d/t c/o of discomfort when self-propelling w/c.      Pt propelled w/c for approx 50' with close supervision and was then escorted to ortho gym to perform car transfer.   Pt completed transfer in car without direction from therapist with mod assist (lowering) and one verbal cue to re-assess w/c position going in but only min assist (steadying and placement of right hand) coming out of car.    Pt was then escorted to kitchen and encouraged to attempt dynamic standing task: clean countertop while standing supported with right leg and left or right arm for at least 10 seconds.   Pt completed cleaning task as directed, standing for approx 90 seconds using right LE and alternating support with both left and right UE while wiping counter.   Pt participated well throughout session  although required use of communication board frequently for instruction and clarification.   Pt returned to bed at end of session with all needs placed within reach, bed alarm activated.    Therapy Documentation Precautions:  Precautions Precautions: Fall Precaution Comments: LLE AKA-HOH- needs written communication Restrictions Weight Bearing Restrictions: Yes LLE Weight Bearing: Non weight bearing Other Position/Activity Restrictions: wears Darco sandal to unweight L toes    Pain: No/denies pain    See FIM for current functional status  Therapy/Group: Individual Therapy  [name] 05/20/2014, 3:28 PM

## 2014-05-20 NOTE — Progress Notes (Signed)
Traci Mclaughlin 12:35 PM  Subjective: Patient without any complaints and read the colonoscopy information and we discussed it as best we could  Objective: Vital signs stable afebrile no acute distress hemoglobin stable abdomen is soft nontender  Assessment: Multiple medical problems including episodic guaiac positive anemia  Plan: I discussed how important the prep is with both the patient and her nurse and will proceed with a colonoscopy and endoscopy if needed tomorrow with further workup and plans pending those findings  [facility] E

## 2014-05-21 ENCOUNTER — Encounter (HOSPITAL_COMMUNITY): Payer: Medicare Other | Admitting: Certified Registered Nurse Anesthetist

## 2014-05-21 ENCOUNTER — Telehealth: Payer: Self-pay | Admitting: *Deleted

## 2014-05-21 ENCOUNTER — Encounter (HOSPITAL_COMMUNITY): Payer: Medicare Other | Admitting: *Deleted

## 2014-05-21 ENCOUNTER — Encounter (HOSPITAL_COMMUNITY): Payer: Self-pay | Admitting: Certified Registered Nurse Anesthetist

## 2014-05-21 ENCOUNTER — Ambulatory Visit: Payer: Medicare Other | Admitting: Medical

## 2014-05-21 ENCOUNTER — Inpatient Hospital Stay (HOSPITAL_COMMUNITY): Payer: Medicare Other

## 2014-05-21 ENCOUNTER — Encounter (HOSPITAL_COMMUNITY)
Admission: RE | Disposition: A | Discharge status: Home Health | Payer: Self-pay | Source: Intra-hospital | Attending: Physical Medicine & Rehabilitation

## 2014-05-21 ENCOUNTER — Inpatient Hospital Stay (HOSPITAL_COMMUNITY): Payer: Medicare Other | Admitting: Certified Registered Nurse Anesthetist

## 2014-05-21 DIAGNOSIS — Z0289 Encounter for other administrative examinations: Secondary | ICD-10-CM

## 2014-05-21 HISTORY — PX: HX UPPER ENDOSCOPY: 2100001144

## 2014-05-21 HISTORY — PX: COLONOSCOPY WITH PROPOFOL: SHX5780

## 2014-05-21 HISTORY — PX: ESOPHAGOGASTRODUODENOSCOPY (EGD) WITH PROPOFOL: SHX5813

## 2014-05-21 LAB — CBC WITH DIFFERENTIAL/PLATELET
Basophils Absolute: 0 10*3/uL (ref 0.0–0.1)
Basophils Relative: 0 % (ref 0–1)
Eosinophils Absolute: 0.2 10*3/uL (ref 0.0–0.7)
Eosinophils Relative: 2 % (ref 0–5)
HEMATOCRIT: 26.9 % — AB (ref 36.0–46.0)
HEMOGLOBIN: 8.7 g/dL — AB (ref 12.0–15.0)
LYMPHS PCT: 18 % (ref 12–46)
Lymphs Abs: 1.5 10*3/uL (ref 0.7–4.0)
MCH: 31.3 pg (ref 26.0–34.0)
MCHC: 32.3 g/dL (ref 30.0–36.0)
MCV: 96.8 fL (ref 78.0–100.0)
MONO ABS: 0.6 10*3/uL (ref 0.1–1.0)
MONOS PCT: 7 % (ref 3–12)
NEUTROS ABS: 6 10*3/uL (ref 1.7–7.7)
NEUTROS PCT: 73 % (ref 43–77)
Platelets: 371 10*3/uL (ref 150–400)
RBC: 2.78 MIL/uL — AB (ref 3.87–5.11)
RDW: 13.5 % (ref 11.5–15.5)
WBC: 8.3 10*3/uL (ref 4.0–10.5)

## 2014-05-21 SURGERY — ESOPHAGOGASTRODUODENOSCOPY (EGD) WITH PROPOFOL
Anesthesia: General

## 2014-05-21 MED ORDER — CARVEDILOL 6.25 MG PO TABS: 18.7500 mg | ORAL_TABLET | Freq: Two times a day (BID) | ORAL | Status: DC

## 2014-05-21 MED ORDER — LACTATED RINGERS IV SOLN
INTRAVENOUS | Status: DC
  Administered 2014-05-21: 1000 mL via INTRAVENOUS

## 2014-05-21 MED ORDER — SODIUM CHLORIDE 0.9 % IV SOLN: INTRAVENOUS | Status: DC

## 2014-05-21 MED ORDER — FLEET ENEMA 7-19 GM/118ML RE ENEM
1.0000 | ENEMA | RECTAL | Status: AC
Start: 2014-05-21 — End: 2014-05-21
  Administered 2014-05-21: 1 via RECTAL
  Filled 2014-05-21: qty 1

## 2014-05-21 MED ORDER — OXYCODONE-ACETAMINOPHEN 10-325 MG PO TABS: 1.0000 | ORAL_TABLET | Freq: Four times a day (QID) | ORAL | Status: DC | PRN

## 2014-05-21 MED ORDER — OXYCODONE-ACETAMINOPHEN 5-325 MG PO TABS
1.0000 | ORAL_TABLET | Freq: Four times a day (QID) | ORAL | Status: DC | PRN
  Administered 2014-05-23 – 2014-05-25 (×4): 1 via ORAL
  Filled 2014-05-21 (×4): qty 1

## 2014-05-21 MED ORDER — BUTAMBEN-TETRACAINE-BENZOCAINE 2-2-14 % EX AERO
INHALATION_SPRAY | CUTANEOUS | Status: DC | PRN
  Administered 2014-05-21: 2 via TOPICAL

## 2014-05-21 MED ORDER — RIVAROXABAN 20 MG PO TABS
20.0000 mg | ORAL_TABLET | Freq: Every day | ORAL | Status: DC
  Administered 2014-05-21: 20 mg via ORAL
  Filled 2014-05-21 (×2): qty 1

## 2014-05-21 MED ORDER — PROPOFOL INFUSION 10 MG/ML OPTIME
INTRAVENOUS | Status: DC | PRN
  Administered 2014-05-21: 100 ug/kg/min via INTRAVENOUS

## 2014-05-21 MED ORDER — DOCUSATE SODIUM 100 MG PO CAPS
100.0000 mg | ORAL_CAPSULE | Freq: Two times a day (BID) | ORAL | Status: DC
  Administered 2014-05-21 – 2014-05-25 (×9): 100 mg via ORAL
  Filled 2014-05-21 (×10): qty 1

## 2014-05-21 MED ORDER — LACTATED RINGERS IV SOLN
INTRAVENOUS | Status: DC | PRN
  Administered 2014-05-21 (×2): via INTRAVENOUS

## 2014-05-21 MED ORDER — PROPOFOL 10 MG/ML IV BOLUS
INTRAVENOUS | Status: DC | PRN
  Administered 2014-05-21: 50 mg via INTRAVENOUS

## 2014-05-21 MED ORDER — DULOXETINE HCL 30 MG PO CPEP: 30.0000 mg | ORAL_CAPSULE | Freq: Every day | ORAL | Status: DC

## 2014-05-21 MED ORDER — OXYCODONE HCL 5 MG PO TABS
5.0000 mg | ORAL_TABLET | Freq: Four times a day (QID) | ORAL | Status: DC | PRN
  Administered 2014-05-21 – 2014-05-24 (×4): 5 mg via ORAL
  Filled 2014-05-21 (×4): qty 1

## 2014-05-21 MED ORDER — RAMIPRIL 5 MG PO CAPS
5.0000 mg | ORAL_CAPSULE | Freq: Every day | ORAL | Status: DC
  Administered 2014-05-21 – 2014-05-25 (×5): 5 mg via ORAL
  Filled 2014-05-21 (×6): qty 1

## 2014-05-21 MED ORDER — ATORVASTATIN CALCIUM 40 MG PO TABS: 40.0000 mg | ORAL_TABLET | Freq: Every day | ORAL | Status: DC

## 2014-05-21 NOTE — Progress Notes (Signed)
This Probation officer called and notified the GI on call around 22:10 pm that even the patient finished the 4000 ml of Polyethylene glycol-electrolytes the patient has no  clear bowel movement after 9 pm. Dr. Kenneshia Rehm Gong ordered milk of magnesia but the patient refused. Patient was in tears she stated that " I already have 5 bowel movement today ". I tried to explained that she needs it for her colonoscopy but the patient keep on saying no. I called  Dr. Telma Pyeatt Gong again around 12:15 am and informed him that the patient is non-compliant with the milk of magnesia.

## 2014-05-21 NOTE — Op Note (Signed)
[facility] [place], [id number]   ENDOSCOPY PROCEDURE REPORT  PATIENT: Traci, Mclaughlin  MR#: [id number] BIRTHDATE: [birth year] , 63  yrs. old GENDER: female ENDOSCOPIST: Laurence Spates, MD REFERRED BY:  Alger Simons, M.D. PROCEDURE DATE:  06/02/2014 PROCEDURE:  EGD, diagnostic ASA CLASS:     Class IV INDICATIONS:  melena, hemocult positive stool, and acute post hemorrhagic anemia. MEDICATIONS: Monitored anesthesia care TOPICAL ANESTHETIC: Cetacaine Spray  DESCRIPTION OF PROCEDURE: After the risks benefits and alternatives of the procedure were thoroughly explained, informed consent was obtained.  The EC-[id number] ([id number]) endoscope was introduced through the mouth and advanced to the second portion of the duodenum , Without limitations.  The instrument was slowly withdrawn as the mucosa was fully examined.      ESOPHAGUS: Normal no active bleeding.   GE junction widely patent.   STOMACH: The mucosa of the stomach appeared normal.  DUODENUM: The duodenal mucosa showed no abnormalities.  Retroflexed views revealed no abnormalities.     The scope was then withdrawn from the patient and the procedure completed.  COMPLICATIONS: There were no immediate complications.  ENDOSCOPIC IMPRESSION: 1.   Normal no active bleeding 2.   GE junction widely patent 3.   The mucosa of the stomach appeared normal 4.   The duodenal mucosa showed no abnormalities  RECOMMENDATIONS: 1.  No UGI source of bleeding 2.  Proceed with a Colonoscopy.  REPEAT EXAM:  eSigned:  Laurence Spates, MD June 02, 2014 2:34 PM    CC:  CPT CODES: ICD CODES:  The ICD and CPT codes recommended by this software are interpretations from the data that the clinical staff has captured with the software.  The verification of the translation of this report to the ICD and CPT codes and modifiers is the sole responsibility of the health care institution and  practicing physician where this report was generated.  [employer] will not be held responsible for the validity of the ICD and CPT codes included on this report.  AMA assumes no liability for data contained or not contained herein. CPT is a Designer, television/film set of the [employer].  PATIENT NAME:  Traci, Mclaughlin MR#: [id number]

## 2014-05-21 NOTE — Telephone Encounter (Signed)
Pt did not show for appointment 05/21/2014 at 10am for follow up. Pt does show 9am appointment with PT at [place], not sure they arrived for that appointment.

## 2014-05-21 NOTE — H&P (View-Only) (Signed)
Traci Mclaughlin 12:35 PM  Subjective: Patient without any complaints and read the colonoscopy information and we discussed it as best we could  Objective: Vital signs stable afebrile no acute distress hemoglobin stable abdomen is soft nontender  Assessment: Multiple medical problems including episodic guaiac positive anemia  Plan: I discussed how important the prep is with both the patient and her nurse and will proceed with a colonoscopy and endoscopy if needed tomorrow with further workup and plans pending those findings  [facility] E

## 2014-05-21 NOTE — Transfer of Care (Signed)
Immediate Anesthesia Transfer of Care Note  Patient: Traci Mclaughlin  Procedure(s) Performed: Procedure(s): ESOPHAGOGASTRODUODENOSCOPY (EGD) WITH PROPOFOL (N/A) COLONOSCOPY WITH PROPOFOL (N/A)  Patient Location: Endoscopy Unit  Anesthesia Type:MAC  Level of Consciousness: sedated  Airway & Oxygen Therapy: Patient Spontanous Breathing and Patient connected to nasal cannula oxygen  Post-op Assessment: Report given to PACU RN and Post -op Vital signs reviewed and stable  Post vital signs: Reviewed and stable  Complications: No apparent anesthesia complications

## 2014-05-21 NOTE — Op Note (Signed)
[facility] [place], [address]   COLONOSCOPY PROCEDURE REPORT     EXAM DATE: 05/26/14  PATIENT NAME:      Traci, Mclaughlin           MR #:      [id number]  BIRTHDATE:       [birth year]      VISIT #:     [id number]  ATTENDING:     Laurence Spates, MD     STATUS:     inpatient REFERRING MD:      Alger Simons, M.D. ASA CLASS:        Class IV  INDICATIONS:  The patient is a 63 yr old female here for a colonoscopy due to hematochezia. PROCEDURE PERFORMED:     Colonoscopy with biopsy MEDICATIONS:     Monitored anesthesia care ESTIMATED BLOOD LOSS:     None  CONSENT: The patient understands the risks and benefits of the procedure and understands that these risks include, but are not limited to: sedation, allergic reaction, infection, perforation and/or bleeding. Alternative means of evaluation and treatment include, among others: physical exam, x-rays, and/or surgical intervention. The patient elects to proceed with this endoscopic procedure.  DESCRIPTION OF PROCEDURE: During intra-op preparation period all mechanical & medical equipment was checked for proper function. Hand hygiene and appropriate measures for infection prevention was taken. After the risks, benefits and alternatives of the procedure were thoroughly explained, Informed consent was verified, confirmed and timeout was successfully executed by the treatment team. A digital exam revealed no abnormalities of the rectum.      The Pentax Adult Colon [id number] endoscope was introduced through the anus and advanced to the cecum. No adverse events experienced. The prep was Fair prep; viscous stool obscured some views; diminutive lesions could have been missed.. The instrument was then slowly withdrawn as the colon was fully examined.   COLON FINDINGS: Normal AC, TC, DC [place] except few diverticuli. Multiple large non-bleeding and shallow ulcers were found in the rectum.   Biopsies were taken at edge of the ulcers.   Large internal hemorrhoids were found.     The scope was then completely withdrawn from the patient and the procedure terminated. WITHDRAWAL TIME:    ADVERSE EVENTS:      There were no immediate complications.  IMPRESSIONS:     1.  Normal AC, TC, DC [place] except few diverticuli 2.  Multiple large ulcers were found in the rectum; biopsies were taken Probably Stercoral ulcers due to constipation. 3.  Large internal hemorrhoids  RECOMMENDATIONS:     Chronic miralax to keep stools soft RECALL:  Laurence Spates, MD eSigned:  Laurence Spates, MD May 26, 2014 2:45 PM   cc:  CPT CODES: ICD CODES:  The ICD and CPT codes recommended by this software are interpretations from the data that the clinical staff has captured with the software.  The verification of the translation of this report to the ICD and CPT codes and modifiers is the sole responsibility of the health care institution and practicing physician where this report was generated.  [employer] will not be held responsible for the validity of the ICD and CPT codes included on this report.  AMA assumes no liability for data contained or not contained herein. CPT is a Designer, television/film set of the [employer].   PATIENT NAME:  Traci, Mclaughlin MR#: [id number]

## 2014-05-21 NOTE — Anesthesia Preprocedure Evaluation (Signed)
Anesthesia Evaluation   Patient awake    Reviewed: Allergy & Precautions, H&P , NPO status , Patient's Chart, lab work & pertinent test results, reviewed documented beta blocker date and time   Airway Mallampati: I   Neck ROM: Full    Dental  (+) Teeth Intact   Pulmonary former smoker,  breath sounds clear to auscultation        Cardiovascular hypertension, Pt. on home beta blockers + CAD Rhythm:Regular Rate:Normal     Neuro/Psych    GI/Hepatic   Endo/Other    Renal/GU      Musculoskeletal   Abdominal   Peds  Hematology   Anesthesia Other Findings   Reproductive/Obstetrics                             Anesthesia Physical Anesthesia Plan  ASA: III  Anesthesia Plan: General   Post-op Pain Management:    Induction: Intravenous  Airway Management Planned: Simple Face Mask and Oral ETT  Additional Equipment:   Intra-op Plan:   Post-operative Plan: Extubation in OR  Informed Consent: I have reviewed the patients History and Physical, chart, labs and discussed the procedure including the risks, benefits and alternatives for the proposed anesthesia with the patient or authorized representative who has indicated his/her understanding and acceptance.     Plan Discussed with: CRNA, Anesthesiologist and Surgeon  Anesthesia Plan Comments:         Anesthesia Quick Evaluation

## 2014-05-21 NOTE — Progress Notes (Signed)
Occupational Therapy Session Note  Patient Details  Name: Traci Mclaughlin MRN: [id number] Date of Birth: [birth year]  Today's Date: 05/21/2014 OT Individual Time:  -   1100-1200  (60 min)      Short Term Goals: Week 1:  OT Short Term Goal 1 (Week 1): LTG=STG due to short LOS Week 2:  OT Short Term Goal 1 (Week 2): STGs = LTGs  Skilled Therapeutic Interventions/Progress Updates:    Pt. Lying in bed upon OT arrival.  She stated she was going for surgery and did not want to do therapy.  Clarified that she was having a colostomy and could bath and groom.  Pt. Agreed.  Sat EOB for bathing.  Pt. Did sit to stand for peri care with minimal assist for balance.  Performed supine and rolling in bed with SBA with increased LB thoroughness.  Pt. Sat again to EOB and performed grooming with set up for supplies.  Pt. Left in bed at end of session with all needs in reach.    Therapy Documentation Precautions:  Precautions Precautions: Fall Precaution Comments: LLE AKA-HOH- needs written communication Restrictions Weight Bearing Restrictions: Yes LLE Weight Bearing: Non weight bearing (to stump) Other Position/Activity Restrictions: wears Darco sandal to unweight L toes  Pain: Pain Assessment Pain Assessment: Faces  Pain Scale: 3/10  On residual leg            See FIM for current functional status  Therapy/Group: Individual Therapy  Lisa Roca 05/21/2014, 11:24 AM

## 2014-05-21 NOTE — Progress Notes (Signed)
Administered enema per MD order.  Results- yellow liquid, moderate amount.  Brita Romp, RN

## 2014-05-21 NOTE — Progress Notes (Signed)
Physical Therapy Note  Patient Details  Name: Avila Albritton MRN: [id number] Date of Birth: 1950-08-08 Today's Date: 05/21/2014    Pt supine in bed upon therapist arrival. Pt complaining of increased stomach pain and discomfort after receiving enema and laxative in preparation for colonoscopy. Pt declined participation in out of bed and bed level therapy, even after max encouragement. Pt missed 60 minutes of scheduled PT.    Rada Hay 05/21/2014, 9:49 AM

## 2014-05-21 NOTE — Progress Notes (Addendum)
Physical Therapy Note  Patient Details  Name: Traci Mclaughlin MRN: [id number] Date of Birth: Jan 01, 1951 Today's Date: 05/21/2014    At scheduled therapy time pt was off unit for scheduled colonoscopy/endoscopy. Pt missed 60 minutes scheduled physical therapy time. Will follow up per POC.  Rada Hay Rada Hay, PT, DPT 05/21/2014, 5:09 PM

## 2014-05-21 NOTE — Progress Notes (Signed)
Subjective/Complaints:   In reasonable spirits. Pain seems controlled. Understands that she's having a colonoscopy today  Review of Systems - limited by hearing loss and aphasia   Objective: Vital Signs: Blood pressure 134/99, pulse 97, temperature 98.8 F (37.1 C), temperature source Oral, resp. rate 19, weight 73 kg (160 lb 15 oz), SpO2 98.00%. No results found. Results for orders placed during the hospital encounter of 05/08/14 (from the past 72 hour(s))  OCCULT BLOOD X 1 CARD TO LAB, STOOL     Status: None   Collection Time    05/18/14  8:00 PM      Result Value Ref Range   Fecal Occult Bld NEGATIVE  NEGATIVE  CBC WITH DIFFERENTIAL     Status: Abnormal   Collection Time    05/19/14  5:00 AM      Result Value Ref Range   WBC 11.1 (*) 4.0 - 10.5 K/uL   RBC 2.75 (*) 3.87 - 5.11 MIL/uL   Hemoglobin 8.5 (*) 12.0 - 15.0 g/dL   HCT 26.0 (*) 36.0 - 46.0 %   MCV 94.5  78.0 - 100.0 fL   MCH 30.9  26.0 - 34.0 pg   MCHC 32.7  30.0 - 36.0 g/dL   RDW 13.5  11.5 - 15.5 %   Platelets 363  150 - 400 K/uL   Neutrophils Relative % 79 (*) 43 - 77 %   Neutro Abs 8.8 (*) 1.7 - 7.7 K/uL   Lymphocytes Relative 12  12 - 46 %   Lymphs Abs 1.3  0.7 - 4.0 K/uL   Monocytes Relative 7  3 - 12 %   Monocytes Absolute 0.8  0.1 - 1.0 K/uL   Eosinophils Relative 2  0 - 5 %   Eosinophils Absolute 0.2  0.0 - 0.7 K/uL   Basophils Relative 0  0 - 1 %   Basophils Absolute 0.0  0.0 - 0.1 K/uL  BASIC METABOLIC PANEL     Status: Abnormal   Collection Time    05/19/14  5:00 AM      Result Value Ref Range   Sodium 141  137 - 147 mEq/L   Potassium 3.8  3.7 - 5.3 mEq/L   Chloride 104  96 - 112 mEq/L   CO2 24  19 - 32 mEq/L   Glucose, Bld 104 (*) 70 - 99 mg/dL   BUN 5 (*) 6 - 23 mg/dL   Creatinine, Ser 1.00  0.50 - 1.10 mg/dL   Calcium 9.2  8.4 - 10.5 mg/dL   GFR calc non Af Amer 59 (*) >90 mL/min   GFR calc Af Amer 68 (*) >90 mL/min   Comment: (NOTE)     The eGFR has been calculated using the CKD EPI  equation.     This calculation has not been validated in all clinical situations.     eGFR's persistently <90 mL/min signify possible Chronic Kidney     Disease.   Anion gap 13  5 - 15  GLUCOSE, CAPILLARY     Status: Abnormal   Collection Time    05/19/14  8:47 PM      Result Value Ref Range   Glucose-Capillary 102 (*) 70 - 99 mg/dL  CBC WITH DIFFERENTIAL     Status: Abnormal   Collection Time    05/20/14 12:05 PM      Result Value Ref Range   WBC 9.7  4.0 - 10.5 K/uL   RBC 2.85 (*) 3.87 - 5.11 MIL/uL  Hemoglobin 8.8 (*) 12.0 - 15.0 g/dL   HCT 27.0 (*) 36.0 - 46.0 %   MCV 94.7  78.0 - 100.0 fL   MCH 30.9  26.0 - 34.0 pg   MCHC 32.6  30.0 - 36.0 g/dL   RDW 13.5  11.5 - 15.5 %   Platelets 365  150 - 400 K/uL   Neutrophils Relative % 76  43 - 77 %   Neutro Abs 7.4  1.7 - 7.7 K/uL   Lymphocytes Relative 14  12 - 46 %   Lymphs Abs 1.4  0.7 - 4.0 K/uL   Monocytes Relative 7  3 - 12 %   Monocytes Absolute 0.7  0.1 - 1.0 K/uL   Eosinophils Relative 3  0 - 5 %   Eosinophils Absolute 0.3  0.0 - 0.7 K/uL   Basophils Relative 0  0 - 1 %   Basophils Absolute 0.0  0.0 - 0.1 K/uL      HEENT: poor dentition Cardio: RRR and no murmur Resp: CTA B/L and unlabored GI: BS positive and NT, ND Extremity:  Pulses positive and No Edema. Right foot Skin:   Intact and Wound C/D/I  Neuro: Flat, Cranial Nerve II-XII normal, Abnormal Motor 4/5 in RUE, 3- R HF, 4/5 R KE, ADF, 5/5 LUE, 3/5 L HF, Abnormal FMC Ataxic/ dec FMC and Dysarthric Musc/Skel:  Swelling Left thigh no erythema. Leg sensitive still to touch but improved Gen NAD Psych: calm this am   Assessment/Plan: 1. 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 Physiatrist is providing close team supervision and 24 hour management of active medical problems listed below. Physiatrist and rehab team continue to assess barriers to  discharge/monitor patient progress toward functional and medical goals. FIM: FIM - Bathing Bathing Steps Patient Completed: Chest;Right Arm;Left Arm;Front perineal area;Right upper leg;Left upper leg;Abdomen;Right lower leg (including foot) Bathing: 4: Min-Patient completes 8-9 0f 10 parts or 75+ percent  FIM - Upper Body Dressing/Undressing Upper body dressing/undressing steps patient completed: Thread/unthread right bra strap;Thread/unthread left bra strap;Hook/unhook bra;Thread/unthread right sleeve of pullover shirt/dresss;Thread/unthread left sleeve of pullover shirt/dress;Put head through opening of pull over shirt/dress;Pull shirt over trunk Upper body dressing/undressing: 5: Set-up assist to: Obtain clothing/put away FIM - Lower Body Dressing/Undressing Lower body dressing/undressing steps patient completed: Thread/unthread right pants leg;Thread/unthread left pants leg Lower body dressing/undressing: 3: Mod-Patient completed 50-74% of tasks  FIM - Toileting Toileting steps completed by patient: Performs perineal hygiene Toileting Assistive Devices: Grab bar or rail for support Toileting: 3: Mod-Patient completed 2 of 3 steps  FIM - Toilet Transfers Toilet Transfers Assistive Devices: Walker;Bedside commode Toilet Transfers: 5-To toilet/BSC: Supervision (verbal cues/safety issues);5-From toilet/BSC: Supervision (verbal cues/safety issues)  FIM - Bed/Chair Transfer Bed/Chair Transfer Assistive Devices: Walker Bed/Chair Transfer: 5: Supine > Sit: Supervision (verbal cues/safety issues);4: Bed > Chair or W/C: Min A (steadying Pt. > 75%);4: Chair or W/C > Bed: Min A (steadying Pt. > 75%)  FIM - Locomotion: Wheelchair Distance: 200 Locomotion: Wheelchair: 2: Travels 50 - 149 ft with minimal assistance (Pt.>75%) FIM - Locomotion: Ambulation Locomotion: Ambulation Assistive Devices: Walker - Rolling Ambulation/Gait Assistance: 5: Supervision;4: Min guard Locomotion: Ambulation: 1:  Travels less than 50 ft with minimal assistance (Pt.>75%)  Comprehension Comprehension Mode: Visual Comprehension: 5-Follows basic conversation/direction: With no assist  Expression Expression Mode: Verbal Expression Assistive Devices: 6-Communication board Expression: 5-Expresses complex 90% of the time/cues < 10% of   the time (tearful at times)  Social Interaction Social Interaction Mode: Asleep Social Interaction: 6-Interacts appropriately with others with medication or extra time (anti-anxiety, antidepressant).  Problem Solving Problem Solving: 5-Solves complex 90% of the time/cues < 10% of the time  Memory Memory: 4-Recognizes or recalls 75 - 89% of the time/requires cueing 10 - 24% of the time  Medical Problem List and Plan:  1. 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  2. LLE DVT/Anticoagulation: Pharmaceutical: Xarelto (decreased)  -s/p IVCF placement yesterday  -if HGB stable and clinically doing well consider adding back ASA on Monday---plavix at a later date potentially 3. Pain Management:  -oxycontin stopped due to lethargy  -continue oxycodone prn. Due to hearing impairment and aphasia , has difficulty communicating need for prn meds -anxiety is a major issue 4. Mood: Continue cymbalta for pain as well as mood stabilization. Team to provide ego support and encouragement to help with participation. LCSW to follow for evaluation and support.  5. Neuropsych: This patient is not capable of making decisions on her own behalf.  6. Skin/Wound Care: wound clean 7. Fluids/Electrolytes/Nutrition: Monitor intake for adequate hydration and nutritional status.   -hold IVF for now. BMET normal today 8. HTN: Will monitor every 8 hours. Continue Coreg. Hold Altace given hypotension.  9. CAD: Monitor for symptoms with recommendations to add nitrate if needed. Continue coreg bid, Altace and Lipitor.  10. Recent L-MCA infarct residual mild R HP: on no meds at present.  11.  Hypokalemia: resolved past supplementation ----labs ok  12. Leucocytosis: decreased to 13k today. Temp better   -cultures negative   -off empiric cipro    13. Acute blood loss anemia:    -no further BRBPR  -holding xarelto (95m currently) for now  -appreciate GI assist. colonscopy today. Pt understands  - IVCF per IR  -hgb up to 8.8 Sunday,  lab pending today  LOS (Days) 13 A FACE TO FACE EVALUATION WAS PERFORMED  Traci Mclaughlin T 05/21/2014, 7:46 AM

## 2014-05-21 NOTE — Anesthesia Postprocedure Evaluation (Signed)
  Anesthesia Post-op Note  Patient: Traci Mclaughlin  Procedure(s) Performed: Procedure(s): ESOPHAGOGASTRODUODENOSCOPY (EGD) WITH PROPOFOL (N/A) COLONOSCOPY WITH PROPOFOL (N/A)  Patient Location: PACU  Anesthesia Type: General   Level of Consciousness: awake, alert  and oriented  Airway and Oxygen Therapy: Patient Spontanous Breathing  Post-op Pain: mild  Post-op Assessment: Post-op Vital signs reviewed  Post-op Vital Signs: Reviewed  Last Vitals:  Filed Vitals:   05/21/14 1450  BP: 206/106  Pulse: 94  Temp:   Resp: 23    Complications: No apparent anesthesia complications

## 2014-05-21 NOTE — Plan of Care (Signed)
Problem: RH SKIN INTEGRITY Goal: RH STG SKIN FREE OF INFECTION/BREAKDOWN w min assist  Outcome: Not Progressing unstageable wound to sacrum- yellow slough    Goal: RH STG MAINTAIN SKIN INTEGRITY WITH ASSISTANCE STG Maintain Skin Integrity With min Assistance.  Outcome: Not Progressing Requires max cues to wear prevalon boot and turn  Problem: Communication Impairment Goal: Ability to express needs and understand communication Min assist  Outcome: Progressing Uses whiteboard for communication

## 2014-05-21 NOTE — Interval H&P Note (Signed)
History and Physical Interval Note:  05/21/2014 1:23 PM  Traci Mclaughlin  has presented today for surgery, with the diagnosis of positive blood in stool/anemia  The various methods of treatment have been discussed with the patient and family. After consideration of risks, benefits and other options for treatment, the patient has consented to  Procedure(s): ESOPHAGOGASTRODUODENOSCOPY (EGD) WITH PROPOFOL (N/A) COLONOSCOPY WITH PROPOFOL (N/A) as a surgical intervention .  The patient's history has been reviewed, patient examined, no change in status, stable for surgery.  I have reviewed the patient's chart and labs.  Questions were answered to the patient's satisfaction.     Amanuel Sinkfield JR,Felicidad Sugarman L

## 2014-05-22 ENCOUNTER — Inpatient Hospital Stay (HOSPITAL_COMMUNITY): Payer: Medicare Other

## 2014-05-22 ENCOUNTER — Encounter (HOSPITAL_COMMUNITY): Payer: Self-pay | Admitting: Gastroenterology

## 2014-05-22 ENCOUNTER — Inpatient Hospital Stay (HOSPITAL_COMMUNITY): Payer: Medicare Other | Admitting: Occupational Therapy

## 2014-05-22 LAB — CBC WITH DIFFERENTIAL/PLATELET
BASOS ABS: 0 10*3/uL (ref 0.0–0.1)
Basophils Relative: 0 % (ref 0–1)
Eosinophils Absolute: 0.2 10*3/uL (ref 0.0–0.7)
Eosinophils Relative: 2 % (ref 0–5)
HCT: 27.3 % — ABNORMAL LOW (ref 36.0–46.0)
Hemoglobin: 8.9 g/dL — ABNORMAL LOW (ref 12.0–15.0)
LYMPHS PCT: 17 % (ref 12–46)
Lymphs Abs: 1.4 10*3/uL (ref 0.7–4.0)
MCH: 31.2 pg (ref 26.0–34.0)
MCHC: 32.6 g/dL (ref 30.0–36.0)
MCV: 95.8 fL (ref 78.0–100.0)
Monocytes Absolute: 0.8 10*3/uL (ref 0.1–1.0)
Monocytes Relative: 9 % (ref 3–12)
NEUTROS ABS: 5.7 10*3/uL (ref 1.7–7.7)
Neutrophils Relative %: 72 % (ref 43–77)
PLATELETS: 353 10*3/uL (ref 150–400)
RBC: 2.85 MIL/uL — ABNORMAL LOW (ref 3.87–5.11)
RDW: 13.5 % (ref 11.5–15.5)
WBC: 8.1 10*3/uL (ref 4.0–10.5)

## 2014-05-22 MED ORDER — COLLAGENASE 250 UNIT/GM EX OINT
TOPICAL_OINTMENT | Freq: Every day | CUTANEOUS | Status: DC
  Administered 2014-05-22 – 2014-05-25 (×4): via TOPICAL
  Filled 2014-05-22: qty 30

## 2014-05-22 MED ORDER — ASPIRIN EC 325 MG PO TBEC
325.0000 mg | DELAYED_RELEASE_TABLET | Freq: Every day | ORAL | Status: DC
  Administered 2014-05-23 – 2014-05-25 (×3): 325 mg via ORAL
  Filled 2014-05-22 (×4): qty 1

## 2014-05-22 NOTE — Consult Note (Addendum)
WOC wound consult note Reason for Consult: Consult requested for sacrum wound.  Pt has left amputation and sacral wound appears to be the combined result of constant moisture and shear to this site, and pressure is also a factor. Wound type: Unstagerable Pressure Ulcer POA: No Measurement: 1X.3X.1cm Wound bed: 90% yellow, 10% red Drainage (amount, consistency, odor) small amt yellow drainage, no odor Periwound: few patchy areas of partial thickness skin breakdown, to area surrounding inner gluteal fold; appearance is consistent with moisture associated skin damage. Dressing procedure/placement/frequency: Santyl ointment to chemically debride nonviable tissue.  Foam dressing to protect when sliding in and out of bed.  Pt has a gelpad on her wheelchair to reduce pressure.  She is deaf but wrote that she needs to avoid prolonged shear and pressure to sacrum and to increase her protein intake to promote healing.  She verbalizes understanding but says she is not hungry. Discussed plan of care with rehab PA.  Please re-consult if further assistance is needed.  Thank-you,  Julien Girt MSN, [place], [place], [place], [place]

## 2014-05-22 NOTE — Progress Notes (Addendum)
Subjective/Complaints:   A fair night. A little sore  Review of Systems - limited by hearing loss and aphasia   Objective: Vital Signs: Blood pressure 148/85, pulse 99, temperature 98.3 F (36.8 C), temperature source Oral, resp. rate 20, weight 73 kg (160 lb 15 oz), SpO2 96.00%. No results found. Results for orders placed during the hospital encounter of 05/08/14 (from the past 72 hour(s))  GLUCOSE, CAPILLARY     Status: Abnormal   Collection Time    05/19/14  8:47 PM      Result Value Ref Range   Glucose-Capillary 102 (*) 70 - 99 mg/dL  CBC WITH DIFFERENTIAL     Status: Abnormal   Collection Time    05/20/14 12:05 PM      Result Value Ref Range   WBC 9.7  4.0 - 10.5 K/uL   RBC 2.85 (*) 3.87 - 5.11 MIL/uL   Hemoglobin 8.8 (*) 12.0 - 15.0 g/dL   HCT 27.0 (*) 36.0 - 46.0 %   MCV 94.7  78.0 - 100.0 fL   MCH 30.9  26.0 - 34.0 pg   MCHC 32.6  30.0 - 36.0 g/dL   RDW 13.5  11.5 - 15.5 %   Platelets 365  150 - 400 K/uL   Neutrophils Relative % 76  43 - 77 %   Neutro Abs 7.4  1.7 - 7.7 K/uL   Lymphocytes Relative 14  12 - 46 %   Lymphs Abs 1.4  0.7 - 4.0 K/uL   Monocytes Relative 7  3 - 12 %   Monocytes Absolute 0.7  0.1 - 1.0 K/uL   Eosinophils Relative 3  0 - 5 %   Eosinophils Absolute 0.3  0.0 - 0.7 K/uL   Basophils Relative 0  0 - 1 %   Basophils Absolute 0.0  0.0 - 0.1 K/uL  CBC WITH DIFFERENTIAL     Status: Abnormal   Collection Time    05/21/14  5:00 AM      Result Value Ref Range   WBC 8.3  4.0 - 10.5 K/uL   RBC 2.78 (*) 3.87 - 5.11 MIL/uL   Hemoglobin 8.7 (*) 12.0 - 15.0 g/dL   HCT 26.9 (*) 36.0 - 46.0 %   MCV 96.8  78.0 - 100.0 fL   MCH 31.3  26.0 - 34.0 pg   MCHC 32.3  30.0 - 36.0 g/dL   RDW 13.5  11.5 - 15.5 %   Platelets 371  150 - 400 K/uL   Neutrophils Relative % 73  43 - 77 %   Neutro Abs 6.0  1.7 - 7.7 K/uL   Lymphocytes Relative 18  12 - 46 %   Lymphs Abs 1.5  0.7 - 4.0 K/uL   Monocytes Relative 7  3 - 12 %   Monocytes Absolute 0.6  0.1 - 1.0  K/uL   Eosinophils Relative 2  0 - 5 %   Eosinophils Absolute 0.2  0.0 - 0.7 K/uL   Basophils Relative 0  0 - 1 %   Basophils Absolute 0.0  0.0 - 0.1 K/uL  CBC WITH DIFFERENTIAL     Status: Abnormal   Collection Time    05/22/14  7:00 AM      Result Value Ref Range   WBC 8.1  4.0 - 10.5 K/uL   RBC 2.85 (*) 3.87 - 5.11 MIL/uL   Hemoglobin 8.9 (*) 12.0 - 15.0 g/dL   HCT 27.3 (*) 36.0 - 46.0 %   MCV 95.8  78.0 - 100.0 fL   MCH 31.2  26.0 - 34.0 pg   MCHC 32.6  30.0 - 36.0 g/dL   RDW 13.5  11.5 - 15.5 %   Platelets 353  150 - 400 K/uL   Neutrophils Relative % 72  43 - 77 %   Neutro Abs 5.7  1.7 - 7.7 K/uL   Lymphocytes Relative 17  12 - 46 %   Lymphs Abs 1.4  0.7 - 4.0 K/uL   Monocytes Relative 9  3 - 12 %   Monocytes Absolute 0.8  0.1 - 1.0 K/uL   Eosinophils Relative 2  0 - 5 %   Eosinophils Absolute 0.2  0.0 - 0.7 K/uL   Basophils Relative 0  0 - 1 %   Basophils Absolute 0.0  0.0 - 0.1 K/uL      HEENT: poor dentition Cardio: RRR and no murmur Resp: CTA B/L and unlabored GI: BS positive and NT, ND Extremity:  Pulses positive and No Edema. Right foot Skin:   Intact and Wound C/D/I  Neuro: Flat, Cranial Nerve II-XII normal, Abnormal Motor 4/5 in RUE, 3- R HF, 4/5 R KE, ADF, 5/5 LUE, 3/5 L HF, Abnormal FMC Ataxic/ dec FMC and Dysarthric Musc/Skel:  Swelling Left thigh no erythema. Leg sensitive still to touch but improved Gen NAD Psych: calm this am   Assessment/Plan: 1. 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 Physiatrist is providing close team supervision and 24 hour management of active medical problems listed below. Physiatrist and rehab team continue to assess barriers to discharge/monitor patient progress toward functional and medical goals. FIM: FIM - Bathing Bathing Steps Patient Completed: Chest;Right Arm;Left Arm;Front perineal area;Right upper  leg;Left upper leg;Abdomen;Right lower leg (including foot) Bathing: 4: Min-Patient completes 8-9 60f 10 parts or 75+ percent  FIM - Upper Body Dressing/Undressing Upper body dressing/undressing steps patient completed: Thread/unthread right bra strap;Thread/unthread left bra strap;Hook/unhook bra;Thread/unthread right sleeve of pullover shirt/dresss;Thread/unthread left sleeve of pullover shirt/dress;Put head through opening of pull over shirt/dress;Pull shirt over trunk Upper body dressing/undressing: 0: Wears gown/pajamas-no public clothing FIM - Lower Body Dressing/Undressing Lower body dressing/undressing steps patient completed: Thread/unthread right pants leg;Thread/unthread left pants leg Lower body dressing/undressing: 0: Wears gown/pajamas-no public clothing  FIM - Toileting Toileting steps completed by patient: Adjust clothing prior to toileting;Adjust clothing after toileting Toileting Assistive Devices: Grab bar or rail for support Toileting: 3: Mod-Patient completed 2 of 3 steps  FIM - Radio producer Devices: Environmental consultant;Bedside commode Toilet Transfers: 0-Activity did not occur  FIM - Control and instrumentation engineer Devices: Copy: 0: Activity did not occur  FIM - Locomotion: Wheelchair Distance: 200 Locomotion: Wheelchair: 0: Activity did not occur FIM - Locomotion: Ambulation Locomotion: Ambulation Assistive Devices: Administrator Ambulation/Gait Assistance: 5: Supervision;4: Min guard Locomotion: Ambulation: 0: Activity did not occur  Comprehension Comprehension Mode: Auditory Comprehension: 5-Follows basic conversation/direction: With extra time/assistive device  Expression Expression Mode: Verbal Expression Assistive Devices: 6-Communication board Expression: 3-Expresses basic 50 - 74% of the time/requires cueing 25 - 50% of the time. Needs to repeat parts of sentences.  Social  Interaction Social Interaction Mode: Asleep Social Interaction: 5-Interacts appropriately 90% of the time - Needs monitoring or encouragement for participation or interaction.  Problem Solving Problem Solving: 5-Solves basic 90% of the time/requires cueing < 10% of the time  Memory Memory: 4-Recognizes or recalls 75 -  89% of the time/requires cueing 10 - 24% of the time  Medical Problem List and Plan:  1. 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  2. LLE DVT/Anticoagulation: Pharmaceutical: Xarelto (decreased)  -s/p IVCF placement yesterday  -if HGB stable and clinically doing well consider adding back ASA on Monday---plavix at a later date potentially 3. Pain Management:  -oxycontin stopped due to lethargy  -continue oxycodone prn. Due to hearing impairment and aphasia , has difficulty communicating need for prn meds -anxiety is a major issue 4. Mood: Continue cymbalta for pain as well as mood stabilization. Team to provide ego support and encouragement to help with participation. LCSW to follow for evaluation and support.  5. Neuropsych: This patient is not capable of making decisions on her own behalf.  6. Skin/Wound Care: wound clean 7. Fluids/Electrolytes/Nutrition: Monitor intake for adequate hydration and nutritional status.     8. HTN: Will monitor every 8 hours. Continue Coreg. Hold Altace given hypotension.  9. CAD: Monitor for symptoms with recommendations to add nitrate if needed. Continue coreg bid, Altace and Lipitor.  10. Recent L-MCA infarct residual mild R HP: on no meds at present.  11. Hypokalemia: resolved past supplementation ----labs ok  12. Leucocytosis: decreased to 13k today. Temp better   -cultures negative   -off empiric cipro    13. Acute blood loss anemia:    -no further BRBPR  -holding xarelto (15mg  currently) for now  -colonoscopy with rectal ulcers and internal hemorrhoids---likely source of bleed before  - IVCF per IR  -hgb up to  8.9  -GI apparently resumed xarelto last night---will observe for tolerance (keep stool soft)  LOS (Days) 14 A FACE TO FACE EVALUATION WAS PERFORMED  Durrell Barajas T 05/22/2014, 8:05 AM

## 2014-05-22 NOTE — Progress Notes (Signed)
Physical Therapy Session Note  Patient Details  Name: Traci Mclaughlin MRN: [id number] Date of Birth: [birth year]  Today's Date: 05/22/2014 PT Individual Time: 0800-0900 PT Individual Time Calculation (min): 60 min  Session 2 Time: 4166-0630 Time Calculation (min): 60 min  Short Term Goals: Week 2:  PT Short Term Goal 1 (Week 2): =LTGs due to ELOS  Skilled Therapeutic Interventions/Progress Updates:    Session 1: Pt received seated in w/c, agreeable to participate in therapy after finishing grooming tasks. Pt propelled w/c around room, dressed LB w/ set up assist and w/ therapist steadying RW for pt to stand and pull up pants, dressed UB and performed hygiene tasks w/ set up assist while seated in w/c for dynamic sitting balance. Pt propelled w/c 100' to rehab gym w/ supervision and lots of extra time. Transferred w/c>mat table SPT w/ RW and close S, noted pt w/ difficulty clearing foot and used foot pivoting technique without lifting foot from floor. Moved sit>supine w/ MinA for managing RLE into bed. Performed 2x5 single leg bridges w/ RLE, then x10 isometric hip extension w/ LLE pushing into table. In standing pt performed x10 hip extension w/ 3 second hold. Pt educated on importance of hip extension for eventual prosthesis fitting, pt nodded understanding. Pt ambulated 9' w/ RW, very fatigued after just a few feet. Pt performed 5' on UBE L 1.5 for general cardiovascular endurance. Pt transported back to room via w/c for time management. Pt left seated in w/c w/ all needs within reach. RN notified of pt's position.    Session 2: Pt received seated in w/c, agreeable to participate in therapy. Propelled w/c 100' to rehab gym w/ supervision at very slow speed for overall cardiovascular endurance. SPT w/ RW w/c > mat table w/ supervision. 2x10 LAQ, marches, and heel raises seated at EOB w/ 2# ankle weight on RLE for overall strengthening. Gait training w/ use of colored tape on floor w/ cues to  clear foot over tape in order to increase foot clearance during gait. Gave cues to push through arms and lift foot instead of hopping to increase gait efficiency. Pt required frequent rest breaks due to decreased activity tolerance. Pt ambulated 25' w/ RW and supervision before requesting to sit in wheelchair. Pt propelled remaining distance to room w/ supervision and lots of extra time. Pt agreed to stay up in w/c. Pt left seated in w/c w/ QRB on and all needs within reach.  Pt much more participatory and in better spirits with therapy today vs past days. Pt continues to be limited primarily by decreased endurance, anxiety/fear w/ any LLE movement. During PM session pt able to teach back reasons for wearing shrinker on L residual limb, but she still requires a lot of encouragement to use shrinker and move L residual limb to extension in order to avoid flexion contracture. Pt will benefit from continued PT at inpatient rehab level in order to reduce caregiver burden and maximize functional recovery.   Therapy Documentation Precautions:  Precautions Precautions: Fall Precaution Comments: LLE AKA-HOH- needs written communication Restrictions Weight Bearing Restrictions: Yes LLE Weight Bearing: Non weight bearing Other Position/Activity Restrictions: wears Darco sandal to unweight L toes General:   Vital Signs: Therapy Vitals Temp: 98.3 F (36.8 C) Temp Source: Oral Pulse Rate: 99 Resp: 20 BP: 148/85 mmHg Patient Position (if appropriate): Lying Oxygen Therapy SpO2: 96 % O2 Device: Not Delivered Pain: Pain Assessment Pain Assessment: No/denies pain Mobility:   Locomotion :    Trunk/Postural Assessment :  Balance:   Exercises:   Other Treatments:    See FIM for current functional status  Therapy/Group: Individual Therapy  Rada Hay Rada Hay, PT, DPT 05/22/2014, 7:34 AM

## 2014-05-22 NOTE — Progress Notes (Signed)
Occupational Therapy Session Note  Patient Details  Name: Traci Mclaughlin MRN: [id number] Date of Birth: 14-Sep-1950  Today's Date: 05/22/2014 OT Individual Time: 1300-1400 OT Individual Time Calculation (min): 60 min   Skilled Therapeutic Interventions/Progress Updates:    Pt performed tub/shower transfers using the RW and tub bench.  She was able to ambulate approximately 3 feet from the bathroom door opening to the shower bench with min assist.  Noted pt using wide bariatric RW with decreased ability to advance her RLE with adequate hop.  Transitioned in and out of the tub with min assist and mod instructional cueing for technique.  Had pt transfer back to the wheelchair with min assist.  As pt fatigues with hopping she transitions to heel to toe method.  Rolled pt down to the gym for the second half of therapy.  Utilized regular walker for functional transfers for hopping to the therapy mat.  Pt able to move the walker more easily but it did not improve her ability to move her RLE.  Encouraged pt to try and maintain elbow extension while attempting to hop.  Pt with increased fatigue with minimal activity.  Rolled her back to the room to conclude session.  Utilized Archivist throughout session for communication.   Therapy Documentation Precautions:  Precautions Precautions: Fall Precaution Comments: LLE AKA-HOH- needs written communication Restrictions Weight Bearing Restrictions: Yes LLE Weight Bearing: Non weight bearing Other Position/Activity Restrictions: wears Darco sandal to unweight L toes  Pain: Pain Assessment Pain Assessment: No/denies pain ADL: See FIM for current functional status  Therapy/Group: Individual Therapy  Tavarius Grewe OTR/L 05/22/2014, 4:17 PM

## 2014-05-22 NOTE — Progress Notes (Signed)
Assisted patient to bathroom.  Small amount of bright red, mucous blood/stool noted in toilet.  Will continue to monitor.  Brita Romp, RN

## 2014-05-22 NOTE — Progress Notes (Signed)
[facility] PROGRESS NOTE Subjective patient eating without problems had small amount of liquid stool with blood  Objective: Vital signs in last 24 hours: Temp:  [98.3 F (36.8 C)-98.7 F (37.1 C)] 98.7 F (37.1 C) (10/27 1300) Pulse Rate:  [91-99] 91 (10/27 1300) Resp:  [18-20] 18 (10/27 1300) BP: (137-148)/(85-90) 137/90 mmHg (10/27 1300) SpO2:  [96 %-99 %] 99 % (10/27 1300) Last BM Date: 05/21/14  Intake/Output from previous day: 10/26 0701 - 10/27 0700 In: 1220 [P.O.:220; I.V.:1000] Out: 200 [Urine:200] Intake/Output this shift: Total I/O In: 120 [P.O.:120] Out: -    Lab Results:  Recent Labs  05/20/14 1205 05/21/14 0500 05/22/14 0700  WBC 9.7 8.3 8.1  HGB 8.8* 8.7* 8.9*  HCT 27.0* 26.9* 27.3*  PLT 365 371 353   BMET No results found for this basename: NA, K, CL, CO2, CREATININE,  in the last 72 hours LFT No results found for this basename: PROT, AST, ALT, ALKPHOS, BILITOT, BILIDIR, IBILI,  in the last 72 hours PT/INR No results found for this basename: LABPROT, INR,  in the last 72 hours PANCREAS No results found for this basename: LIPASE,  in the last 72 hours       Studies/Results: No results found.  Medications: I have reviewed the patient's current medications.  Assessment/Plan: 1. Rectal bleeding. Due to stercoral ulcer due to chronic constipation. Will check: path just to be sure. Would keep Miralax 1 to 2 times daily to keep her stool soft. I have gone over all of this with her and written this down on her board and she indicates an understanding. We will plan on seeing her back on an as needed basis.   Traci Mclaughlin JR,Norva Bowe L 05/22/2014, 5:48 PM

## 2014-05-22 NOTE — Progress Notes (Signed)
Patient with superficial breakdown on sacral area as well as two small abraded ares on right buttocks. Appear to be shear injuries. Patient also has had poor po intake requiring enforcement of need for nutritional supplement. Will order air mattress overlay and Sween cream. WOC for additional input.

## 2014-05-22 NOTE — Progress Notes (Signed)
Occupational Therapy Session Note  Patient Details  Name: Traci Mclaughlin MRN: [id number] Date of Birth: 07-14-1951  Today's Date: 05/22/2014 OT Individual Time: 1100-1200 OT Individual Time Calculation (min): 60 min    Short Term Goals: Week 2:  OT Short Term Goal 1 (Week 2): STGs = LTGs  Skilled Therapeutic Interventions/Progress Updates:    Pt seen for ADL retraining with focus on functional transfers, standing balance, safety awareness, problem solving, and activity toleranace. Pt received supine in bed requiring mod cues for participation. Pt completed stand pivot transfer bed>w/c with SBA using RW. Pt completed bathing and dressing from w/c level. Required max cues for locking w/c before sit<>stand and when repositioning. Utilized bench to prop RLE on to increase independence with LB self-care, with pt requiring min assist to don sock. Pt required SBA for standing balance during hygiene and clothing management. Pt completed stand pivot transfer w/c<>toilet with SBA and use of RW. Pt required min assist for thoroughness during hygiene and increased time for clothing management. Pt agreeable to remain in w/c for lunch. Pt left with all needs in reach.   Therapy Documentation Precautions:  Precautions Precautions: Fall Precaution Comments: LLE AKA-HOH- needs written communication Restrictions Weight Bearing Restrictions: Yes LLE Weight Bearing: Non weight bearing (to stump) Other Position/Activity Restrictions: wears Darco sandal to unweight L toes General:   Vital Signs:   Pain: Pain Assessment Pain Assessment: No/denies pain Pain Score: 0-No pain  See FIM for current functional status  Therapy/Group: Individual Therapy  Duayne Cal 05/22/2014, 12:06 PM

## 2014-05-23 ENCOUNTER — Inpatient Hospital Stay (HOSPITAL_COMMUNITY): Payer: Medicare Other | Admitting: Physical Therapy

## 2014-05-23 ENCOUNTER — Inpatient Hospital Stay (HOSPITAL_COMMUNITY): Payer: Medicare Other

## 2014-05-23 DIAGNOSIS — K625 Hemorrhage of anus and rectum: Secondary | ICD-10-CM | POA: Diagnosis not present

## 2014-05-23 DIAGNOSIS — Z86718 Personal history of other venous thrombosis and embolism: Secondary | ICD-10-CM

## 2014-05-23 DIAGNOSIS — Z9111 Patient's noncompliance with dietary regimen: Secondary | ICD-10-CM

## 2014-05-23 DIAGNOSIS — Z91199 Patient's noncompliance with other medical treatment and regimen due to unspecified reason: Secondary | ICD-10-CM

## 2014-05-23 LAB — CBC WITH DIFFERENTIAL/PLATELET
Basophils Absolute: 0 10*3/uL (ref 0.0–0.1)
Basophils Relative: 0 % (ref 0–1)
EOS ABS: 0.2 10*3/uL (ref 0.0–0.7)
EOS PCT: 3 % (ref 0–5)
HCT: 27.9 % — ABNORMAL LOW (ref 36.0–46.0)
Hemoglobin: 9.1 g/dL — ABNORMAL LOW (ref 12.0–15.0)
LYMPHS ABS: 1.4 10*3/uL (ref 0.7–4.0)
Lymphocytes Relative: 17 % (ref 12–46)
MCH: 31.4 pg (ref 26.0–34.0)
MCHC: 32.6 g/dL (ref 30.0–36.0)
MCV: 96.2 fL (ref 78.0–100.0)
Monocytes Absolute: 0.7 10*3/uL (ref 0.1–1.0)
Monocytes Relative: 9 % (ref 3–12)
Neutro Abs: 5.7 10*3/uL (ref 1.7–7.7)
Neutrophils Relative %: 71 % (ref 43–77)
Platelets: 379 10*3/uL (ref 150–400)
RBC: 2.9 MIL/uL — AB (ref 3.87–5.11)
RDW: 13.7 % (ref 11.5–15.5)
WBC: 8 10*3/uL (ref 4.0–10.5)

## 2014-05-23 NOTE — Progress Notes (Signed)
Occupational Therapy Session Note  Patient Details  Name: Traci Mclaughlin MRN: [id number] Date of Birth: 1951-04-25  Today's Date: 05/23/2014 OT Individual Time: 1100-1200 OT Individual Time Calculation (min): 60 min    Short Term Goals: Week 2:  OT Short Term Goal 1 (Week 2): STGs = LTGs  Skilled Therapeutic Interventions/Progress Updates:    Pt seen for ADL retraining with focus on sit<>stand, standing balance, and activity tolerance. Pt received sitting in w/c agreeable to therapy session this AM. Completed bathing at sink with increased time d/t fatigue and supervision for sit<>stand. Pt emotionally labile throughout session with therapist providing emotional support. Introduced AE to pt as she declined using stool to assist. Pt with good carryover of AE with increased time. Pt required min assist for standing balance during clothing management around waist. Pt completed sit<>stand 5x with RW at supervision level. Pt left sitting in w/c with all needs in reach.   Therapy Documentation Precautions:  Precautions Precautions: Fall Precaution Comments: LLE AKA-HOH- needs written communication Restrictions Weight Bearing Restrictions: Yes LLE Weight Bearing: Non weight bearing (to stump) Other Position/Activity Restrictions: wears Darco sandal to unweight L toes General:   Vital Signs:   Pain: No report of pain during therapy session.   See FIM for current functional status  Therapy/Group: Individual Therapy  Duayne Cal 05/23/2014, 12:10 PM

## 2014-05-23 NOTE — Progress Notes (Addendum)
Subjective/Complaints:   Slept pretty well. 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 rate 18, weight 73.5 kg (162 lb 0.6 oz), SpO2 99.00%. No results found. Results for orders placed during the hospital encounter of 05/08/14 (from the past 72 hour(s))  CBC WITH DIFFERENTIAL     Status: Abnormal   Collection Time    05/20/14 12:05 PM      Result Value Ref Range   WBC 9.7  4.0 - 10.5 K/uL   RBC 2.85 (*) 3.87 - 5.11 MIL/uL   Hemoglobin 8.8 (*) 12.0 - 15.0 g/dL   HCT 27.0 (*) 36.0 - 46.0 %   MCV 94.7  78.0 - 100.0 fL   MCH 30.9  26.0 - 34.0 pg   MCHC 32.6  30.0 - 36.0 g/dL   RDW 13.5  11.5 - 15.5 %   Platelets 365  150 - 400 K/uL   Neutrophils Relative % 76  43 - 77 %   Neutro Abs 7.4  1.7 - 7.7 K/uL   Lymphocytes Relative 14  12 - 46 %   Lymphs Abs 1.4  0.7 - 4.0 K/uL   Monocytes Relative 7  3 - 12 %   Monocytes Absolute 0.7  0.1 - 1.0 K/uL   Eosinophils Relative 3  0 - 5 %   Eosinophils Absolute 0.3  0.0 - 0.7 K/uL   Basophils Relative 0  0 - 1 %   Basophils Absolute 0.0  0.0 - 0.1 K/uL  CBC WITH DIFFERENTIAL     Status: Abnormal   Collection Time    05/21/14  5:00 AM      Result Value Ref Range   WBC 8.3  4.0 - 10.5 K/uL   RBC 2.78 (*) 3.87 - 5.11 MIL/uL   Hemoglobin 8.7 (*) 12.0 - 15.0 g/dL   HCT 26.9 (*) 36.0 - 46.0 %   MCV 96.8  78.0 - 100.0 fL   MCH 31.3  26.0 - 34.0 pg   MCHC 32.3  30.0 - 36.0 g/dL   RDW 13.5  11.5 - 15.5 %   Platelets 371  150 - 400 K/uL   Neutrophils Relative % 73  43 - 77 %   Neutro Abs 6.0  1.7 - 7.7 K/uL   Lymphocytes Relative 18  12 - 46 %   Lymphs Abs 1.5  0.7 - 4.0 K/uL   Monocytes Relative 7  3 - 12 %   Monocytes Absolute 0.6  0.1 - 1.0 K/uL   Eosinophils Relative 2  0 - 5 %   Eosinophils Absolute 0.2  0.0 - 0.7 K/uL   Basophils Relative 0  0 - 1 %    Basophils Absolute 0.0  0.0 - 0.1 K/uL  CBC WITH DIFFERENTIAL     Status: Abnormal   Collection Time    05/22/14  7:00 AM      Result Value Ref Range   WBC 8.1  4.0 - 10.5 K/uL   RBC 2.85 (*) 3.87 - 5.11 MIL/uL   Hemoglobin 8.9 (*) 12.0 - 15.0 g/dL   HCT 27.3 (*) 36.0 - 46.0 %   MCV 95.8  78.0 - 100.0 fL   MCH 31.2  26.0 - 34.0 pg   MCHC 32.6  30.0 - 36.0 g/dL   RDW 13.5  11.5 - 15.5 %   Platelets 353  150 - 400 K/uL   Neutrophils Relative % 72  43 - 77 %   Neutro Abs 5.7  1.7 - 7.7 K/uL   Lymphocytes Relative 17  12 - 46 %   Lymphs Abs 1.4  0.7 - 4.0 K/uL   Monocytes Relative 9  3 - 12 %   Monocytes Absolute 0.8  0.1 - 1.0 K/uL   Eosinophils Relative 2  0 - 5 %   Eosinophils Absolute 0.2  0.0 - 0.7 K/uL   Basophils Relative 0  0 - 1 %   Basophils Absolute 0.0  0.0 - 0.1 K/uL      HEENT: poor dentition Cardio: RRR and no murmur Resp: CTA B/L and unlabored GI: BS positive and NT, ND Extremity:  Pulses positive and No Edema. Right foot Skin:   Intact and Wound C/D/I  Neuro: Flat, Cranial Nerve II-XII normal, Abnormal Motor 4/5 in RUE, 3- R HF, 4/5 R KE, ADF, 5/5 LUE, 3/5 L HF, Abnormal FMC Ataxic/ dec FMC and Dysarthric Musc/Skel:  Swelling Left thigh no erythema. Leg sensitive still to touch but improved Gen NAD Psych: calm again this am   Assessment/Plan: 1. 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 Physiatrist is providing close team supervision and 24 hour management of active medical problems listed below. Physiatrist and rehab team continue to assess barriers to discharge/monitor patient progress toward functional and medical goals. FIM: FIM - Bathing Bathing Steps Patient Completed: Chest;Right Arm;Left Arm;Front perineal area;Right upper leg;Left upper leg;Abdomen;Right lower leg (including foot);Buttocks Bathing: 5: Supervision: Safety issues/verbal  cues  FIM - Upper Body Dressing/Undressing Upper body dressing/undressing steps patient completed: Thread/unthread right bra strap;Thread/unthread left bra strap;Hook/unhook bra;Thread/unthread right sleeve of pullover shirt/dresss;Thread/unthread left sleeve of pullover shirt/dress;Put head through opening of pull over shirt/dress;Pull shirt over trunk Upper body dressing/undressing: 5: Set-up assist to: Obtain clothing/put away FIM - Lower Body Dressing/Undressing Lower body dressing/undressing steps patient completed: Thread/unthread right pants leg;Thread/unthread left pants leg;Thread/unthread right underwear leg;Thread/unthread left underwear leg;Pull underwear up/down;Pull pants up/down Lower body dressing/undressing: 4: Min-Patient completed 75 plus % of tasks  FIM - Toileting Toileting steps completed by patient: Adjust clothing prior to toileting;Adjust clothing after toileting;Performs perineal hygiene Toileting Assistive Devices: Grab bar or rail for support Toileting: 4: Steadying assist  FIM - Radio producer Devices: Environmental consultant;Bedside commode Toilet Transfers: 5-To toilet/BSC: Supervision (verbal cues/safety issues);5-From toilet/BSC: Supervision (verbal cues/safety issues)  FIM - Bed/Chair Transfer Bed/Chair Transfer Assistive Devices: Adult nurse Transfer: 4: Sit > Supine: Min A (steadying pt. > 75%/lift 1 leg);5: Supine > Sit: Supervision (verbal cues/safety issues);5: Bed > Chair or W/C: Supervision (verbal cues/safety issues);5: Chair or W/C > Bed: Supervision (verbal cues/safety issues)  FIM - Locomotion: Wheelchair Distance: 200 Locomotion: Wheelchair: 2: Travels 50 - 149 ft with minimal assistance (Pt.>75%) FIM - Locomotion: Ambulation Locomotion: Ambulation Assistive Devices: Administrator Ambulation/Gait Assistance: 5: Supervision Locomotion: Ambulation: 1: Travels less than 50 ft with supervision/safety  issues  Comprehension Comprehension Mode: Auditory;Visual Comprehension: 5-Follows basic conversation/direction: With no assist  Expression Expression Mode: Verbal Expression Assistive Devices: 6-Communication board Expression: 4-Expresses basic 75 - 89% of the time/requires cueing 10 - 24% of the time. Needs helper to occlude trach/needs to repeat words.  Social Interaction Social Interaction Mode: Asleep Social Interaction: 5-Interacts appropriately 90% of the time - Needs monitoring or encouragement for participation or interaction.  Problem Solving Problem  Solving: 4-Solves basic 75 - 89% of the time/requires cueing 10 - 24% of the time  Memory Memory: 4-Recognizes or recalls 75 - 89% of the time/requires cueing 10 - 24% of the time  Medical Problem List and Plan:  1. 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  2. LLE DVT/Anticoagulation: Pharmaceutical: xarelto held  -s/p IVCF in place  -ASA to resume today (no xarelto)---plavix to be added at a later date potentially depending on whether there are any bleeding complications 3. Pain Management:  -oxycontin stopped due to lethargy  -continue oxycodone prn. Due to hearing impairment and aphasia , has difficulty communicating need for prn meds -anxiety is a major issue 4. Mood: Continue cymbalta for pain as well as mood stabilization. Team to provide ego support and encouragement to help with participation. LCSW to follow for evaluation and support.  5. Neuropsych: This patient is not capable of making decisions on her own behalf.  6. Skin/Wound Care: wound clean 7. Fluids/Electrolytes/Nutrition: Monitor intake for adequate hydration and nutritional status.     8. HTN: Will monitor every 8 hours. Continue Coreg. Hold Altace given hypotension.  9. CAD: Monitor for symptoms with recommendations to add nitrate if needed. Continue coreg bid, Altace and Lipitor.  10. Recent L-MCA infarct residual mild R HP: on no meds at  present.  11. Hypokalemia: resolved past supplementation ----labs ok  12. Leucocytosis: decreased to 13k today. Temp better   -cultures negative   -off empiric cipro    13. Acute blood loss anemia:    -no further BRBPR  -GI resumed xarelto (15mg  currently) Monday evening and pt started to bleed again (now on hold again)  -colonoscopy with rectal ulcers and internal hemorrhoids---likely source of bleed with a/c   -trying to keep stool soft  -hgb up to 9.1 today    LOS (Days) 15 A FACE TO FACE EVALUATION WAS PERFORMED  Jaquala Fuller T 05/23/2014, 6:47 AM

## 2014-05-23 NOTE — Patient Care Conference (Addendum)
Inpatient RehabilitationTeam Conference and Plan of Care Update Date: 05/22/2014   Time: 2:00 PM    Patient Name: Traci Mclaughlin      Medical Record Number: [id number]  Date of Birth: [birth year] Sex: Female         Room/Bed: 4M02C/4M02C-01 Payor Info: Payor: [employer] MEDICARE / Plan: AARP MEDICARE COMPLETE / Product Type: *No Product type* /    Admitting Diagnosis: l aka deaf  Admit Date/Time:  05/08/2014  5:36 PM Admission Comments: No comment available   Primary Diagnosis:  Unilateral complete AKA Principal Problem: Unilateral complete AKA  Patient Active Problem List   Diagnosis Date Noted  . Rectal bleeding--due to rectal ulcers/constipation 05/23/2014  . Noncompliance with treatment plan 05/23/2014  . Unilateral complete AKA 05/08/2014  . CAD (coronary atherosclerotic disease) 05/02/2014  . Preop cardiovascular exam 05/02/2014  . RBBB (right bundle branch block) 05/02/2014  . 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 History of DVT (deep vein thrombosis) 05/02/2014  . Ulcer of left lower leg 04/30/2014  . Leukocytosis 04/26/2014  . Pain in joint, ankle and foot 04/19/2014  . Depression 04/19/2014  . PAD (peripheral artery disease) 04/10/2014  . Left leg pain 03/31/2014  . Elevated troponin 03/11/2014  . 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 Right hemiplegia 03/07/2014  . Dyslipidemia 03/07/2014  . Stroke 03/05/2014  . CKD (chronic kidney disease) stage 3, GFR 30-59 ml/min 03/05/2014  . Hypertension 03/05/2014  . CVA (cerebral infarction) 03/05/2014  . Elevation of cardiac enzymes 07/06/2011  . Cervical cancer 07/06/2011  . Viral gastroenteritis 07/06/2011  . Acute kidney injury 07/06/2011    Expected Discharge Date: Expected Discharge Date: 05/25/14  Team Members Present: Physician leading conference: Dr. Alger Simons Social Worker Present: Alfonse Alpers, LCSW Nurse Present: Elliot Cousin, RN PT Present: Raylene Everts, PT;Jess  Anastasia Fiedler, PT OT Present: Chrys Racer, OT SLP Present: Weston Anna, SLP Other (Discipline and Name): Danne Baxter, RN [facility]) PPS Coordinator present : Daiva Nakayama, RN, CRRN     Current Status/Progress Goal Weekly Team Focus  Medical   colonsocpy with ulcerations and hemorrhoids  improve pain and paricipation  gi work up, anticoagualtion   Bowel/Bladder   Continent of bowel and bladder; LBM 10/27; had colonoscopy yesterday- ulcers noted  mod I  Up to toilet/BSC to void; monitor and treat for constipation   Swallow/Nutrition/ Hydration             ADL's   Min A overalll (variable, up to Mod A d/t mood)  Supervision overall for B & D, xfers  Participation!  standing balance, transfers, strengthening   Mobility   S/set up for transfers, ambulation to 10-15', S for w/c propulsion  S for ambulation up to 50', S for w/c propulsion, mod (I) for bed<>w/c transfers  family training, participation, pain management, BLE strength/ROM   Communication             Safety/Cognition/ Behavioral Observations  Tearful at times; refuses shrinker; memory deficits  Mod I      Pain   Left stump pain- controlled with po pain meds; refuses to wear shrinker or ace due to pain  < 3  Assess and treat for pain q shift ans prn   Skin   Unstageable to sacrum; incision to stump intact with staples; refuses shrinker or acewrap; refuses prevalon boot at times  Remain free from futher breakdown or infection with mod assist  Assess skin q shift and prn    Rehab Goals  Patient on target to meet rehab goals: Yes Rehab Goals Revised: None *See Care Plan and progress notes for long and short-term goals.  Barriers to Discharge: medical issues. family participation and planning    Possible Resolutions to Barriers:  setting deadlines with family, stop xarelto    Discharge Planning/Teaching Needs:  Pt plans to return home with her dtr, Traci Mclaughlin, who will provide supervision and some minimal assistance.  Family  needs to come in for family education, especially to learn how to bump w/c up 2 stairs until ramp can be installed.  Dtr plans to come 05-24-14 for family education with her boyfriend.   Team Discussion:  Pt had a colonoscopy and was found to have a GI bleed due to constipation, ulcers, hemorrhoids.  Pt is doing better emotionally.  Pt has an unstagable wound on her sacrum and WOC RN is following.  Pt is participating better with therapies.  Revisions to Treatment Plan:  None   Continued Need for Acute Rehabilitation Level of Care: The patient requires daily medical management by a physician with specialized training in physical medicine and rehabilitation for the following conditions: Daily direction of a multidisciplinary physical rehabilitation program to ensure safe treatment while eliciting the highest outcome that is of practical value to the patient.: Yes Daily medical management of patient stability for increased activity during participation in an intensive rehabilitation regime.: Yes Daily analysis of laboratory values and/or radiology reports with any subsequent need for medication adjustment of medical intervention for : Other;Post surgical problems;Pulmonary problems;Neurological problems  Traci Mclaughlin, Traci Mclaughlin 05/23/2014, 1:55 PM

## 2014-05-23 NOTE — Progress Notes (Signed)
Social Work Patient ID: Traci Mclaughlin, female   DOB: [birth year], 63 y.o.   MRN: [id number]  CSW met with pt and spoke with her dtr via telephone to update them on team conference discussion and new d/c date of 05-25-14.  Pt is pleased to be going home and dtr is prepared to take her home on Friday.  Dtr and her boyfriend to come for family education tomorrow.  CSW will resume pt's home health with [place] with d/c being this Friday.  CSW will continue to follow and assist at needed.

## 2014-05-23 NOTE — Progress Notes (Signed)
Physical Therapy Session Note  Patient Details  Name: Traci Mclaughlin MRN: [id number] Date of Birth: 11/06/1950  Today's Date: 05/23/2014 PT Individual Time: 0830-0930 Treatment Session 2: 1400-1500 PT Individual Time Calculation (min): 60 min Treatment Session 2: 60 minutes  Short Term Goals: Week 2:  PT Short Term Goal 1 (Week 2): =LTGs due to ELOS  Skilled Therapeutic Interventions/Progress Updates:  Whiteboard used for communication throughout treatment.     Gait Training: PT instructs pt in ambulation x 50' with bari RW req SBA and encouragement for this distance. No LOB noted.   W/C management: PT instructs pt in w/c propulsion with B UEs on level surface x 150' req increased time and encouragement for this distance.  PT instructs pt in ascending/descending ramp in w/c with B UEs x 3 reps, initially req mod A to ascend, progressing to CGA, initially req min A to descent progressing to SBA.   Therapeutic Activity: PT instructs pt in sit to stand throughout session with bari RW req SBA, in transfer w/c to/from mat with bari RW req SBA, and in mat mobility supine to/from sit mod I. Pt demonstrates ability to scoot laterally in long sit on mat mod I, but with increased time.   Therapeutic Exercise: PT instructs pt in prone time on mat for improved L hip extension ROM and desensitization. Pt unable to get fully into prone.   Treatment Session 2: Therapeutic Activity: PT gives pt a regular sock and slip on shoe and instructs her to doff the non-skid sock and put these on, which she does with significantly increased amount of time.   Neuromuscular Reeducation: PT instructs pt in TUG and she scores 46 seconds, 46 seconds, and 44 seconds with RW on each of the 3 trials.  PT instructs pt in mirror visual feedback therapy with lower extremity mirror box with the following exercises: ankle pumps, hip IR/ER with knee straight, heel slides, supine hip abduction/adduction, hip ER/IR in  hook lie x 20 reps each.   Therapeutic Exercise: PT instructs pt to roll into prone fully - pt initially apprehensive, but then able to get fully in prone. PT then instructs pt in prone L leg hip extension 3 x 10 reps.   Pt demonstrates improved participation, today. Mirror therapy was effective in abolishing pt's phantom limb pain after 10 minutes of it, which is about half the time required from the previous mirror therapy session. Pt demonstrates ability to roll fully into prone and tolerates staying there a total of 15 minutes, including L hip extension exercises. PT instructs pt to do mirror therapy daily at home and lie on her stomach daily, at home. Continue per PT POC.    Therapy Documentation Precautions:  Precautions Precautions: Fall Precaution Comments: LLE AKA-HOH- needs written communication Restrictions Weight Bearing Restrictions: Yes LLE Weight Bearing: Non weight bearing Other Position/Activity Restrictions: wears Darco sandal to unweight L toes Pain: Pain Assessment Pain Assessment: No/denies pain Pain Score: 5  Pain Type: Acute pain Pain Location: Leg Pain Orientation: Left Pain Descriptors / Indicators: Aching Pain Frequency: Intermittent Pain Onset: On-going Pain Intervention(s): Medication (See eMAR) Treatment Session 2: Pt c/o 8/10 L phantom leg pain; PT uses mirror therapy to reduce pt's pain.   Balance: Balance Balance Assessed: Yes Standardized Balance Assessment Standardized Balance Assessment: Timed Up and Go Test Timed Up and Go Test TUG: Normal TUG Normal TUG (seconds): 44  See FIM for current functional status  Therapy/Group: Individual Therapy  Deundre Thong M 05/23/2014, 8:37 AM

## 2014-05-24 ENCOUNTER — Inpatient Hospital Stay (HOSPITAL_COMMUNITY): Payer: Medicare Other

## 2014-05-24 ENCOUNTER — Inpatient Hospital Stay (HOSPITAL_COMMUNITY): Payer: Medicare Other | Admitting: Physical Therapy

## 2014-05-24 LAB — CBC WITH DIFFERENTIAL/PLATELET
BASOS ABS: 0 10*3/uL (ref 0.0–0.1)
Basophils Relative: 0 % (ref 0–1)
EOS ABS: 0.3 10*3/uL (ref 0.0–0.7)
Eosinophils Relative: 5 % (ref 0–5)
HCT: 27.6 % — ABNORMAL LOW (ref 36.0–46.0)
Hemoglobin: 8.9 g/dL — ABNORMAL LOW (ref 12.0–15.0)
LYMPHS PCT: 23 % (ref 12–46)
Lymphs Abs: 1.7 10*3/uL (ref 0.7–4.0)
MCH: 31.2 pg (ref 26.0–34.0)
MCHC: 32.2 g/dL (ref 30.0–36.0)
MCV: 96.8 fL (ref 78.0–100.0)
Monocytes Absolute: 0.7 10*3/uL (ref 0.1–1.0)
Monocytes Relative: 10 % (ref 3–12)
NEUTROS PCT: 62 % (ref 43–77)
Neutro Abs: 4.5 10*3/uL (ref 1.7–7.7)
Platelets: 368 10*3/uL (ref 150–400)
RBC: 2.85 MIL/uL — ABNORMAL LOW (ref 3.87–5.11)
RDW: 13.9 % (ref 11.5–15.5)
WBC: 7.2 10*3/uL (ref 4.0–10.5)

## 2014-05-24 MED ORDER — PRO-STAT SUGAR FREE PO LIQD
30.0000 mL | Freq: Three times a day (TID) | ORAL | Status: DC
  Administered 2014-05-24 – 2014-05-25 (×4): 30 mL via ORAL
  Filled 2014-05-24 (×5): qty 30

## 2014-05-24 NOTE — Progress Notes (Signed)
Physical Therapy Discharge Summary  Patient Details  Name: Traci Mclaughlin MRN: [id number] Date of Birth: May 09, 1951  Today's Date: 05/24/2014 PT Individual Time: 1000-1100 Treatment Session 2: 1300-1330 PT Individual Time Calculation (min): 60 min Treatment Session 2: 30 min   Patient has met 7 of 8 long term goals due to improved activity tolerance, improved balance, improved postural control, increased strength, increased range of motion, decreased pain, ability to compensate for deficits, improved awareness and improved coordination.  Patient to discharge at a wheelchair level Supervision.   Patient's care partner is independent to provide the necessary physical assistance at discharge.  Reasons goals not met: Pt's family has no-showed for 2 family training sessions in order to teach them how to bump pt up/down stairs in w/c.   Recommendation:  Patient will benefit from ongoing skilled PT services in home health setting to continue to advance safe functional mobility, address ongoing impairments in balance, activity tolerance, safety with transfers, gait endurance, to prepare for a L prosthetic leg, and minimize fall risk.  Equipment: 18x16 manual w/c  Reasons for discharge: treatment goals met and discharge from hospital  Patient/family agrees with progress made and goals achieved: Yes  Therapeutic Interventions Treatment Session 1: Communication board used to communicate with pt during both treatments.   Treatment Session 1:  Therapeutic Activity: Pt demonstrates mod I bed mobility supine to/from sit on air mattress without rails.  Pt req SBA for sit to stand and stand-pivot transfer with bari RW for safety.  Pt requests to brush teeth and comb hair - does so mod I from w/c level at sink.  Pt gets dressed upper body with set-up on sit edge of bed and req set-up and SBA to get dressed in sit and stand with lower body.  PT instructs pt in car and furniture transfer with bari  RW req SBA for safety and visual cues for technique - 1 LOB that pt self corrects when turning and transferring out of the car.   Gait Training: PT instructs pt in ambulation with RW x 50' - pt req significant encouragement to ambulate this distance, but no LOB and pt only req SBA for safety. Pt demonstrates shuffle step-hop with bari RW.   Treatment Session 2: W/C Management: Pt demonstrates mod I w/c propulsion on level surface x 150' with B UE, but req significantly increased time.   Therapeutic Exercise: PT instructs pt in supine level exercises: L SLR and L side lie hip abduction x 10 reps each.   Pt is d/c at grossly SBA level in w/c and for household ambulation with RW. Pt's family no-showed for 2nd family training session set up in order to teach them how to bump pt up/down stairs in the w/c (Family also no-showed for pt's 1st scheduled family training session for this). PT gave pt written handouts for 3 HEP: prone time (for 10 minutes per day), side lie hip abduction x 10 reps daily, supine hip flexion x 10 reps daily. Pt verbalizes understanding and takes the handouts. PT also gave pt a handout explaining the importance of wearing the shrinker on residual limb in order to reduce swelling (which will reduce pain) and shape the leg for a prosthesis.      PT Discharge Precautions/Restrictions Precautions Precautions: Fall Precaution Comments: LLE AKA-HOH- needs written communication Required Braces or Orthoses: Other Brace/Splint Other Brace/Splint: shrinker to L leg Restrictions Weight Bearing Restrictions: Yes LLE Weight Bearing: Non weight bearing Vital Signs Therapy Vitals Pulse Rate: 77 BP:  127/61 mmHg Patient Position (if appropriate): Sitting Oxygen Therapy SpO2: 99 % O2 Device: Not Delivered Pain Pain Assessment Pain Assessment: 0-10 Pain Score: 9  Pain Type: Surgical pain;Phantom pain Pain Location: Leg Pain Orientation: Left Pain Descriptors / Indicators:  Aching Pain Onset: On-going Pain Intervention(s): Rest;Emotional support Multiple Pain Sites: No Treatment Session 2:Pt denies pain    Vision/Perception  Perception Comments: slight R inattention from prior stroke Praxis Praxis-Other Comments: this is improved with task specific training  Cognition Overall Cognitive Status: History of cognitive impairments - at baseline (from prior CVA) Arousal/Alertness: Awake/alert Orientation Level: Oriented to person;Oriented to time;Oriented to situation;Disoriented to place (said hospital, could not name which one) Attention: Focused;Sustained Focused Attention: Appears intact Sustained Attention: Appears intact Memory: Impaired Memory Impairment: Decreased recall of new information Awareness: Impaired Awareness Impairment: Anticipatory impairment Problem Solving: Impaired Problem Solving Impairment: Functional basic Behaviors: Lability;Poor frustration tolerance Safety/Judgment: Impaired Comments: Pt somewhat impulsive Sensation Sensation Light Touch: Appears Intact Stereognosis: Not tested Hot/Cold: Not tested Proprioception: Appears Intact Coordination Gross Motor Movements are Fluid and Coordinated: No Fine Motor Movements are Fluid and Coordinated: Not tested Coordination and Movement Description: improved coordination, but still stumbly at times with sit to stand Motor  Motor Motor: Motor apraxia Motor - Discharge Observations: in L UE with w/c propulsion up a ramp; improved in w/c propulsion on level surfaces  Mobility Bed Mobility Bed Mobility: Supine to Sit;Sit to Supine Supine to Sit: 6: Modified independent (Device/Increase time) Sit to Supine: 6: Modified independent (Device/Increase time) Transfers Transfers: Yes Sit to Stand: 5: Supervision Sit to Stand Details: Verbal cues for precautions/safety Sit to Stand Details (indicate cue type and reason): hand placement Stand Pivot Transfers: 5: Supervision Stand  Pivot Transfer Details: Verbal cues for precautions/safety Stand Pivot Transfer Details (indicate cue type and reason): hand placement Locomotion  Ambulation Ambulation: Yes Ambulation/Gait Assistance: 5: Supervision Ambulation Distance (Feet): 50 Feet Assistive device: Rolling walker Ambulation/Gait Assistance Details: Verbal cues for precautions/safety Gait Gait: Yes Gait Pattern: Impaired Gait Pattern: Trunk flexed;Shuffle Gait velocity: decreased Stairs / Additional Locomotion Stairs: No Architect: Yes Wheelchair Assistance: 5: Investment banker, operational Details: Verbal cues for Information systems manager: Both upper extremities Wheelchair Parts Management: Supervision/cueing Distance: 150  Trunk/Postural Assessment  Cervical Assessment Cervical Assessment: Within Functional Limits Thoracic Assessment Thoracic Assessment: Within Functional Limits Lumbar Assessment Lumbar Assessment: Within Functional Limits Postural Control Postural Control: Within Functional Limits Protective Responses: appropriate timing Postural Limitations: slight slouched positioning, typical of chronic sitters  Balance Balance Balance Assessed: Yes Standardized Balance Assessment Standardized Balance Assessment: Timed Up and Go Test Timed Up and Go Test TUG: Normal TUG Normal TUG (seconds): 44 Static Sitting Balance Static Sitting - Balance Support: Feet supported;Bilateral upper extremity supported Static Sitting - Level of Assistance: 6: Modified independent (Device/Increase time) Dynamic Sitting Balance Dynamic Sitting - Balance Support: Right upper extremity supported;Left upper extremity supported;Feet supported;During functional activity Dynamic Sitting - Level of Assistance: 6: Modified independent (Device/Increase time) Static Standing Balance Static Standing - Balance Support: Bilateral upper extremity supported;During functional  activity Static Standing - Level of Assistance: 5: Stand by assistance Dynamic Standing Balance Dynamic Standing - Balance Support: Bilateral upper extremity supported;During functional activity Dynamic Standing - Level of Assistance: 5: Stand by assistance Dynamic Standing - Balance Activities: Lateral lean/weight shifting;Reaching across midline Extremity Assessment  RUE Assessment RUE Assessment: Within Functional Limits LUE Assessment LUE Assessment: Within Functional Limits RLE Assessment RLE Assessment: Within Functional Limits LLE Assessment LLE Assessment: Exceptions to  WFL LLE AROM (degrees) Overall AROM Left Lower Extremity: Deficits;Due to pain;Other (Comment) (due to fear/apprehension) LLE Overall AROM Comments: hip flexion and abduction wfl; pt limited in hip extension - can obtain it to neutral when lying on stomach, but no hip extension past neutral - pt compensates with lumbar extension LLE Strength LLE Overall Strength: Deficits;Due to pain LLE Overall Strength Comments: grossly 3+ to 4/5 throughout available ROM  See FIM for current functional status  Yolande Skoda M 05/24/2014, 10:31 AM

## 2014-05-24 NOTE — Progress Notes (Signed)
NUTRITION FOLLOW UP  Pt meets criteria for SEVERE MALNUTRITION in the context of acute illness or injury as evidenced by a 5% weight loss in 10 days and energy intake </= 50% for >/= 5 days.  DOCUMENTATION CODES Per approved criteria  -Severe malnutrition in the context of acute illness or injury   INTERVENTION: Continue Ensure Complete po TID, each supplement provides 350 kcal and 13 grams of protein.  Provide 30 ml Prostat po TID, each supplement provides 100 kcal and 15 grams of protein.  Encourage PO intake.  NUTRITION DIAGNOSIS: Inadequate oral intake related to decreased appetite as evidenced by meal completion of 15-50%; ongoing  Goal: Pt to meet >/= 90% of their estimated nutrition needs; not met  Monitor:  PO intake, weight trends, labs, I/O's  63 y.o. 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 She developed left toe ulcers as well as left knee wound with infection and worsening of pain. Pt underwent L-AKA on 05/04/14.   10/22-Meal completion since admission to rehab is 15-50%. Pt reports she has a decreased appetite, which has been present over the past 2 months. Pt reports prior to admission, she has been eating fine with at least 2 full meals a day. Pt reports she has lost weight with her usual body weight of 200 lbs. Pt with a 18.5% weight loss in 3 months. Ensure complete has been ordered. Pt reports she would not like additional supplements or snacks as Ensure is fine for her. Pt was encouraged to eat her food at meals once diet is advances. Pt with no observed significant fat or muscle mass loss.   10/29-Meal completion has been 10-50%. Pt reports her appetite has been improving. Pt reports she is getting tired of the taste of Ensure. Pt is willing to try Prostat for extra protein. Will  order. Pt was encouraged to eat his food at meals and to drink/take her supplements.  Height: Ht Readings from Last 1 Encounters:  04/30/14 5' 3"  (1.6 m)    Weight: Wt Readings from Last 1 Encounters:  05/23/14 162 lb 0.6 oz (73.5 kg)   BMI:  Body mass index is 28.71 kg/(m^2).  Re-Estimated Nutritional Needs: Kcal: 2000-2200  Protein: 90-100 grams Fluid: 2 - 2.2 L/day  Skin: incision left thigh, non-pitting RLE edema, unstageable pressure ulcer on sacrum  Diet Order: Cardiac   Intake/Output Summary (Last 24 hours) at 05/24/14 1009 Last data filed at 05/24/14 0800  Gross per 24 hour  Intake    260 ml  Output      0 ml  Net    260 ml    Last BM: 10/29  Labs:   Recent Labs Lab 05/17/14 1028 05/19/14 0500  NA 140 141  K 4.5 3.8  CL 103 104  CO2 24 24  BUN 11 5*  CREATININE 1.12* 1.00  CALCIUM 9.0 9.2  GLUCOSE 119* 104*    CBG (last 3)  No results found for this basename: GLUCAP,  in the last 72 hours  Scheduled Meds: . ALPRAZolam  0.25 mg Oral BID  . aspirin EC  325 mg Oral Daily  . atorvastatin  40 mg Oral q1800  . carvedilol  18.75 mg Oral BID WC  . collagenase   Topical Daily  . docusate sodium  100 mg Oral BID  . DULoxetine  40  mg Oral Daily  . feeding supplement (ENSURE COMPLETE)  237 mL Oral TID WC  . pantoprazole  40 mg Oral BID AC  . polyethylene glycol  17 g Oral BID  . potassium chloride  10 mEq Oral BID  . ramipril  5 mg Oral Daily    Continuous Infusions:    Past Medical History  Diagnosis Date  . Hypertension   . HOH (hard of hearing)   . CVA (cerebral infarction)   . PAD (peripheral artery disease) 04/10/2014  . Cancer     cervical; was on chemo; has been in remission for 75yr  . Depression 04/19/2014  . Hx of cardiovascular stress test     Nuclear (9/15):  Inf ischemia, EF 35%; high risk  . Stroke Aug. 10, 2015  . STEMI (ST elevation myocardial infarction) 06/2011  . RBBB (right bundle branch block) 05/02/2014  . Chronic  kidney disease     daughter says no,they checked them,okay    Past Surgical History  Procedure Laterality Date  . Cardiac catheterization  07/03/2011    Dr. VIrish Lack . Tee without cardioversion N/A 03/08/2014    Procedure: TRANSESOPHAGEAL ECHOCARDIOGRAM (TEE);  Surgeon: PFay Records MD;  [facility]  Service: Cardiovascular;  Laterality: N/A;  . 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 Amputation Left 05/04/2014    Procedure: AMPUTATION ABOVE KNEE;  Surgeon: CAngelia Mould MD;  [facility];  Service: Vascular;  Laterality: Left;  . Esophagogastroduodenoscopy (egd) with propofol N/A 05/21/2014    Procedure: ESOPHAGOGASTRODUODENOSCOPY (EGD) WITH PROPOFOL;  Surgeon: JWinfield Cunas, MD;  [facility];  Service: Endoscopy;  Laterality: N/A;  . Colonoscopy with propofol N/A 05/21/2014    Procedure: COLONOSCOPY WITH PROPOFOL;  Surgeon: JWinfield Cunas, MD;  [facility];  Service: Endoscopy;  Laterality: N/A;    SKallie Locks MS, RD, LDN Pager # [phone] hours/ weekend pager # [phone]

## 2014-05-24 NOTE — Progress Notes (Signed)
Occupational Therapy Discharge Summary  Patient Details  Name: Traci Mclaughlin MRN: [id number] Date of Birth: 10/27/1950   Patient has met 9 of 9 long term goals due to improved activity tolerance, improved balance, postural control, ability to compensate for deficits and improved awareness.  Patient to discharge at overall Supervision level.  Patient's care partner is independent to provide the necessary physical and cognitive assistance at discharge. Patient's family scheduled for family education on 2 occasions, however did not attend.   Reasons goals not met: N/A. All LTGs met.   Recommendation:  Patient will benefit from ongoing skilled OT services in home health setting to continue to advance functional skills in the area of BADL, standing balance, minimize fall risk, and increase activity tolerance.  Equipment: No equipment provided  Reasons for discharge: treatment goals met and discharge from hospital  Patient/family agrees with progress made and goals achieved: Yes  OT Discharge Precautions/Restrictions  Precautions Precautions: Fall Precaution Comments: LLE AKA-HOH- needs written communication Required Braces or Orthoses: Other Brace/Splint Other Brace/Splint: shrinker to L leg Restrictions Weight Bearing Restrictions: Yes LLE Weight Bearing: Non weight bearing General   Vital Signs Therapy Vitals Pulse Rate: 77 BP: 127/61 mmHg Patient Position (if appropriate): Sitting Oxygen Therapy SpO2: 99 % O2 Device: Not Delivered Pain Pain Assessment Pain Assessment: No/denies pain Pain Score: 3  Pain Type: Surgical pain;Phantom pain Pain Location: Leg Pain Orientation: Left Pain Descriptors / Indicators: Aching Pain Onset: On-going Patients Stated Pain Goal: 2 Pain Intervention(s): Medication (See eMAR) Multiple Pain Sites: No ADL   Vision/Perception  Vision- History Baseline Vision/History: Wears glasses Wears Glasses: At all times Patient Visual Report:  No change from baseline Vision- Assessment Vision Assessment?: No apparent visual deficits Eye Alignment: Within Functional Limits Perception Comments: slight R inattention from prior stroke Praxis Praxis-Other Comments: this is improved with task specific training  Cognition Overall Cognitive Status: History of cognitive impairments - at baseline (from prior CVA) Arousal/Alertness: Awake/alert Orientation Level: Oriented X4 Attention: Sustained;Selective Focused Attention: Appears intact Sustained Attention: Appears intact Selective Attention: Appears intact Memory: Impaired Memory Impairment: Decreased recall of new information Awareness: Impaired Awareness Impairment: Anticipatory impairment Problem Solving: Impaired Problem Solving Impairment: Functional basic Behaviors: Lability;Poor frustration tolerance Safety/Judgment: Impaired Comments: Pt somewhat impulsive Sensation Sensation Light Touch: Appears Intact Stereognosis: Not tested Hot/Cold: Appears Intact Proprioception: Appears Intact Coordination Gross Motor Movements are Fluid and Coordinated: No Fine Motor Movements are Fluid and Coordinated: Yes Coordination and Movement Description: improved coordination, but still stumbly at times with sit to stand Motor  Motor Motor: Within Functional Limits Motor - Discharge Observations: in L UE with w/c propulsion up a ramp; improved in w/c propulsion on level surfaces Mobility  Bed Mobility Bed Mobility: Supine to Sit;Sit to Supine Supine to Sit: 6: Modified independent (Device/Increase time) Sit to Supine: 6: Modified independent (Device/Increase time) Transfers Transfers: Sit to Stand;Stand to Sit Sit to Stand: 5: Supervision Sit to Stand Details: Verbal cues for precautions/safety Sit to Stand Details (indicate cue type and reason): cues for hand placement Stand to Sit: 5: Supervision Stand to Sit Details (indicate cue type and reason): Verbal cues for  precautions/safety  Trunk/Postural Assessment  Cervical Assessment Cervical Assessment: Within Functional Limits Thoracic Assessment Thoracic Assessment: Within Functional Limits Lumbar Assessment Lumbar Assessment: Within Functional Limits Postural Control Postural Control: Within Functional Limits Protective Responses: appropriate timing Postural Limitations: slight slouched positioning, typical of chronic sitters  Balance Balance Balance Assessed: Yes Standardized Balance Assessment Standardized Balance Assessment: Timed Up and Go  Test Timed Up and Go Test TUG: Normal TUG Normal TUG (seconds): 44 Static Sitting Balance Static Sitting - Balance Support: Feet supported;Bilateral upper extremity supported Static Sitting - Level of Assistance: 6: Modified independent (Device/Increase time) Dynamic Sitting Balance Dynamic Sitting - Balance Support: During functional activity;Feet supported Dynamic Sitting - Level of Assistance: 6: Modified independent (Device/Increase time) Static Standing Balance Static Standing - Balance Support: During functional activity (alternating use of UE) Static Standing - Level of Assistance: 5: Stand by assistance Dynamic Standing Balance Dynamic Standing - Balance Support: During functional activity (alternating use of UE) Dynamic Standing - Level of Assistance: 5: Stand by assistance Dynamic Standing - Balance Activities: Lateral lean/weight shifting;Reaching across midline Extremity/Trunk Assessment RUE Assessment RUE Assessment: Within Functional Limits (grossly 4/5 strength) LUE Assessment LUE Assessment: Within Functional Limits (grossly 4/5 strength)  See FIM for current functional status  Bodi Palmeri, Quillian Quince 05/24/2014, 12:35 PM

## 2014-05-24 NOTE — Progress Notes (Signed)
Occupational Therapy Session Note  Patient Details  Name: Traci Mclaughlin MRN: [id number] Date of Birth: 07-30-1950  Today's Date: 05/24/2014 OT Individual Time: 1100-1200 OT Individual Time Calculation (min): 60 min    Short Term Goals: Week 2:  OT Short Term Goal 1 (Week 2): STGs = LTGs  Skilled Therapeutic Interventions/Progress Updates:   Therapy session scheduled for family education with daughter and daughter's boyfriend, however family did not show.   Pt seen for ADL retraining with focus on standing balance, activity tolerance, use of AE, and functional transfers. Pt completed bathing at sink at supervision level with cues 1x for locking w/c breaks before sit<>stand. Pt utilized reacher and sock-aid with increased time. Pt fatigued quickly during bathing and dressing, requiring multiple rest breaks. Pt requested to toilet. Completed stand pivot transfer w/c<>toilet at supervision level. Completed hygiene in sitting and clothing management in standing with RW and close supervision. Pt with no questions or concerns about discharge at this time. Therapist discussed follow-up OT and ensuring pt gets OOB daily to prevent decline in functional status and for skin integrity. Pt left sitting in w/c with lunch and all needs in reach.   Therapy Documentation Precautions:  Precautions Precautions: Fall Precaution Comments: LLE AKA-HOH- needs written communication Required Braces or Orthoses: Other Brace/Splint Other Brace/Splint: shrinker to L leg Restrictions Weight Bearing Restrictions: Yes LLE Weight Bearing: Non weight bearing Other Position/Activity Restrictions: wears Darco sandal to unweight L toes General:   Vital Signs: Therapy Vitals Pulse Rate: 77 BP: 127/61 mmHg Patient Position (if appropriate): Sitting Oxygen Therapy SpO2: 99 % O2 Device: Not Delivered Pain: No report of pain during therapy session.   See FIM for current functional status  Therapy/Group:  Individual Therapy  Duayne Cal 05/24/2014, 12:04 PM

## 2014-05-24 NOTE — Progress Notes (Signed)
Addendum: Patient had a brown medium stool at 0625. No blood noted. Suhail Peloquin A. Aviona Martenson, RN

## 2014-05-24 NOTE — Progress Notes (Signed)
Patient had a medium brown stool. No blood noted. Gerrianne Aydelott A. Geraldin Habermehl, RN.

## 2014-05-24 NOTE — Progress Notes (Signed)
Social Work Patient ID: Traci Mclaughlin, female   DOB: 1951/02/18, 63 y.o.   MRN: [id number]  Pt's family did not come for scheduled family education at [place] and 11am.  CSW called pt's dtr to inquire if they were still coming and had to leave a message.  Await return call.  Pt is scheduled for d/c tomorrow and family needs to learn how to bump her in/out of the home with the w/c.

## 2014-05-24 NOTE — Progress Notes (Signed)
Subjective/Complaints:     Review of Systems - limited by hearing loss and aphasia   Objective: Vital Signs: Blood pressure 151/78, pulse 81, temperature 98.3 F (36.8 C), temperature source Oral, resp. rate 18, weight 73.5 kg (162 lb 0.6 oz), SpO2 97.00%. No results found. Results for orders placed during the hospital encounter of 05/08/14 (from the past 72 hour(s))  CBC WITH DIFFERENTIAL     Status: Abnormal   Collection Time    05/22/14  7:00 AM      Result Value Ref Range   WBC 8.1  4.0 - 10.5 K/uL   RBC 2.85 (*) 3.87 - 5.11 MIL/uL   Hemoglobin 8.9 (*) 12.0 - 15.0 g/dL   HCT 27.3 (*) 36.0 - 46.0 %   MCV 95.8  78.0 - 100.0 fL   MCH 31.2  26.0 - 34.0 pg   MCHC 32.6  30.0 - 36.0 g/dL   RDW 13.5  11.5 - 15.5 %   Platelets 353  150 - 400 K/uL   Neutrophils Relative % 72  43 - 77 %   Neutro Abs 5.7  1.7 - 7.7 K/uL   Lymphocytes Relative 17  12 - 46 %   Lymphs Abs 1.4  0.7 - 4.0 K/uL   Monocytes Relative 9  3 - 12 %   Monocytes Absolute 0.8  0.1 - 1.0 K/uL   Eosinophils Relative 2  0 - 5 %   Eosinophils Absolute 0.2  0.0 - 0.7 K/uL   Basophils Relative 0  0 - 1 %   Basophils Absolute 0.0  0.0 - 0.1 K/uL  CBC WITH DIFFERENTIAL     Status: Abnormal   Collection Time    05/23/14  7:15 AM      Result Value Ref Range   WBC 8.0  4.0 - 10.5 K/uL   RBC 2.90 (*) 3.87 - 5.11 MIL/uL   Hemoglobin 9.1 (*) 12.0 - 15.0 g/dL   HCT 27.9 (*) 36.0 - 46.0 %   MCV 96.2  78.0 - 100.0 fL   MCH 31.4  26.0 - 34.0 pg   MCHC 32.6  30.0 - 36.0 g/dL   RDW 13.7  11.5 - 15.5 %   Platelets 379  150 - 400 K/uL   Neutrophils Relative % 71  43 - 77 %   Neutro Abs 5.7  1.7 - 7.7 K/uL   Lymphocytes Relative 17  12 - 46 %   Lymphs Abs 1.4  0.7 - 4.0 K/uL   Monocytes Relative 9  3 - 12 %   Monocytes Absolute 0.7  0.1 - 1.0 K/uL   Eosinophils Relative 3  0 - 5 %   Eosinophils Absolute 0.2  0.0 - 0.7 K/uL   Basophils Relative 0  0 - 1 %   Basophils Absolute 0.0  0.0 - 0.1 K/uL      HEENT: poor  dentition Cardio: RRR and no murmur Resp: CTA B/L and unlabored GI: BS positive and NT, ND Extremity:  Pulses positive and No Edema. Right foot Skin:   Intact and Wound C/D/I  Neuro: Flat, Cranial Nerve II-XII normal, Abnormal Motor 4/5 in RUE, 3- R HF, 4/5 R KE, ADF, 5/5 LUE, 3/5 L HF, Abnormal FMC Ataxic/ dec FMC and Dysarthric Musc/Skel:  Swelling Left thigh no erythema.Left AK stump healing well with staples intact Gen NAD Psych: no evidence of aggitation or lability   Assessment/Plan: 1. Functional deficits secondary to L-AKA due to PAD  which require 3+ hours per  day of interdisciplinary therapy in a comprehensive inpatient rehab setting. Physiatrist is providing close team supervision and 24 hour management of active medical problems listed below. Physiatrist and rehab team continue to assess barriers to discharge/monitor patient progress toward functional and medical goals. Plan D/C in am FIM: FIM - Bathing Bathing Steps Patient Completed: Chest;Right Arm;Left Arm;Front perineal area;Right upper leg;Left upper leg;Abdomen;Right lower leg (including foot);Buttocks Bathing: 5: Supervision: Safety issues/verbal cues  FIM - Upper Body Dressing/Undressing Upper body dressing/undressing steps patient completed: Thread/unthread right bra strap;Thread/unthread left bra strap;Hook/unhook bra;Thread/unthread right sleeve of pullover shirt/dresss;Thread/unthread left sleeve of pullover shirt/dress;Put head through opening of pull over shirt/dress;Pull shirt over trunk Upper body dressing/undressing: 5: Set-up assist to: Obtain clothing/put away FIM - Lower Body Dressing/Undressing Lower body dressing/undressing steps patient completed: Thread/unthread right pants leg;Thread/unthread left pants leg;Thread/unthread right underwear leg;Thread/unthread left underwear leg;Pull underwear up/down;Pull pants up/down;Don/Doff right sock Lower body dressing/undressing: 4: Steadying Assist  FIM -  Toileting Toileting steps completed by patient: Adjust clothing prior to toileting;Performs perineal hygiene Toileting Assistive Devices: Grab bar or rail for support Toileting: 3: Mod-Patient completed 2 of 3 steps  FIM - Radio producer Devices: Environmental consultant;Bedside commode Toilet Transfers: 5-To toilet/BSC: Supervision (verbal cues/safety issues);5-From toilet/BSC: Supervision (verbal cues/safety issues)  FIM - Control and instrumentation engineer Devices: Walker;Arm rests Bed/Chair Transfer: 6: Supine > Sit: No assist;6: Sit > Supine: No assist;5: Bed > Chair or W/C: Supervision (verbal cues/safety issues);5: Chair or W/C > Bed: Supervision (verbal cues/safety issues)  FIM - Locomotion: Wheelchair Distance: 150 Locomotion: Wheelchair: 5: Travels 150 ft or more: maneuvers on rugs and over door sills with supervision, cueing or coaxing FIM - Locomotion: Ambulation Locomotion: Ambulation Assistive Devices: Administrator Ambulation/Gait Assistance: 5: Supervision Locomotion: Ambulation: 2: Travels 50 - 149 ft with supervision/safety issues  Comprehension Comprehension Mode: Visual Comprehension: 5-Follows basic conversation/direction: With extra time/assistive device  Expression Expression Mode: Verbal Expression Assistive Devices: 6-Communication board Expression: 5-Expresses basic needs/ideas: With extra time/assistive device  Social Interaction Social Interaction Mode: Asleep Social Interaction: 5-Interacts appropriately 90% of the time - Needs monitoring or encouragement for participation or interaction.  Problem Solving Problem Solving: 4-Solves basic 75 - 89% of the time/requires cueing 10 - 24% of the time  Memory Memory Assistive Devices: Other (Comment) (reminder poster) Memory: 4-Recognizes or recalls 75 - 89% of the time/requires cueing 10 - 24% of the time  Medical Problem List and Plan:  1. Functional deficits secondary to  L-AKA due to PAD.  2. LLE DVT/Anticoagulation: Pharmaceutical: xarelto held  -s/p IVCF in place  -ASA to resume today (no xarelto)---plavix to be added at a later date potentially depending on whether there are any bleeding complications 3. Pain Management:  -oxycontin stopped due to lethargy  -continue oxycodone prn. Due to hearing impairment and aphasia , has difficulty communicating need for prn meds -anxiety is a major issue 4. Mood: Continue cymbalta for pain as well as mood stabilization. Team to provide ego support and encouragement to help with participation. LCSW to follow for evaluation and support.  5. Neuropsych: This patient is not capable of making decisions on her own behalf.  6. Skin/Wound Care: wound clean 7. Fluids/Electrolytes/Nutrition: Monitor intake for adequate hydration and nutritional status.     8. HTN: Will monitor every 8 hours. Continue Coreg. Hold Altace given hypotension.  9. CAD: Monitor for symptoms with recommendations to add nitrate if needed. Continue coreg bid, Altace and Lipitor.  10. Recent L-MCA infarct residual  mild R HP: on no meds at present.  11. Hypokalemia: resolved past supplementation ----labs ok  12. Leucocytosis: decreased to 8k yest. Temp normal  -cultures negative   -off empiric cipro    13. Acute blood loss anemia:    -no further BRBPR  -GI resumed xarelto (15mg  currently) Monday evening and pt started to bleed again (now on hold again)  -colonoscopy with rectal ulcers and internal hemorrhoids---likely source of bleed with a/c   -trying to keep stool soft  -hgb up to 9.1 today    LOS (Days) 16 A FACE TO FACE EVALUATION WAS PERFORMED  Hershy Flenner E 05/24/2014, 6:45 AM

## 2014-05-24 NOTE — Progress Notes (Signed)
Occupational Therapy Session Note  Patient Details  Name: Traci Mclaughlin MRN: [id number] Date of Birth: 1950-11-06  Today's Date: 05/24/2014 OT Individual Time: 1330-1400 OT Individual Time Calculation (min): 30 min    Short Term Goals: Week 2:  OT Short Term Goal 1 (Week 2): STGs = LTGs  Skilled Therapeutic Interventions/Progress Updates: Therapeutic activity with emphasis on w/c mobility, transfers (tub bench, chair), and seated grooming.   Pt received in her w/c and receptive for final treatment, using communication board as needed for instruction.    With extra time and supervision pt completed squat-pivot transfer from w/c to tub bench and back using standard tub with only setup assist to lock brakes of w/c.  Pt then requested assist with grooming at sink (shaving facial hair, max assist) and completed her session with similar squat-pivot transfer to recliner.   Pt appeared relaxed and in no pain throughout session and reported no concerns relating to planned discharge home.      Therapy Documentation Precautions:  Precautions Precautions: Fall Precaution Comments: LLE AKA-HOH- needs written communication Required Braces or Orthoses: Other Brace/Splint Other Brace/Splint: shrinker to L leg Restrictions Weight Bearing Restrictions: Yes LLE Weight Bearing: Non weight bearing Other Position/Activity Restrictions: wears Darco sandal to unweight L toes  Vital Signs: Therapy Vitals Temp: 98.2 F (36.8 C) Temp Source: Oral Pulse Rate: 73 Resp: 16 BP: 125/69 mmHg Patient Position (if appropriate): Sitting Oxygen Therapy SpO2: 100 % O2 Device: Not Delivered  Pain: Pain Assessment Pain Assessment: No/denies pain Pain Score: 0-No pain  See FIM for current functional status  Therapy/Group: Individual Therapy  [place] 05/24/2014, 4:07 PM

## 2014-05-25 LAB — CBC WITH DIFFERENTIAL/PLATELET
BASOS ABS: 0 10*3/uL (ref 0.0–0.1)
Basophils Relative: 1 % (ref 0–1)
Eosinophils Absolute: 0.3 10*3/uL (ref 0.0–0.7)
Eosinophils Relative: 4 % (ref 0–5)
HCT: 27.1 % — ABNORMAL LOW (ref 36.0–46.0)
Hemoglobin: 8.7 g/dL — ABNORMAL LOW (ref 12.0–15.0)
LYMPHS PCT: 20 % (ref 12–46)
Lymphs Abs: 1.3 10*3/uL (ref 0.7–4.0)
MCH: 30.7 pg (ref 26.0–34.0)
MCHC: 32.1 g/dL (ref 30.0–36.0)
MCV: 95.8 fL (ref 78.0–100.0)
Monocytes Absolute: 0.8 10*3/uL (ref 0.1–1.0)
Monocytes Relative: 12 % (ref 3–12)
NEUTROS ABS: 4.1 10*3/uL (ref 1.7–7.7)
Neutrophils Relative %: 63 % (ref 43–77)
PLATELETS: 351 10*3/uL (ref 150–400)
RBC: 2.83 MIL/uL — ABNORMAL LOW (ref 3.87–5.11)
RDW: 14.1 % (ref 11.5–15.5)
WBC: 6.4 10*3/uL (ref 4.0–10.5)

## 2014-05-25 MED ORDER — ASPIRIN EC 81 MG PO TBEC
81.0000 mg | DELAYED_RELEASE_TABLET | Freq: Every day | ORAL | Status: DC
  Filled 2014-05-25: qty 1

## 2014-05-25 MED ORDER — CLOPIDOGREL BISULFATE 75 MG PO TABS
75.0000 mg | ORAL_TABLET | Freq: Every day | ORAL | Status: DC
  Administered 2014-05-25: 75 mg via ORAL
  Filled 2014-05-25 (×2): qty 1

## 2014-05-25 MED ORDER — ALPRAZOLAM 0.25 MG PO TABS: 0.2500 mg | ORAL_TABLET | Freq: Every day | ORAL | Status: DC

## 2014-05-25 MED ORDER — ASPIRIN 81 MG PO TBEC: 81.0000 mg | DELAYED_RELEASE_TABLET | Freq: Every day | ORAL | Status: AC

## 2014-05-25 MED ORDER — POLYETHYLENE GLYCOL 3350 17 G PO PACK: 17.0000 g | PACK | Freq: Two times a day (BID) | ORAL | Status: DC

## 2014-05-25 MED ORDER — DULOXETINE HCL 40 MG PO CPEP: 40.0000 mg | ORAL_CAPSULE | Freq: Every day | ORAL | Status: DC

## 2014-05-25 MED ORDER — CLOPIDOGREL BISULFATE 75 MG PO TABS: 75.0000 mg | ORAL_TABLET | Freq: Every day | ORAL | Status: AC

## 2014-05-25 MED ORDER — ACETAMINOPHEN 325 MG PO TABS: 325.0000 mg | ORAL_TABLET | ORAL | Status: AC | PRN

## 2014-05-25 MED ORDER — OXYCODONE-ACETAMINOPHEN 10-325 MG PO TABS: 0.5000 | ORAL_TABLET | Freq: Three times a day (TID) | ORAL | Status: AC | PRN

## 2014-05-25 MED ORDER — SENNOSIDES-DOCUSATE SODIUM 8.6-50 MG PO TABS: 2.0000 | ORAL_TABLET | Freq: Two times a day (BID) | ORAL | Status: AC

## 2014-05-25 MED ORDER — PANTOPRAZOLE SODIUM 40 MG PO TBEC: 40.0000 mg | DELAYED_RELEASE_TABLET | Freq: Two times a day (BID) | ORAL | Status: DC

## 2014-05-25 MED ORDER — SENNOSIDES-DOCUSATE SODIUM 8.6-50 MG PO TABS
2.0000 | ORAL_TABLET | Freq: Two times a day (BID) | ORAL | Status: DC
  Administered 2014-05-25 (×2): 2 via ORAL
  Filled 2014-05-25 (×2): qty 2

## 2014-05-25 MED ORDER — RIVAROXABAN 20 MG PO TABS
20.0000 mg | ORAL_TABLET | Freq: Every day | ORAL | Status: DC
  Filled 2014-05-25: qty 1

## 2014-05-25 MED ORDER — COLLAGENASE 250 UNIT/GM EX OINT: TOPICAL_OINTMENT | Freq: Every day | CUTANEOUS | Status: AC

## 2014-05-25 NOTE — Discharge Summary (Signed)
Physician Discharge Summary  Patient ID: Traci Mclaughlin MRN: [id number] DOB/AGE: 1951/03/23 63 y.o.  Admit date: 05/08/2014 Discharge date: 05/25/2014  Discharge Diagnoses:  Principal Problem:   Unilateral complete AKA Active Problems:   Acute kidney injury   Stroke   Hypertension   Depression   Leukocytosis   History of DVT (deep vein thrombosis)   Rectal bleeding--due to rectal ulcers/constipation   Noncompliance with treatment plan   Discharged Condition: Stable.    Labs:  Basic Metabolic Panel:  Recent Labs Lab 05/19/14 0500  NA 141  K 3.8  CL 104  CO2 24  GLUCOSE 104*  BUN 5*  CREATININE 1.00  CALCIUM 9.2    CBC:  Recent Labs Lab 05/23/14 0715 05/24/14 0630 05/25/14 0550  WBC 8.0 7.2 6.4  NEUTROABS 5.7 4.5 4.1  HGB 9.1* 8.9* 8.7*  HCT 27.9* 27.6* 27.1*  MCV 96.2 96.8 95.8  PLT 379 368 351    CBG:  Recent Labs Lab 05/19/14 2047  GLUCAP 102*    Brief HPI:   Traci Mclaughlin is a 63 y.o. 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 Patient well known to CIR from prior stay. She developed left toe ulcers as well as left knee wound with infection and worsening of pain. She was admitted on 04/30/14 for pain control as well as IV antibiotics for treatment of infection. 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 She was cleared for surgery by Dr. Burt Knack and underwent L-AKA by Dr. Scot Dock on 05/04/14. PT/OT evaluations done and CIR recommended for follow up therapy. She is limited by anxiety, lability, communicative deficits (does not use sign language/ minimal ability to read lips) as well as pain.    Hospital Course: Traci Mclaughlin was admitted to rehab 05/08/2014 for inpatient therapies to consist of PT and OT at least three hours five days a  week. Past admission physiatrist, therapy team and rehab RN have worked together to provide customized collaborative inpatient rehab. Patient continued to have high levels of anxiety as well as poor pain control. She has required continued ego support by rehab team to help with psychological, hearing deficits as well as cognitive issues. Celexa was increased to help with neuropathy as well as mood stabilization and low dose xanax was added to help with anxiety. She was educated on multiple occasions on importance of desensitization of L-AKA site. Mirror therapy has been used additonally to help with phantom pain.  Oxycontin was added to help with pain management but patient developed excessive sedation with hypotension due to this requiring narcan for reversal. Urine culture of 10/14 and 10/21 were negative for infection. Reactive leucocytosis has resolved and renal status has been stable. Po intake is poor and patient was provided supplements to help promote wound healing and maintain adequate nutritional status.   Hospital course complicated by constipation with subsequent rectal bleeding and drop in Hgb to 7.6 . Traci Mclaughlin was held and IVC filter was placed by interventional radiology on 05/18/14. [facility] was consulted for input and endoscopy of 10/26 showed normal mucosa with no source of bleeding. This was followed by colonoscopy which revealed multiple large stercoral rectal ulcers as well as hemorrhoids. This was felt to be due to constipation and chronic Miralax was recommended to keep stools soft. Biopsy of ulcers revealed benign colorectal mucosa without evidence of  malignancy. She has required encouragement for compliance with laxatives. Xarelto was resumed but she had recurrent rectal bleeding the next day therefore this was discontinued and ASA was resumed. She is to resume low dose ASA and plavix at discharge. H/H has been followed along and has recovered to 8.9.    Left AKA site is intact and is  healing well without signs or symptoms of infection. Every other staple was discontinued at discharge. She has developed sacral decub and WOC recommends santyl with damp to dry dressing changes daily. She is to continue pressure relief measures as well as nutritional supplements to help with wound healing. HHRN has been arranged for wound care as well as check of CBC on 11/03 to monitor for stability on resumption of Plavix. Patient's overall mentation has greatly improved with resolution of anxiety as well as good pain management. She has progressed to supervision at wheelchair level. Multiple attempts were made to get family in for family education but they did not show up for these sessions.  [facility] has been set up for follow up [place] [place] and [place] as well as HHRN for follow up on wound care past discharge.   Rehab course: During patient's stay in rehab weekly team conferences were held to monitor patient's progress, set goals and discuss barriers to discharge. Patient has had improvement in activity tolerance, balance, postural control, as well as ability to compensate for deficits. Patient is able to complete bathing and dressing tasks with use of AE at supervision level but requires multiple rest breaks due to easy fatigability.  She requires supervision for transfers and is able to ambulate 52' with bariatric RW and standby assist. Patient has been instructed on continuting HEP as well as mirror therapy past discharge. Family education was set but family did not show up for these sessions.   Disposition: Home  Diet: Heart Healthy   Wound care:  Cleanse sacral decub with soap and water. Pat dry. Apply santyl and damp to dry dressing to area. Change daily. Wash left amputation site with soap and water, pat dry and apply dry stump shrinker daily  Special Instructions: 1. Boost every 30 minute when in chair. Attempt to lie on side when in bed.  2. Contact Dr. Scot Dock if you develop any  redness, pain, drainage from incision or Fever/chills.  3. Need to stay on bowel program so that you do not strain and have a BM daily. Constipation will cause recurrent rectal bleeding.  4. Needs 24 hours supervision.     Medication List    STOP taking these medications       rivaroxaban 20 MG Tabs tablet  Commonly known as:  XARELTO      TAKE these medications       acetaminophen 325 MG tablet  Commonly known as:  TYLENOL  Take 1-2 tablets (325-650 mg total) by mouth every 4 (four) hours as needed for mild pain.     ALPRAZolam 0.25 MG tablet  Commonly known as:  XANAX  Take 1 tablet (0.25 mg total) by mouth daily after breakfast.     aspirin 81 MG EC tablet  Take 1 tablet (81 mg total) by mouth daily.     atorvastatin 40 MG tablet  Commonly known as:  LIPITOR  Take 40 mg by mouth daily at 6 PM.     carvedilol 6.25 MG tablet  Commonly known as:  COREG  Take 3 tablets (18.75 mg total) by mouth 2 (two) times daily  with a meal.     clopidogrel 75 MG tablet  Commonly known as:  PLAVIX  Take 1 tablet (75 mg total) by mouth daily.     collagenase ointment  Commonly known as:  SANTYL  Apply topically daily. Apply to sacrum and cover with damp to dry dressing daily.     DSS 100 MG Caps  Take 100 mg by mouth 2 (two) times daily.     DULoxetine HCl 40 MG Cpep  Take 40 mg by mouth daily.     oxyCODONE-acetaminophen 10-325 MG per tablet--# 30 pills  Commonly known as:  PERCOCET  Take 0.5-1 tablets by mouth every 8 (eight) hours as needed for pain.     pantoprazole 40 MG tablet  Commonly known as:  PROTONIX  Take 1 tablet (40 mg total) by mouth 2 (two) times daily before a meal.     ramipril 5 MG capsule  Commonly known as:  ALTACE  Take 1 capsule (5 mg total) by mouth daily.     senna-docusate 8.6-50 MG per tablet  Commonly known as:  Senokot-S  Take 2 tablets by mouth 2 (two) times daily.           Follow-up Information   Follow up with Meredith Staggers, MD  On 06/27/2014. (Be there at  10  for 10:20 am  appointment )    Specialty:  Physical Medicine and Rehabilitation   Contact information:   [address], [address] [place] [address] [phone]       Follow up with Angelia Mould, MD On 05/30/2014. (appointment at 9:30 for post op  follow up.)    Specialty:  Vascular Surgery   Contact information:   [place] [address] [phone]       Follow up with Jettie Booze., MD On 06/19/2014. (follow up appointment at 10:30 am)    Specialty:  Interventional Cardiology   Contact information:   [address] [address] [phone]       Follow up with [name], PA-C On 06/04/2014. (@ 3 PM)    Specialty:  Physician Assistant   Contact information:   [address] [place] [address] [phone]       Signed: Bary Leriche 05/25/2014, 8:58 AM

## 2014-05-25 NOTE — Progress Notes (Signed)
  Progress Note    05/25/2014 9:14 AM 4 Days Post-Op  Subjective:  Still with some pain but it is okay   Filed Vitals:   05/25/14 0611  BP: 130/71  Pulse: 79  Temp: 98 F (36.7 C)  Resp: 16    Physical Exam: Incisions:  Healing nicely with staples in tact   CBC    Component Value Date/Time   WBC 6.4 05/25/2014 0550   RBC 2.83* 05/25/2014 0550   HGB 8.7* 05/25/2014 0550   HCT 27.1* 05/25/2014 0550   PLT 351 05/25/2014 0550   MCV 95.8 05/25/2014 0550   MCH 30.7 05/25/2014 0550   MCHC 32.1 05/25/2014 0550   RDW 14.1 05/25/2014 0550   LYMPHSABS 1.3 05/25/2014 0550   MONOABS 0.8 05/25/2014 0550   EOSABS 0.3 05/25/2014 0550   BASOSABS 0.0 05/25/2014 0550    BMET    Component Value Date/Time   NA 141 05/19/2014 0500   K 3.8 05/19/2014 0500   CL 104 05/19/2014 0500   CO2 24 05/19/2014 0500   GLUCOSE 104* 05/19/2014 0500   BUN 5* 05/19/2014 0500   CREATININE 1.00 05/19/2014 0500   CALCIUM 9.2 05/19/2014 0500   GFRNONAA 59* 05/19/2014 0500   GFRAA 68* 05/19/2014 0500    INR    Component Value Date/Time   INR 2.72* 05/17/2014 1028     Intake/Output Summary (Last 24 hours) at 05/25/14 0914 Last data filed at 05/25/14 0800  Gross per 24 hour  Intake    920 ml  Output      0 ml  Net    920 ml     Assessment/Plan:  63 y.o. female is s/p left above knee amputation  21 Days Post-Op  -pt doing well today and states she is going home today! -her stump is viable -she has an appointment to f/u with Dr. Scot Dock on 05/30/14 @ 9:30am   Leontine Locket, PA-C [facility] [phone] 05/25/2014 9:14 AM

## 2014-05-25 NOTE — Progress Notes (Addendum)
Subjective/Complaints:  No abd pain, reviewed GI notes, was ok to restart anticoagulant therapy, Stercoral ulcer requiring laxatives to prevent constipation,  No further rectal bleed. Hgb stable   Review of Systems - limited by hearing loss and aphasia   Objective: Vital Signs: Blood pressure 130/71, pulse 79, temperature 98 F (36.7 C), temperature source Oral, resp. rate 16, weight 73.5 kg (162 lb 0.6 oz), SpO2 99.00%. No results found. Results for orders placed during the hospital encounter of 05/08/14 (from the past 72 hour(s))  CBC WITH DIFFERENTIAL     Status: Abnormal   Collection Time    05/22/14  7:00 AM      Result Value Ref Range   WBC 8.1  4.0 - 10.5 K/uL   RBC 2.85 (*) 3.87 - 5.11 MIL/uL   Hemoglobin 8.9 (*) 12.0 - 15.0 g/dL   HCT 27.3 (*) 36.0 - 46.0 %   MCV 95.8  78.0 - 100.0 fL   MCH 31.2  26.0 - 34.0 pg   MCHC 32.6  30.0 - 36.0 g/dL   RDW 13.5  11.5 - 15.5 %   Platelets 353  150 - 400 K/uL   Neutrophils Relative % 72  43 - 77 %   Neutro Abs 5.7  1.7 - 7.7 K/uL   Lymphocytes Relative 17  12 - 46 %   Lymphs Abs 1.4  0.7 - 4.0 K/uL   Monocytes Relative 9  3 - 12 %   Monocytes Absolute 0.8  0.1 - 1.0 K/uL   Eosinophils Relative 2  0 - 5 %   Eosinophils Absolute 0.2  0.0 - 0.7 K/uL   Basophils Relative 0  0 - 1 %   Basophils Absolute 0.0  0.0 - 0.1 K/uL  CBC WITH DIFFERENTIAL     Status: Abnormal   Collection Time    05/23/14  7:15 AM      Result Value Ref Range   WBC 8.0  4.0 - 10.5 K/uL   RBC 2.90 (*) 3.87 - 5.11 MIL/uL   Hemoglobin 9.1 (*) 12.0 - 15.0 g/dL   HCT 27.9 (*) 36.0 - 46.0 %   MCV 96.2  78.0 - 100.0 fL   MCH 31.4  26.0 - 34.0 pg   MCHC 32.6  30.0 - 36.0 g/dL   RDW 13.7  11.5 - 15.5 %   Platelets 379  150 - 400 K/uL   Neutrophils Relative % 71  43 - 77 %   Neutro Abs 5.7  1.7 - 7.7 K/uL   Lymphocytes Relative 17  12 - 46 %   Lymphs Abs 1.4  0.7 - 4.0 K/uL   Monocytes Relative 9  3 - 12 %   Monocytes Absolute 0.7  0.1 - 1.0 K/uL    Eosinophils Relative 3  0 - 5 %   Eosinophils Absolute 0.2  0.0 - 0.7 K/uL   Basophils Relative 0  0 - 1 %   Basophils Absolute 0.0  0.0 - 0.1 K/uL  CBC WITH DIFFERENTIAL     Status: Abnormal   Collection Time    05/24/14  6:30 AM      Result Value Ref Range   WBC 7.2  4.0 - 10.5 K/uL   RBC 2.85 (*) 3.87 - 5.11 MIL/uL   Hemoglobin 8.9 (*) 12.0 - 15.0 g/dL   HCT 27.6 (*) 36.0 - 46.0 %   MCV 96.8  78.0 - 100.0 fL   MCH 31.2  26.0 - 34.0 pg   MCHC  32.2  30.0 - 36.0 g/dL   RDW 13.9  11.5 - 15.5 %   Platelets 368  150 - 400 K/uL   Neutrophils Relative % 62  43 - 77 %   Neutro Abs 4.5  1.7 - 7.7 K/uL   Lymphocytes Relative 23  12 - 46 %   Lymphs Abs 1.7  0.7 - 4.0 K/uL   Monocytes Relative 10  3 - 12 %   Monocytes Absolute 0.7  0.1 - 1.0 K/uL   Eosinophils Relative 5  0 - 5 %   Eosinophils Absolute 0.3  0.0 - 0.7 K/uL   Basophils Relative 0  0 - 1 %   Basophils Absolute 0.0  0.0 - 0.1 K/uL  CBC WITH DIFFERENTIAL     Status: Abnormal   Collection Time    05/25/14  5:50 AM      Result Value Ref Range   WBC 6.4  4.0 - 10.5 K/uL   RBC 2.83 (*) 3.87 - 5.11 MIL/uL   Hemoglobin 8.7 (*) 12.0 - 15.0 g/dL   HCT 27.1 (*) 36.0 - 46.0 %   MCV 95.8  78.0 - 100.0 fL   MCH 30.7  26.0 - 34.0 pg   MCHC 32.1  30.0 - 36.0 g/dL   RDW 14.1  11.5 - 15.5 %   Platelets 351  150 - 400 K/uL   Neutrophils Relative % 63  43 - 77 %   Neutro Abs 4.1  1.7 - 7.7 K/uL   Lymphocytes Relative 20  12 - 46 %   Lymphs Abs 1.3  0.7 - 4.0 K/uL   Monocytes Relative 12  3 - 12 %   Monocytes Absolute 0.8  0.1 - 1.0 K/uL   Eosinophils Relative 4  0 - 5 %   Eosinophils Absolute 0.3  0.0 - 0.7 K/uL   Basophils Relative 1  0 - 1 %   Basophils Absolute 0.0  0.0 - 0.1 K/uL      HEENT: poor dentition Cardio: RRR and no murmur Resp: CTA B/L and unlabored GI: BS positive and NT, ND Extremity:  Pulses positive and No Edema. Right foot Skin:   Intact and Wound C/D/I  Neuro: Flat, Cranial Nerve II-XII normal, Abnormal  Motor 4/5 in RUE, 3- R HF, 4/5 R KE, ADF, 5/5 LUE, 3/5 L HF, Abnormal FMC Ataxic/ dec FMC and Dysarthric Musc/Skel:  Swelling Left thigh no erythema.Left AK stump healing well with staples intact Gen NAD Psych: no evidence of aggitation or lability   Assessment/Plan: 1. Functional deficits secondary to L-AKA due to PAD  3 wks post op with excellent healing may remove staples prior to D/C    Stable for D/C today Resume Plavix, CBC per HH next week F/u PCP in 1weeks F/u VVS F/u GI F/u PM&R 3 weeks See D/C summary See D/C instructions FIM: FIM - Bathing Bathing Steps Patient Completed: Chest;Right Arm;Left Arm;Front perineal area;Right upper leg;Left upper leg;Abdomen;Right lower leg (including foot);Buttocks Bathing: 5: Supervision: Safety issues/verbal cues  FIM - Upper Body Dressing/Undressing Upper body dressing/undressing steps patient completed: Thread/unthread right bra strap;Thread/unthread left bra strap;Hook/unhook bra;Thread/unthread right sleeve of pullover shirt/dresss;Thread/unthread left sleeve of pullover shirt/dress;Put head through opening of pull over shirt/dress;Pull shirt over trunk Upper body dressing/undressing: 5: Set-up assist to: Obtain clothing/put away FIM - Lower Body Dressing/Undressing Lower body dressing/undressing steps patient completed: Thread/unthread right pants leg;Thread/unthread left pants leg;Thread/unthread right underwear leg;Thread/unthread left underwear leg;Pull underwear up/down;Pull pants up/down;Don/Doff right sock;Don/Doff right shoe Lower  body dressing/undressing: 5: Set-up assist to: Obtain clothing  FIM - Toileting Toileting steps completed by patient: Adjust clothing prior to toileting;Performs perineal hygiene;Adjust clothing after toileting Toileting Assistive Devices: Grab bar or rail for support Toileting: 5: Supervision: Safety issues/verbal cues  FIM - Radio producer Devices: Environmental consultant;Bedside  commode Toilet Transfers: 5-To toilet/BSC: Supervision (verbal cues/safety issues);5-From toilet/BSC: Supervision (verbal cues/safety issues)  FIM - Control and instrumentation engineer Devices: Walker;Arm rests Bed/Chair Transfer: 6: Supine > Sit: No assist;6: Sit > Supine: No assist;5: Bed > Chair or W/C: Supervision (verbal cues/safety issues);5: Chair or W/C > Bed: Supervision (verbal cues/safety issues)  FIM - Locomotion: Wheelchair Distance: 150 Locomotion: Wheelchair: 5: Travels 150 ft or more: maneuvers on rugs and over door sills with supervision, cueing or coaxing FIM - Locomotion: Ambulation Locomotion: Ambulation Assistive Devices: Administrator Ambulation/Gait Assistance: 5: Supervision Locomotion: Ambulation: 2: Travels 50 - 149 ft with supervision/safety issues  Comprehension Comprehension Mode: Auditory Comprehension: 5-Understands basic 90% of the time/requires cueing < 10% of the time  Expression Expression Mode: Verbal Expression Assistive Devices: 6-Communication board Expression: 5-Expresses basic 90% of the time/requires cueing < 10% of the time.  Social Interaction Social Interaction Mode: Asleep Social Interaction: 5-Interacts appropriately 90% of the time - Needs monitoring or encouragement for participation or interaction.  Problem Solving Problem Solving: 5-Solves basic 90% of the time/requires cueing < 10% of the time  Memory Memory Assistive Devices: Other (Comment) (reminder poster) Memory: 5-Recognizes or recalls 90% of the time/requires cueing < 10% of the time  Medical Problem List and Plan:  1. 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  2. LLE DVT/Anticoagulation: Pharmaceutical: xarelto held  -s/p IVCF in place  -ok for  , resume ASA and Plavix  3. Pain Management:  -oxycontin stopped due to lethargy  -continue oxycodone prn. Due to hearing impairment and aphasia , has difficulty communicating need for prn  meds -anxiety is a major issue 4. Mood: Continue cymbalta for pain as well as mood stabilization. Team to provide ego support and encouragement to help with participation. LCSW to follow for evaluation and support.  5. Neuropsych: This patient is not capable of making decisions on her own behalf.  6. Skin/Wound Care: wound clean 7. Fluids/Electrolytes/Nutrition: Monitor intake for adequate hydration and nutritional status.     8. HTN: Will monitor every 8 hours. Continue Coreg. Hold Altace given hypotension.  9. CAD: Monitor for symptoms with recommendations to add nitrate if needed. Continue coreg bid, Altace and Lipitor.  10. Recent L-MCA infarct residual mild R HP: on no meds at present.  11. Hypokalemia: resolved past supplementation ----labs ok  12. Leucocytosis: decreased to 8k yest. Temp normal  -cultures negative   -off empiric cipro    13. Acute blood loss anemia:    -no further BRBPR  -GI f/u  -colonoscopy with rectal ulcers and internal hemorrhoids---likely source of bleed with a/c   -trying to keep stool soft  -hgb stable    LOS (Days) 17 A FACE TO FACE EVALUATION WAS PERFORMED  Kyliyah Stirn E 05/25/2014, 6:33 AM

## 2014-05-25 NOTE — Plan of Care (Signed)
Problem: RH Other (Specify) Goal: RH LTG Other (Specify) Outcome: Not Met (add Reason) Pt's family no showed for 2 scheduled family training sessions, as well as no showing during the 8 hour PT work day on day of discharge.

## 2014-05-25 NOTE — Progress Notes (Signed)
Loop recorder 

## 2014-05-25 NOTE — Progress Notes (Signed)
Social Work Patient ID: Traci Mclaughlin, female   DOB: August 27, 1950, 63 y.o.   MRN: [id number]  CSW has not received a return phone call from [name], pt's dtr.  CSW sent her a text today, in an effort to reach out a different way, to let her know that [name], OT was willing to train family on bumping pt up/down in the w/c today if they could come in by 4pm.  CSW did not receive a response to that text either.  Dtr did speak with [name], RN, on 05-24-14 and let her know that family could not pick pt up until after 5pm today and that it may actually be closer to 7pm.  CSW has all arrangements made for pt's w/c and her [name] and MD f/u.  CSW remains available to pt/family should needs arise.

## 2014-05-25 NOTE — Progress Notes (Signed)
Called dtr on phone and left a message about discharge and pt's anxious to go home. Please call back to hospital to let us know you are coming to pick her up. Received a call back and spoke with dtr about discharge and she noted she was in [place] and would be on her way to pick up the patient. Margarito Liner

## 2014-05-26 ENCOUNTER — Telehealth: Payer: Self-pay | Admitting: Internal Medicine

## 2014-05-26 MED ORDER — CARVEDILOL 6.25 MG PO TABS: 18.7500 mg | ORAL_TABLET | Freq: Two times a day (BID) | ORAL | Status: AC

## 2014-05-26 MED ORDER — RAMIPRIL 5 MG PO CAPS: 5.0000 mg | ORAL_CAPSULE | Freq: Every day | ORAL | Status: AC

## 2014-05-26 MED ORDER — DSS 100 MG PO CAPS: 100.0000 mg | ORAL_CAPSULE | Freq: Two times a day (BID) | ORAL | Status: AC

## 2014-05-26 NOTE — Progress Notes (Addendum)
Traci Mclaughlin, 63 y.o. female admitted 05/08/14 with the diagnosis of left AKA. Patient discharged from rehab at 2109 with family. A&Ox4, respirations even and unlabored, skin appropriate for ethnicity. Denied pain, nausea or headache. Afebrile. Staples on left stump remains intact. Discharge instruction sheet given. Reviewed medications, times and dosages. Daughter verbalized understanding. Patient escorted via wheelchair. All belongings sent with patients. Hosam Mcfetridge A. Daijanae Rafalski, RN

## 2014-05-26 NOTE — Telephone Encounter (Signed)
On call for inpatient Rehab (Dr Letta Pate):  The pt was d/c'd last night from [facility]. She needs Ramipril, Coreg, Colace  Will call in Rx's (done)

## 2014-05-28 ENCOUNTER — Ambulatory Visit: Payer: Medicare Other | Admitting: Medical

## 2014-05-29 ENCOUNTER — Encounter: Payer: Self-pay | Admitting: Vascular Surgery

## 2014-05-29 NOTE — Progress Notes (Signed)
Social Work Discharge Note  The overall goal for the admission was met for:   Discharge location: Yes - home  Length of Stay: No - pt's stay was complicated by medical complications - workup and treatment for GI bleeding - 17 days  Discharge activity level: Yes - supervision  Home/community participation: Yes  Services provided included: MD, RD, PT, OT, SLP, RN, Pharmacy, Neuropsych and SW  Financial Services: Private Insurance: [place] Medicare Complete  Follow-up services arranged: Home Health: [place] resumed for PT/OT/ST, DME: 18 x 16 lightweight w/c with 18 x 16 basic cushion from [place] (or additional information):  Pt had [facility] [facility] prior to admission, so CSW just resumed their services at d/c.  Pt's family was scheduled for family education twice at their convenience and did not attend either day.  Family was also offered time on the day of d/c to teach them how to bump pt up/down the stairs in the w/c so they could enter/leave the home safely and family did not take advantage of this opportunity.  Patient/Family verbalized understanding of follow-up arrangements: Yes, on admission and via telephone.  CSW never had another face-to-face visit with dtr.  Pt expressed understanding of d/c plan and f/u arrangements.  Individual responsible for coordination of the follow-up plan:  Pt's dtr, Khalilah  Confirmed correct DME delivered: Trey Sailors 05/29/2014    Devondre Guzzetta, Silvestre Mesi

## 2014-05-30 ENCOUNTER — Ambulatory Visit (INDEPENDENT_AMBULATORY_CARE_PROVIDER_SITE_OTHER): Payer: Medicare Other | Admitting: Vascular Surgery

## 2014-05-30 ENCOUNTER — Encounter: Payer: Self-pay | Admitting: Vascular Surgery

## 2014-05-30 VITALS — BP 171/88 | HR 88 | Temp 98.2°F | Resp 18 | Ht 62.0 in | Wt 170.0 lb

## 2014-05-30 DIAGNOSIS — S78111A Complete traumatic amputation at level between right hip and knee, initial encounter: Secondary | ICD-10-CM

## 2014-05-30 DIAGNOSIS — Z89611 Acquired absence of right leg above knee: Secondary | ICD-10-CM

## 2014-05-30 DIAGNOSIS — Z48812 Encounter for surgical aftercare following surgery on the circulatory system: Secondary | ICD-10-CM

## 2014-05-30 DIAGNOSIS — I998 Other disorder of circulatory system: Secondary | ICD-10-CM

## 2014-05-30 NOTE — Patient Instructions (Signed)
Amputee Support Group of [place]  Ongoing Event  See event details for scheduling information. [place], [place] group for amputees, family members and friends. Registration Details For additional information, call Jamey Reas at [phone]. Fees & Payment This support group is free.

## 2014-05-30 NOTE — Progress Notes (Signed)
   Patient name: Traci Mclaughlin MRN: [id number] DOB: Oct 28, 1950 Sex: female  REASON FOR VISIT: follow up after left above-the-knee amputation.  HPI: Traci Mclaughlin is a 63 y.o. female who underwent left above-the-knee amputation on 05/04/2014. She had developed a wound on her left fourth and fifth toes with an extensive infection in her knee and also had a known left iliac artery occlusion. She was not a candidate for revascularization. She underwent left above-the-knee amputation on 05/04/2014. She returns for her one-month follow up visit. Half of her staples were removed in the hospital. Overall she has been doing well.  REVIEW OF SYSTEMS: Valu.Nieves ] denotes positive finding; [  ] denotes negative finding  CARDIOVASCULAR:  [ ]  chest pain   [ ]  dyspnea on exertion    CONSTITUTIONAL:  [ ]  fever   [ ]  chills  PHYSICAL EXAM: Filed Vitals:   05/30/14 0949  BP: 171/88  Pulse: 88  Temp: 98.2 F (36.8 C)  TempSrc: Oral  Resp: 18  Height: 5\' 2"  (1.575 m)  Weight: 170 lb (77.111 kg)  SpO2: 98%   Body mass index is 31.09 kg/(m^2). GENERAL: The patient is a well-nourished female, in no acute distress. The vital signs are documented above. CARDIOVASCULAR: There is a regular rate and rhythm. PULMONARY: There is good air exchange bilaterally without wheezing or rales. Her left above-the-knee amputation is healing nicely and her remaining staples were removed in the office today.  MEDICAL ISSUES:  Unilateral complete AKA Her left AKA is healing nicely. Her remaining staples were removed in the office today. I have given her a prescription for an above-the-knee prosthesis. Hopefully she will be a candidate. We have also given her information on a support group. I will see her back in 6 months to check ABIs on the right foot so hopefully we can avoid amputation on the right in the future.   [facility] [place] Beeper: [phone]

## 2014-05-30 NOTE — Assessment & Plan Note (Signed)
Her left AKA is healing nicely. Her remaining staples were removed in the office today. I have given her a prescription for an above-the-knee prosthesis. Hopefully she will be a candidate. We have also given her information on a support group. I will see her back in 6 months to check ABIs on the right foot so hopefully we can avoid amputation on the right in the future.

## 2014-05-31 ENCOUNTER — Encounter: Payer: Self-pay | Admitting: Internal Medicine

## 2014-06-01 ENCOUNTER — Telehealth: Payer: Self-pay | Admitting: Physical Medicine & Rehabilitation

## 2014-06-01 NOTE — Telephone Encounter (Signed)
Traci Mclaughlin with [place] wanted to let you know patient had a fall on 05/30/14.  Patient fell while using her walker going to the bathroom.  Family member was able to catch her, but she did hit her stump on door and is having a bit of pain from it.  Ebony Hail did notify the Physical Therapist.  Any questions please call her at [phone]

## 2014-06-04 ENCOUNTER — Ambulatory Visit (INDEPENDENT_AMBULATORY_CARE_PROVIDER_SITE_OTHER): Payer: Medicare Other | Admitting: Medical

## 2014-06-04 ENCOUNTER — Encounter: Payer: Self-pay | Admitting: Medical

## 2014-06-04 ENCOUNTER — Telehealth: Payer: Self-pay | Admitting: Medical

## 2014-06-04 VITALS — BP 140/75 | HR 89 | Temp 98.1°F | Ht 62.0 in

## 2014-06-04 DIAGNOSIS — I10 Essential (primary) hypertension: Secondary | ICD-10-CM

## 2014-06-04 DIAGNOSIS — D649 Anemia, unspecified: Secondary | ICD-10-CM

## 2014-06-04 DIAGNOSIS — M79605 Pain in left leg: Secondary | ICD-10-CM

## 2014-06-04 DIAGNOSIS — F4323 Adjustment disorder with mixed anxiety and depressed mood: Secondary | ICD-10-CM

## 2014-06-04 DIAGNOSIS — I739 Peripheral vascular disease, unspecified: Secondary | ICD-10-CM

## 2014-06-04 HISTORY — DX: Anemia, unspecified: D64.9

## 2014-06-04 LAB — IRON AND TIBC
%SAT: 16 % — AB (ref 20–55)
Iron: 26 ug/dL — ABNORMAL LOW (ref 42–145)
TIBC: 165 ug/dL — ABNORMAL LOW (ref 250–470)
UIBC: 139 ug/dL (ref 125–400)

## 2014-06-04 MED ORDER — ALPRAZOLAM 0.25 MG PO TABS: 0.2500 mg | ORAL_TABLET | Freq: Every evening | ORAL | Status: DC | PRN

## 2014-06-04 MED ORDER — DULOXETINE HCL 60 MG PO CPEP: 60.0000 mg | ORAL_CAPSULE | Freq: Every day | ORAL | Status: AC

## 2014-06-04 NOTE — Telephone Encounter (Signed)
Patient is scheduled for a hospital follow up with  Percell Miller and daughter wants her to see an MD.  Would you see this patient

## 2014-06-04 NOTE — Assessment & Plan Note (Signed)
Rx of xanax for when she gets anxious. I did rx cymbalta for some of her pain today. Considering this may help with her mood as well. Will follow and assess response.

## 2014-06-04 NOTE — Progress Notes (Signed)
Subjective:    Patient ID: Traci Mclaughlin, female    DOB: Oct 11, 1950, 63 y.o.   MRN: [id number]  HPI    Pt in for follow up. She was hospitalized for lt above th knee amputation. Pt daughter states that pt gets occasional phantom pain. Pt is not taking perocet on regular basis. Over last week daughter only gave her oxycodone two time. Pt does have a prescription for oxycodone. Daughter states occasionally mom will complain of severe physical pain in her knee.  Pt pain per daughter is mostly emotional now and not physical every day. Daughter reports that daily mom states why me regarding  onset of all her medical problems.  Pt will get anxious and agitated intermittently and then cries.   Pt has been anemic over last 12 days.   Pt is not eating much.   Daughter is stressed out.   Pt 2 weeks ago had some blood in her stools. Pt no longer on xarelto. Stool test in her while hospitalized were negative.   Pt has debucitus ulcer. [facility] home service one time a week. Son who is a Marine scientist is also watching area. Pt refuses to let me see her back.  Pt doe have bp cuff at home.  Past Medical History  Diagnosis Date  . Hypertension   . HOH (hard of hearing)   . CVA (cerebral infarction)   . PAD (peripheral artery disease) 04/10/2014  . Cancer     cervical; was on chemo; has been in remission for 71yrs  . Depression 04/19/2014  . Hx of cardiovascular stress test     Nuclear (9/15):  Inf ischemia, EF 35%; high risk  . Stroke Aug. 10, 2015  . STEMI (ST elevation myocardial infarction) 06/2011  . RBBB (right bundle branch block) 05/02/2014  . Chronic kidney disease     daughter says no,they checked them,okay    History   Social History  . Marital Status: Single    Spouse Name: N/A    Number of Children: N/A  . Years of Education: N/A   Occupational History  . Not on file.   Social History Main Topics  . Smoking status: Former Smoker -- 1.00 packs/day for 1 years    Types:  Cigarettes  . Smokeless tobacco: Former Systems developer    Quit date: 06/16/2011  . Alcohol Use: 3.0 oz/week    5 Glasses of wine per week     Comment: occassional  . Drug Use: No  . Sexual Activity: No   Other Topics Concern  . Not on file   Social History Narrative    Past Surgical History  Procedure Laterality Date  . Cardiac catheterization  07/03/2011    Dr. Irish Lack  . Tee without cardioversion N/A 03/08/2014    Procedure: TRANSESOPHAGEAL ECHOCARDIOGRAM (TEE);  Surgeon: Fay Records, MD;  Location: [facility] ENDOSCOPY;  Service: Cardiovascular;  Laterality: N/A;  . 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 Amputation Left 05/04/2014    Procedure: AMPUTATION ABOVE KNEE;  Surgeon: Angelia Mould, MD;  Location[facility];  Service: Vascular;  Laterality: Left;  . Esophagogastroduodenoscopy (egd) with propofol N/A 05/21/2014    Procedure: ESOPHAGOGASTRODUODENOSCOPY (EGD) WITH PROPOFOL;  Surgeon: Winfield Cunas., MD;  Location: [facility] ENDOSCOPY;  Service: Endoscopy;  Laterality: N/A;  . Colonoscopy with propofol N/A 05/21/2014    Procedure: COLONOSCOPY WITH PROPOFOL;  Surgeon: Winfield Cunas., MD;  Location: [facility]  ENDOSCOPY;  Service: Endoscopy;  Laterality: N/A;    Family History  Problem Relation Age of Onset  . Intracerebral hemorrhage Daughter     during childbirth  . Stroke Daughter   . Heart attack Neg Hx   . Diabetes Mother   . Alcohol abuse Mother   . Lung cancer Father   . COPD Father     No Known Allergies  Current Outpatient Prescriptions on File Prior to Visit  Medication Sig Dispense Refill  . ALPRAZolam (XANAX) 0.25 MG tablet Take 1 tablet (0.25 mg total) by mouth daily after breakfast. 7 tablet 0  . aspirin EC 81 MG EC tablet Take 1 tablet (81 mg total) by mouth daily.    Marland Kitchen atorvastatin (LIPITOR) 40 MG tablet Take 40 mg by mouth daily at 6 PM.    . carvedilol (COREG) 6.25 MG tablet Take 3 tablets (18.75 mg total) by  mouth 2 (two) times daily with a meal. 60 tablet 1  . clopidogrel (PLAVIX) 75 MG tablet Take 1 tablet (75 mg total) by mouth daily. 30 tablet 0  . collagenase (SANTYL) ointment Apply topically daily. Apply to sacrum and cover with damp to dry dressing daily. 15 g 0  . Docusate Sodium (DSS) 100 MG CAPS Take 100 mg by mouth 2 (two) times daily. 60 each 1  . oxyCODONE-acetaminophen (PERCOCET) 10-325 MG per tablet Take 0.5-1 tablets by mouth every 8 (eight) hours as needed for pain. 30 tablet 0  . pantoprazole (PROTONIX) 40 MG tablet Take 1 tablet (40 mg total) by mouth 2 (two) times daily before a meal. 30 tablet 1  . ramipril (ALTACE) 5 MG capsule Take 1 capsule (5 mg total) by mouth daily. 30 capsule 1  . senna-docusate (SENOKOT-S) 8.6-50 MG per tablet Take 2 tablets by mouth 2 (two) times daily. 90 tablet 0  . acetaminophen (TYLENOL) 325 MG tablet Take 1-2 tablets (325-650 mg total) by mouth every 4 (four) hours as needed for mild pain.     No current facility-administered medications on file prior to visit.    BP 140/75 mmHg  Pulse 89  Temp(Src) 98.1 F (36.7 C) (Oral)  Ht 5\' 2"  (1.575 m)  SpO2 100%         Review of Systems  Constitutional: Negative for fever, chills and fatigue.  HENT: Negative.   Respiratory: Negative for choking, chest tightness, shortness of breath and wheezing.   Cardiovascular: Negative for chest pain and palpitations.  Gastrointestinal: Negative.   Genitourinary: Negative.   Musculoskeletal:       Lt leg pain.  Neurological: Negative for dizziness, seizures, syncope, facial asymmetry, weakness, light-headedness, numbness and headaches.  Hematological: Negative for adenopathy. Does not bruise/bleed easily.  Psychiatric/Behavioral: Positive for behavioral problems, dysphoric mood and agitation. Negative for suicidal ideas, hallucinations, confusion, sleep disturbance and decreased concentration. The patient is nervous/anxious. The patient is not  hyperactive.        Objective:   Physical Exam  General- NAD Lungs- CTA Heart- RRR Lt lower ext- above knee amputation. Stump looks clean. No discharge. No redness or warmth. Neurologic- No changes from prior exam. Pt still unable to talk and express herself.       Assessment & Plan:

## 2014-06-04 NOTE — Assessment & Plan Note (Signed)
Check cbc and make sure stable. If not then may need to get stool cards again. Note pt no longer on Xarelto.

## 2014-06-04 NOTE — Assessment & Plan Note (Signed)
Pain from amputation. Some features of phantom pain. Rx cymbalta. Increased dose of cymbalta to 60 mg q day. Pt has oxycodone rx. Advised to give mom the med oxycodone sparingly. Follow up in 2 weeks.

## 2014-06-04 NOTE — Progress Notes (Signed)
Pre visit review using our clinic review tool, if applicable. No additional management support is needed unless otherwise documented below in the visit note. 

## 2014-06-04 NOTE — Assessment & Plan Note (Signed)
Recently had above knee amputation due to severity of disease. Pt daughter tell me mom is now on plavix.

## 2014-06-04 NOTE — Assessment & Plan Note (Signed)
Advised daughter to check mom bp daily. Write down readings. If bp is elevated on average then on follow up will adjust medications. Currently stay on same regimen.

## 2014-06-04 NOTE — Assessment & Plan Note (Signed)
Same deficitis as before. No new deficits.

## 2014-06-04 NOTE — Telephone Encounter (Signed)
Were completely full today, if we have something else during the week sure.

## 2014-06-04 NOTE — Telephone Encounter (Signed)
I apologize my schedule is full

## 2014-06-04 NOTE — Patient Instructions (Addendum)
For your moms stump pain/phantom pain, I will increase your cymbalta to 60 mg and pt will use her current oxycodone sparingly for severe pain.(May in near future give limited number of oxycodone per month)  For your moms  anxiety, I will prescribe xanax to use 1-2 times daily. Currently, your mom is only using one time a day and that is ok. (minimize use if possible)  Please get cbc today to check on anemia. And get anemia panel as well.   Check bp daily at home and document readings.  Follow up in 2 weeks or as needed.

## 2014-06-05 LAB — COMPREHENSIVE METABOLIC PANEL
ALT: 10 U/L (ref 0–35)
AST: 15 U/L (ref 0–37)
Albumin: 2.7 g/dL — ABNORMAL LOW (ref 3.5–5.2)
Alkaline Phosphatase: 55 U/L (ref 39–117)
BUN: 17 mg/dL (ref 6–23)
CO2: 25 mEq/L (ref 19–32)
CREATININE: 1 mg/dL (ref 0.4–1.2)
Calcium: 8.9 mg/dL (ref 8.4–10.5)
Chloride: 99 mEq/L (ref 96–112)
GFR: 76.28 mL/min (ref >60.00)
GLUCOSE: 101 mg/dL — AB (ref 70–99)
Potassium: 3 mEq/L — ABNORMAL LOW (ref 3.5–5.1)
Sodium: 135 mEq/L (ref 135–145)
Total Bilirubin: 0.8 mg/dL (ref 0.2–1.2)
Total Protein: 6.8 g/dL (ref 6.0–8.3)

## 2014-06-05 LAB — CBC WITH DIFFERENTIAL/PLATELET
Basophils Absolute: 0 10*3/uL (ref 0.0–0.1)
Basophils Relative: 0.2 % (ref 0.0–3.0)
EOS PCT: 1.8 % (ref 0.0–5.0)
Eosinophils Absolute: 0.2 10*3/uL (ref 0.0–0.7)
HEMATOCRIT: 29.3 % — AB (ref 36.0–46.0)
HEMOGLOBIN: 9.6 g/dL — AB (ref 12.0–15.0)
Lymphocytes Relative: 10.1 % — ABNORMAL LOW (ref 12.0–46.0)
Lymphs Abs: 1.4 10*3/uL (ref 0.7–4.0)
MCHC: 32.6 g/dL (ref 30.0–36.0)
MCV: 97.2 fl (ref 78.0–100.0)
MONOS PCT: 4.5 % (ref 3.0–12.0)
Monocytes Absolute: 0.6 10*3/uL (ref 0.1–1.0)
NEUTROS ABS: 11.2 10*3/uL — AB (ref 1.4–7.7)
Neutrophils Relative %: 83.4 % — ABNORMAL HIGH (ref 43.0–77.0)
Platelets: 281 10*3/uL (ref 150.0–400.0)
RBC: 3.01 Mil/uL — AB (ref 3.87–5.11)
RDW: 16.1 % — ABNORMAL HIGH (ref 11.5–15.5)
WBC: 13.4 10*3/uL — AB (ref 4.0–10.5)

## 2014-06-05 LAB — FOLATE: Folate: 6.7 ng/mL (ref >5.9)

## 2014-06-05 LAB — VITAMIN B12: Vitamin B-12: 458 pg/mL (ref 211–911)

## 2014-06-05 LAB — FERRITIN: Ferritin: 442.4 ng/mL — ABNORMAL HIGH (ref 10.0–291.0)

## 2014-06-07 ENCOUNTER — Encounter: Payer: Self-pay | Admitting: Medical

## 2014-06-07 ENCOUNTER — Ambulatory Visit (INDEPENDENT_AMBULATORY_CARE_PROVIDER_SITE_OTHER): Payer: Medicare Other | Admitting: Medical

## 2014-06-07 ENCOUNTER — Ambulatory Visit: Payer: Medicare Other | Admitting: Medical

## 2014-06-07 VITALS — BP 150/86 | HR 99 | Temp 98.3°F | Ht 62.0 in

## 2014-06-07 DIAGNOSIS — R197 Diarrhea, unspecified: Secondary | ICD-10-CM

## 2014-06-07 DIAGNOSIS — L8990 Pressure ulcer of unspecified site, unspecified stage: Secondary | ICD-10-CM

## 2014-06-07 DIAGNOSIS — D72829 Elevated white blood cell count, unspecified: Secondary | ICD-10-CM

## 2014-06-07 LAB — CBC WITH DIFFERENTIAL/PLATELET
Basophils Absolute: 0 10*3/uL (ref 0.0–0.1)
Basophils Relative: 0.2 % (ref 0.0–3.0)
Eosinophils Absolute: 0.2 10*3/uL (ref 0.0–0.7)
Eosinophils Relative: 1.6 % (ref 0.0–5.0)
HCT: 29.4 % — ABNORMAL LOW (ref 36.0–46.0)
Hemoglobin: 9.5 g/dL — ABNORMAL LOW (ref 12.0–15.0)
Lymphocytes Relative: 13.6 % (ref 12.0–46.0)
Lymphs Abs: 1.5 10*3/uL (ref 0.7–4.0)
MCHC: 32.4 g/dL (ref 30.0–36.0)
MCV: 96.3 fl (ref 78.0–100.0)
Monocytes Absolute: 0.7 10*3/uL (ref 0.1–1.0)
Monocytes Relative: 6.9 % (ref 3.0–12.0)
Neutro Abs: 8.3 10*3/uL — ABNORMAL HIGH (ref 1.4–7.7)
Neutrophils Relative %: 77.7 % — ABNORMAL HIGH (ref 43.0–77.0)
Platelets: 334 10*3/uL (ref 150.0–400.0)
RBC: 3.05 Mil/uL — ABNORMAL LOW (ref 3.87–5.11)
RDW: 16.3 % — ABNORMAL HIGH (ref 11.5–15.5)
WBC: 10.7 10*3/uL — ABNORMAL HIGH (ref 4.0–10.5)

## 2014-06-07 MED ORDER — METRONIDAZOLE 500 MG PO TABS: 500.0000 mg | ORAL_TABLET | Freq: Three times a day (TID) | ORAL | Status: DC

## 2014-06-07 MED ORDER — FLUCONAZOLE 150 MG PO TABS: 150.0000 mg | ORAL_TABLET | Freq: Once | ORAL | Status: AC

## 2014-06-07 NOTE — Assessment & Plan Note (Signed)
Recheck the cbc/wbc today. Assure not increasing.

## 2014-06-07 NOTE — Assessment & Plan Note (Signed)
Will get stool panel studies. Evaluate culture, o + p, and c dif. Start flagyl and advise immodium ad otc. Stress her meds are not the cause of diarrhea. Encourage hydration otherwise will have to go back to hospital.

## 2014-06-07 NOTE — Progress Notes (Signed)
Pre visit review using our clinic review tool, if applicable. No additional management support is needed unless otherwise documented below in the visit note. 

## 2014-06-07 NOTE — Progress Notes (Signed)
Subjective:    Patient ID: Traci Mclaughlin, female    DOB: January 19, 1951, 63 y.o.   MRN: [id number]  HPI   Pt is in for evaluation of ulcer on her back which she would not let me see last visit. She has elevated white count on labs to follow her anemia. So I wanted her back to check for sources of infection. Recheck her stump left leg and view the ulcer. Look for other sources as well.    Pt is having some diarrhea, about one week now. Pt is using depends now. Pt not wanting to eat or drink but daughter is getting her to drink some protein shake, banana, and orange juice. Pt is refusing to take any of her medications. Pt was on some antibiotics in the hospital. Daughter states that toward end of stay some loose stools. And continues afterwards. Pt per daughter was on antibiotics before and afterwards associated with amputation.  Pt has known depression from all of her medical problems. Pt daughter is worn out from helping.  Past Medical History  Diagnosis Date  . Hypertension   . HOH (hard of hearing)   . CVA (cerebral infarction)   . PAD (peripheral artery disease) 04/10/2014  . Cancer     cervical; was on chemo; has been in remission for 64yrs  . Depression 04/19/2014  . Hx of cardiovascular stress test     Nuclear (9/15):  Inf ischemia, EF 35%; high risk  . Stroke Aug. 10, 2015  . STEMI (ST elevation myocardial infarction) 06/2011  . RBBB (right bundle branch block) 05/02/2014  . Chronic kidney disease     daughter says no,they checked them,okay  . Anemia 06/04/2014    History   Social History  . Marital Status: Single    Spouse Name: N/A    Number of Children: N/A  . Years of Education: N/A   Occupational History  . Not on file.   Social History Main Topics  . Smoking status: Former Smoker -- 1.00 packs/day for 1 years    Types: Cigarettes  . Smokeless tobacco: Former Systems developer    Quit date: 06/16/2011  . Alcohol Use: 3.0 oz/week    5 Glasses of wine per week     Comment:  occassional  . Drug Use: No  . Sexual Activity: No   Other Topics Concern  . Not on file   Social History Narrative    Past Surgical History  Procedure Laterality Date  . Cardiac catheterization  07/03/2011    Dr. Irish Lack  . Tee without cardioversion N/A 03/08/2014    Procedure: TRANSESOPHAGEAL ECHOCARDIOGRAM (TEE);  Surgeon: Fay Records, MD;  Location: [facility] ENDOSCOPY;  Service: Cardiovascular;  Laterality: N/A;  . 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 Esophagogastroduodenoscopy (egd) with propofol N/A 05/21/2014    Procedure: ESOPHAGOGASTRODUODENOSCOPY (EGD) WITH PROPOFOL;  Surgeon: Winfield Cunas., MD;  Location: [facility] ENDOSCOPY;  Service: Endoscopy;  Laterality: N/A;  . Colonoscopy with propofol N/A 05/21/2014    Procedure: COLONOSCOPY WITH PROPOFOL;  Surgeon: Winfield Cunas., MD;  Location: [facility] ENDOSCOPY;  Service: Endoscopy;  Laterality: N/A;    Family History  Problem Relation Age of Onset  . Intracerebral hemorrhage Daughter     during childbirth  .  Stroke Daughter   . Heart attack Neg Hx   . Diabetes Mother   . Alcohol abuse Mother   . Lung cancer Father   . COPD Father     No Known Allergies  Current Outpatient Prescriptions on File Prior to Visit  Medication Sig Dispense Refill  . acetaminophen (TYLENOL) 325 MG tablet Take 1-2 tablets (325-650 mg total) by mouth every 4 (four) hours as needed for mild pain.    Marland Kitchen ALPRAZolam (XANAX) 0.25 MG tablet Take 1 tablet (0.25 mg total) by mouth daily after breakfast. 7 tablet 0  . ALPRAZolam (XANAX) 0.25 MG tablet Take 1 tablet (0.25 mg total) by mouth at bedtime as needed for anxiety. 30 tablet 0  . aspirin EC 81 MG EC tablet Take 1 tablet (81 mg total) by mouth daily.    Marland Kitchen atorvastatin (LIPITOR) 40 MG  tablet Take 40 mg by mouth daily at 6 PM.    . carvedilol (COREG) 6.25 MG tablet Take 3 tablets (18.75 mg total) by mouth 2 (two) times daily with a meal. 60 tablet 1  . clopidogrel (PLAVIX) 75 MG tablet Take 1 tablet (75 mg total) by mouth daily. 30 tablet 0  . collagenase (SANTYL) ointment Apply topically daily. Apply to sacrum and cover with damp to dry dressing daily. 15 g 0  . Docusate Sodium (DSS) 100 MG CAPS Take 100 mg by mouth 2 (two) times daily. 60 each 1  . DULoxetine (CYMBALTA) 60 MG capsule Take 1 capsule (60 mg total) by mouth daily. 30 capsule 3  . oxyCODONE-acetaminophen (PERCOCET) 10-325 MG per tablet Take 0.5-1 tablets by mouth every 8 (eight) hours as needed for pain. 30 tablet 0  . pantoprazole (PROTONIX) 40 MG tablet Take 1 tablet (40 mg total) by mouth 2 (two) times daily before a meal. 30 tablet 1  . ramipril (ALTACE) 5 MG capsule Take 1 capsule (5 mg total) by mouth daily. 30 capsule 1  . senna-docusate (SENOKOT-S) 8.6-50 MG per tablet Take 2 tablets by mouth 2 (two) times daily. 90 tablet 0   No current facility-administered medications on file prior to visit.    BP 150/86 mmHg  Pulse 99  Temp(Src) 98.3 F (36.8 C) (Oral)  Ht 5\' 2"  (1.575 m)  Wt   SpO2 96%      Review of Systems  Constitutional: Negative for fever, chills and fatigue.  HENT: Negative.   Respiratory: Negative for cough, chest tightness, shortness of breath and wheezing.   Cardiovascular: Negative for chest pain and palpitations.  Gastrointestinal: Positive for nausea and diarrhea. Negative for vomiting, abdominal pain, constipation, blood in stool, abdominal distention, anal bleeding and rectal pain.  Genitourinary: Negative.   Musculoskeletal:       Lt leg phantom pain.  Skin:       Small ulcer at sacral dimple region above buttox.  Neurological:       No new complaints.  Hematological: Negative for adenopathy. Does not bruise/bleed easily.  Psychiatric/Behavioral: Positive for  dysphoric mood. Negative for suicidal ideas, hallucinations, behavioral problems, confusion, sleep disturbance and agitation. The patient is nervous/anxious.        Objective:   Physical Exam  General- no acute distress.  Lungs- CTA Heart- RRR Abdomen- soft, nontender, + Bs, No rebound or guarding.  Back- no cva tenderness. Left leg- above the knee stump. Wound looks clean. No infectonl Small ulcer sacral dimple/buttox region. 10 mm.(Clean but did culture for work up since wbc elevated)  Assessment & Plan:

## 2014-06-07 NOTE — Assessment & Plan Note (Signed)
Continue wound care weekly. Culture of wound today.

## 2014-06-07 NOTE — Patient Instructions (Signed)
By review and exam, I think your mom may have elevated white count from c.dif. Recent antibiotics can cause c dif. I want you to do stool studies today/asap.  For your wound on upper buttox area I did a wound culture. Continue with wound care since this area actually looks pretty good.  I will start you on flagyl pending stool studies.  I do want to repeat your cbc today.  Follow up in 7 days or as needed.

## 2014-06-08 ENCOUNTER — Telehealth: Payer: Self-pay | Admitting: *Deleted

## 2014-06-08 NOTE — Telephone Encounter (Signed)
Nurse report from [employer] (Art gallery manager) Pt stats:   BP:    168/109      P: 68       O2sat:    98%     R:    20     T:    98.69F   Pt  Has elevated BP, daughter stated that pt is fighting taking her meds and eating, diarrhea has increased, daughter is going to get anti-diarrheal meadication. Pt claims to have no appetite, she states she doesn't feel hungry, nurse asks if appetite enhancers be prescribed?

## 2014-06-08 NOTE — Telephone Encounter (Signed)
Megace 400mg  liquid bid,  one month supply, 1 RF

## 2014-06-10 LAB — WOUND CULTURE
GRAM STAIN: NONE SEEN
Gram Stain: NONE SEEN
PRELIMINARY REPORT: NORMAL

## 2014-06-11 MED ORDER — MEGESTROL ACETATE 40 MG/ML PO SUSP: 400.0000 mg | Freq: Two times a day (BID) | ORAL | Status: AC

## 2014-06-11 NOTE — Telephone Encounter (Signed)
Order placed.  It looks like it may be something we will get a prior auth request in future.[place] notified.

## 2014-06-14 ENCOUNTER — Ambulatory Visit: Payer: Medicare Other | Admitting: Medical

## 2014-06-15 ENCOUNTER — Encounter: Payer: Self-pay | Admitting: Cardiology

## 2014-06-15 ENCOUNTER — Telehealth: Payer: Self-pay | Admitting: Cardiology

## 2014-06-15 NOTE — Telephone Encounter (Signed)
-----   Message from Patsey Berthold, RN sent at 06/10/2014  6:52 PM EST ----- Needs phone call/letter; no transmissions since 04-30-14

## 2014-06-15 NOTE — Telephone Encounter (Signed)
LMOVM informing pt to send manual transmission from home monitor for loop recorder b/c home moniotor has become disconnected. This generally happens when there has been a power outage, monitor is unplugged, or pt is away from monitor for couple days / weeks at a time. Will mail letter today as well.

## 2014-06-18 ENCOUNTER — Ambulatory Visit: Payer: Medicare Other | Admitting: Family Medicine

## 2014-06-18 ENCOUNTER — Encounter (HOSPITAL_COMMUNITY): Payer: Self-pay | Admitting: Family Medicine

## 2014-06-18 ENCOUNTER — Inpatient Hospital Stay (HOSPITAL_COMMUNITY)
Admission: EM | Admit: 2014-06-18 | Discharge: 2014-06-20 | DRG: 683 | Disposition: A | Discharge status: Home / Self Care | Payer: Medicare Other | Attending: Family Medicine | Admitting: Family Medicine

## 2014-06-18 ENCOUNTER — Emergency Department (HOSPITAL_COMMUNITY): Payer: Medicare Other

## 2014-06-18 DIAGNOSIS — K626 Ulcer of anus and rectum: Secondary | ICD-10-CM | POA: Diagnosis present

## 2014-06-18 DIAGNOSIS — H9190 Unspecified hearing loss, unspecified ear: Secondary | ICD-10-CM | POA: Diagnosis present

## 2014-06-18 DIAGNOSIS — I129 Hypertensive chronic kidney disease with stage 1 through stage 4 chronic kidney disease, or unspecified chronic kidney disease: Secondary | ICD-10-CM | POA: Diagnosis present

## 2014-06-18 DIAGNOSIS — L8940 Pressure ulcer of contiguous site of back, buttock and hip, unspecified stage: Secondary | ICD-10-CM | POA: Diagnosis present

## 2014-06-18 DIAGNOSIS — Z87891 Personal history of nicotine dependence: Secondary | ICD-10-CM | POA: Diagnosis not present

## 2014-06-18 DIAGNOSIS — D649 Anemia, unspecified: Secondary | ICD-10-CM | POA: Diagnosis present

## 2014-06-18 DIAGNOSIS — R739 Hyperglycemia, unspecified: Secondary | ICD-10-CM | POA: Diagnosis present

## 2014-06-18 DIAGNOSIS — K5790 Diverticulosis of intestine, part unspecified, without perforation or abscess without bleeding: Secondary | ICD-10-CM | POA: Diagnosis present

## 2014-06-18 DIAGNOSIS — A047 Enterocolitis due to Clostridium difficile: Secondary | ICD-10-CM | POA: Diagnosis present

## 2014-06-18 DIAGNOSIS — N183 Chronic kidney disease, stage 3 (moderate): Secondary | ICD-10-CM | POA: Diagnosis present

## 2014-06-18 DIAGNOSIS — E876 Hypokalemia: Secondary | ICD-10-CM | POA: Diagnosis present

## 2014-06-18 DIAGNOSIS — K529 Noninfective gastroenteritis and colitis, unspecified: Secondary | ICD-10-CM | POA: Diagnosis present

## 2014-06-18 DIAGNOSIS — K6289 Other specified diseases of anus and rectum: Secondary | ICD-10-CM | POA: Diagnosis present

## 2014-06-18 DIAGNOSIS — G8191 Hemiplegia, unspecified affecting right dominant side: Secondary | ICD-10-CM | POA: Diagnosis present

## 2014-06-18 DIAGNOSIS — I2510 Atherosclerotic heart disease of native coronary artery without angina pectoris: Secondary | ICD-10-CM | POA: Diagnosis present

## 2014-06-18 DIAGNOSIS — D72829 Elevated white blood cell count, unspecified: Secondary | ICD-10-CM | POA: Diagnosis present

## 2014-06-18 DIAGNOSIS — K5900 Constipation, unspecified: Secondary | ICD-10-CM | POA: Diagnosis present

## 2014-06-18 DIAGNOSIS — Z8673 Personal history of transient ischemic attack (TIA), and cerebral infarction without residual deficits: Secondary | ICD-10-CM | POA: Diagnosis not present

## 2014-06-18 DIAGNOSIS — I252 Old myocardial infarction: Secondary | ICD-10-CM | POA: Diagnosis not present

## 2014-06-18 DIAGNOSIS — F32A Depression, unspecified: Secondary | ICD-10-CM | POA: Diagnosis present

## 2014-06-18 DIAGNOSIS — F4323 Adjustment disorder with mixed anxiety and depressed mood: Secondary | ICD-10-CM | POA: Diagnosis present

## 2014-06-18 DIAGNOSIS — Z9119 Patient's noncompliance with other medical treatment and regimen: Secondary | ICD-10-CM | POA: Diagnosis present

## 2014-06-18 DIAGNOSIS — E785 Hyperlipidemia, unspecified: Secondary | ICD-10-CM | POA: Diagnosis present

## 2014-06-18 DIAGNOSIS — Z7982 Long term (current) use of aspirin: Secondary | ICD-10-CM

## 2014-06-18 DIAGNOSIS — K625 Hemorrhage of anus and rectum: Secondary | ICD-10-CM

## 2014-06-18 DIAGNOSIS — I4510 Unspecified right bundle-branch block: Secondary | ICD-10-CM | POA: Diagnosis present

## 2014-06-18 DIAGNOSIS — Z89612 Acquired absence of left leg above knee: Secondary | ICD-10-CM

## 2014-06-18 DIAGNOSIS — I10 Essential (primary) hypertension: Secondary | ICD-10-CM | POA: Diagnosis present

## 2014-06-18 DIAGNOSIS — I639 Cerebral infarction, unspecified: Secondary | ICD-10-CM | POA: Diagnosis present

## 2014-06-18 DIAGNOSIS — F329 Major depressive disorder, single episode, unspecified: Secondary | ICD-10-CM | POA: Diagnosis present

## 2014-06-18 DIAGNOSIS — K219 Gastro-esophageal reflux disease without esophagitis: Secondary | ICD-10-CM | POA: Diagnosis present

## 2014-06-18 DIAGNOSIS — I739 Peripheral vascular disease, unspecified: Secondary | ICD-10-CM | POA: Diagnosis present

## 2014-06-18 DIAGNOSIS — R197 Diarrhea, unspecified: Secondary | ICD-10-CM | POA: Diagnosis present

## 2014-06-18 HISTORY — DX: Diverticulosis of intestine, part unspecified, without perforation or abscess without bleeding: K57.90

## 2014-06-18 HISTORY — DX: Ulcer of anus and rectum: K62.6

## 2014-06-18 HISTORY — DX: Other hemorrhoids: K64.8

## 2014-06-18 LAB — COMPREHENSIVE METABOLIC PANEL
ALK PHOS: 62 U/L (ref 39–117)
ALT: 13 U/L (ref 0–35)
ANION GAP: 12 (ref 5–15)
AST: 19 U/L (ref 0–37)
Albumin: 3 g/dL — ABNORMAL LOW (ref 3.5–5.2)
BUN: 11 mg/dL (ref 6–23)
CO2: 25 meq/L (ref 19–32)
Calcium: 9.4 mg/dL (ref 8.4–10.5)
Chloride: 102 mEq/L (ref 96–112)
Creatinine, Ser: 0.87 mg/dL (ref 0.50–1.10)
GFR, EST AFRICAN AMERICAN: 80 mL/min — AB (ref >90)
GFR, EST NON AFRICAN AMERICAN: 69 mL/min — AB (ref >90)
Glucose, Bld: 117 mg/dL — ABNORMAL HIGH (ref 70–99)
POTASSIUM: 3.9 meq/L (ref 3.7–5.3)
SODIUM: 139 meq/L (ref 137–147)
Total Bilirubin: 0.3 mg/dL (ref 0.3–1.2)
Total Protein: 6.8 g/dL (ref 6.0–8.3)

## 2014-06-18 LAB — URINALYSIS, ROUTINE W REFLEX MICROSCOPIC
Bilirubin Urine: NEGATIVE
GLUCOSE, UA: NEGATIVE mg/dL
HGB URINE DIPSTICK: NEGATIVE
Ketones, ur: NEGATIVE mg/dL
Leukocytes, UA: NEGATIVE
Nitrite: NEGATIVE
PH: 6.5 (ref 5.0–8.0)
Protein, ur: NEGATIVE mg/dL
Specific Gravity, Urine: 1.018 (ref 1.005–1.030)
Urobilinogen, UA: 1 mg/dL (ref 0.0–1.0)

## 2014-06-18 LAB — CBC
HEMATOCRIT: 32.1 % — AB (ref 36.0–46.0)
Hemoglobin: 10.3 g/dL — ABNORMAL LOW (ref 12.0–15.0)
MCH: 30.8 pg (ref 26.0–34.0)
MCHC: 32.1 g/dL (ref 30.0–36.0)
MCV: 96.1 fL (ref 78.0–100.0)
Platelets: 382 10*3/uL (ref 150–400)
RBC: 3.34 MIL/uL — AB (ref 3.87–5.11)
RDW: 16.2 % — ABNORMAL HIGH (ref 11.5–15.5)
WBC: 7.3 10*3/uL (ref 4.0–10.5)

## 2014-06-18 LAB — LACTIC ACID, PLASMA: Lactic Acid, Venous: 1.3 mmol/L (ref 0.5–2.2)

## 2014-06-18 LAB — ABO/RH: ABO/RH(D): A POS

## 2014-06-18 LAB — TSH: TSH: 1.2 u[IU]/mL (ref 0.350–4.500)

## 2014-06-18 LAB — TYPE AND SCREEN
ABO/RH(D): A POS
Antibody Screen: NEGATIVE

## 2014-06-18 LAB — POC OCCULT BLOOD, ED: Fecal Occult Bld: POSITIVE — AB

## 2014-06-18 MED ORDER — ONDANSETRON HCL 4 MG/2ML IJ SOLN: 4.0000 mg | Freq: Four times a day (QID) | INTRAMUSCULAR | Status: DC | PRN

## 2014-06-18 MED ORDER — RAMIPRIL 5 MG PO CAPS
5.0000 mg | ORAL_CAPSULE | Freq: Every day | ORAL | Status: DC
  Administered 2014-06-18 – 2014-06-20 (×3): 5 mg via ORAL
  Filled 2014-06-18 (×3): qty 1

## 2014-06-18 MED ORDER — ADULT MULTIVITAMIN W/MINERALS CH
1.0000 | ORAL_TABLET | Freq: Every day | ORAL | Status: DC
  Administered 2014-06-18 – 2014-06-20 (×3): 1 via ORAL
  Filled 2014-06-18 (×3): qty 1

## 2014-06-18 MED ORDER — OXYCODONE HCL 5 MG PO TABS: 5.0000 mg | ORAL_TABLET | ORAL | Status: DC | PRN

## 2014-06-18 MED ORDER — PANTOPRAZOLE SODIUM 40 MG PO TBEC
40.0000 mg | DELAYED_RELEASE_TABLET | Freq: Two times a day (BID) | ORAL | Status: DC
  Administered 2014-06-19 – 2014-06-20 (×4): 40 mg via ORAL
  Filled 2014-06-18 (×4): qty 1

## 2014-06-18 MED ORDER — COLLAGENASE 250 UNIT/GM EX OINT
1.0000 "application " | TOPICAL_OINTMENT | Freq: Every day | CUTANEOUS | Status: DC | PRN
  Filled 2014-06-18: qty 30

## 2014-06-18 MED ORDER — ALPRAZOLAM 0.25 MG PO TABS: 0.2500 mg | ORAL_TABLET | Freq: Every evening | ORAL | Status: DC | PRN

## 2014-06-18 MED ORDER — ATORVASTATIN CALCIUM 40 MG PO TABS
40.0000 mg | ORAL_TABLET | Freq: Every day | ORAL | Status: DC
  Administered 2014-06-19 – 2014-06-20 (×2): 40 mg via ORAL
  Filled 2014-06-18 (×2): qty 1

## 2014-06-18 MED ORDER — CARVEDILOL 6.25 MG PO TABS
18.7500 mg | ORAL_TABLET | Freq: Two times a day (BID) | ORAL | Status: DC
  Administered 2014-06-19 – 2014-06-20 (×4): 18.75 mg via ORAL
  Filled 2014-06-18 (×5): qty 1

## 2014-06-18 MED ORDER — SODIUM CHLORIDE 0.9 % IV SOLN
INTRAVENOUS | Status: DC
  Administered 2014-06-18 – 2014-06-20 (×3): via INTRAVENOUS

## 2014-06-18 MED ORDER — VANCOMYCIN 50 MG/ML ORAL SOLUTION
250.0000 mg | Freq: Four times a day (QID) | ORAL | Status: DC
Start: 2014-06-18 — End: 2014-06-20
  Administered 2014-06-18 – 2014-06-20 (×8): 250 mg via ORAL
  Filled 2014-06-18 (×10): qty 5

## 2014-06-18 MED ORDER — DULOXETINE HCL 60 MG PO CPEP
60.0000 mg | ORAL_CAPSULE | Freq: Every day | ORAL | Status: DC
  Administered 2014-06-19 – 2014-06-20 (×2): 60 mg via ORAL
  Filled 2014-06-18 (×3): qty 1

## 2014-06-18 MED ORDER — MEGESTROL ACETATE 40 MG/ML PO SUSP
400.0000 mg | Freq: Two times a day (BID) | ORAL | Status: DC
  Administered 2014-06-18 – 2014-06-20 (×4): 400 mg via ORAL
  Filled 2014-06-18 (×5): qty 10

## 2014-06-18 MED ORDER — PIPERACILLIN-TAZOBACTAM 3.375 G IVPB
3.3750 g | Freq: Three times a day (TID) | INTRAVENOUS | Status: DC
  Administered 2014-06-18 – 2014-06-20 (×5): 3.375 g via INTRAVENOUS
  Filled 2014-06-18 (×8): qty 50

## 2014-06-18 MED ORDER — VITAMIN B-1 100 MG PO TABS
100.0000 mg | ORAL_TABLET | Freq: Every day | ORAL | Status: DC
  Administered 2014-06-18 – 2014-06-20 (×3): 100 mg via ORAL
  Filled 2014-06-18 (×3): qty 1

## 2014-06-18 MED ORDER — METRONIDAZOLE IN NACL 5-0.79 MG/ML-% IV SOLN: 500.0000 mg | Freq: Once | INTRAVENOUS | Status: DC

## 2014-06-18 MED ORDER — CIPROFLOXACIN IN D5W 400 MG/200ML IV SOLN
400.0000 mg | Freq: Once | INTRAVENOUS | Status: AC
  Administered 2014-06-18: 400 mg via INTRAVENOUS
  Filled 2014-06-18: qty 200

## 2014-06-18 MED ORDER — IOHEXOL 300 MG/ML  SOLN
100.0000 mL | Freq: Once | INTRAMUSCULAR | Status: AC | PRN
  Administered 2014-06-18: 100 mL via INTRAVENOUS

## 2014-06-18 MED ORDER — MORPHINE SULFATE 2 MG/ML IJ SOLN: 1.0000 mg | INTRAMUSCULAR | Status: DC | PRN

## 2014-06-18 MED ORDER — ACETAMINOPHEN 325 MG PO TABS: 650.0000 mg | ORAL_TABLET | Freq: Four times a day (QID) | ORAL | Status: DC | PRN

## 2014-06-18 MED ORDER — SODIUM CHLORIDE 0.9 % IV SOLN
INTRAVENOUS | Status: DC
  Administered 2014-06-18: 14:00:00 via INTRAVENOUS

## 2014-06-18 MED ORDER — FOLIC ACID 1 MG PO TABS
1.0000 mg | ORAL_TABLET | Freq: Every day | ORAL | Status: DC
  Administered 2014-06-18 – 2014-06-20 (×3): 1 mg via ORAL
  Filled 2014-06-18 (×3): qty 1

## 2014-06-18 MED ORDER — ACETAMINOPHEN 650 MG RE SUPP: 650.0000 mg | Freq: Four times a day (QID) | RECTAL | Status: DC | PRN

## 2014-06-18 MED ORDER — IOHEXOL 300 MG/ML  SOLN
25.0000 mL | INTRAMUSCULAR | Status: AC
  Administered 2014-06-18: 25 mL via ORAL

## 2014-06-18 MED ORDER — ONDANSETRON HCL 4 MG PO TABS: 4.0000 mg | ORAL_TABLET | Freq: Four times a day (QID) | ORAL | Status: DC | PRN

## 2014-06-18 NOTE — ED Provider Notes (Signed)
Patient's care assumed from outgoing providers. On repeat exam the patient is awake and alert, and I communicated, via written message to her the CT results. Given the patient's bleeding, concern for colitis/proctitis, and the possibility of C. difficile, patient received IV antibiotics, was admitted for further evaluation and management. The patient's daughter was not present, but is expected to return, and will be informed of results at that point.  Traci Muskrat, MD 06/18/14 [phone]

## 2014-06-18 NOTE — Progress Notes (Signed)
ANTIBIOTIC CONSULT NOTE - INITIAL  Pharmacy Consult for cipro Indication: intra-abdominal infection  No Known Allergies  Patient Measurements: Wt= 77.1kg   Vital Signs: Temp: 99.1 F (37.3 C) (11/23 2027) Temp Source: Oral (11/23 1306) BP: 161/92 mmHg (11/23 2027) Pulse Rate: 95 (11/23 2027) Intake/Output from previous day:   Intake/Output from this shift:    Labs:  Recent Labs  06/18/14 1322  WBC 7.3  HGB 10.3*  PLT 382  CREATININE 0.87   CrCl cannot be calculated (Unknown ideal weight.). No results for input(s): VANCOTROUGH, VANCOPEAK, VANCORANDOM, GENTTROUGH, GENTPEAK, GENTRANDOM, TOBRATROUGH, TOBRAPEAK, TOBRARND, AMIKACINPEAK, AMIKACINTROU, AMIKACIN in the last 72 hours.   Microbiology: Recent Results (from the past 720 hour(s))  Wound culture     Status: None   Collection Time: 06/07/14  1:01 PM  Result Value Ref Range Status   Gram Stain No WBC Seen  Final   Gram Stain No Squamous Epithelial Cells Seen  Final   Gram Stain Rare Gram Positive Cocci In Pairs  Final   Preliminary Report Normal Oropharyngeal Flora  Final    Comment: No Staphylococcus aureus isolated NO GROUP A STREP (S. PYOGENES) ISOLATED     Medical History: Past Medical History  Diagnosis Date  . Hypertension   . HOH (hard of hearing)   . CVA (cerebral infarction)   . PAD (peripheral artery disease) 04/10/2014  . Cancer     cervical; was on chemo; has been in remission for 86yrs  . Depression 04/19/2014  . Hx of cardiovascular stress test     Nuclear (9/15):  Inf ischemia, EF 35%; high risk  . Stroke Aug. 10, 2015  . STEMI (ST elevation myocardial infarction) 06/2011  . RBBB (right bundle branch block) 05/02/2014  . Chronic kidney disease     daughter says no,they checked them,okay  . Anemia 06/04/2014  . Rectal ulcer     likely due to constipation  . Diverticulosis   . Internal hemorrhoid     Medications:  Prescriptions prior to admission  Medication Sig Dispense Refill Last  Dose  . ALPRAZolam (XANAX) 0.25 MG tablet Take 0.25 mg by mouth at bedtime as needed for anxiety.   06/17/2014 at Unknown time  . aspirin EC 81 MG EC tablet Take 1 tablet (81 mg total) by mouth daily.   06/17/2014 at Unknown time  . atorvastatin (LIPITOR) 40 MG tablet Take 40 mg by mouth daily at 6 PM.   06/17/2014 at Unknown time  . carvedilol (COREG) 6.25 MG tablet Take 3 tablets (18.75 mg total) by mouth 2 (two) times daily with a meal. 60 tablet 1 06/17/2014 at 1800  . clopidogrel (PLAVIX) 75 MG tablet Take 1 tablet (75 mg total) by mouth daily. 30 tablet 0 06/17/2014 at Unknown time  . collagenase (SANTYL) ointment Apply topically daily. Apply to sacrum and cover with damp to dry dressing daily. (Patient taking differently: Apply 1 application topically daily as needed (bed sores). Apply to sacrum and cover with damp to dry dressing daily.) 15 g 0 Past Week at Unknown time  . Docusate Sodium (DSS) 100 MG CAPS Take 100 mg by mouth 2 (two) times daily. (Patient taking differently: Take 100 mg by mouth 2 (two) times daily as needed (constipation). ) 60 each 1 Past Month at Unknown time  . DULoxetine (CYMBALTA) 60 MG capsule Take 1 capsule (60 mg total) by mouth daily. 30 capsule 3 06/17/2014 at Unknown time  . metroNIDAZOLE (FLAGYL) 500 MG tablet Take 1 tablet (500 mg  total) by mouth 3 (three) times daily. 21 tablet 0 06/17/2014 at Unknown time  . pantoprazole (PROTONIX) 40 MG tablet Take 1 tablet (40 mg total) by mouth 2 (two) times daily before a meal. 30 tablet 1 06/17/2014 at Unknown time  . ramipril (ALTACE) 5 MG capsule Take 1 capsule (5 mg total) by mouth daily. 30 capsule 1 06/17/2014 at Unknown time  . acetaminophen (TYLENOL) 325 MG tablet Take 1-2 tablets (325-650 mg total) by mouth every 4 (four) hours as needed for mild pain. (Patient not taking: Reported on 06/18/2014)   Taking  . ALPRAZolam (XANAX) 0.25 MG tablet Take 1 tablet (0.25 mg total) by mouth daily after breakfast. 7 tablet 0    . ALPRAZolam (XANAX) 0.25 MG tablet Take 1 tablet (0.25 mg total) by mouth at bedtime as needed for anxiety. 30 tablet 0   . fluconazole (DIFLUCAN) 150 MG tablet Take 1 tablet (150 mg total) by mouth once. (Patient not taking: Reported on 06/18/2014) 1 tablet 0 Not Taking at Unknown time  . megestrol (MEGACE) 40 MG/ML suspension Take 10 mLs (400 mg total) by mouth 2 (two) times daily. (Patient not taking: Reported on 06/18/2014) 240 mL 1 Not Taking at Unknown time  . oxyCODONE-acetaminophen (PERCOCET) 10-325 MG per tablet Take 0.5-1 tablets by mouth every 8 (eight) hours as needed for pain. 30 tablet 0 Taking  . senna-docusate (SENOKOT-S) 8.6-50 MG per tablet Take 2 tablets by mouth 2 (two) times daily. 90 tablet 0 Taking   Scheduled:  . [START ON 06/19/2014] atorvastatin  40 mg Oral q1800  . [START ON 06/19/2014] carvedilol  18.75 mg Oral BID WC  . [START ON 06/19/2014] DULoxetine  60 mg Oral Daily  . folic acid  1 mg Oral Daily  . megestrol  400 mg Oral BID  . multivitamin with minerals  1 tablet Oral Daily  . [START ON 06/19/2014] pantoprazole  40 mg Oral BID AC  . ramipril  5 mg Oral Daily  . thiamine  100 mg Oral Daily  . vancomycin  250 mg Oral Q6H    Assessment: 63 yo female with here with rectal bleeding and with possible infective colitis to begin Zosyn. WBC= 7.3, SCr= 0.87 and CrCl ~70.   11/23 zosyn>>  11/23 urine  Plan:  -Zosyn 3.375gm IV q8h -Will follow renal function, cultures and clinical progress  Hildred Laser, Pharm D 06/18/2014 9:21 PM

## 2014-06-18 NOTE — ED Notes (Signed)
Megan in [place] notified that patient has completed oral contrast.

## 2014-06-18 NOTE — H&P (Signed)
[facility] History and Physical  [name]:[id number] DOB: 1951-01-03 DOA: 06/18/2014  Referring physician: Carmin Muskrat, MD PCP: [name], PA-C   Chief Complaint: Rectal Bleeding  HPI: Traci Mclaughlin is a 63 y.o. female presents with rectal bleeding. Patient comes to the ER with bright red blood from her rectum. She has had ongoing issues with skin ulcers and has been treated as an outpatient for her wound care. Patient states that she has noted diarrhea also. She has no abdominal pain. She has no fever noted. She has no vomiting noted. She denies having chest pain. In the ED she had  CT scan of the abdomen and this shows presence of colitis of the distal sigmoid and also has proctitis. The appearance is suggestive of C Diff. She has been on antibiotics also recently.   Review of Systems:  Patient is not able to provide ROS  Past Medical History  Diagnosis Date  . Hypertension   . HOH (hard of hearing)   . CVA (cerebral infarction)   . PAD (peripheral artery disease) 04/10/2014  . Cancer     cervical; was on chemo; has been in remission for 1yrs  . Depression 04/19/2014  . Hx of cardiovascular stress test     Nuclear (9/15):  Inf ischemia, EF 35%; high risk  . Stroke Aug. 10, 2015  . STEMI (ST elevation myocardial infarction) 06/2011  . RBBB (right bundle branch block) 05/02/2014  . Chronic kidney disease     daughter says no,they checked them,okay  . Anemia 06/04/2014  . Rectal ulcer     likely due to constipation  . Diverticulosis   . Internal hemorrhoid    Past Surgical History  Procedure Laterality Date  . Cardiac catheterization  07/03/2011    Dr. Irish Lack  . Tee without cardioversion N/A 03/08/2014    Procedure: TRANSESOPHAGEAL ECHOCARDIOGRAM (TEE);  Surgeon: Fay Records, MD;  Location: [facility] ENDOSCOPY;  Service: Cardiovascular;  Laterality: N/A;  . 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 Esophagogastroduodenoscopy (egd) with propofol N/A 05/21/2014    Procedure: ESOPHAGOGASTRODUODENOSCOPY (EGD) WITH PROPOFOL;  Surgeon: Winfield Cunas., MD;  Location: [facility] ENDOSCOPY;  Service: Endoscopy;  Laterality: N/A;  . Colonoscopy with propofol N/A 05/21/2014    Procedure: COLONOSCOPY WITH PROPOFOL;  Surgeon: Winfield Cunas., MD;  Location: [facility] ENDOSCOPY;  Service: Endoscopy;  Laterality: N/A;  . Vena cava filter placement  04/2014   Social History:  reports that she has quit smoking. Her smoking use included Cigarettes. She has a 1 pack-year smoking history. She quit smokeless tobacco use about 3 years ago. She reports that she drinks about 3.0 oz of alcohol per week. She reports that she does not use illicit drugs.  No Known Allergies  Family History  Problem Relation Age of Onset  . Intracerebral hemorrhage Daughter     during childbirth  . Stroke Daughter   . Heart attack Neg Hx   . Diabetes Mother   . Alcohol abuse Mother   . Lung cancer Father   . COPD Father      Prior to Admission medications   Medication Sig Start Date End Date Taking? Authorizing Provider  ALPRAZolam Duanne Moron) 0.25 MG tablet Take 0.25 mg by mouth at bedtime  as needed for anxiety.   Yes Historical Provider, MD  aspirin EC 81 MG EC tablet Take 1 tablet (81 mg total) by mouth daily. 05/25/14  Yes Ivan Anchors Love, PA-C  atorvastatin (LIPITOR) 40 MG tablet Take 40 mg by mouth daily at 6 PM. 03/23/14  Yes Ivan Anchors Love, PA-C  carvedilol (COREG) 6.25 MG tablet Take 3 tablets (18.75 mg total) by mouth 2 (two) times daily with a meal. 05/26/14  Yes Aleksei Plotnikov V, MD  clopidogrel (PLAVIX) 75 MG tablet Take 1 tablet (75 mg total) by mouth daily. 05/25/14  Yes Ivan Anchors Love, PA-C  collagenase (SANTYL) ointment Apply topically daily. Apply to sacrum  and cover with damp to dry dressing daily. Patient taking differently: Apply 1 application topically daily as needed (bed sores). Apply to sacrum and cover with damp to dry dressing daily. 05/25/14  Yes Ivan Anchors Love, PA-C  Docusate Sodium (DSS) 100 MG CAPS Take 100 mg by mouth 2 (two) times daily. Patient taking differently: Take 100 mg by mouth 2 (two) times daily as needed (constipation).  05/26/14  Yes Aleksei Plotnikov V, MD  DULoxetine (CYMBALTA) 60 MG capsule Take 1 capsule (60 mg total) by mouth daily. 06/04/14  Yes Meriam Sprague Saguier, PA-C  metroNIDAZOLE (FLAGYL) 500 MG tablet Take 1 tablet (500 mg total) by mouth 3 (three) times daily. 06/07/14  Yes Meriam Sprague Saguier, PA-C  pantoprazole (PROTONIX) 40 MG tablet Take 1 tablet (40 mg total) by mouth 2 (two) times daily before a meal. 05/25/14  Yes Ivan Anchors Love, PA-C  ramipril (ALTACE) 5 MG capsule Take 1 capsule (5 mg total) by mouth daily. 05/26/14  Yes Aleksei Plotnikov V, MD  acetaminophen (TYLENOL) 325 MG tablet Take 1-2 tablets (325-650 mg total) by mouth every 4 (four) hours as needed for mild pain. Patient not taking: Reported on 06/18/2014 05/25/14   Ivan Anchors Love, PA-C  ALPRAZolam Duanne Moron) 0.25 MG tablet Take 1 tablet (0.25 mg total) by mouth daily after breakfast. 05/25/14   Ivan Anchors Love, PA-C  ALPRAZolam Duanne Moron) 0.25 MG tablet Take 1 tablet (0.25 mg total) by mouth at bedtime as needed for anxiety. 06/04/14   Meriam Sprague Saguier, PA-C  fluconazole (DIFLUCAN) 150 MG tablet Take 1 tablet (150 mg total) by mouth once. Patient not taking: Reported on 06/18/2014 06/07/14   Meriam Sprague Saguier, PA-C  megestrol (MEGACE) 40 MG/ML suspension Take 10 mLs (400 mg total) by mouth 2 (two) times daily. Patient not taking: Reported on 06/18/2014 06/11/14   Meredith Staggers, MD  oxyCODONE-acetaminophen (PERCOCET) 10-325 MG per tablet Take 0.5-1 tablets by mouth every 8 (eight) hours as needed for pain. 05/25/14   Ivan Anchors Love, PA-C  senna-docusate  (SENOKOT-S) 8.6-50 MG per tablet Take 2 tablets by mouth 2 (two) times daily. 05/25/14   Bary Leriche, PA-C   Physical Exam: Filed Vitals:   06/18/14 1730 06/18/14 1800 06/18/14 1805 06/18/14 1830  BP: 145/86 150/87 150/87 154/78  Pulse:  100 104   Temp:      TempSrc:      Resp: 16 20 20 19   SpO2: 95% 98% 98% 99%    Wt Readings from Last 3 Encounters:  05/30/14 77.111 kg (170 lb)  05/23/14 73.5 kg (162 lb 0.6 oz)  05/06/14 78.4 kg (172 lb 13.5 oz)    General:  Appears calm and comfortable Eyes: PERRL, normal lids, irises & conjunctiva ENT: hard of hearing, normal ips & tongue Neck: no LAD, masses or thyromegaly  Cardiovascular: RRR, no m/r/g. ++LE edema. Respiratory: CTA bilaterally, no w/r/r. Normal respiratory effort. Abdomen: soft, ntnd Skin: no rash or induration seen on limited exam Musculoskeletal: left leg amputation. RLE normal Psychiatric: appears anxious Neurologic: grossly non-focal.          Labs on Admission:  Basic Metabolic Panel:  Recent Labs Lab 06/18/14 1322  NA 139  K 3.9  CL 102  CO2 25  GLUCOSE 117*  BUN 11  CREATININE 0.87  CALCIUM 9.4   Liver Function Tests:  Recent Labs Lab 06/18/14 1322  AST 19  ALT 13  ALKPHOS 62  BILITOT 0.3  PROT 6.8  ALBUMIN 3.0*   No results for input(s): LIPASE, AMYLASE in the last 168 hours. No results for input(s): AMMONIA in the last 168 hours. CBC:  Recent Labs Lab 06/18/14 1322  WBC 7.3  HGB 10.3*  HCT 32.1*  MCV 96.1  PLT 382   Cardiac Enzymes: No results for input(s): CKTOTAL, CKMB, CKMBINDEX, TROPONINI in the last 168 hours.  BNP (last 3 results) No results for input(s): PROBNP in the last 8760 hours. CBG: No results for input(s): GLUCAP in the last 168 hours.  Radiological Exams on Admission: Ct Abdomen Pelvis W Contrast  06/18/2014   CLINICAL DATA:  Rectal bleeding that began yesterday. Initial encounter.  EXAM: CT ABDOMEN AND PELVIS WITH CONTRAST  TECHNIQUE: Multidetector CT  imaging of the abdomen and pelvis was performed using the standard protocol following bolus administration of intravenous contrast.  CONTRAST:  178mL OMNIPAQUE IOHEXOL 300 MG/ML  SOLN  COMPARISON:  None.  FINDINGS: Musculoskeletal: No aggressive osseous lesions. L5-S1 predominant lumbar spondylosis. Lower lumbar facet arthrosis accounts for anterolisthesis of L4 on L5. Anterolisthesis is grade I.  Lung Bases: Atelectasis.  Liver: Small low-density lesions in the LEFT and RIGHT hepatic lobes anteriorly compatible with cysts.  Spleen:  Normal.  Gallbladder:  Distended.  Normal.  Common bile duct:  Normal.  Pancreas:  Normal.  Adrenal glands:  Normal bilaterally.  Kidneys: RIGHT upper pole renal scarring. Normal enhancement. Normal delayed excretion of contrast. Both ureters appear within normal limits.  Stomach:  Partially collapsed.  Small bowel: Duodenum is normal. Small bowel is within normal limits. No small bowel obstruction. No mesenteric adenopathy or mass lesions.  Colon: Normal appendix. Mild mural thickening of the ascending colon is apparently due to underdistention. Descending colonic diverticulosis. The rectosigmoid is inflamed, with colitis. Mural thickening measures up to 9 mm. The ischiorectal fossa shows stranding secondary to colitis and proctitis. Proximal sigmoid appears within normal limits.  Pelvic Genitourinary: Uterine atrophy, expected for age. Small calcified fibroid. Urinary bladder appears within normal limits.  Vasculature: Atherosclerosis. No acute vascular abnormality. Mural plaque in the infrarenal abdominal aorta. IVC filter present, with low-attenuation clot centrally in the IVC filter. Portal vein is patent. No mesenteric thrombosis.  Body Wall: Normal.  IMPRESSION: 1. Distal sigmoid colitis and proctitis. Primary consideration is infection, including C difficile colitis. Inflammatory bowel disease or ischemia are unlikely in this location. 2. Small hepatic cysts. 3. IVC filter with  contained clot centrally. 4. Atherosclerosis.   Electronically Signed   By: Dereck Ligas M.D.   On: 06/18/2014 17:03      Assessment/Plan Principal Problem:   Colitis Active Problems:   Stroke   Hypertension   Depression   Anemia   Diarrhea   1. Colitis/Rectal Bleeding -CT results show distal sigmoid colitis suggestive of infectious process -she was recently started on flagyl will start on oral vancocin  since she has not improved with flagyl -will also start on cipro IV -check stool C DIff  2. Hypertension -continue with home medications -monitor pressures  3. Anemia -hemoglobin 10.3 -will recheck labs -check iron studies  4. Diarrhea -may be related to colitis -treat as above  5. Stroke -she is stable at this time   Code Status: Full Code (must indicate code status--if unknown or must be presumed, indicate so) DVT Prophylaxis:TEDS high risk for bleeding Family Communication: No Family present (indicate person spoken with, if applicable, with phone number if by telephone) Disposition Plan: HOme (indicate anticipated LOS)  Time spent: 24min  Anicia Leuthold A [facility] Pager [phone]

## 2014-06-18 NOTE — ED Notes (Signed)
Per pt sts rectal bleeding that started yesterday. Denies any pain. Pt crying at triage.

## 2014-06-18 NOTE — Plan of Care (Signed)
Problem: Consults Goal: General Medical Patient Education See Patient Education Module for specific education. Outcome: Completed/Met Date Met:  06/18/14 Goal: Skin Care Protocol Initiated - if Braden Score 18 or less If consults are not indicated, leave blank or document N/A Outcome: Completed/Met Date Met:  06/18/14 Goal: Nutrition Consult-if indicated Outcome: Completed/Met Date Met:  06/18/14 Goal: Diabetes Guidelines if Diabetic/Glucose > 140 If diabetic or lab glucose is > 140 mg/dl - Initiate Diabetes/Hyperglycemia Guidelines & Document Interventions  Outcome: Completed/Met Date Met:  06/18/14  Problem: Phase I Progression Outcomes Goal: Pain controlled with appropriate interventions Outcome: Completed/Met Date Met:  06/18/14 Goal: Voiding-avoid urinary catheter unless indicated Outcome: Completed/Met Date Met:  06/18/14

## 2014-06-18 NOTE — Progress Notes (Signed)
Pt arrived to unit alert and oriented x4. Oriented to room, unit, and staff.  Bed in lowest position and call bell is within reach. Will continue to monitor. 

## 2014-06-18 NOTE — ED Notes (Signed)
Attempted report x1. 

## 2014-06-18 NOTE — ED Notes (Signed)
Dr.Lockwood at bedside  

## 2014-06-18 NOTE — ED Notes (Signed)
Pt to CT at this time.

## 2014-06-18 NOTE — ED Provider Notes (Signed)
CSN: [id number]     Arrival date & time 06/18/14  1302 History   First MD Initiated Contact with Patient 06/18/14 1321     Chief Complaint  Patient presents with  . Rectal Bleeding      Patient is a 63 y.o. female presenting with hematochezia. The history is provided by the patient, a relative and a caregiver. The history is limited by the condition of the patient [facility], cognitive issues).  Rectal Bleeding Pt was seen at 1325. Per pt's caregiver and pt: c/o sudden onset and persistence of constant "rectal bleeding" for the past 2 days. Pt states she has the bleeding when she has a bowel movement as well as passes urine. Denies hematuria/dysuria, no abd pain, no back pain, no N/V/D, no fevers, no CP/SOB. Pt's family states pt had a similar episode when she was hospitalized last month, but she was on xarelto at the time (she was taken off of it then).    Past Medical History  Diagnosis Date  . Hypertension   . HOH (hard of hearing)   . CVA (cerebral infarction)   . PAD (peripheral artery disease) 04/10/2014  . Cancer     cervical; was on chemo; has been in remission for 12yrs  . Depression 04/19/2014  . Hx of cardiovascular stress test     Nuclear (9/15):  Inf ischemia, EF 35%; high risk  . Stroke Aug. 10, 2015  . STEMI (ST elevation myocardial infarction) 06/2011  . RBBB (right bundle branch block) 05/02/2014  . Chronic kidney disease     daughter says no,they checked them,okay  . Anemia 06/04/2014  . Rectal ulcer     likely due to constipation  . Diverticulosis   . Internal hemorrhoid    Past Surgical History  Procedure Laterality Date  . Cardiac catheterization  07/03/2011    Dr. Irish Lack  . Tee without cardioversion N/A 03/08/2014    Procedure: TRANSESOPHAGEAL ECHOCARDIOGRAM (TEE);  Surgeon: Fay Records, MD;  Location: [facility] ENDOSCOPY;  Service: Cardiovascular;  Laterality: N/A;  . 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 Esophagogastroduodenoscopy (egd) with propofol N/A 05/21/2014    Procedure: ESOPHAGOGASTRODUODENOSCOPY (EGD) WITH PROPOFOL;  Surgeon: Winfield Cunas., MD;  Location: [facility] ENDOSCOPY;  Service: Endoscopy;  Laterality: N/A;  . Colonoscopy with propofol N/A 05/21/2014    Procedure: COLONOSCOPY WITH PROPOFOL;  Surgeon: Winfield Cunas., MD;  Location: [facility] ENDOSCOPY;  Service: Endoscopy;  Laterality: N/A;   Family History  Problem Relation Age of Onset  . Intracerebral hemorrhage Daughter     during childbirth  . Stroke Daughter   . Heart attack Neg Hx   . Diabetes Mother   . Alcohol abuse Mother   . Lung cancer Father   . COPD Father    History  Substance Use Topics  . Smoking status: Former Smoker -- 1.00 packs/day for 1 years    Types: Cigarettes  . Smokeless tobacco: Former Systems developer    Quit date: 06/16/2011  . Alcohol Use: 3.0 oz/week    5 Glasses of wine per week     Comment: occassional    Review of Systems  Unable to perform ROS: Other  Gastrointestinal: Positive for hematochezia.      Allergies  Review of patient's  allergies indicates no known allergies.  Home Medications   Prior to Admission medications   Medication Sig Start Date End Date Taking? Authorizing Provider  ALPRAZolam Duanne Moron) 0.25 MG tablet Take 0.25 mg by mouth at bedtime as needed for anxiety.   Yes Historical Provider, MD  aspirin EC 81 MG EC tablet Take 1 tablet (81 mg total) by mouth daily. 05/25/14  Yes Ivan Anchors Love, PA-C  atorvastatin (LIPITOR) 40 MG tablet Take 40 mg by mouth daily at 6 PM. 03/23/14  Yes Ivan Anchors Love, PA-C  carvedilol (COREG) 6.25 MG tablet Take 3 tablets (18.75 mg total) by mouth 2 (two) times daily with a meal. 05/26/14  Yes Aleksei Plotnikov V, MD  clopidogrel (PLAVIX) 75 MG tablet Take 1 tablet (75 mg total) by mouth  daily. 05/25/14  Yes Ivan Anchors Love, PA-C  collagenase (SANTYL) ointment Apply topically daily. Apply to sacrum and cover with damp to dry dressing daily. Patient taking differently: Apply 1 application topically daily as needed (bed sores). Apply to sacrum and cover with damp to dry dressing daily. 05/25/14  Yes Ivan Anchors Love, PA-C  Docusate Sodium (DSS) 100 MG CAPS Take 100 mg by mouth 2 (two) times daily. Patient taking differently: Take 100 mg by mouth 2 (two) times daily as needed (constipation).  05/26/14  Yes Aleksei Plotnikov V, MD  DULoxetine (CYMBALTA) 60 MG capsule Take 1 capsule (60 mg total) by mouth daily. 06/04/14  Yes Meriam Sprague Saguier, PA-C  metroNIDAZOLE (FLAGYL) 500 MG tablet Take 1 tablet (500 mg total) by mouth 3 (three) times daily. 06/07/14  Yes Meriam Sprague Saguier, PA-C  pantoprazole (PROTONIX) 40 MG tablet Take 1 tablet (40 mg total) by mouth 2 (two) times daily before a meal. 05/25/14  Yes Ivan Anchors Love, PA-C  ramipril (ALTACE) 5 MG capsule Take 1 capsule (5 mg total) by mouth daily. 05/26/14  Yes Aleksei Plotnikov V, MD  acetaminophen (TYLENOL) 325 MG tablet Take 1-2 tablets (325-650 mg total) by mouth every 4 (four) hours as needed for mild pain. Patient not taking: Reported on 06/18/2014 05/25/14   Ivan Anchors Love, PA-C  ALPRAZolam Duanne Moron) 0.25 MG tablet Take 1 tablet (0.25 mg total) by mouth daily after breakfast. 05/25/14   Ivan Anchors Love, PA-C  ALPRAZolam Duanne Moron) 0.25 MG tablet Take 1 tablet (0.25 mg total) by mouth at bedtime as needed for anxiety. 06/04/14   Meriam Sprague Saguier, PA-C  fluconazole (DIFLUCAN) 150 MG tablet Take 1 tablet (150 mg total) by mouth once. Patient not taking: Reported on 06/18/2014 06/07/14   Meriam Sprague Saguier, PA-C  megestrol (MEGACE) 40 MG/ML suspension Take 10 mLs (400 mg total) by mouth 2 (two) times daily. Patient not taking: Reported on 06/18/2014 06/11/14   Meredith Staggers, MD  oxyCODONE-acetaminophen (PERCOCET) 10-325 MG per tablet Take 0.5-1  tablets by mouth every 8 (eight) hours as needed for pain. 05/25/14   Ivan Anchors Love, PA-C  senna-docusate (SENOKOT-S) 8.6-50 MG per tablet Take 2 tablets by mouth 2 (two) times daily. 05/25/14   Ivan Anchors Love, PA-C   BP 144/78 mmHg  Pulse 83  Temp(Src) 97.9 F (36.6 C) (Oral)  Resp 15  SpO2 99% Physical Exam  1330: Physical examination:  Nursing notes reviewed; Vital signs and O2 SAT reviewed;  Constitutional: Well developed, Well nourished, In no acute distress; Head:  Normocephalic, atraumatic; Eyes: EOMI, PERRL, No scleral icterus; ENMT: Mouth and pharynx normal, Mucous membranes dry; Neck: Supple, Full range of motion, No lymphadenopathy; Cardiovascular: Regular  rate and rhythm, No gallop; Respiratory: Breath sounds clear & equal bilaterally, No wheezes.  Speaking full sentences with ease, Normal respiratory effort/excursion; Chest: Nontender, Movement normal; Abdomen: Soft, Nontender, Nondistended, Normal bowel sounds. Rectal exam performed w/permission of pt and ED RN chaperone present.  Anal tone normal.  Non-tender, soft brown stool in rectal vault, heme positive.  No fissures, no external hemorrhoids, no palp masses.;; Genitourinary: No CVA tenderness; Extremities: Pulses normal, No tenderness, +left AKA.; Neuro: Awake, alert, confused and emotionally labile, per baseline. +HOH, otherwise major CN grossly intact.  Speech clear. Moves all extremities on stretcher without apparent gross focal motor deficits.; Skin: Color normal, Warm, Dry.    ED Course  Procedures     EKG Interpretation None      MDM  MDM Reviewed: previous chart, nursing note and vitals Reviewed previous: labs Interpretation: labs and CT scan      Results for orders placed or performed during the hospital encounter of 06/18/14  CBC  Result Value Ref Range   WBC 7.3 4.0 - 10.5 K/uL   RBC 3.34 (L) 3.87 - 5.11 MIL/uL   Hemoglobin 10.3 (L) 12.0 - 15.0 g/dL   HCT 32.1 (L) 36.0 - 46.0 %   MCV 96.1 78.0 -  100.0 fL   MCH 30.8 26.0 - 34.0 pg   MCHC 32.1 30.0 - 36.0 g/dL   RDW 16.2 (H) 11.5 - 15.5 %   Platelets 382 150 - 400 K/uL  Comprehensive metabolic panel  Result Value Ref Range   Sodium 139 137 - 147 mEq/L   Potassium 3.9 3.7 - 5.3 mEq/L   Chloride 102 96 - 112 mEq/L   CO2 25 19 - 32 mEq/L   Glucose, Bld 117 (H) 70 - 99 mg/dL   BUN 11 6 - 23 mg/dL   Creatinine, Ser 0.87 0.50 - 1.10 mg/dL   Calcium 9.4 8.4 - 10.5 mg/dL   Total Protein 6.8 6.0 - 8.3 g/dL   Albumin 3.0 (L) 3.5 - 5.2 g/dL   AST 19 0 - 37 U/L   ALT 13 0 - 35 U/L   Alkaline Phosphatase 62 39 - 117 U/L   Total Bilirubin 0.3 0.3 - 1.2 mg/dL   GFR calc non Af Amer 69 (L) >90 mL/min   GFR calc Af Amer 80 (L) >90 mL/min   Anion gap 12 5 - 15  Lactic acid, plasma  Result Value Ref Range   Lactic Acid, Venous 1.3 0.5 - 2.2 mmol/L  Urinalysis, Routine w reflex microscopic  Result Value Ref Range   Color, Urine YELLOW YELLOW   APPearance CLEAR CLEAR   Specific Gravity, Urine 1.018 1.005 - 1.030   pH 6.5 5.0 - 8.0   Glucose, UA NEGATIVE NEGATIVE mg/dL   Hgb urine dipstick NEGATIVE NEGATIVE   Bilirubin Urine NEGATIVE NEGATIVE   Ketones, ur NEGATIVE NEGATIVE mg/dL   Protein, ur NEGATIVE NEGATIVE mg/dL   Urobilinogen, UA 1.0 0.0 - 1.0 mg/dL   Nitrite NEGATIVE NEGATIVE   Leukocytes, UA NEGATIVE NEGATIVE  POC occult blood, ED  Result Value Ref Range   Fecal Occult Bld POSITIVE (A) NEGATIVE  Type and screen  Result Value Ref Range   ABO/RH(D) A POS    Antibody Screen NEG    Sample Expiration 06/21/2014   ABO/Rh  Result Value Ref Range   ABO/RH(D) A POS     1615:  Pt has not had N/V or stooling while in the ED. VS remain stable. CT A/P pending. Sign  out to Dr. Vanita Panda.     Francine Graven, DO 06/18/14 1622

## 2014-06-19 ENCOUNTER — Encounter: Payer: Medicare Other | Admitting: Interventional Cardiology

## 2014-06-19 DIAGNOSIS — F329 Major depressive disorder, single episode, unspecified: Secondary | ICD-10-CM

## 2014-06-19 DIAGNOSIS — K625 Hemorrhage of anus and rectum: Secondary | ICD-10-CM

## 2014-06-19 LAB — COMPREHENSIVE METABOLIC PANEL
ALT: 11 U/L (ref 0–35)
AST: 14 U/L (ref 0–37)
Albumin: 2.6 g/dL — ABNORMAL LOW (ref 3.5–5.2)
Alkaline Phosphatase: 58 U/L (ref 39–117)
Anion gap: 13 (ref 5–15)
BUN: 7 mg/dL (ref 6–23)
CALCIUM: 9 mg/dL (ref 8.4–10.5)
CO2: 25 meq/L (ref 19–32)
CREATININE: 0.85 mg/dL (ref 0.50–1.10)
Chloride: 104 mEq/L (ref 96–112)
GFR, EST AFRICAN AMERICAN: 83 mL/min — AB (ref >90)
GFR, EST NON AFRICAN AMERICAN: 71 mL/min — AB (ref >90)
Glucose, Bld: 127 mg/dL — ABNORMAL HIGH (ref 70–99)
Potassium: 3.5 mEq/L — ABNORMAL LOW (ref 3.7–5.3)
Sodium: 142 mEq/L (ref 137–147)
Total Bilirubin: 0.3 mg/dL (ref 0.3–1.2)
Total Protein: 6.3 g/dL (ref 6.0–8.3)

## 2014-06-19 LAB — CBC
HCT: 30.6 % — ABNORMAL LOW (ref 36.0–46.0)
HEMOGLOBIN: 10 g/dL — AB (ref 12.0–15.0)
MCH: 31 pg (ref 26.0–34.0)
MCHC: 32.7 g/dL (ref 30.0–36.0)
MCV: 94.7 fL (ref 78.0–100.0)
PLATELETS: 327 10*3/uL (ref 150–400)
RBC: 3.23 MIL/uL — AB (ref 3.87–5.11)
RDW: 16.1 % — ABNORMAL HIGH (ref 11.5–15.5)
WBC: 10.8 10*3/uL — ABNORMAL HIGH (ref 4.0–10.5)

## 2014-06-19 LAB — HEMOGLOBIN A1C
HEMOGLOBIN A1C: 5.3 % (ref <5.7)
MEAN PLASMA GLUCOSE: 105 mg/dL (ref <117)

## 2014-06-19 LAB — GLUCOSE, CAPILLARY: GLUCOSE-CAPILLARY: 117 mg/dL — AB (ref 70–99)

## 2014-06-19 MED ORDER — POTASSIUM CHLORIDE CRYS ER 20 MEQ PO TBCR
40.0000 meq | EXTENDED_RELEASE_TABLET | Freq: Once | ORAL | Status: AC
  Administered 2014-06-19: 40 meq via ORAL
  Filled 2014-06-19: qty 2

## 2014-06-19 MED ORDER — FERROUS SULFATE 325 (65 FE) MG PO TABS
325.0000 mg | ORAL_TABLET | Freq: Every day | ORAL | Status: DC
  Administered 2014-06-20: 325 mg via ORAL
  Filled 2014-06-19 (×2): qty 1

## 2014-06-19 NOTE — Progress Notes (Signed)
Pt BP 167/75. Tat, MD notified. No new orders at this time.

## 2014-06-19 NOTE — Care Management Note (Signed)
    Page 1 of 2   06/20/2014     3:29:40 PM CARE MANAGEMENT NOTE 06/20/2014  Patient:  [facility]   Account Number:  [id number]  Date Initiated:  06/19/2014  Documentation initiated by:  Tomi Bamberger  Subjective/Objective Assessment:   dx colitis  admit- lives with daughter  Active with [facility] for [facility], PT, [place], OT will resume.     Action/Plan:   Anticipated DC Date:  06/20/2014   Anticipated DC Plan:  [place]  CM consult      [facility] Choice  HOME HEALTH  Resumption Of Svcs/PTA Provider   Choice offered to / List presented to:  C-1 Patient        [place] arranged  HH-2 PT  HH-1 RN  [place].   Status of service:  Completed, signed off Medicare Important Message given?  YES (If response is "NO", the following Medicare IM given date fields will be blank) Date Medicare IM given:  06/19/2014 Medicare IM given by:  Tomi Bamberger Date Additional Medicare IM given:   Additional Medicare IM given by:    Discharge Disposition:  [place]  Per UR Regulation:  Reviewed for med. necessity/level of care/duration of stay  If discussed at [place] of Stay Meetings, dates discussed:    Comments:  06/20/14 [place], BSN [phone] patient is active with [facility] for [facility], PT, [place], and St, will resume services.  06/19/14 [place] BSN [phone] patient lives with daughter.  NCM will continue to follow for dc needs.

## 2014-06-19 NOTE — Progress Notes (Signed)
[facility] PROGRESS NOTE  Traci Mclaughlin WCB:[id number] DOB: 1951-01-09 DOA: 06/18/2014 PCP: [name], PA-C  Assessment/Plan:  Colitis/proctitis -leukocytosis 10.8, afebrile -CT abd/pelvis-distal colitis and proctitis likely due to C diff, unlikely ischemia/IBD; small hepatic cysts -C diff pending -Placed on  IV Zosyn (day 2)    Diarrhea -Recent antibitoic use after amputation 10/15 -C diff pending -Placed on oral Vancomycin (day 1) -Recently evaluated for diarrhea and placed on Flagyl in outpatient 06/07/14  Rectal bleed -Likely secondary to rectal ulcers/ constipation as evidenced on previous colonscopy -+FOBT -EGD/Colonoscopy (10/15)- normal EGD, colonoscopy with large ulcers in rectum likely due to constipation, large internal hemorrhoids, few diverticuli, no evidence of malignancy  Leukocytosis -WBC 10.8 -Likely due to colitis -CXR- no acute abnormalities -UA negative -Urine cxs pending  Hypokalemia -Kdur 14meq once -Follow BMET in am  Above Knee Amputation, 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 -Normal healing with no signs of infection -Continues with phantom pain -PT/OT eval pending  Decubitus Ulcers -small ulcer on buttox -Continue wound care -Topical collagenase -Pain meds PRN -Wound consult  Hypertension -BP elevated -Continue Coreg 18.75mg  BID  Hyperglycemia -CBG 120s -Hgb A1C 5.8 -CBG monitoring  CAD -Denies chest pain  CKD, Stage III -Creatinine normal 0.85 -Monitor BMET in am  Normocytic Anemia -Hgb 10.8- likely secondary to CKD -Anemia panel with low iron, normal folic acid -Will add iron supplements daily, continue folic acid daily -monitor CBC in am  Hyperlipidemia -TSH normal -Lipitor 40 mg daily  Hx of CVA -Deficits of right hemipaglegia -On Plavix and ASA-   Adjustment disorder with mixed anxiety and depression -Cymbalta 60mg  daily -Xanax 0/25mg  daily   Chronic constipation -Continue Miralax PRN  GERD -Protonix 40 mg  daily    DVT Prophylaxis:  TED hose Code Status: Full Family Communication: No family at bedside, attempted to call Mayer Camel, at [phone], with no answer.   Disposition Plan: Inpatient   Consultants:  none  Procedures:  None  Antibiotics: Oral Vancomycin -11/24>> IV Zosyn-11/24>>> IV Ciprofloxacin 11/23-23  HPI/Subjective: Traci Mclaughlin is a 63 yo female that is hard of hearing with PMH GI bleed (EGD/colonscopy 10/15 with rectal ulcers likely due to constipation), Decubitus ulcer, CVA  (8/15-deficits of right hemiparesis), PAD ( s/p left AKA 10/15), CAD that presents with rectal bleeding. Patient is hard of hearing and is hard to communicate with.  Attempted to call daughter, but unable to get to to her. Information obtained from the chart. Patient has had ongoing issues with rectal bleeding and diarrhea. Recent colonscopy with ulcers from constipation, and she is placed on Miralax. Recently evaluated for diarrhea and placed on oral Flagyl. She has been on antibiotics after AKA of left leg.  She denies abdominal pain, chest pain, or sob.  In ED, Ct abd/pelvis with evidence of colitis and proctitis, likely due to C diff. Cdiff pending. She is admitted for further management.   Objective: Filed Vitals:   06/19/14 0440  BP: 167/75  Pulse: 93  Temp: 98.7 F (37.1 C)  Resp: 18    Intake/Output Summary (Last 24 hours) at 06/19/14 0757 Last data filed at 06/19/14 0657  Gross per 24 hour  Intake   1825 ml  Output      0 ml  Net   1825 ml   Filed Weights   06/18/14 2025  Weight: 69.7 kg (153 lb 10.6 oz)    Exam:  Gen: Alert and oriented, in no acute distress  HEENT: Normocephalic, atraumatic.  Pupils symmertrical.  Moist mucosa.   Chest:  clear to auscultate bilaterally, no ronchi or rales  Cardiac: Regular rate and rhythm, S1-S2, no rubs murmurs or gallops  Abdomen: soft, non tender, non distended, +bowel sounds. No guarding or rigidity   Extremities: Symmetrical in appearance without cyanosis or edema. Left leg- AKA. Normal healing w/o evidence of infection. Small ulcer in buttox region Neurological: Alert awake oriented to time place and person.  Psychiatric: Appears normal.   Data Reviewed: Basic Metabolic Panel:  Recent Labs Lab 06/18/14 1322 06/19/14 0556  NA 139 142  K 3.9 3.5*  CL 102 104  CO2 25 25  GLUCOSE 117* 127*  BUN 11 7  CREATININE 0.87 0.85  CALCIUM 9.4 9.0   Liver Function Tests:  Recent Labs Lab 06/18/14 1322 06/19/14 0556  AST 19 14  ALT 13 11  ALKPHOS 62 58  BILITOT 0.3 0.3  PROT 6.8 6.3  ALBUMIN 3.0* 2.6*   No results for input(s): LIPASE, AMYLASE in the last 168 hours. No results for input(s): AMMONIA in the last 168 hours. CBC:  Recent Labs Lab 06/18/14 1322 06/19/14 0556  WBC 7.3 10.8*  HGB 10.3* 10.0*  HCT 32.1* 30.6*  MCV 96.1 94.7  PLT 382 327   Cardiac Enzymes: No results for input(s): CKTOTAL, CKMB, CKMBINDEX, TROPONINI in the last 168 hours. BNP (last 3 results) No results for input(s): PROBNP in the last 8760 hours. CBG: No results for input(s): GLUCAP in the last 168 hours.  No results found for this or any previous visit (from the past 240 hour(s)).   Studies: Ct Abdomen Pelvis W Contrast  06/18/2014   CLINICAL DATA:  Rectal bleeding that began yesterday. Initial encounter.  EXAM: CT ABDOMEN AND PELVIS WITH CONTRAST  TECHNIQUE: Multidetector CT imaging of the abdomen and pelvis was performed using the standard protocol following bolus administration of intravenous contrast.  CONTRAST:  180mL OMNIPAQUE IOHEXOL 300 MG/ML  SOLN  COMPARISON:  None.  FINDINGS: Musculoskeletal: No aggressive osseous lesions. L5-S1 predominant lumbar spondylosis. Lower lumbar facet arthrosis accounts for anterolisthesis of L4 on L5. Anterolisthesis is grade I.  Lung Bases: Atelectasis.  Liver: Small low-density lesions in the LEFT and RIGHT hepatic lobes anteriorly compatible with  cysts.  Spleen:  Normal.  Gallbladder:  Distended.  Normal.  Common bile duct:  Normal.  Pancreas:  Normal.  Adrenal glands:  Normal bilaterally.  Kidneys: RIGHT upper pole renal scarring. Normal enhancement. Normal delayed excretion of contrast. Both ureters appear within normal limits.  Stomach:  Partially collapsed.  Small bowel: Duodenum is normal. Small bowel is within normal limits. No small bowel obstruction. No mesenteric adenopathy or mass lesions.  Colon: Normal appendix. Mild mural thickening of the ascending colon is apparently due to underdistention. Descending colonic diverticulosis. The rectosigmoid is inflamed, with colitis. Mural thickening measures up to 9 mm. The ischiorectal fossa shows stranding secondary to colitis and proctitis. Proximal sigmoid appears within normal limits.  Pelvic Genitourinary: Uterine atrophy, expected for age. Small calcified fibroid. Urinary bladder appears within normal limits.  Vasculature: Atherosclerosis. No acute vascular abnormality. Mural plaque in the infrarenal abdominal aorta. IVC filter present, with low-attenuation clot centrally in the IVC filter. Portal vein is patent. No mesenteric thrombosis.  Body Wall: Normal.  IMPRESSION: 1. Distal sigmoid colitis and proctitis. Primary consideration is infection, including C difficile colitis. Inflammatory bowel disease or ischemia are unlikely in this location. 2. Small hepatic cysts. 3. IVC filter with contained clot centrally. 4. Atherosclerosis.   Electronically Signed   By: Arvilla Market.D.  On: 06/18/2014 17:03    Scheduled Meds: . atorvastatin  40 mg Oral q1800  . carvedilol  18.75 mg Oral BID WC  . DULoxetine  60 mg Oral Daily  . folic acid  1 mg Oral Daily  . megestrol  400 mg Oral BID  . multivitamin with minerals  1 tablet Oral Daily  . pantoprazole  40 mg Oral BID AC  . piperacillin-tazobactam (ZOSYN)  IV  3.375 g Intravenous Q8H  . ramipril  5 mg Oral Daily  . thiamine  100 mg Oral Daily   . vancomycin  250 mg Oral Q6H   Continuous Infusions: . sodium chloride 75 mL/hr at 06/18/14 2117    Principal Problem:   Colitis Active Problems:   Stroke   Hypertension   Depression   Anemia   Diarrhea    Time spent: [place], [place] [facility]  [facility] Pager [phone]. If 7PM-7AM, please contact night-coverage at [email or web address], password [email or web address] 06/19/2014, 7:57 AM  LOS: 1 day

## 2014-06-20 ENCOUNTER — Telehealth: Payer: Self-pay | Admitting: *Deleted

## 2014-06-20 LAB — BASIC METABOLIC PANEL
ANION GAP: 14 (ref 5–15)
BUN: 8 mg/dL (ref 6–23)
CALCIUM: 8.3 mg/dL — AB (ref 8.4–10.5)
CO2: 20 mEq/L (ref 19–32)
CREATININE: 0.84 mg/dL (ref 0.50–1.10)
Chloride: 103 mEq/L (ref 96–112)
GFR calc non Af Amer: 72 mL/min — ABNORMAL LOW (ref >90)
GFR, EST AFRICAN AMERICAN: 84 mL/min — AB (ref >90)
Glucose, Bld: 159 mg/dL — ABNORMAL HIGH (ref 70–99)
Potassium: 3.1 mEq/L — ABNORMAL LOW (ref 3.7–5.3)
Sodium: 137 mEq/L (ref 137–147)

## 2014-06-20 LAB — GLUCOSE, CAPILLARY: Glucose-Capillary: 82 mg/dL (ref 70–99)

## 2014-06-20 LAB — URINE CULTURE
COLONY COUNT: NO GROWTH
CULTURE: NO GROWTH

## 2014-06-20 LAB — CBC
HEMATOCRIT: 28.6 % — AB (ref 36.0–46.0)
Hemoglobin: 9.3 g/dL — ABNORMAL LOW (ref 12.0–15.0)
MCH: 30.8 pg (ref 26.0–34.0)
MCHC: 32.5 g/dL (ref 30.0–36.0)
MCV: 94.7 fL (ref 78.0–100.0)
PLATELETS: 306 10*3/uL (ref 150–400)
RBC: 3.02 MIL/uL — ABNORMAL LOW (ref 3.87–5.11)
RDW: 16.1 % — AB (ref 11.5–15.5)
WBC: 10.2 10*3/uL (ref 4.0–10.5)

## 2014-06-20 LAB — CLOSTRIDIUM DIFFICILE BY PCR: Toxigenic C. Difficile by PCR: POSITIVE — AB

## 2014-06-20 MED ORDER — FERROUS SULFATE 325 (65 FE) MG PO TABS: 325.0000 mg | ORAL_TABLET | Freq: Every day | ORAL | Status: AC

## 2014-06-20 MED ORDER — SACCHAROMYCES BOULARDII 250 MG PO CAPS: 250.0000 mg | ORAL_CAPSULE | Freq: Two times a day (BID) | ORAL | Status: AC

## 2014-06-20 MED ORDER — VANCOMYCIN 50 MG/ML ORAL SOLUTION: ORAL | Status: DC

## 2014-06-20 MED ORDER — VANCOMYCIN 50 MG/ML ORAL SOLUTION: ORAL | Status: AC

## 2014-06-20 MED ORDER — FOLIC ACID 1 MG PO TABS: 1.0000 mg | ORAL_TABLET | Freq: Every day | ORAL | Status: AC

## 2014-06-20 NOTE — Progress Notes (Signed)
OT Cancellation Note  Patient Details Name: Traci Mclaughlin MRN: [id number] DOB: 12-21-1950   Cancelled Treatment:    Reason Eval/Treat Not Completed: Other (comment). Per notes, pt planning to d/c home today and is setup with [place]. All further needs can be met in next venue of care.  Benito Mccreedy OTR/L [phone] 06/20/2014, 4:55 PM

## 2014-06-20 NOTE — Evaluation (Signed)
Physical Therapy Evaluation Patient Details Name: Traci Mclaughlin MRN: [id number] DOB: Jul 23, 1951 Today's Date: 06/20/2014   History of Present Illness  63 yo female admitted with rectal bleeding on 06/18/14 with x2 days occurrence. pt presents with decubitus ulcer and colitis. recent admission due to rectal ulcer 06/04/14 PMH:  HOH, HTN, CVA, PAD, CA cervical, depression, CVA, MI, RBBB, CKD, anemia, rectal ulcer 06/04/2014, diverticulosis, loop recorder, LT AKA  Clinical Impression  Pt shows to be at baseline functioning for basic mobility in a home-like environment.  C-diff may pose a little problem if pt can not get to a toilet fast enough.  Pt's daughter present for the session and understands that she may have to assist a little more.    Follow Up Recommendations Home health PT (or outpatient to build strength if she wants a prosthesis)    Equipment Recommendations  None recommended by PT    Recommendations for Other Services       Precautions / Restrictions Precautions Precautions: Fall Precaution Comments: L AKA      Mobility  Bed Mobility Overal bed mobility: Modified Independent                Transfers Overall transfer level: Needs assistance Equipment used: Rolling walker (2 wheeled);None Transfers: Sit to/from [employer] to Stand: Supervision Stand pivot transfers: Supervision       General transfer comment: cues for safer hand placement  Ambulation/Gait Ambulation/Gait assistance: Supervision Ambulation Distance (Feet): 25 Feet Assistive device: Rolling walker (2 wheeled) Gait Pattern/deviations: Step-to pattern Gait velocity: slower   General Gait Details: stable "swing to " gait pattern  Stairs            Wheelchair Mobility    Modified Rankin (Stroke Patients Only)       Balance Overall balance assessment: No apparent balance deficits (not formally assessed) Sitting-balance support: No upper extremity  supported Sitting balance-Leahy Scale: Good     Standing balance support: Bilateral upper extremity supported;No upper extremity supported Standing balance-Leahy Scale: Fair Standing balance comment: one leg                             Pertinent Vitals/Pain      Home Living Family/patient expects to be discharged to:: Private residence Living Arrangements: Children Available Help at Discharge: Family;Friend(s) Type of Home: House Home Access: Stairs to enter Entrance Stairs-Rails: None Entrance Stairs-Number of Steps: 3-4 Home Layout: Two level;Able to live on main level with bedroom/bathroom Home Equipment: Gilford Rile - 2 wheels Additional Comments: information gather from recent admission    Prior Function                 Hand Dominance   Dominant Hand: Right    Extremity/Trunk Assessment               Lower Extremity Assessment: Overall WFL for tasks assessed   LLE Deficits / Details: L AKA     Communication   Communication: HOH  Cognition Arousal/Alertness: Awake/alert Behavior During Therapy:  (emotional) Overall Cognitive Status: Within Functional Limits for tasks assessed                      General Comments General comments (skin integrity, edema, etc.): Goes to bathroom with RW and short distances otherwise uses her w/c, generally independently    Exercises        Assessment/Plan    PT Assessment All further PT  needs can be met in the next venue of care  PT Diagnosis     PT Problem List Decreased strength;Pain;Decreased activity tolerance  PT Treatment Interventions     PT Goals (Current goals can be found in the Care Plan section) Acute Rehab PT Goals PT Goal Formulation: All assessment and education complete, DC therapy    Frequency     Barriers to discharge        Co-evaluation               End of Session   Activity Tolerance: Patient tolerated treatment well Patient left: in bed;with  family/visitor present;with call bell/phone within reach;Other (comment) (sitting EOB) Nurse Communication: Mobility status         Time: 1310-1355 PT Time Calculation (min) (ACUTE ONLY): 45 min   Charges:   PT Evaluation $Initial PT Evaluation Tier I: 1 Procedure PT Treatments $Gait Training: 8-22 mins $Therapeutic Activity: 8-22 mins   PT G Codes:          [name], Tessie Fass 06/20/2014, 2:08 PM 06/20/2014  Donnella Sham, PT [phone]  (pager)

## 2014-06-20 NOTE — Telephone Encounter (Signed)
Heather from [employer] called, state that pt has been in non-compliance and discharge orders were going to be filed. She says staffers checked to see if patient had been admitted to the Hospital and could not find any record. I checked Epic and sure enough pt was admitted 06/18/2014 for rectal bleeding and possible C.diff. Called Heather back and left message that patient is indeed admitted to [facility].

## 2014-06-20 NOTE — Plan of Care (Signed)
Problem: Phase II Progression Outcomes Goal: Progress activity as tolerated unless otherwise ordered Outcome: Progressing  Problem: Phase III Progression Outcomes Goal: Voiding independently Outcome: Completed/Met Date Met:  06/20/14

## 2014-06-20 NOTE — Plan of Care (Signed)
Problem: Phase II Progression Outcomes Goal: Vital signs remain stable Outcome: Completed/Met Date Met:  06/20/14 Goal: Obtain order to discontinue catheter if appropriate Outcome: Not Applicable Date Met:  06/20/14  Problem: Phase III Progression Outcomes Goal: Pain controlled on oral analgesia Outcome: Completed/Met Date Met:  06/20/14 Goal: Foley discontinued Outcome: Not Applicable Date Met:  06/20/14  Problem: Discharge Progression Outcomes Goal: Pain controlled with appropriate interventions Outcome: Completed/Met Date Met:  06/20/14 Goal: Hemodynamically stable Outcome: Completed/Met Date Met:  06/20/14     

## 2014-06-20 NOTE — Discharge Instructions (Signed)
Stop taking Protonix, since it may worsens C Diff Diarrhea. Take Pepcid as needed for heartburn. Take oral Vancomycin 4 times per day, for 7 days

## 2014-06-20 NOTE — Discharge Summary (Signed)
Physician Discharge Summary  Traci Mclaughlin XKG:[id number] DOB: [birth year] DOA: 06/18/2014  PCP: [name], PA-C  Admit date: 06/18/2014 Discharge date: 06/20/2014  Time spent: 45 minutes  Recommendations for Outpatient Follow-up:  1. Follow up CBC with PCP for reevaluation of hemoglobin. 2. 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 Patient is hard of hearing and is hard to communicate with. Attempted to call daughter, but unable to get to to her. Information obtained from the chart. Patient has had ongoing issues with rectal bleeding and diarrhea. Recent colonscopy with ulcers from constipation, and she is placed on Miralax. Recently evaluated for diarrhea and placed on oral Flagyl. She has been on antibiotics after AKA of left leg. She denies abdominal pain, chest pain, or sob. In ED, Ct abd/pelvis with evidence of colitis and proctitis due to C diff. She is admitted for further management.  Hospital Course:   Colitis/Proctitis secondary to C diff -Patient presented with diarrhea and mild leukocytosis of 10.8 without fever. Ct abd/pelvis with evidence of distal colitis and proctitis secondary to C diff and findings of hepatic cysts. She was C diff positive. Patient has C diff risk factor as she was recently on antibiotics after amputation. She was given a one time dose of IV Ciprofloxacin. She has recently failed outpatient therapy  with flagyl for diarrhea, and thus was placed on oral Vancocin for 3 days.  Also treated with IV zosyn for 3 days for colitis.  -On discharge, will discontinue IV Zosyn.   -Continue Oral Vancomycin 250mg  every 6 hours for 5 days.   Rectal Bleed -Patient was previous evaluated for rectal bleed and EGD/Colonscopy (10/15) showed normal EGD and colonscopy with large rectal ulcers secondary to constipation. Also evidence of hemorrhoids, diverticuli, with no evidence of malignancy. On discharge, hgb is stable at 9.3. -Follow up CBC in one week with PCP to reevaluate Hgb.  Leukocytosis -Presented with mild leukocytosis of 10.8, which has resolved on discharge. This is likely due to colitis. CXR with no acute abnormalities,  UA negative, and Urine cultures with no growth to date.     Hypokalemia -Resolved on discharge.  Replaced with one dose of Kdur 46meq.  PAD -s/p AKA of left leg on 10/15. Leg with normal healing and no signs of infection.   Decubitus Ulcer -Healed ulcer on buttox, continue topical collagenase.  Hyperglycemia -CBGs stable on discharge.  Hgb A1C 5.8  CAD -Stable. Denies chest pain -Continue ASA 81mg  and Plavix 75 mg daily   CKD, Stage III -Creatinine normal on discharge  Normocytic Anemia -Hgb 9.3, likley secondary to CKD. Anemia panel with low iron, normal folic acid.  -Continue folic acid daily.  -Will add iron supplements daily  Hyperlipidemia -TSH normal.  -Continue Lipitor 40mg  daily  Hx of CVA -With deficits of right hemiparesis -On discharge, continue Plavix and ASA  Adjustment disorder with mixed anxiety and depression -Stable with no SI or hallucinations.  -  Cymbalta 60mg  daily, Xanax 0.25mg  daily  GERD -Will stop Protonix due to C diff colitis -Take Pepcid PRN   Procedures:  None  Consultations:  None  Discharge Exam: Filed Vitals:   06/20/14 0430  BP: 144/70  Pulse: 92  Temp: 97.5 F (36.4 C)  Resp: 18     Exam General:  Alert and oriented in NAD. Laying comfortably in bed Eyes: Anicteric account.  Cardiovascular: Regular rate and rhythm.  No murmurs, rubs, or gallops. Respiratory: Clear to auscultate bilaterally.  No rhonchi or crepitations. Abdomen: Soft nontender bowel sounds present. No guarding or rigidity.  Musculoskeletal: No edema. Left Leg- AKA.  Healed ulcer on buttox Psychiatric: Appears normal.  Neurologic: Alert awake oriented to time place and person.    Discharge Instructions    Medication List    STOP taking these medications        metroNIDAZOLE 500 MG tablet  Commonly known as:  FLAGYL     pantoprazole 40 MG tablet  Commonly known as:  PROTONIX      TAKE these medications        acetaminophen 325 MG tablet  Commonly known as:  TYLENOL  Take 1-2 tablets (325-650 mg total) by mouth every 4 (four) hours as needed for mild pain.     ALPRAZolam 0.25 MG tablet  Commonly known as:  XANAX  Take 0.25 mg by mouth at bedtime as needed for anxiety.     aspirin 81 MG EC tablet  Take 1 tablet (81 mg total) by mouth daily.     atorvastatin 40 MG tablet  Commonly known as:  LIPITOR  Take 40 mg by mouth daily at 6 PM.     carvedilol 6.25 MG tablet  Commonly known as:  COREG  Take 3 tablets (18.75 mg total) by mouth 2 (two) times daily with a meal.     clopidogrel 75 MG tablet  Commonly known as:  PLAVIX  Take 1 tablet (75 mg total) by mouth daily.     collagenase ointment  Commonly known as:  SANTYL  Apply topically daily. Apply to sacrum and cover with damp to dry dressing daily.     DSS 100 MG Caps  Take 100 mg by mouth 2 (two) times daily.     DULoxetine 60 MG capsule  Commonly known as:  CYMBALTA  Take 1 capsule (60 mg total) by mouth daily.     ferrous sulfate 325 (65 FE) MG tablet  Take 1 tablet (325 mg total) by mouth daily with breakfast.     fluconazole 150 MG tablet  Commonly known as:  DIFLUCAN  Take 1 tablet (150 mg total) by mouth once.     folic acid 1  MG tablet  Commonly known as:  FOLVITE  Take 1 tablet (1 mg total) by mouth daily.     megestrol 40 MG/ML suspension  Commonly known as:  MEGACE  Take 10 mLs (400 mg total) by mouth 2 (two) times daily.     oxyCODONE-acetaminophen 10-325 MG per tablet  Commonly known as:  PERCOCET  Take 0.5-1 tablets by mouth every 8 (eight) hours as needed for pain.     ramipril 5 MG capsule  Commonly known as:  ALTACE  Take 1 capsule (5 mg total) by mouth daily.     senna-docusate 8.6-50 MG per tablet  Commonly known as:  Senokot-S  Take 2 tablets by mouth 2 (two) times daily.     vancomycin 50 mg/mL oral solution  Commonly  known as:  VANCOCIN  - 250 mg QID, please dispense 5 day supply.    - Dispense as written       No Known Allergies    The results of significant diagnostics from this hospitalization (including imaging, microbiology, ancillary and laboratory) are listed below for reference.    Significant Diagnostic Studies: Ct Abdomen Pelvis W Contrast  06/18/2014   CLINICAL DATA:  Rectal bleeding that began yesterday. Initial encounter.  EXAM: CT ABDOMEN AND PELVIS WITH CONTRAST  TECHNIQUE: Multidetector CT imaging of the abdomen and pelvis was performed using the standard protocol following bolus administration of intravenous contrast.  CONTRAST:  170mL OMNIPAQUE IOHEXOL 300 MG/ML  SOLN  COMPARISON:  None.  FINDINGS: Musculoskeletal: No aggressive osseous lesions. L5-S1 predominant lumbar spondylosis. Lower lumbar facet arthrosis accounts for anterolisthesis of L4 on L5. Anterolisthesis is grade I.  Lung Bases: Atelectasis.  Liver: Small low-density lesions in the LEFT and RIGHT hepatic lobes anteriorly compatible with cysts.  Spleen:  Normal.  Gallbladder:  Distended.  Normal.  Common bile duct:  Normal.  Pancreas:  Normal.  Adrenal glands:  Normal bilaterally.  Kidneys: RIGHT upper pole renal scarring. Normal enhancement. Normal delayed excretion of contrast. Both ureters appear within  normal limits.  Stomach:  Partially collapsed.  Small bowel: Duodenum is normal. Small bowel is within normal limits. No small bowel obstruction. No mesenteric adenopathy or mass lesions.  Colon: Normal appendix. Mild mural thickening of the ascending colon is apparently due to underdistention. Descending colonic diverticulosis. The rectosigmoid is inflamed, with colitis. Mural thickening measures up to 9 mm. The ischiorectal fossa shows stranding secondary to colitis and proctitis. Proximal sigmoid appears within normal limits.  Pelvic Genitourinary: Uterine atrophy, expected for age. Small calcified fibroid. Urinary bladder appears within normal limits.  Vasculature: Atherosclerosis. No acute vascular abnormality. Mural plaque in the infrarenal abdominal aorta. IVC filter present, with low-attenuation clot centrally in the IVC filter. Portal vein is patent. No mesenteric thrombosis.  Body Wall: Normal.  IMPRESSION: 1. Distal sigmoid colitis and proctitis. Primary consideration is infection, including C difficile colitis. Inflammatory bowel disease or ischemia are unlikely in this location. 2. Small hepatic cysts. 3. IVC filter with contained clot centrally. 4. Atherosclerosis.   Electronically Signed   By: Dereck Ligas M.D.   On: 06/18/2014 17:03    Microbiology: Recent Results (from the past 240 hour(s))  Urine culture     Status: None   Collection Time: 06/18/14  2:30 PM  Result Value Ref Range Status   Specimen Description URINE, CATHETERIZED  Final   Special Requests NONE  Final   Culture  Setup Time   Final    06/19/2014 06:11 Performed at [place] Performed at [facility]   Final   Culture NO GROWTH Performed at [facility]   Final   Report Status 06/20/2014 FINAL  Final  Clostridium Difficile by PCR     Status: Abnormal   Collection Time: 06/19/14  5:44 PM  Result Value Ref Range Status   C difficile by pcr POSITIVE (A)  NEGATIVE Final    Comment: CRITICAL RESULT CALLED TO, READ BACK BY AND VERIFIED WITH: P MOSS,RN AT 0840 06/20/14 BY K BARR      Labs: Basic Metabolic Panel:  Recent Labs Lab 06/18/14 1322 06/19/14 0556 06/20/14 0939  NA 139 142 137  K 3.9 3.5* 3.1*  CL 102 104 103  CO2 25 25 20   GLUCOSE  117* 127* 159*  BUN 11 7 8   CREATININE 0.87 0.85 0.84  CALCIUM 9.4 9.0 8.3*   Liver Function Tests:  Recent Labs Lab 06/18/14 1322 06/19/14 0556  AST 19 14  ALT 13 11  ALKPHOS 62 58  BILITOT 0.3 0.3  PROT 6.8 6.3  ALBUMIN 3.0* 2.6*   No results for input(s): LIPASE, AMYLASE in the last 168 hours. No results for input(s): AMMONIA in the last 168 hours. CBC:  Recent Labs Lab 06/18/14 1322 06/19/14 0556 06/20/14 0939  WBC 7.3 10.8* 10.2  HGB 10.3* 10.0* 9.3*  HCT 32.1* 30.6* 28.6*  MCV 96.1 94.7 94.7  PLT 382 327 306   Cardiac Enzymes: No results for input(s): CKTOTAL, CKMB, CKMBINDEX, TROPONINI in the last 168 hours. BNP: BNP (last 3 results) No results for input(s): PROBNP in the last 8760 hours. CBG:  Recent Labs Lab 06/19/14 0800 06/20/14 0758  GLUCAP 117* 82       Signed:  Lacy Duverney PA-C  [facility] 06/20/2014, 12:15 PM

## 2014-06-25 LAB — HEMOGLOBIN A1C
Hgb A1c MFr Bld: 5.4 % (ref <5.7)
Mean Plasma Glucose: 108 mg/dL (ref <117)

## 2014-06-25 LAB — IRON AND TIBC
IRON: 40 ug/dL — AB (ref 42–135)
Saturation Ratios: 29 % (ref 20–55)
TIBC: 136 ug/dL — ABNORMAL LOW (ref 250–470)
UIBC: 96 ug/dL — ABNORMAL LOW (ref 125–400)

## 2014-06-25 LAB — FERRITIN: Ferritin: 415 ng/mL — ABNORMAL HIGH (ref 10–291)

## 2014-06-27 ENCOUNTER — Encounter: Payer: Medicare Other | Attending: Physical Medicine & Rehabilitation | Admitting: Physical Medicine & Rehabilitation

## 2014-07-03 ENCOUNTER — Encounter: Payer: Self-pay | Admitting: Cardiology

## 2014-07-03 ENCOUNTER — Telehealth: Payer: Self-pay | Admitting: Cardiology

## 2014-07-03 NOTE — Telephone Encounter (Signed)
LMOVM requesting that pt send manual transmission b/c home monitor has been disconnected.   

## 2014-07-04 ENCOUNTER — Encounter (HOSPITAL_COMMUNITY): Payer: Self-pay | Admitting: Interventional Cardiology

## 2014-07-04 ENCOUNTER — Encounter: Payer: Self-pay | Admitting: Interventional Cardiology

## 2014-07-05 ENCOUNTER — Encounter (HOSPITAL_COMMUNITY): Payer: Self-pay | Admitting: Internal Medicine

## 2014-07-09 ENCOUNTER — Telehealth: Payer: Self-pay | Admitting: Medical

## 2014-07-09 NOTE — Telephone Encounter (Signed)
Caller name: [name] whitfield Relation to pt: daughter Call back number: [phone] Pharmacy: [facility]  Reason for call:   Patient daughter states that patient thinks that she has c diff again and would like an antibiotic called in

## 2014-07-09 NOTE — Telephone Encounter (Signed)
Pt is very complicated and was hospitalized recently. Not just simple call in of antibiotic. She needs appointment and needs 45 minute appointment.

## 2014-07-10 ENCOUNTER — Telehealth: Payer: Self-pay | Admitting: Medical

## 2014-07-10 NOTE — Telephone Encounter (Signed)
Called daughter and advised patient would need to come in for office visit before ABO can be called in. Daughter states patient refuses to leave home for OV. Advised that she either needs to come in to office or go to Urgent care. Daughter states patient has diarrhea again and needs ABO for this reason. Said "Ok Thank You" and hung up the phone.

## 2014-07-10 NOTE — Telephone Encounter (Signed)
I asked LPN to notify pt she needs appointment. Can't just call in antibiotic. She was hospitalized since I last saw her and I need to see how she is. Pt has history of various hospitalizations. I am uncomfortable with approach of just calling in medications without seeing her. Traci Mclaughlin was expressing this to patient but daughter did not schedule appointment.

## 2014-07-10 NOTE — Telephone Encounter (Signed)
Traci Mclaughlin,  Pt is not on list for hospital follow up.  Pt was in the hospital back in November (06/18/14- 06/20/14) for rectal bleed and colitis.  Please schedule as [name], PA-C suggested.

## 2014-07-12 ENCOUNTER — Telehealth: Payer: Self-pay | Admitting: Medical

## 2014-07-12 ENCOUNTER — Ambulatory Visit: Payer: Medicare Other | Admitting: Family Medicine

## 2014-07-12 NOTE — Telephone Encounter (Signed)
Do not charge  

## 2014-07-12 NOTE — Telephone Encounter (Signed)
See notes below

## 2014-07-12 NOTE — Telephone Encounter (Signed)
Pt cancelled appointment today because she was really sick and family decided to take her to the ER instead. Pt does not want to be charged no show fee.

## 2014-07-13 ENCOUNTER — Telehealth: Payer: Self-pay | Admitting: Cardiology

## 2014-07-13 ENCOUNTER — Encounter: Payer: Self-pay | Admitting: Cardiology

## 2014-07-13 NOTE — Telephone Encounter (Signed)
LMOVM requesting that pt send manual transmission b/c home monitor has been disconnected.   

## 2014-07-30 DIAGNOSIS — I69920 Aphasia following unspecified cerebrovascular disease: Secondary | ICD-10-CM

## 2014-07-30 DIAGNOSIS — Z8541 Personal history of malignant neoplasm of cervix uteri: Secondary | ICD-10-CM

## 2014-07-30 DIAGNOSIS — I10 Essential (primary) hypertension: Secondary | ICD-10-CM

## 2014-07-30 DIAGNOSIS — I69951 Hemiplegia and hemiparesis following unspecified cerebrovascular disease affecting right dominant side: Secondary | ICD-10-CM

## 2014-07-30 DIAGNOSIS — Z7901 Long term (current) use of anticoagulants: Secondary | ICD-10-CM

## 2014-07-30 DIAGNOSIS — I739 Peripheral vascular disease, unspecified: Secondary | ICD-10-CM

## 2014-07-30 DIAGNOSIS — M9190 Juvenile osteochondrosis of hip and pelvis, unspecified, unspecified leg: Secondary | ICD-10-CM

## 2014-07-30 DIAGNOSIS — I252 Old myocardial infarction: Secondary | ICD-10-CM

## 2014-08-03 ENCOUNTER — Encounter: Payer: Self-pay | Admitting: Cardiology

## 2014-08-24 ENCOUNTER — Encounter: Payer: Self-pay | Admitting: Cardiology

## 2014-09-06 ENCOUNTER — Encounter: Payer: Self-pay | Admitting: Vascular Surgery

## 2014-09-06 ENCOUNTER — Ambulatory Visit (INDEPENDENT_AMBULATORY_CARE_PROVIDER_SITE_OTHER): Payer: Medicare Other | Admitting: Vascular Surgery

## 2014-09-06 VITALS — BP 149/85 | HR 80 | Ht 60.0 in | Wt 153.0 lb

## 2014-09-06 DIAGNOSIS — Z48812 Encounter for surgical aftercare following surgery on the circulatory system: Secondary | ICD-10-CM

## 2014-09-06 NOTE — Progress Notes (Signed)
[facility]  Patient name: Traci Mclaughlin MRN: [id number] DOB: Mar 21, 1951 Sex: female  REASON FOR VISIT: Follow up after left above-the-knee amputation.  HPI: Traci Mclaughlin is a 64 y.o. female who I last saw on 05/30/2014. She underwent a left above-the-knee amputation on 05/04/2014. She has a known left iliac artery occlusion. She was not a candidate for revascularization. At the time of her last visit, her left AKA was healing nicely. She comes in for routine follow up visit.  She has been doing very well and has completed physical therapy. She was walking with a walker. She is now at home with her daughter and is able to take care of herself. She is able to form her activities of daily living. She has been cooking and helping take care of her grandchild. Her strength has improved significantly. She is very anxious to proceed with obtaining a prosthesis for the left leg so that she can be even more mobile.  She has no complaints of pain or paresthesias in the right lower extremity. She has no ulcers in the right lower extremity.   Past Medical History  Diagnosis Date  . Hypertension   . HOH (hard of hearing)   . CVA (cerebral infarction)   . PAD (peripheral artery disease) 04/10/2014  . Cancer     cervical; was on chemo; has been in remission for 53yrs  . Depression 04/19/2014  . Hx of cardiovascular stress test     Nuclear (9/15):  Inf ischemia, EF 35%; high risk  . Stroke Aug. 10, 2015  . STEMI (ST elevation myocardial infarction) 06/2011  . RBBB (right bundle branch block) 05/02/2014  . Chronic kidney disease     daughter says no,they checked them,okay  . Anemia 06/04/2014  . Rectal ulcer     likely due to constipation  . Diverticulosis   . Internal hemorrhoid    Family History  Problem Relation Age of Onset  . Intracerebral hemorrhage Daughter     during childbirth  . Stroke Daughter   . Heart attack Neg Hx   . Diabetes Mother   .  Alcohol abuse Mother   . Lung cancer Father   . COPD Father    SOCIAL HISTORY: History  Substance Use Topics  . Smoking status: Former Smoker -- 1.00 packs/day for 1 years    Types: Cigarettes  . Smokeless tobacco: Former Systems developer    Quit date: 06/16/2011  . Alcohol Use: 3.0 oz/week    5 Glasses of wine per week     Comment: occassional   No Known Allergies  REVIEW OF SYSTEMS: Valu.Nieves ] denotes positive finding; [  ] denotes negative finding  CARDIOVASCULAR:  [ ]  chest pain   [ ]  chest pressure   [ ]  palpitations   [ ]  orthopnea   [ ]  dyspnea on exertion   [ ]  claudication   [ ]  rest pain   [ ]  DVT   [ ]  phlebitis PULMONARY:   [ ]  productive cough   [ ]  asthma   [ ]  wheezing NEUROLOGIC:   [ ]  weakness  [ ]  paresthesias  [ ]  aphasia  [ ]  amaurosis  [ ]  dizziness GASTROINTESTINAL: [ ]   blood in stool  [ ]   hematemesis GENITOURINARY:  [ ]   dysuria  [ ]   hematuria PSYCHIATRIC:  [ ]  history of major depression INTEGUMENTARY:  [ ]  rashes  [ ]  ulcers CONSTITUTIONAL:  [ ]  fever   [ ]   chills  PHYSICAL EXAM: Filed Vitals:   09/06/14 1028  BP: 149/85  Pulse: 80  Height: 5' (1.524 m)  Weight: 153 lb (69.4 kg)  SpO2: 99%   Body mass index is 29.88 kg/(m^2). GENERAL: The patient is a well-nourished female, in no acute distress. The vital signs are documented above. CARDIOVASCULAR: There is a regular rate and rhythm. She has a palpable right femoral pulse. Right foot is warm and well-perfused. PULMONARY: There is good air exchange bilaterally without wheezing or rales. ABDOMEN: Soft and non-tender with normal pitched bowel sounds.  MUSCULOSKELETAL: Her above-the-knee amputation site has healed nicely.NEUROLOGIC: No focal weakness or paresthesias are detected. SKIN: There are no ulcers or rashes noted. PSYCHIATRIC: The patient has a normal affect.  MEDICAL ISSUES: The patient is doing well status post left above-the-knee amputation. Her strength has improved significantly and she is doing very  well at home and is able to perform many activities. She has been cooking and helping take care of her grandchild. Strength has improved significantly. I think she is an excellent candidate to proceed with  obtaining a prosthesis for her left above-the-knee amputation. I plan on seeing her back in 6 months with follow up ABIs in the right side. She knows to call sooner if she has problems.  No Follow-up on file.   [facility]: [phone]

## 2014-09-12 ENCOUNTER — Ambulatory Visit: Payer: Self-pay | Admitting: Vascular Surgery

## 2014-09-18 ENCOUNTER — Encounter: Payer: Self-pay | Admitting: *Deleted

## 2014-10-18 ENCOUNTER — Ambulatory Visit: Payer: Self-pay | Admitting: Medical

## 2014-11-27 ENCOUNTER — Ambulatory Visit: Payer: Medicare Other | Admitting: Physical Therapy

## 2014-11-28 ENCOUNTER — Encounter (HOSPITAL_COMMUNITY): Payer: Medicare Other

## 2014-11-28 ENCOUNTER — Ambulatory Visit: Payer: Medicare Other | Admitting: Vascular Surgery

## 2014-12-10 ENCOUNTER — Ambulatory Visit: Payer: Medicare Other | Admitting: Physical Therapy

## 2014-12-17 ENCOUNTER — Ambulatory Visit: Payer: Medicare Other | Attending: Vascular Surgery | Admitting: Physical Therapy

## 2015-03-05 ENCOUNTER — Encounter: Payer: Self-pay | Admitting: Vascular Surgery

## 2015-03-06 ENCOUNTER — Encounter (HOSPITAL_COMMUNITY): Payer: Medicare Other

## 2015-03-06 ENCOUNTER — Ambulatory Visit: Payer: Self-pay | Admitting: Vascular Surgery

## 2015-05-09 ENCOUNTER — Telehealth: Payer: Self-pay | Admitting: Medical

## 2015-05-09 NOTE — Telephone Encounter (Signed)
I reviewed pt chart today after receiving form to fill out/sign.   She does appear to need care attendant based on her complicated medical problems. I reveiwed her chart and she had not been seen since November 2015. At that time I asked her to follow up and then she did not. I have not seen the pt since then though she/family did call requesting to get antibiotic for diarrhea without being seen. It has been almost a year now since I have seen pt. I think it in pt best interest if she found new primary care provider. Thus I signed dismissal letter and gave it to office manager.

## 2015-05-09 NOTE — Telephone Encounter (Signed)
Call from [employer]. They are faxing over request to determine if pt is in need of a full time live in aide. Provided fax # [phone].

## 2015-05-09 NOTE — Telephone Encounter (Signed)
Form received, filled out as much as possible and forwarded to [place]. JG//CMA

## 2015-05-10 NOTE — Telephone Encounter (Signed)
Form faxed to [employer] at [phone] successfully. Sent for scanning. JG//CMA

## 2015-05-14 ENCOUNTER — Telehealth: Payer: Self-pay | Admitting: Medical

## 2015-05-14 NOTE — Telephone Encounter (Signed)
Patient dismissed from [facility] by [name] MD , effective May 09, 2015. Dismissal letter sent out by certified / registered mail.  [facility]  Certified dismissal letter returned as undeliverable, unclaimed, return to sender after three attempts by [place]. Letter placed in another envelope and resent as 1st class mail which does not require a signature. 06/07/15 DAJ

## 2016-02-12 ENCOUNTER — Emergency Department (HOSPITAL_BASED_OUTPATIENT_CLINIC_OR_DEPARTMENT_OTHER): Payer: Medicare PPO

## 2016-02-12 ENCOUNTER — Emergency Department (HOSPITAL_BASED_OUTPATIENT_CLINIC_OR_DEPARTMENT_OTHER)
Admission: EM | Admit: 2016-02-12 | Discharge: 2016-02-12 | Disposition: A | Discharge status: Home / Self Care | Payer: Medicare PPO | Attending: Emergency Medicine | Admitting: Emergency Medicine

## 2016-02-12 DIAGNOSIS — Z89612 Acquired absence of left leg above knee: Secondary | ICD-10-CM | POA: Insufficient documentation

## 2016-02-12 DIAGNOSIS — I252 Old myocardial infarction: Secondary | ICD-10-CM | POA: Insufficient documentation

## 2016-02-12 DIAGNOSIS — R030 Elevated blood-pressure reading, without diagnosis of hypertension: Secondary | ICD-10-CM

## 2016-02-12 DIAGNOSIS — D494 Neoplasm of unspecified behavior of bladder: Secondary | ICD-10-CM | POA: Insufficient documentation

## 2016-02-12 DIAGNOSIS — K644 Residual hemorrhoidal skin tags: Secondary | ICD-10-CM | POA: Insufficient documentation

## 2016-02-12 DIAGNOSIS — N3001 Acute cystitis with hematuria: Principal | ICD-10-CM | POA: Insufficient documentation

## 2016-02-12 DIAGNOSIS — I2510 Atherosclerotic heart disease of native coronary artery without angina pectoris: Secondary | ICD-10-CM | POA: Insufficient documentation

## 2016-02-12 DIAGNOSIS — I10 Essential (primary) hypertension: Secondary | ICD-10-CM | POA: Insufficient documentation

## 2016-02-12 HISTORY — DX: Atherosclerotic heart disease of native coronary artery without angina pectoris: I25.10

## 2016-02-12 LAB — COMPREHENSIVE METABOLIC PROFILE - BMC/JMC ONLY
ALBUMIN/GLOBULIN RATIO: 1.5 (ref 0.8–2.0)
ALBUMIN: 3.9 g/dL (ref 3.5–5.0)
ALKALINE PHOSPHATASE: 77 U/L (ref 38–126)
ALT (SGPT): 12 U/L — ABNORMAL LOW (ref 14–54)
ANION GAP: 8 mmol/L (ref 3–11)
AST (SGOT): 22 U/L (ref 15–41)
BILIRUBIN TOTAL: 0.7 mg/dL (ref 0.3–1.2)
BUN/CREA RATIO: 15 (ref 6–22)
BUN: 16 mg/dL (ref 6–20)
BUN: 16 mg/dL (ref 6–20)
CALCIUM: 9.3 mg/dL (ref 8.8–10.2)
CHLORIDE: 107 mmol/L (ref 101–111)
CO2 TOTAL: 24 mmol/L (ref 22–32)
CREATININE: 1.09 mg/dL — ABNORMAL HIGH (ref 0.44–1.00)
ESTIMATED GFR: >60 mL/min/1.73mˆ2 (ref >60)
GLUCOSE: 84 mg/dL (ref 70–110)
POTASSIUM: 4.1 mmol/L (ref 3.4–5.1)
PROTEIN TOTAL: 6.5 g/dL (ref 6.4–8.3)
SODIUM: 139 mmol/L (ref 136–145)

## 2016-02-12 LAB — URINALYSIS WITH MICROSCOPIC REFLEX IF INDICATED BMC/JMC ONLY
BILIRUBIN: NEGATIVE mg/dL
GLUCOSE: NEGATIVE mg/dL
KETONES: NEGATIVE mg/dL
NITRITE: NEGATIVE
PH: 6 (ref <8.0)
PH: 6 (ref <8.0)
PROTEIN: >=300 mg/dL — AB
SPECIFIC GRAVITY: 1.025 — ABNORMAL HIGH (ref <1.022)
UROBILINOGEN: 0.2 mg/dL (ref <=2.0)

## 2016-02-12 LAB — CBC WITH DIFF
BASOPHIL #: 0.1 x10ˆ3/uL (ref 0.00–0.10)
BASOPHIL %: 1 % (ref 0–3)
EOSINOPHIL #: 0.2 10*3/uL (ref 0.00–0.50)
EOSINOPHIL #: 0.2 x10ˆ3/uL (ref 0.00–0.50)
EOSINOPHIL %: 2 % (ref 0–5)
HCT: 40 % (ref 36.0–45.0)
HCT: 40 % (ref 36.0–45.0)
HGB: 13.3 g/dL (ref 12.0–15.5)
LYMPHOCYTE #: 1.6 10*3/uL (ref 1.00–4.80)
LYMPHOCYTE #: 1.6 x10ˆ3/uL (ref 1.00–4.80)
LYMPHOCYTE %: 22 % (ref 15–43)
MCH: 32.4 pg (ref 27.5–33.2)
MCHC: 33.2 g/dL (ref 32.0–36.0)
MCV: 97.6 fL — ABNORMAL HIGH (ref 82.0–97.0)
MONOCYTE #: 0.4 x10ˆ3/uL (ref 0.20–0.90)
MONOCYTE %: 6 % (ref 5–12)
MPV: 8.8 fL (ref 7.4–10.5)
MPV: 8.8 fL (ref 7.4–10.5)
NEUTROPHIL #: 5.1 x10ˆ3/uL (ref 1.50–6.50)
NEUTROPHIL %: 69 % (ref 43–76)
PLATELETS: 203 x10ˆ3/uL (ref 150–450)
RBC: 4.1 x10ˆ6/uL (ref 4.00–5.10)
RDW: 12.9 % (ref 11.0–16.0)
WBC: 7.4 x10ˆ3/uL (ref 4.0–11.0)

## 2016-02-12 LAB — OCCULT BLOOD, STOOL: OCCULT BLOOD: NEGATIVE

## 2016-02-12 LAB — URINALYSIS, MICROSCOPIC: RBCS: >100 /HPF — AB (ref 0–2)

## 2016-02-12 MED ORDER — CEFUROXIME AXETIL 500 MG TABLET
500.00 mg | ORAL_TABLET | Freq: Two times a day (BID) | ORAL | 0 refills | Status: AC
Start: 2016-02-12 — End: 2016-02-19

## 2016-02-12 MED ORDER — IOPAMIDOL 370 MG IODINE/ML (76 %) INTRAVENOUS SOLUTION
100.00 mL | INTRAVENOUS | Status: AC
Start: 2016-02-12 — End: 2016-02-12
  Administered 2016-02-12: 80 mL via INTRAVENOUS
  Filled 2016-02-12: qty 100

## 2016-02-12 NOTE — ED Nurses Note (Signed)
Updated Dr. Collene Mares in regards of elevated BPs.

## 2016-02-12 NOTE — ED Nurses Note (Signed)
Patient discharged home with family.  AVS reviewed with patient/care giver.  A written copy of the AVS and discharge instructions was given to the patient/care giver.  Questions sufficiently answered as needed.  Patient/care giver encouraged to follow up with PCP as indicated.  In the event of an emergency, patient/care giver instructed to call 911 or go to the nearest emergency room.     New Prescriptions    CEFUROXIME (CEFTIN) 500 MG ORAL TABLET    Take 1 Tab (500 mg total) by mouth Twice daily for 7 days

## 2016-02-12 NOTE — ED Provider Notes (Signed)
Tami Guardian, MD  [facility]  Emergency Department Visit Note    Date:  02/12/2016  Primary care provider:  No Established Pcp  Means of arrival:  private car  History obtained from: patient and daughter  History limited by: none    Chief Complaint: Vaginal Bleeding    HISTORY OF PRESENT ILLNESS     Tami Marshall, date of birth 21-Oct-1950, is a 65 y.o. female who presents to the Emergency Department with her daughter complaining of moderate intermittent vaginal bleeding for the past year with yesterday the symptoms (02/09/16) worse than previously- has had several episodes.  Daughter complains patient sent her a picture of her urine in the toilet and there was blood in the patient's toilet. Patient complains she intermittent;y gets tired. Daughter states patient has a medical history of leg amputation, heart attack, hemorrhoids, cervical cancer, and a stroke. She is on aspirin PO but no other medications.  The patient has recently moved to this area and does not have a PCP. Patient denies fever, weight loss, headache, bruising, bleeding in mouth and nose, chest pain, shortness of breath, syncope, melena, nausea, vomiting, diarrhea, hematochezia, dizziness, blood clots in the toilet, or dysuria.     REVIEW OF SYSTEMS   The pertinent positive and negative symptoms are as per HPI. All other systems reviewed and are negative.     PATIENT HISTORY     Past Medical History:  Past Medical History:   Diagnosis Date    Cancer ([place])     Coronary artery disease     CVA (cerebrovascular accident) ([place])     HTN (hypertension)     MI, old        Past Surgical History:  Past Surgical History:   Procedure Laterality Date    Hx above knee amputation         Family History:    No history of acute family illness given at this time.       Social History:  Social History   Substance Use Topics    Smoking status: Never Smoker    Smokeless tobacco: None    Alcohol use Yes     History   Drug Use No        Medications:    None    Allergies:  No Known Allergies    PHYSICAL EXAM     Vitals:   02/12/16 1415   BP:    Pulse: 68   Resp: 18   Temp: 37.3 C (99.1 F)   SpO2: 98%       Pulse ox  98% on None (Room Air) interpreted by me as: Normal    Constitutional: Obese. Elderly female. No acute distress.   Head: Normocephalic and atraumatic.   ENT: Moist mucous membranes. No erythema or exudates in the oropharynx.  Eyes: EOM are normal. Pupils are equal, round, and reactive to light. No scleral icterus.   Neck: Neck supple. No meningismus.  Cardiovascular: Normal rate and regular rhythm. Normal S1 and S2. Exam reveals no gallop and no friction rub.  No murmur heard.  Pulmonary/Chest: Effort normal and breath sounds normal.   Abdominal: Mild tenderness RLQ without rebounding or guarding. Soft. No distension. No organomegaly. Normal bowel sounds present.  Back: There is no CVA tenderness.  Rectum: External hemorrhoids present but none that are inflamed and bleeding.  GU: External genitalia normal. Speculum and bimanual exam show a very narrow and shallow vagina with no cervix visible/palpable.  Musculoskeletal: Left  Above knee amputation stump is normal. Normal range of motion. No edema and no extremity tenderness. No clubbing or cyanosis.  Lymphadenopathy: No cervical adenopathy.   Neurological: Patient is alert and oriented to person, place, and time. Strength and sensation normal in all extremities. Cranial nerves II - XII intact. Normal speech.  Skin: Skin is warm and dry. No rash noted.     DIAGNOSTIC STUDIES     Labs:    Results for orders placed or performed during the hospital encounter of 02/12/16   URINALYSIS WITH MICROSCOPIC REFLEX IF INDICATED [facility] ONLY   Result Value Ref Range    COLOR Red (A) Light Yellow, Straw, Yellow    APPEARANCE Turbid (A) Clear    PH 6.0 <8.0    LEUKOCYTES Moderate (A) Negative WBCs/uL    NITRITE Negative Negative    PROTEIN >= 300 (A) Negative mg/dL    GLUCOSE Negative Negative  mg/dL    KETONES Negative Negative mg/dL    UROBILINOGEN 0.2  <=2.0 mg/dL    BILIRUBIN Negative Negative mg/dL    BLOOD Large (A) Negative mg/dL    SPECIFIC GRAVITY 1.025 (H) <1.022   COMPREHENSIVE METABOLIC PROFILE - [facility] ONLY   Result Value Ref Range    SODIUM 139 136 - 145 mmol/L    POTASSIUM 4.1 3.4 - 5.1 mmol/L    CHLORIDE 107 101 - 111 mmol/L    CO2 TOTAL 24 22 - 32 mmol/L    ANION GAP 8 3 - 11 mmol/L    BUN 16 6 - 20 mg/dL    CREATININE 1.09 (H) 0.44 - 1.00 mg/dL    BUN/CREA RATIO 15 6 - 22    ESTIMATED GFR >60 >60 mL/min/1.45m2    ALBUMIN 3.9 3.5 - 5.0 g/dL    CALCIUM 9.3 8.8 - 10.2 mg/dL    GLUCOSE 84 70 - 110 mg/dL    ALKALINE PHOSPHATASE 77 38 - 126 U/L    ALT (SGPT) 12 (L) 14 - 54 U/L    AST (SGOT) 22 15 - 41 U/L    BILIRUBIN TOTAL 0.7 0.3 - 1.2 mg/dL    PROTEIN TOTAL 6.5 6.4 - 8.3 g/dL    ALBUMIN/GLOBULIN RATIO 1.5 0.8 - 2.0   CBC WITH DIFF   Result Value Ref Range    WBC 7.4 4.0 - 11.0 x103/uL    RBC 4.10 4.00 - 5.10 x106/uL    HGB 13.3 12.0 - 15.5 g/dL    HCT 40.0 36.0 - 45.0 %    MCV 97.6 (H) 82.0 - 97.0 fL    MCH 32.4 27.5 - 33.2 pg    MCHC 33.2 32.0 - 36.0 g/dL    RDW 12.9 11.0 - 16.0 %    PLATELETS 203 150 - 450 x103/uL    MPV 8.8 7.4 - 10.5 fL    NEUTROPHIL % 69 43 - 76 %    LYMPHOCYTE % 22 15 - 43 %    MONOCYTE % 6 5 - 12 %    EOSINOPHIL % 2 0 - 5 %    BASOPHIL % 1 0 - 3 %    NEUTROPHIL # 5.10 1.50 - 6.50 x103/uL    LYMPHOCYTE # 1.60 1.00 - 4.80 x103/uL    MONOCYTE # 0.40 0.20 - 0.90 x103/uL    EOSINOPHIL # 0.20 0.00 - 0.50 x103/uL    BASOPHIL # 0.10 0.00 - 0.10 x103/uL   Occult Blood, Stool   Result Value Ref Range    OCCULT BLOOD Negative  Negative   URINALYSIS, MICROSCOPIC   Result Value Ref Range    RBCS >100 (A) 0 - 2 /hpf    WBCS 5-10 (A) 0 - 2 /hpf    BACTERIA Marked (A) None /hpf    SQUAMOUS EPITHELIAL  0 - 2 /hpf    WHITE BLOOD CELL CLUMP Present (A) None /hpf     Labs reviewed and interpreted by me. Urine culture pending.    Radiology:    CT ABDOMEN PELVIS W IV  CONTRAST:There is a 3 x 2.5 cm soft tissue density lesion in the left side of the urinary bladder towards the base. It may represent a mass versus a blood clot. The kidneys are normal in appearance.  Radiological imaging interpreted by radiologist and independently reviewed by me.    ED PROGRESS NOTE / MEDICAL DECISION MAKING     I have reviewed the nurse's notes. I have reviewed the patient's problem list.     Orders Placed This Encounter    URINE CULTURE    CT ABDOMEN PELVIS W IV CONTRAST    URINALYSIS WITH MICROSCOPIC REFLEX IF INDICATED [facility] ONLY    COMPREHENSIVE METABOLIC PROFILE - [facility] ONLY    CBC WITH DIFF    Occult Blood, Stool    URINALYSIS, MICROSCOPIC    SCHEDULE FOLLOW-UP [facility] [place]      UA, CBC, CMP, and occult blood stool ordered.    3:46 PM - I performed my initial assessment and discussed the findings with the patient.  UA indicates infection. Added CT Abdomen to order. The patient's daughter in accordance with the treatment plan.   Pelvic exam performed with female RN Kennyth Lose present in room. I will reevaluate.    5:49 PM: Lab and radiological imaging results returned, reviewing.     5:50 PM: On recheck, I counseled the patient and patient's daughter regarding her lab and radiology results and the she will need follow up with Urology for a cystoscopy to determine if she has bladder cancer causing her recurrent painless hematuria (patient's bleeding is urological in origin and not gynecologic/GI). Will prescribe Cerftin PO and recommended patient eat 2 yogurts a day when on these antibiotics. The patient is currently stable for discharge. All questions and concerns were answered and she agrees with the plan. The importance of timely follow up with a PCP and Urology was discussed. She was advised to return to the ED for new or worsening symptoms.    Pre-Disposition Vitals:  Filed Vitals:    02/12/16 1417 02/12/16 1630 02/12/16 1730 02/12/16 1800   BP: (!) 197/94 (!) 178/92  (!) 156/143 (!) 192/86   Pulse:       Resp:    16   Temp:       SpO2:  99% 99% 100%     Pre-hypertension/Hypertension: The patient has been informed that they may have pre-hypertension or hypertension based on an elevated blood pressure reading in the ED.  I recommended that the patient call the provider listed on their discharge instructions or a physician of their choice to arrange follow up for further evaluation of possible pre-hypertension or hypertension.     I have screened the patient for tobacco use and she is a tobacco non user.    CLINICAL IMPRESSION     1. Acute Cystitis with hematuria  2. Bladder tumor  3. Elevated Blood pressure reading      DISPOSITION/PLAN     Discharged    -- Urine culture pending  Prescriptions:     New Prescriptions    CEFUROXIME (CEFTIN) 500 MG ORAL TABLET    Take 1 Tab (500 mg total) by mouth Twice daily for 7 days       Follow-Up:     Marthe Patch, MD  [address]  [place] [address]  [phone]  Call in 1 day  to make an appointment as soon as possible for further evaluation of the tumor in the bladder and blood in your urine.  eat yogurt twice daily until you finish the antibiotics.     Carvel Getting, MD  [place]  [address]  [place] [address]  [phone]  Call in 1 day  to make an appointment to establish a primary care doctor.     [place]  [phone]  immediately if symptoms worsen, or new symptoms appear    [facility]  [address]  [place] [id number]  [phone]        Condition at Disposition: Stable        SCRIBE [place] Tysor, SCRIBE scribed for Tami Guardian, MD on 02/12/2016 at 3:46 PM.     Documentation assistance provided for Sinead Hockman, Harrell Gave, MD  by Adonis Housekeeper, [place]. Information recorded by the scribe was done at my direction and has been reviewed  and validated by me Nyshaun Standage, Harrell Gave, MD.

## 2016-02-12 NOTE — ED Nurses Note (Signed)
Introduced myself to the patient and family as her RN taking over her care. Patient has no issues or concerns at this time.     Lab called and requested additional yellow top due to hemolysis of first labs sent. Patient is aware and Ok with getting more blood.

## 2016-02-12 NOTE — ED Nurses Note (Signed)
Pt recently moved to area and doesn't have a DR. Pt is not on any medications. Pt is hard of hearing.

## 2016-02-12 NOTE — Discharge Instructions (Signed)
Established High Blood Pressure    High blood pressure (hypertension) is a chronic disease. Often, healthcare providers don't know what causes it. But it can be caused by certain health conditions and medicines.  If you have high blood pressure, you may not have any symptoms. If you do have symptoms, they may include headache, dizziness, changes in your vision, chest pain, and shortness of breath. But even without symptoms, high blood pressure that's not treated raises your risk for heart attack and stroke. High blood pressure is a serious health risk and shouldn't be ignored.  A blood pressure reading is made up of two numbers: a higher number over a lower number.The top number is the systolic pressure. The bottom number is the diastolic pressure.A normal blood pressure is a systolic pressure of less than 166 over a diastolic pressure of less than 80. You will see your blood pressure readings written together. For example, a person with a systolic pressure of 063 and a diastolic pressure of 78 will have 118/78 written in the medical record.  High blood pressure is when either the top number is 140 or higher, or the bottom number is 90 or higher. This must be the result when taking your blood pressure a number of times.The blood pressures between normal and high are called prehypertension.  Home care  If you have high blood pressure, you should do what is listed below to lower your blood pressure. If you are taking medicines for high blood pressure, these methods may reduce or end your need for medicines in the future.   Begin a weight-loss program if you are overweight.   Cut back on how much salt you get in your diet. Here's how to do this:   Don't eat foods that have a lot of salt. These include olives, pickles, smoked meats, and salted potato chips.   Don't add salt to your food at the table.   Use only small amounts of salt when cooking.   Start an exercise program. Talk with your healthcare  provider about the type of exercise program that would be best for you. It doesn't have to be hard. Even brisk walking for 20 minutes 3 times a week is a good form of exercise.   Don't take medicines that stimulate the heart. This includes many over-the-counter cold and sinus decongestant pills and sprays, as well as diet pills. Check the warnings about hypertension on the label. Before buying any over-the-counter medicines or supplements, always ask the pharmacist about the product's potential interaction with your high blood pressure and your high blood pressure medicines.   Stimulants such as amphetamine or cocaine could be deadly for someone with high blood pressure. Never take these.   Limit how much caffeine you get in your diet. Switch to caffeine-free products.   Stop smoking. If you are a long-time smoker, this can be hard. Talk to your healthcare provider about medicines and nicotine replacement options to help you. Also, enroll in a stop-smoking program to make it more likely that you will quit for good.   Learn how to handle stress. This is an important part of any program to lower blood pressure. Learn about relaxation methods like meditation, yoga, or biofeedback.   If your provider prescribed medicines, take them exactly as directed. Missing doses may cause your blood pressure get out of control.   If you miss a dose or doses, check with your healthcare provider or pharmacist about what to do.   Consider buying  an automatic blood pressure machine. Ask your provider for a recommendation. You can get one of these at most pharmacies.    The American Heart Association recommends the following guidelines for home blood pressure monitoring:   Don't smoke or drink coffee for 30 minutes before taking your blood pressure.   Go to the bathroom before the test.   Relax for 5 minutes before taking the measurement.   Sit with your back supported (don't sit on a couch or soft chair); keep your feet on  the floor uncrossed. Place your arm on a solid flat surface (like a table) with the upper part of the arm at heart level. Place the middle of the cuff directly above the eye of the elbow. Check the monitor's instruction manual for an illustration.   Take multiple readings. When you measure, take 2 to 3 readings one minute apart and record all of the results.   Take your blood pressure at the same time every day, or as your healthcare provider recommends.   Record the date, time, and blood pressure reading.   Take the record with you to your next medical appointment. If your blood pressure monitor has a built-in memory, simply take the monitor with you to your next appointment.   Call your provider if you have several high readings. Don't be frightened by a single high blood pressure reading, but if you get several high readings, check in with your healthcare provider.   Note: When blood pressure reaches a systolic (top number) of 99991111 or higher OR diastolic (bottom number) of 110 or higher, seek emergency medical treatment.  Follow-up care  You will need to see your healthcare provider regularly. This is to check your blood pressure and to make changes to your medicines. Make a follow-up appointment as directed. Bring the record of your home blood pressure readings to the appointment.  When to seek medical advice  Call your healthcare provider right away if any of these occur:   Blood pressure reaches a systolic (upper number) of 180 or higher OR a diastolic (bottom number) of 110 or higher   Chest pain or shortness of breath   Severe headache   Throbbing or rushing sound in the ears   Nosebleed   Sudden severe pain in your belly (abdomen)   Extreme drowsiness, confusion, or fainting   Dizziness or spinning sensation (vertigo)   Weakness of an arm or leg or one side of the face   You have problems speaking or seeing  Date Last Reviewed: 06/27/2015   2000-2017 [employer], [place] [address]. All rights reserved. This information is not intended as a substitute for professional medical care. Always follow your healthcare professional's instructions.        Blood in the Urine    Blood in the urine (hematuria) has many possible causes. If it occurs after an injury (such as a car accident or fall), it is most often a sign of bruising to the kidney or bladder. Common causes of blood in the urine include urinary tract infections, kidney stones, inflammation, tumors, or certain other diseases of the kidney or bladder. Menstruation can cause blood to appear in the urine sample, although it is not coming from the urinary tract.  If only a trace amount of blood is present, it will show up on the urine test, even though the urine may be yellow and not pink or red. This may occur with any of  the above conditions, as well as heavy exercise or high fever. In this case, your doctor may want to repeat the urine test on another day. This will show if the blood is still present. If it is, then other tests can be done to find out the cause.  Home care  Follow these home care guidelines:   If your urine does not appear bloody (pink, brown or red) then you do not need to restrict your activity in any way.   If you can see blood in your urine, rest and avoid heavy exertion until your next exam. Do not use aspirin, blood thinners, or anti-platelet or anti-inflammatory medicines. These include ibuprofen and naproxen. These thin the blood and may increase bleeding.  Follow-up care  Follow up with your healthcare provider, or as advised. If you were injured and had blood in your urine, you should have a repeat urine test in 1 to 2 days. Contact your doctor for this test.  A radiologist will review any X-rays that were taken. You will be told of any new findings that may affect your care.  When to seek medical advice  Call your healthcare provider right away if any of these occur:   Bright red  blood or blood clots in the urine (if you did not have this before)   Weakness, dizziness or fainting   New groin, abdominal, or back pain   Fever of 100.20F (38C) or higher, or as directed by your healthcare provider   Repeated vomiting   Bleeding from the nose or gums or easy bruising  Date Last Reviewed: 03/28/2015   2000-2017 [employer], [place] [address]. All rights reserved. This information is not intended as a substitute for professional medical care. Always follow your healthcare professional's instructions.        Bladder Infection,Female (Adult)    Urine is normally doesn't have any bacteria in it. But bacteria can get into the urinary tract from the skin around the rectum. Or they can travel in the blood from elsewhere in the body. Once they are in your urinary tract, they can cause infection in the urethra (urethritis), the bladder (cystitis), or the kidneys (pyelonephritis).  The most common place for an infection is in the bladder. This is called a bladder infection. This is one of the most common infections in women. Most bladder infections are easily treated. They are not serious unless the infection spreads to the kidney.  The phrases "bladder infection," "UTI," and "cystitis" are often used to describe the same thing. But they are not always the same. Cystitis is an inflammation of the bladder. Themost common cause of cystitis is an infection.  Symptoms  The infection causes inflammation in the urethra and bladder. This causes many of the symptoms. The most common symptoms of a bladder infection are:   Pain or burning when urinating   Having to urinate more often than usual   Urgent need to urinate   Only a small amount of urine comes out   Blood in urine   Abdominal discomfort. This is usually in the lower abdomen above the pubic bone.   Cloudy urine   Strong- or bad-smelling urine   Unable to urinate (urinary retention)   Unable to hold  urine in (urinary incontinence)   Fever   Loss of appetite   Confusion (in older adults)  Causes  Bladder infections are not contagious. You can't get one from someone else, from  a toilet seat, or from sharing a bath.  The most common cause of bladder infections is bacteria from the bowels. The bacteria get onto the skin around the opening of the urethra. From there, they can get into the urine and travel up to the bladder, causing inflammation and infection. This usually happens because of:   Wiping improperly after urinating. Always wipe from front to back.   Bowel incontinence   Pregnancy   Procedures such as having a catheter inserted   Older age   Not emptying your bladder. This can allow bacteria a chance to grow in your urine.   Dehydration   Constipation   Sex   Use of a diaphragm for birth control  Treatment  Bladder infections are diagnosed by a urine test. They are treated with antibiotics and usuallyclear up quickly without complications. Treatment helps prevent a more serious kidney infection.  Medicines  Medicines can help in the treatment of a bladder infection:   Take antibiotics until they are used up, even if you feel better. It is important to finish them to make sure the infection has cleared.   You can use acetaminophen or ibuprofen for pain, fever, or discomfort, unless another medicine was prescribed. If you have chronic liver or kidney disease, talk with your healthcareprovider before usingthese medicines. Also talk with your provider if you've ever had a stomach ulcer or gastrointestinal bleeding, or are taking blood-thinner medicines.   If you are givenphenazopydridine to reduce burning with urination, it will cause your urine to become a bright orange color. This can stain clothing.  Care and prevention  These self-care steps can help prevent future infections:   Drink plenty of fluids to prevent dehydration and flush out your bladder. Do thisunless you must restrict  fluids for other health reasons, or your doctor told you not to.   Proper cleaning after going to the bathroom is important. Wipe from front to back after using the toilet to prevent the spread of bacteria.   Urinate more often. Don't try to hold urine in for a long time.   Wear loose-fitting clothes and cotton underwear. Avoid tight-fitting pants.   Improve your diet and prevent constipation. Eat more fresh fruit and vegetables, andfiber, and less junk and fatty foods.   Avoid sex until your symptoms are gone.   Avoid caffeine, alcohol, and spicy foods. These can irritate your bladder.   Urinate right after intercourse to flush out your bladder.   If you use birth control pills and have frequent bladder infections, discuss it with your doctor.  Follow-up care  Call your healthcare provider if all symptoms are not gone after 3 days of treatment. This is especially important if you have repeat infections.  If a culture was done, you will be told if your treatment needs to be changed. If directed, you can callto find out the results.  If X-rays were done, you will be told if the results will affect yourtreatment.  Call 911  Call 911 if any of the following occur:   Trouble breathing   Hard to wake up orconfusion   Fainting or loss of consciousness   Rapid heart rate  When to seek medical advice  Call your healthcare provider right away if any of these occur:   Fever of 100.3F (38.0C) or higher, or as directed by your healthcare provider   Symptoms are not betterby the third day of treatment   Back or belly (abdominal) pain that gets worse  Repeated vomiting, or unable to keep medicine down   Weakness or dizziness   Vaginal discharge   Pain, redness, or swelling in the outer vaginal area (labia)  Date Last Reviewed: 04/27/2015   2000-2017 [employer] [place], [place] [address]. All rights reserved. This information is not intended as a substitute for professional  medical care. Always follow your healthcare professional's instructions.

## 2016-02-12 NOTE — ED Triage Notes (Signed)
Family member states that pt has been having vaginal bleeding that is heavy every month. Pt states that the bleeding this month is heavier. Pt is very tearful in triage.

## 2016-02-12 NOTE — ED Nurses Note (Signed)
Informed pt that all results are back and that the doctor will look at them and make his evaluation. I informed pt that the doctor will be in to see patient. Call bell in reach. No other needs requested at this time.

## 2016-02-12 NOTE — ED Nurses Note (Signed)
Patient on and off the bed pan.

## 2016-02-12 NOTE — ED Nurses Note (Signed)
I assisted Dr Gentle with a pelvic and rectal exam.  Pt tolerated procedures well with out difficulty.

## 2016-02-13 LAB — URINE CULTURE: URINE CULTURE: NO GROWTH

## 2016-02-28 ENCOUNTER — Ambulatory Visit (HOSPITAL_BASED_OUTPATIENT_CLINIC_OR_DEPARTMENT_OTHER)
Admission: RE | Admit: 2016-02-28 | Discharge: 2016-02-28 | Disposition: A | Discharge status: Home / Self Care | Payer: Medicare PPO | Source: Ambulatory Visit | Attending: Urology | Admitting: Urology

## 2016-02-28 ENCOUNTER — Telehealth (INDEPENDENT_AMBULATORY_CARE_PROVIDER_SITE_OTHER): Payer: Self-pay | Admitting: Urology

## 2016-02-28 ENCOUNTER — Encounter (HOSPITAL_BASED_OUTPATIENT_CLINIC_OR_DEPARTMENT_OTHER): Payer: Self-pay

## 2016-02-28 ENCOUNTER — Ambulatory Visit (INDEPENDENT_AMBULATORY_CARE_PROVIDER_SITE_OTHER): Payer: Medicare PPO | Admitting: Urology

## 2016-02-28 ENCOUNTER — Encounter (HOSPITAL_BASED_OUTPATIENT_CLINIC_OR_DEPARTMENT_OTHER): Payer: Self-pay | Admitting: Pain Medicine

## 2016-02-28 ENCOUNTER — Encounter (INDEPENDENT_AMBULATORY_CARE_PROVIDER_SITE_OTHER): Payer: Self-pay | Admitting: Urology

## 2016-02-28 VITALS — BP 165/89 | HR 61 | Temp 97.3°F | Ht 63.0 in | Wt 180.0 lb

## 2016-02-28 VITALS — BP 179/92 | HR 56 | Ht 63.0 in | Wt 182.0 lb

## 2016-02-28 DIAGNOSIS — I639 Cerebral infarction, unspecified: Secondary | ICD-10-CM | POA: Insufficient documentation

## 2016-02-28 DIAGNOSIS — D689 Coagulation defect, unspecified: Secondary | ICD-10-CM | POA: Insufficient documentation

## 2016-02-28 DIAGNOSIS — I209 Angina pectoris, unspecified: Secondary | ICD-10-CM | POA: Insufficient documentation

## 2016-02-28 DIAGNOSIS — Z8669 Personal history of other diseases of the nervous system and sense organs: Secondary | ICD-10-CM | POA: Insufficient documentation

## 2016-02-28 DIAGNOSIS — F809 Developmental disorder of speech and language, unspecified: Secondary | ICD-10-CM | POA: Insufficient documentation

## 2016-02-28 DIAGNOSIS — R319 Hematuria, unspecified: Secondary | ICD-10-CM

## 2016-02-28 DIAGNOSIS — I10 Essential (primary) hypertension: Secondary | ICD-10-CM

## 2016-02-28 DIAGNOSIS — D494 Neoplasm of unspecified behavior of bladder: Secondary | ICD-10-CM

## 2016-02-28 DIAGNOSIS — R0602 Shortness of breath: Secondary | ICD-10-CM | POA: Insufficient documentation

## 2016-02-28 DIAGNOSIS — R479 Unspecified speech disturbances: Secondary | ICD-10-CM | POA: Insufficient documentation

## 2016-02-28 HISTORY — DX: Developmental disorder of speech and language, unspecified: F80.9

## 2016-02-28 HISTORY — DX: Spontaneous ecchymoses: R23.3

## 2016-02-28 HISTORY — DX: Palpitations: R00.2

## 2016-02-28 HISTORY — DX: Angina pectoris, unspecified (CMS HCC): I20.9

## 2016-02-28 HISTORY — DX: Muscle weakness (generalized): M62.81

## 2016-02-28 HISTORY — DX: Shortness of breath: R06.02

## 2016-02-28 HISTORY — DX: Anxiety disorder, unspecified: F41.9

## 2016-02-28 HISTORY — DX: Essential (primary) hypertension: I10

## 2016-02-28 HISTORY — DX: Depression, unspecified: F32.A

## 2016-02-28 HISTORY — DX: Personal history of other diseases of the nervous system and sense organs: Z86.69

## 2016-02-28 HISTORY — DX: Coagulation defect, unspecified (CMS HCC): D68.9

## 2016-02-28 HISTORY — DX: Abnormal electrocardiogram (ECG) (EKG): R94.31

## 2016-02-28 HISTORY — DX: Unspecified osteoarthritis, unspecified site: M19.90

## 2016-02-28 HISTORY — DX: Other skin changes: R23.8

## 2016-02-28 HISTORY — DX: Presence of spectacles and contact lenses: Z97.3

## 2016-02-28 HISTORY — DX: Unspecified cataract: H26.9

## 2016-02-28 HISTORY — DX: Obesity, unspecified: E66.9

## 2016-02-28 MED ORDER — SULFAMETHOXAZOLE 800 MG-TRIMETHOPRIM 160 MG TABLET
1.00 | ORAL_TABLET | Freq: Two times a day (BID) | ORAL | 0 refills | Status: DC
Start: 2016-02-28 — End: 2016-03-27

## 2016-02-28 MED ORDER — CEFAZOLIN 1 GRAM/50 ML IN DEXTROSE (ISO-OSMOTIC) INTRAVENOUS DUPLEX
1.0000 g | INJECTION | Freq: Once | INTRAVENOUS | Status: DC
Start: 2016-03-02 — End: 2016-02-28

## 2016-02-28 MED ORDER — LIDOCAINE 2 % MUCOSAL JELLY IN APPLICATOR
5.0000 mL | Freq: Once | 0 refills | Status: AC
Start: 2016-02-28 — End: 2016-02-28

## 2016-02-28 NOTE — Progress Notes (Signed)
[facility]  [place], [place] [address]    UROLOGY OFFICE VISIT    Ione, Menear, 65 y.o. female  Date of service: 02/28/2016  Date of Birth:  06/14/1951    Chief Complaint:  Johney [place] hematuria    Assessment/Plan:  Gross hematuria secondary to multifocal bladder tumor-plan for TURBT in the near future.    Orders Placed This Encounter    Cystoscopy Procedures    POCT URINE DIPSTICK    lidocaine 2% 2 % Mucous Membrane Jelly in Applicator    trimethoprim-sulfamethoxazole (BACTRIM DS) 160-800mg  per tablet       HPI/Discussion:  Anjelika Beas is a 65 y.o., 1 American female who presents with 1 year history of recurrent bouts of gross painless hematuria.  She reports that the hematuria will resolve on her own and she never mentioned it to anybody until last month when it was much more severe than at other times.  She then presented to the emergency room where CT scan was done which showed a large mass in the bladder which could represent tumor versus clot.  She has a history of cervical cancer treated with radiation therapy and chemo in 2007. She has had her left leg amputated above the knee.    Past Medical History:   Diagnosis Date    Angina pectoris ([place])     mild with exertion    Anxiety     Arthritis     Bruises easily     Cancer ([place]) 2007    cervical ca, tx with chemo and radiation    Cataract     Clotting disorder (HCC)     vena cava filter for DVT    Coronary artery disease     CVA (cerebrovascular accident) ([place]) 02/2014    mild weakness, wheelchair bound post Left AKA    Deep vein thrombosis (DVT) ([place]) 2015    filter in place, L AKA from DVT complications    Depression     H/O hearing loss     hearing aids won't work r/t hx of chronic otitis media without tx    HTN (hypertension) 02/28/2016    currently untreated, pt has apt set up to establish PCP    MI, old     Muscle weakness     Obesity     Palpitations     Shortness of breath      occasional, with moderate exertion    Speech and language deficits     mild aphasia    Wears glasses        Past Surgical History:   Procedure Laterality Date    HX ABOVE KNEE AMPUTATION Left 2015    HX CYSTOSCOPY      HX LAPAROTOMY      ectopic pregnancy with salpingectomy    HX OTHER  02/2014    Heart - Loop monitor, placed in [place]         Cannot display prior to admission medications because the patient has not been admitted in this contact.        No current facility-administered medications for this visit. No Known Allergies  Family History  No family history on file.        Social History  Social History     Social History    Marital status: Single     Spouse name: N/A    Number of children: N/A    Years of education: N/A     Occupational History  Not on file.     Social History Main Topics    Smoking status: Never Smoker    Smokeless tobacco: Not on file    Alcohol use Yes      Comment: rarely    Drug use: No    Sexual activity: Not on file     Other Topics Concern    Ability To Walk 1 Flight Of Steps Without Sob/Cp No     rarely experience chest pain    Unable To Ambulate Yes     pt doesn't wear prosthetic that she owns    Ability To Do Own Adl's Yes     wheelchair bound, able to complete most activities without assistance    Uses Walker Yes     Social History Narrative   ROS:   Positive for fatigue, palpitations, shortness of breath, nausea, diarrhea, slow stream, nocturia, urinary incontinence, hematuria, joint pain, dizziness, depression, and anxiety.    EXAM:  Temperature: 36.3 C (97.3 F)  Heart Rate: 61  BP (Non-Invasive): (!) 165/89  General: appears chronically ill  Eyes: Conjunctiva clear.  HENT:Ear canals normal.  Neck: no thyromegaly  Lungs: normal percussion bilaterally  Cardiovascular: regular rate and rhythm  Abdomen: non-distended  Genito-urinary: normal labia  Extremities: s/p lt aka  Skin: No lesions  Neurologic: Grossly normal  Lymphatics: No  lymphadenopathy  Psychiatric: Affect Normal    Labs:  I have reviewed all lab results.  BMP:   Lab Results   Component Value Date    SODIUM 139 02/12/2016    POTASSIUM 4.1 02/12/2016    CHLORIDE 107 02/12/2016    CO2 24 02/12/2016    BUN 16 02/12/2016    CREATININE 1.09 (H) 02/12/2016    ANIONGAP 8 02/12/2016    GFR >60 02/12/2016    CALCIUM 9.3 02/12/2016     CBC:   Lab Results   Component Value Date    WBC 7.4 02/12/2016    HGB 13.3 02/12/2016    HCT 40.0 02/12/2016    PLTCNT 203 02/12/2016    RBC 4.10 02/12/2016    MCV 97.6 (H) 02/12/2016    MCHC 33.2 02/12/2016    MCH 32.4 02/12/2016    RDW 12.9 02/12/2016    MPV 8.8 02/12/2016     CBC with Differential:   Lab Results   Component Value Date    WBC 7.4 02/12/2016    HGB 13.3 02/12/2016    HCT 40.0 02/12/2016    PLTCNT 203 02/12/2016    RBC 4.10 02/12/2016    MCV 97.6 (H) 02/12/2016    MCHC 33.2 02/12/2016    MCH 32.4 02/12/2016    RDW 12.9 02/12/2016    MPV 8.8 02/12/2016     Routine U/A:   No results found for: [employer], [place], [place], [place], [place], [place], PH, PROTEINUR, UURO, NITRI, LEUKESTUR, REDUCSUBCJ, [place], [facility]        Imaging Studies:  CT reviewed showing large mass in bladder and no evidence of metastatic disease    Marthe Patch, MD 02/28/2016, 13:12

## 2016-02-28 NOTE — Anesthesia Preprocedure Evaluation (Signed)
ANESTHESIA PRE-OP EVALUATION  Review of Systems     Physical Assessment      Plan

## 2016-02-28 NOTE — Nursing Note (Signed)
02/28/16 1100 02/28/16 1153   Medication Administration   Initials JM JM   Medication Name Bactrim DS Lidocaine Jelly   Other Medication --  2%   Medication Dose 800mg / 160 mg 5 ml   Route of Administration PO --    [facility] # [id number] [phone]   LOT # [id number]   Expiration date 03/27/17 09/24/17   Manufacturer [place] Yes Yes   Patient Supplied No No   Comments: cysto cysto

## 2016-02-28 NOTE — Telephone Encounter (Signed)
Pt has appt with to est care with PCP on 03/04/16 am.  Will have them fax clearance to office okaying surgery and will reschedule as soon as we receive.  Pt and daughter stated understanding.  Latricia Heft  02/28/2016, 15:40

## 2016-02-28 NOTE — Discharge Instructions (Signed)
Nothing to eat or drink after midnight on the day of surgery.  DO NOT take any medication the morning of the procedure.

## 2016-02-28 NOTE — Nursing Note (Signed)
02/28/16 1000   Urine   Time collected 1033   Glucose Negative   Bilirubin (!) 3+   Ketones Small (15 mg/dl)   Urine Specific Gravity 1.020   Blood (urine) Large (3+)   pH 5.0   Protein (!) Trace   Urobilinogen Normal    Nitrite Negative   Leukocytes (!) 3+   Initials JM

## 2016-02-28 NOTE — Procedures (Signed)
88 French flexible cystoscope was inserted per urethra into the bladder.  Cystoscopy demonstrated evidence of bladder tumors involving the left as well as anterior wall of the bladder.  There also appeared to be active bleeding from these tumors, but no clots were noted within the bladder.

## 2016-03-02 ENCOUNTER — Ambulatory Visit (HOSPITAL_BASED_OUTPATIENT_CLINIC_OR_DEPARTMENT_OTHER): Admission: RE | Admit: 2016-03-02 | Payer: Medicare PPO | Source: Ambulatory Visit | Admitting: Urology

## 2016-03-02 ENCOUNTER — Encounter (HOSPITAL_BASED_OUTPATIENT_CLINIC_OR_DEPARTMENT_OTHER): Admission: RE | Payer: Self-pay | Source: Ambulatory Visit

## 2016-03-02 SURGERY — RESECTION TUMOR BLADDER TRANSURETHRAL
Anesthesia: General

## 2016-03-04 ENCOUNTER — Telehealth (INDEPENDENT_AMBULATORY_CARE_PROVIDER_SITE_OTHER): Payer: Self-pay | Admitting: Urology

## 2016-03-04 DIAGNOSIS — I2510 Atherosclerotic heart disease of native coronary artery without angina pectoris: Secondary | ICD-10-CM | POA: Diagnosis not present

## 2016-03-04 DIAGNOSIS — I10 Essential (primary) hypertension: Secondary | ICD-10-CM | POA: Diagnosis not present

## 2016-03-04 NOTE — Telephone Encounter (Signed)
Pt established care with Dr. Samule Dry at [facility] at [facility] today.  Pt will see a cardiologist at [facility] tomorrow to establish care as well.  Pt stated that PCP wanted a copy of the medical clearance request faxed to office so that they could work on providing [place] with the needed documentation.  Clearance request was faxed to PCP office at [phone] as requested. Pt will let us know of any issues that may arise when she sees cardiologist tomorrow.  Once clearance is received TURBT will be scheduled.  Pt stated understanding.  Latricia Heft  03/04/2016, 15:40

## 2016-03-05 DIAGNOSIS — I252 Old myocardial infarction: Secondary | ICD-10-CM | POA: Diagnosis not present

## 2016-03-05 DIAGNOSIS — R0609 Other forms of dyspnea: Secondary | ICD-10-CM | POA: Diagnosis not present

## 2016-03-05 DIAGNOSIS — R079 Chest pain, unspecified: Secondary | ICD-10-CM | POA: Diagnosis not present

## 2016-03-05 DIAGNOSIS — R319 Hematuria, unspecified: Secondary | ICD-10-CM | POA: Diagnosis not present

## 2016-03-05 DIAGNOSIS — Z9119 Patient's noncompliance with other medical treatment and regimen: Secondary | ICD-10-CM | POA: Diagnosis not present

## 2016-03-12 DIAGNOSIS — R0602 Shortness of breath: Secondary | ICD-10-CM | POA: Diagnosis not present

## 2016-03-12 DIAGNOSIS — I6523 Occlusion and stenosis of bilateral carotid arteries: Secondary | ICD-10-CM | POA: Diagnosis not present

## 2016-03-12 DIAGNOSIS — E785 Hyperlipidemia, unspecified: Secondary | ICD-10-CM | POA: Diagnosis not present

## 2016-03-12 DIAGNOSIS — I499 Cardiac arrhythmia, unspecified: Secondary | ICD-10-CM | POA: Diagnosis not present

## 2016-03-12 DIAGNOSIS — I10 Essential (primary) hypertension: Secondary | ICD-10-CM | POA: Diagnosis not present

## 2016-03-15 LAB — ECG 12-LEAD
Atrial Rate: 60 {beats}/min
Calculated P Axis: 42 degrees
Calculated R Axis: -22 degrees
Calculated T Axis: -4 degrees
Calculated T Axis: -4 degrees
PR Interval: 144 ms
PR Interval: 144 ms
QRS Duration: 142 ms
QRS Duration: 142 ms
QT Interval: 478 ms
QTC Calculation: 478 ms
QTC Calculation: 478 ms
Ventricular rate: 60 {beats}/min

## 2016-03-18 DIAGNOSIS — I252 Old myocardial infarction: Secondary | ICD-10-CM | POA: Diagnosis not present

## 2016-03-18 DIAGNOSIS — Z01810 Encounter for preprocedural cardiovascular examination: Secondary | ICD-10-CM | POA: Diagnosis not present

## 2016-03-18 DIAGNOSIS — R0602 Shortness of breath: Secondary | ICD-10-CM | POA: Diagnosis not present

## 2016-03-18 DIAGNOSIS — I259 Chronic ischemic heart disease, unspecified: Secondary | ICD-10-CM | POA: Diagnosis not present

## 2016-03-18 DIAGNOSIS — R079 Chest pain, unspecified: Secondary | ICD-10-CM | POA: Diagnosis not present

## 2016-03-19 NOTE — Nurses Notes (Signed)
Received copy of stress test from 03/18/16-Dr. Hardin Negus reviewed-stress test, shows reversible ischemia-need note from Dr. Marsa Aris clearing patient in regards to stress test report;  Dr. Valerie Salts office notified by phone of this

## 2016-03-26 ENCOUNTER — Telehealth (INDEPENDENT_AMBULATORY_CARE_PROVIDER_SITE_OTHER): Payer: Self-pay | Admitting: Urology

## 2016-03-26 MED ORDER — CEFAZOLIN 1 GRAM/50 ML IN DEXTROSE (ISO-OSMOTIC) INTRAVENOUS DUPLEX
1.0000 g | INJECTION | Freq: Once | INTRAVENOUS | Status: AC
Start: 2016-03-27 — End: 2016-03-27
  Administered 2016-03-27: 1 g via INTRAVENOUS
  Filled 2016-03-26: qty 50

## 2016-03-26 MED ORDER — LACTATED RINGERS INTRAVENOUS SOLUTION
INTRAVENOUS | Status: DC
Start: 2016-03-27 — End: 2016-03-27

## 2016-03-27 ENCOUNTER — Ambulatory Visit (HOSPITAL_BASED_OUTPATIENT_CLINIC_OR_DEPARTMENT_OTHER): Payer: Medicare PPO

## 2016-03-27 ENCOUNTER — Encounter (HOSPITAL_BASED_OUTPATIENT_CLINIC_OR_DEPARTMENT_OTHER)
Admission: RE | Disposition: A | Discharge status: Home / Self Care | Payer: Self-pay | Source: Ambulatory Visit | Attending: Urology

## 2016-03-27 ENCOUNTER — Other Ambulatory Visit (HOSPITAL_COMMUNITY): Payer: Self-pay | Admitting: Urology

## 2016-03-27 ENCOUNTER — Encounter (HOSPITAL_BASED_OUTPATIENT_CLINIC_OR_DEPARTMENT_OTHER): Payer: Self-pay | Admitting: Anesthesiology

## 2016-03-27 ENCOUNTER — Inpatient Hospital Stay (HOSPITAL_BASED_OUTPATIENT_CLINIC_OR_DEPARTMENT_OTHER)
Admission: RE | Admit: 2016-03-27 | Discharge: 2016-03-27 | Disposition: A | Discharge status: Home / Self Care | Payer: Medicare PPO | Source: Ambulatory Visit | Attending: Urology | Admitting: Urology

## 2016-03-27 ENCOUNTER — Ambulatory Visit (HOSPITAL_BASED_OUTPATIENT_CLINIC_OR_DEPARTMENT_OTHER): Payer: Medicare PPO | Admitting: Certified Registered"

## 2016-03-27 DIAGNOSIS — D689 Coagulation defect, unspecified: Secondary | ICD-10-CM | POA: Insufficient documentation

## 2016-03-27 DIAGNOSIS — D494 Neoplasm of unspecified behavior of bladder: Secondary | ICD-10-CM

## 2016-03-27 DIAGNOSIS — Z79899 Other long term (current) drug therapy: Secondary | ICD-10-CM | POA: Insufficient documentation

## 2016-03-27 DIAGNOSIS — I2510 Atherosclerotic heart disease of native coronary artery without angina pectoris: Secondary | ICD-10-CM | POA: Insufficient documentation

## 2016-03-27 DIAGNOSIS — Z8673 Personal history of transient ischemic attack (TIA), and cerebral infarction without residual deficits: Secondary | ICD-10-CM | POA: Insufficient documentation

## 2016-03-27 DIAGNOSIS — I209 Angina pectoris, unspecified: Secondary | ICD-10-CM | POA: Insufficient documentation

## 2016-03-27 DIAGNOSIS — Z6832 Body mass index (BMI) 32.0-32.9, adult: Secondary | ICD-10-CM | POA: Insufficient documentation

## 2016-03-27 DIAGNOSIS — I252 Old myocardial infarction: Secondary | ICD-10-CM | POA: Insufficient documentation

## 2016-03-27 DIAGNOSIS — C679 Malignant neoplasm of bladder, unspecified: Secondary | ICD-10-CM

## 2016-03-27 DIAGNOSIS — I4510 Unspecified right bundle-branch block: Secondary | ICD-10-CM | POA: Insufficient documentation

## 2016-03-27 DIAGNOSIS — Z95828 Presence of other vascular implants and grafts: Secondary | ICD-10-CM | POA: Insufficient documentation

## 2016-03-27 DIAGNOSIS — C672 Malignant neoplasm of lateral wall of bladder: Secondary | ICD-10-CM | POA: Insufficient documentation

## 2016-03-27 DIAGNOSIS — Z9889 Other specified postprocedural states: Secondary | ICD-10-CM | POA: Insufficient documentation

## 2016-03-27 DIAGNOSIS — E669 Obesity, unspecified: Secondary | ICD-10-CM | POA: Insufficient documentation

## 2016-03-27 DIAGNOSIS — C674 Malignant neoplasm of posterior wall of bladder: Secondary | ICD-10-CM | POA: Insufficient documentation

## 2016-03-27 DIAGNOSIS — Z86718 Personal history of other venous thrombosis and embolism: Secondary | ICD-10-CM | POA: Insufficient documentation

## 2016-03-27 DIAGNOSIS — I10 Essential (primary) hypertension: Secondary | ICD-10-CM | POA: Insufficient documentation

## 2016-03-27 DIAGNOSIS — Z8541 Personal history of malignant neoplasm of cervix uteri: Secondary | ICD-10-CM | POA: Insufficient documentation

## 2016-03-27 DIAGNOSIS — Z9221 Personal history of antineoplastic chemotherapy: Secondary | ICD-10-CM | POA: Insufficient documentation

## 2016-03-27 DIAGNOSIS — Z923 Personal history of irradiation: Secondary | ICD-10-CM | POA: Insufficient documentation

## 2016-03-27 DIAGNOSIS — R319 Hematuria, unspecified: Secondary | ICD-10-CM

## 2016-03-27 DIAGNOSIS — Z89612 Acquired absence of left leg above knee: Secondary | ICD-10-CM | POA: Insufficient documentation

## 2016-03-27 SURGERY — RESECTION TUMOR BLADDER TRANSURETHRAL
Anesthesia: General | Site: Bladder | Wound class: Clean Contaminated Wounds-The respiratory, GI, Genital, or urinary

## 2016-03-27 MED ORDER — LIDOCAINE (PF) 100 MG/5 ML (2 %) INTRAVENOUS SYRINGE
INJECTION | Freq: Once | INTRAVENOUS | Status: DC | PRN
Start: 2016-03-27 — End: 2016-03-27
  Administered 2016-03-27: 60 mg via INTRAVENOUS

## 2016-03-27 MED ORDER — HYDROMORPHONE 1 MG/ML INJECTION WRAPPER
0.5000 mg | INJECTION | INTRAMUSCULAR | Status: DC | PRN
Start: 2016-03-27 — End: 2016-03-27

## 2016-03-27 MED ORDER — ONDANSETRON HCL (PF) 4 MG/2 ML INJECTION SOLUTION
Freq: Once | INTRAMUSCULAR | Status: DC | PRN
Start: 2016-03-27 — End: 2016-03-27
  Administered 2016-03-27: 4 mg via INTRAVENOUS

## 2016-03-27 MED ORDER — PHENAZOPYRIDINE 100 MG TABLET
100.0000 mg | ORAL_TABLET | Freq: Three times a day (TID) | ORAL | 0 refills | Status: AC
Start: 2016-03-27 — End: 2016-04-01

## 2016-03-27 MED ORDER — ACETAMINOPHEN 1,000 MG/100 ML (10 MG/ML) INTRAVENOUS SOLUTION
1000.0000 mg | Freq: Once | INTRAVENOUS | Status: DC | PRN
Start: 2016-03-27 — End: 2016-03-27
  Administered 2016-03-27: 1000 mg via INTRAVENOUS
  Filled 2016-03-27: qty 100

## 2016-03-27 MED ORDER — FENTANYL (PF) 50 MCG/ML INJECTION SOLUTION
Freq: Once | INTRAMUSCULAR | Status: DC | PRN
Start: 2016-03-27 — End: 2016-03-27
  Administered 2016-03-27: 100 ug via INTRAVENOUS

## 2016-03-27 MED ORDER — EPHEDRINE SULFATE 50 MG/ML INTRAVENOUS SOLUTION
Freq: Once | INTRAVENOUS | Status: DC | PRN
Start: 2016-03-27 — End: 2016-03-27
  Administered 2016-03-27: 10 mg via INTRAVENOUS

## 2016-03-27 MED ORDER — PROMETHAZINE 12.5MG IN NS 50ML PREMIX
12.5000 mg | INJECTION | Freq: Once | INTRAVENOUS | Status: DC | PRN
Start: 2016-03-27 — End: 2016-03-27

## 2016-03-27 MED ORDER — PROPOFOL 10 MG/ML IV BOLUS
INJECTION | Freq: Once | INTRAVENOUS | Status: DC | PRN
Start: 2016-03-27 — End: 2016-03-27
  Administered 2016-03-27: 150 mg via INTRAVENOUS

## 2016-03-27 MED ORDER — SODIUM CHLORIDE 0.9 % IRRIGATION SOLUTION
3000.0000 mL | Freq: Once | Status: AC
Start: 2016-03-27 — End: 2016-03-27
  Administered 2016-03-27: 5000 mL

## 2016-03-27 MED ORDER — GLYCOPYRROLATE 0.2 MG/ML INJECTION SOLUTION
Freq: Once | INTRAMUSCULAR | Status: DC | PRN
Start: 2016-03-27 — End: 2016-03-27
  Administered 2016-03-27: 0.2 mg via INTRAVENOUS

## 2016-03-27 MED ORDER — DEXAMETHASONE SODIUM PHOSPHATE 4 MG/ML INJECTION SOLUTION
Freq: Once | INTRAMUSCULAR | Status: DC | PRN
Start: 2016-03-27 — End: 2016-03-27
  Administered 2016-03-27: 4 mg via INTRAVENOUS

## 2016-03-27 MED ADMIN — artificial tears with lanolin eye ointment: INTRAVENOUS | @ 10:00:00 | NDC 00023031204

## 2016-03-27 MED ADMIN — PANTOPRAZOLE 80MG IN NS 100ML CONTINUOUS INFUSION: INTRAVENOUS | @ 10:00:00 | NDC 00008400101

## 2016-03-27 SURGICAL SUPPLY — 20 items
BAG DRAIN 2000ML SAF-FLOW RND TEARDRP BCTRST STRL LF  DISP (MISCELLANEOUS PT CARE ITEMS) ×1 IMPLANT
BAG DRAIN URINE 2 LITER_154006 W/SAF FLOW OUTLET 20/CS (MISCELLANEOUS PT CARE ITEMS) ×1
ELECTRODE ESURG 12-30D MED LOOP 24FR PLASMALOOP STRL DISP HFRQ RESCT LF (ENDOSCOPIC SUPPLIES) ×1 IMPLANT
GOWN SURG XL AAMI L4 IMPRV REI NF BRTHBL STRL LF DISP CNVRT (PROTECTIVE PRODUCTS/GARMENTS) ×2 IMPLANT
LOOP MED W/CABLE FOR PVT_WA22606S 12IN TELESCOPE 5/BX (INSTRUMENTS ENDOMECHANICAL) ×2
PACK CYSTO 29480 CS/14 (UROLOGICAL SUPPLIES) ×2 IMPLANT
PAD RELEASE 3 X 4 IN_1050 50/BX (WOUND CARE SUPPLY) ×1 IMPLANT
PAD RELEASE 3 X 4 IN_1050 50/BX (WOUND CARE/ENTEROSTOMAL SUPPLY) ×1
SET .28IN 78IN GRVTY FLOW LRG BORE TUBE IRRG Y UROMATIC ARTHMTC TUR STRL LF (IV TUBING & ACCESSORIES) ×1 IMPLANT
SET .28IN 78IN GRVTY FLOW LRG_BORE TUBE IRRG Y UROMATIC (IV TUBING & ACCESSORIES) ×1
SOL IRRG 0.9% NACL 3L PLASTIC CONTAINR UROMATIC LF (SOLUTIONS) ×3 IMPLANT
SOL IRRG 0.9% NACL 500ML PLASTIC PR BTL ISTNC N-PYRG STRL LF (SOLUTIONS) ×1 IMPLANT
SOLUTION IRRG NS 3000CC 2B7127_4/CS (SOLUTIONS) ×3
SOLUTION IRRG NS 500CC 2F7123_18/CS (SOLUTIONS) ×1
SPONGE GAUZE STRL 4 X 4IN TUB_6939 1280/CS (WOUND CARE SUPPLY) ×1 IMPLANT
SPONGE GAUZE STRL 4 X 4IN TUB_6939 1280/CS (WOUND CARE/ENTEROSTOMAL SUPPLY) ×1
SYRINGE AMSURE 60ML LF  STRL THB CONTROL RING LEUR TIP ADPR CATH PRTC IRRG (UROLOGICAL SUPPLIES) ×1 IMPLANT
SYRINGE AMSURE 60ML LF STRL_THB CONTROL RING LEUR TIP ADPR (UROLOGICAL SUPPLIES) ×1
SYRINGE LL 10ML LF  STRL GRAD N-PYRG DEHP-FR PVC FREE MED DISP (NEEDLES & SYRINGE SUPPLIES) IMPLANT
SYRINGE LL 10ML LF STRL MED D_ISP (NEEDLES & SYRINGE SUPPLIES)

## 2016-03-27 NOTE — H&P (Signed)
[place], [place] [address]       Short stay H&P    Tami Marshall, 65 y.o. female, Black/African American  Date of Birth:  18-Nov-1950  Date of Admission:  03/27/2016  PCP: No Established Pcp    HPI   Tami Marshall is a 65 y.o. female who presents with gross hematuria and discovered to have bladder tumor.  CT a/p neg for metastatic disease.    PAST MEDICAL/ FAMILY/ SOCIAL HISTORY:    Past Medical History:   Diagnosis Date    Abnormal EKG     R BBB, Stemi 06/2011, MI 02/2014    Angina pectoris (HCC)     mild with exertion    Anxiety     Arthritis     Bruises easily     Cancer ([place]) 2007    cervical ca, tx with chemo and radiation    Cataract     Clotting disorder ([place])     vena cava filter for DVT    Coronary artery disease     CVA (cerebrovascular accident) ([place]) 02/2014    mild weakness, wheelchair bound post Left AKA    Deep vein thrombosis (DVT) ([place]) 2015    filter in place, L AKA from DVT complications    Depression     H/O hearing loss     hearing aids won't work r/t hx of chronic otitis media without tx    H/O transesophageal echocardiography (TEE) for monitoring 03/08/2014    HTN (hypertension) 02/28/2016    currently untreated, pt has apt set up to establish PCP    MI, old     Muscle weakness     Obesity     Palpitations     Shortness of breath     occasional, with moderate exertion    Speech and language deficits     mild aphasia    Wears glasses          Past Surgical History:   Procedure Laterality Date    HX ABOVE KNEE AMPUTATION Left 2015    HX COLONOSCOPY  04/2014    HX CYSTOSCOPY      HX HEART CATHETERIZATION  07/03/2011    [facility]    HX LAPAROTOMY      ectopic pregnancy with salpingectomy    HX OTHER  02/2014    Heart - Loop monitor, placed in [place]     HX UPPER ENDOSCOPY  05/21/2014         Family History:     Social History   Substance Use Topics    Smoking status: Never Smoker    Smokeless tobacco: Not on file    Alcohol use Yes        Comment: rarely     No Known Allergies  Medications Prior to Admission     Prescriptions    amLODIPine (NORVASC) 5 mg Oral Tablet    Take 5 mg by mouth Once a day    sertraline (ZOLOFT) 25 mg Oral Tablet    Take 25 mg by mouth Once a day    trimethoprim-sulfamethoxazole (BACTRIM DS) 160-800mg  per tablet    Take 1 Tab (160 mg total) by mouth Every 12 hours           ROS/Physical exam    Vitals  Filed Vitals:    03/27/16 0840   BP: (!) 157/83   Pulse: 62   Resp: 16   Temp: 36.6 C (97.9 F)   SpO2: 96%  Physical exam:  General:  Normal  HEENT:  Normal  Cardiovascular:  Normal  Lungs:  Normal  Breasts:  Normal  Abdomen:  Normal  Genitalia:  Normal  Extremities:  lt aka  Skin:  Normal  Neurologic:  Normal    Labs  Lab Results for Last 24 Hours:  No results found for any visits on 03/27/16 (from the past 24 hour(s)).    IMPRESSION & PLAN:     Plan for turbt  Marthe Patch, MD

## 2016-03-27 NOTE — OR Surgeon (Signed)
[facility]   [place], [place] [address]   [phone]   OPERATIVE SUMMARY   PATIENT NAME: Tami Marshall NUMBER: [id number]  DATE OF SERVICE: 03/27/2016  DATE OF BIRTH: 02-Jun-1951  PROCEDURE: TURBT- MEDIUM  SURGEON: Dr. Kathline Magic  ASSISTANT: None   ANESTHESIA: LMA  ESTIMATED BLOOD LOSS: 50 cc   DRAINS: none.   COMPLICATIONS: None   SPECIMENS: Bladder tumor   INDICATIONS: Tami Marshall is a very pleasant 65 y.o. female who presented with hematuria and now presents for turbt. We had a discussion about the risks, benefits and alternatives including the risks of infection, bleeding, damage to nearby structures, need for further procedure as well as the possibility of urinary retention. The patient expressed understanding of this and has agreed to go forth with surgery.     DESCRIPTION OF PROCEDURE: The patient was taken to the operating suite where she underwent general anesthesia. The patient was given  ancef for infection prophylaxis. She underwent placement of sequential compression devices for deep venous thrombosis prophylaxis. The patient was placed in the dorsal lithotomy position with all pressure points padded. A surgical timeout was performed and the patient's identity and procedure was confirmed by the entire surgical team. The 24 French resectoscope sheath was introduced with the visaul obturator. She was noted to have several superficial appearing bladder tumors - medium sized.  These were resected using a rigid cold cup biopsy forceps and a bipolar loop.  The tumors were located primarily on the posterior and lateral walls of the bladder.  All of the visible tumor was resected or fulgurated.    At the end of the case, there was some light pink to clear urine seen. The patient was returned to the supine position, was awakened from anesthesia and brought to the recovery room in good condition.

## 2016-03-27 NOTE — Nurses Notes (Signed)
RN can lock up belongings while in surgery, patient understands any missing belongings are patients responsibility. Patient instructed to please send any valuable items with family/visitor to ensure no lost or stolen items.

## 2016-03-27 NOTE — Anesthesia Preprocedure Evaluation (Signed)
ANESTHESIA PRE-OP EVALUATION  Review of Systems     anesthesia history negative    patient summary reviewed  nursing notes reviewed        Pulmonary  negative pulmonary ROS   Cardiovascular    Hypertension, poorly controlled, CAD, ECG reviewed No peripheral edema Exercise Tolerance: poor       GI/Hepatic/Renal   negative GI/hepatic/renal ROS    Endo/Other   negative endo/other ROS      Neuro/Psych/MS  CVA, residual symptoms  Cancer  CA                              Physical Assessment      Patient summary reviewed and Nursing notes reviewed   Airway       Mallampati: III    TM distance: >3 FB    Neck ROM: limited  Mouth Opening: fair.            Dental           (+) poor dentition, chipped           Pulmonary    Breath sounds clear to auscultation  (-) no rhonchi, no decreased breath sounds, no wheezes, no rales and no stridor     Cardiovascular    Rhythm: regular  Rate: Normal  (-) no friction rub, carotid bruit is not present, no peripheral edema and no murmur     Other findings  Cardiology note and stress test reviewed from 8/23 - 8/24. Patient had a fixed defect on stress imaging and a reversible area c/w ischemia. Per the cardiologist's note she may require cardiac catheterization in the future but that should be delayed due to surgery today.           Plan  Planned anesthesia type: GA /LMA  ASA 4     Intravenous induction   Anesthetic plan and risks discussed with patient and other.     Anesthesia issues/risks discussed are: Cardiac Events/MI, Dental Injuries, Nerve Injuries, Eye /Visual Loss, PONV and Sore Throat.        Patient's NPO status is appropriate for Anesthesia.         Plan discussed with CRNA.    ([place]; explained to patient and daughter that she is at increased risk of perioperative cardiac morbidity/mortality given results of recent stress examination - they voiced understanding and agree to proceed)

## 2016-03-27 NOTE — Nurses Notes (Signed)
Patient blood pressure is consistently [id number] systolic and [id number] diastolic. Heart rate is 70s and regular. Patient denies pain. Discussed with Dr. Debbe Odea, advised okay for discharge with stable heart rate in 70s.

## 2016-03-27 NOTE — Anesthesia Postprocedure Evaluation (Signed)
Anesthesia Post Op Evaluation    Patient: Tami Marshall  Procedure(s) Performed:Procedure(s):  RESECTION TUMOR BLADDER TRANSURETHRAL  Last Vitals:Temperature: 36.7 C (98.1 F) (03/27/16 1038)  Heart Rate: 73 (03/27/16 1154)  BP (Non-Invasive): (!) 166/92 (03/27/16 1154)  Respiratory Rate: 18 (03/27/16 1154)  SpO2-1: 93 % (03/27/16 1154)  Pain Score (Numeric, Faces): 0 (03/27/16 1154)         Patient participation: complete - patient participated  Level of consciousness: awake  Pain management: adequate  Airway patency: patent  Anesthetic complications: no  Cardiovascular status: acceptable  Respiratory status: acceptable  Hydration status: acceptable      Comments: Patient discharged by ods nurse per anesthesia protocols.

## 2016-03-27 NOTE — Discharge Instructions (Signed)
[facility]  [place], [place] [address]     Anesthesia Discharge Instructions    1.  Do NOT drive an automobile today.    2.  Do NOT sign any legal documents for 24 hours.    3.  Do NOT operate any electrical equipment today.    Fall risk prevention education has been reviewed with the patient/significant other.

## 2016-03-27 NOTE — Care Plan (Signed)
Problem: Patient Care Overview (Adult,OB)  Goal: Plan of Care Review(Adult,OB)  The patient and/or their representative will communicate an understanding of their plan of care   Outcome: Adequate for Discharge Date Met:  03/27/16  Goal: Individualization/Patient Specific Goal(Adult/OB)  Outcome: Adequate for Discharge Date Met:  03/27/16    Problem: Anxiety (Adult)  Goal: Identify Related Risk Factors and Signs and Symptoms  Related risk factors and signs and symptoms are identified upon initiation of Human Response Clinical Practice Guideline (CPG).   Outcome: Adequate for Discharge Date Met:  03/27/16  Goal: Reduction/Resolution  Patient will demonstrate the desired outcomes by discharge/transition of care.   Outcome: Adequate for Discharge Date Met:  03/27/16    Problem: Fear (Adult)  Goal: Identify Related Risk Factors and Signs and Symptoms  Related risk factors and signs and symptoms are identified upon initiation of Human Response Clinical Practice Guideline (CPG).   Outcome: Adequate for Discharge Date Met:  03/27/16  Goal: Self-Control  Patient will demonstrate the desired outcomes by discharge/transition of care.   Outcome: Adequate for Discharge Date Met:  03/27/16    Problem: Thought Process Alteration (Adult)  Goal: Identify Related Risk Factors and Signs and Symptoms  Related risk factors and signs and symptoms are identified upon initiation of Human Response Clinical Practice Guideline (CPG).   Outcome: Adequate for Discharge Date Met:  03/27/16  Goal: Improved Thought Process  Patient will demonstrate the desired outcomes by discharge/transition of care.   Outcome: Adequate for Discharge Date Met:  03/27/16    Problem: Health Knowledge, Opportunity to Enhance (Adult,Obstetrics,Pediatric)  Goal: Identify Related Risk Factors and Signs and Symptoms  Related risk factors and signs and symptoms are identified upon initiation of Human Response Clinical Practice Guideline (CPG).   Outcome: Outcome Achieved  Date Met:  03/27/16  Goal: Knowledgeable about Health Subject/Topic  Patient will demonstrate the desired outcomes by discharge/transition of care.   Outcome: Outcome Achieved Date Met:  03/27/16

## 2016-03-27 NOTE — Nurses Notes (Signed)
Dr. Karle Barr to bedside at 1130, discussed procedure with patient and daughter. Patient discharged home with family.  AVS reviewed with patient/care giver.  A written copy of the AVS and discharge instructions was given to the patient/care giver.  Questions sufficiently answered as needed.  Patient/care giver encouraged to follow up with PCP as indicated.  In the event of an emergency, patient/care giver instructed to call 911 or go to the nearest emergency room.   Marland Kitchen

## 2016-03-27 NOTE — Care Plan (Signed)
Problem: Perioperative Period (Adult)  Prevent and manage potential problems including:1. bleeding2. gastrointestinal complications3. hypothermia4. infection5. pain6. perioperative injury7. respiratory compromise8. situational response9. urinary retention10. venous thromboembolism11. wound complications   Goal: Signs and Symptoms of Listed Potential Problems Will be Absent or Manageable (Perioperative Period)  Signs and symptoms of listed potential problems will be absent or manageable by discharge/transition of care (reference Perioperative Period (Adult) CPG).   Outcome: Adequate for Discharge Date Met:  03/27/16

## 2016-03-27 NOTE — Anesthesia Transfer of Care (Signed)
ANESTHESIA TRANSFER OF CARE   Tami Marshall is a 65 y.o. ,female, Weight: 83 kg (183 lb)   had Procedure(s):  RESECTION TUMOR BLADDER TRANSURETHRAL  performed  03/27/16   Primary Service: Marthe Patch, MD    Past Medical History:   Diagnosis Date    Abnormal EKG     R BBB, Stemi 06/2011, MI 02/2014    Angina pectoris ([place])     mild with exertion    Anxiety     Arthritis     Bruises easily     Cancer ([place]) 2007    cervical ca, tx with chemo and radiation    Cataract     Clotting disorder ([place])     vena cava filter for DVT    Coronary artery disease     CVA (cerebrovascular accident) ([place]) 02/2014    mild weakness, wheelchair bound post Left AKA    Deep vein thrombosis (DVT) ([place]) 2015    filter in place, L AKA from DVT complications    Depression     H/O hearing loss     hearing aids won't work r/t hx of chronic otitis media without tx    H/O transesophageal echocardiography (TEE) for monitoring 03/08/2014    HTN (hypertension) 02/28/2016    currently untreated, pt has apt set up to establish PCP    Muscle weakness     Obesity     Palpitations     Shortness of breath     occasional, with moderate exertion    Speech and language deficits     mild aphasia    Wears glasses       Allergy History as of 03/27/16      No Known Allergies              I completed my transfer of care / handoff to the receiving personnel during which we discussed:  All key/critical aspects of case discussed                                            Additional Info:Transferred to pacu report given to emily                    Last OR Temp: Temperature: 36.5 C (97.7 F)  ABG:  POTASSIUM   Date Value Ref Range Status   02/12/2016 4.1 3.4 - 5.1 mmol/L Final     KETONES   Date Value Ref Range Status   02/12/2016 Negative Negative mg/dL Final     CALCIUM   Date Value Ref Range Status   02/12/2016 9.3 8.8 - 10.2 mg/dL Final     Calculated P Axis   Date Value Ref Range Status   02/28/2016 42 degrees Final     Calculated R  Axis   Date Value Ref Range Status   02/28/2016 -22 degrees Final     Calculated T Axis   Date Value Ref Range Status   02/28/2016 -4 degrees Final     Airway:   Blood pressure (!) 194/101, pulse (!) 102, temperature 36.5 C (97.7 F), resp. rate 12, height 1.6 m (5\' 3" ), weight 83 kg (183 lb), SpO2 100 %.

## 2016-03-31 LAB — HISTORICAL SURGICAL PATHOLOGY SPECIMEN

## 2016-04-01 ENCOUNTER — Other Ambulatory Visit (HOSPITAL_BASED_OUTPATIENT_CLINIC_OR_DEPARTMENT_OTHER): Payer: Medicare PPO | Attending: Urology

## 2016-04-01 ENCOUNTER — Encounter (INDEPENDENT_AMBULATORY_CARE_PROVIDER_SITE_OTHER): Payer: Self-pay | Admitting: Urology

## 2016-04-01 ENCOUNTER — Ambulatory Visit (INDEPENDENT_AMBULATORY_CARE_PROVIDER_SITE_OTHER): Payer: Medicare PPO | Admitting: Urology

## 2016-04-01 VITALS — BP 150/83 | HR 58 | Temp 98.3°F | Ht 62.0 in | Wt 183.0 lb

## 2016-04-01 DIAGNOSIS — R300 Dysuria: Secondary | ICD-10-CM

## 2016-04-01 DIAGNOSIS — R3914 Feeling of incomplete bladder emptying: Secondary | ICD-10-CM

## 2016-04-01 MED ORDER — CEPHALEXIN 500 MG CAPSULE
500.00 mg | ORAL_CAPSULE | Freq: Four times a day (QID) | ORAL | 0 refills | Status: AC
Start: 2016-04-01 — End: 2016-04-02

## 2016-04-01 NOTE — Nursing Note (Signed)
04/01/16 1100   Medication Administration   Initials JM   Medication Name Keflex 500   Medication Dose 500 mg   Route of Administration PO   NDC # [id number]   LOT # [id number]   Expiration date 07/25/16   Manufacturer [place] Yes   Patient Supplied No   Comments: given in office for positive UA

## 2016-04-01 NOTE — Progress Notes (Signed)
Patient presents for follow-up status post TURBT.  Pathology was reviewed with her and her daughter.  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  She is having more pain with voiding than expected following the procedure, so her urine will be sent off for culture.  She is taking Pyridium so urinalysis in the office is not reliable.  If infection is present will start her on antibiotics.  We discussed treatment options for her newly diagnosed bladder cancer, and will plan on doing a surveillance cystoscopy in 3 months time.  All of her questions were answered.  Over 50 percent of a 25 minutes visit was spent counseling and coordinating her care.

## 2016-04-01 NOTE — Nursing Note (Signed)
04/01/16 1100   Urine   Time collected 1102   Glucose Trace (100mg /dl)   Bilirubin (!) 1+   Ketones Trace (5 mg/dl)   Urine Specific Gravity 1.015   Blood (urine) Moderate (2+)   pH 5.5   Protein (!) 2+ (100mg /dL)   Urobilinogen 2mg /dL   Nitrite (!) Positive   Leukocytes Trace   PVR Volume 10 ml   Initials JM

## 2016-04-02 LAB — URINE CULTURE: URINE CULTURE: NO GROWTH

## 2016-04-16 DIAGNOSIS — I10 Essential (primary) hypertension: Secondary | ICD-10-CM | POA: Diagnosis not present

## 2016-04-16 DIAGNOSIS — Z23 Encounter for immunization: Secondary | ICD-10-CM | POA: Diagnosis not present

## 2016-05-21 IMAGING — US US EXTREM LOW VENOUS*L*
1 series · 13 of 24 positions shown · non-contrast
Comparison: None.

CLINICAL DATA: Left leg pain



[Series 1: us extrem low venous*left* · 13 of 26 slices shown]
[im 1/26]
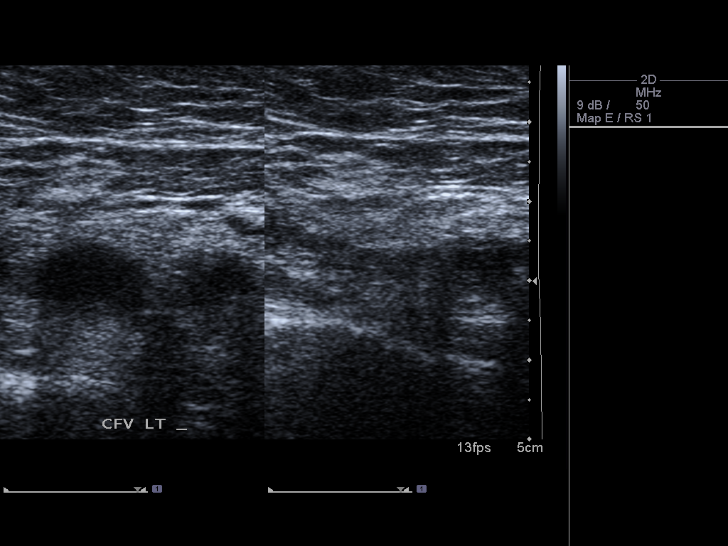
[im 3/26]
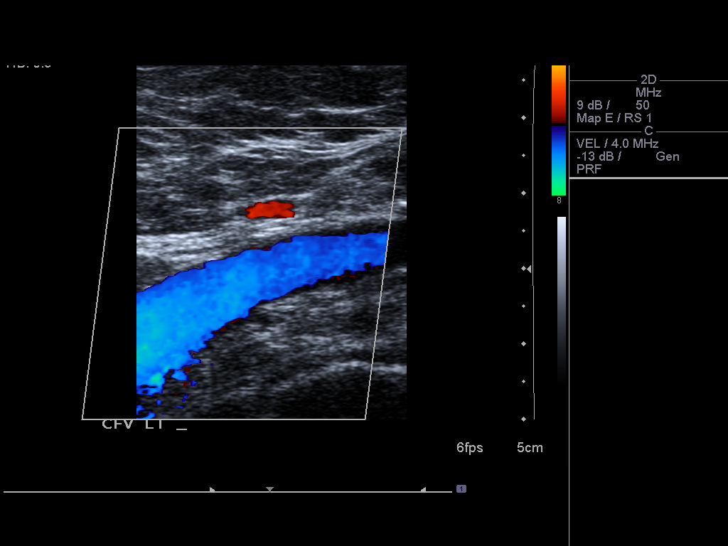
[im 5/26]
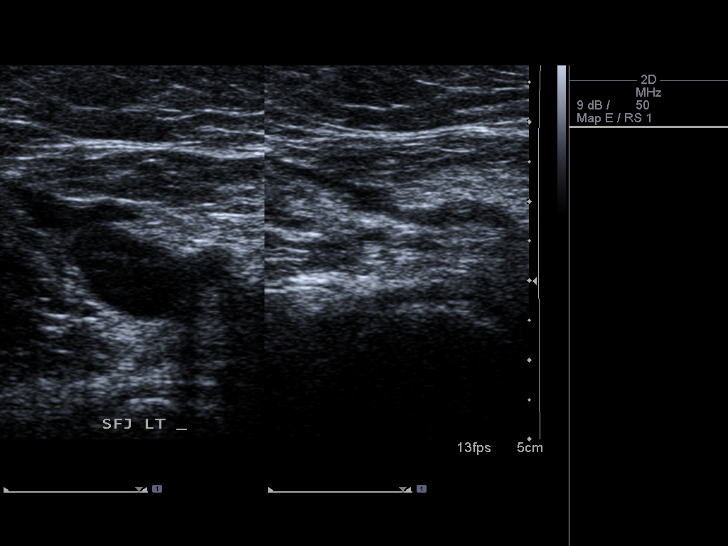
[im 7/26]
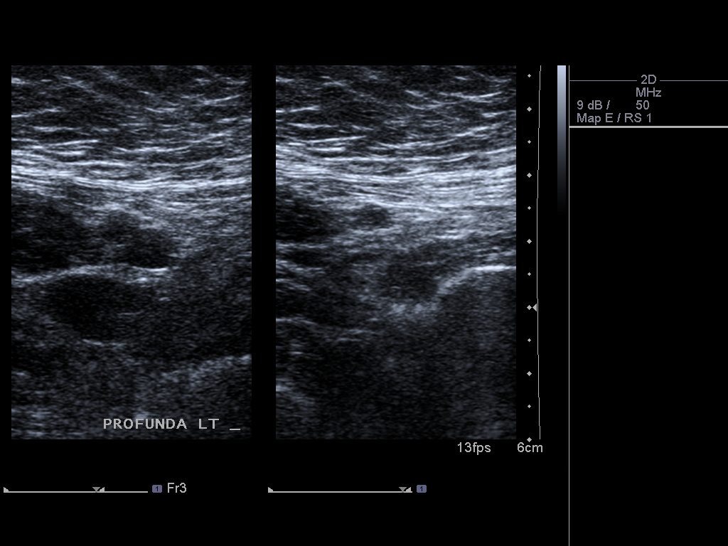
[im 9/26]
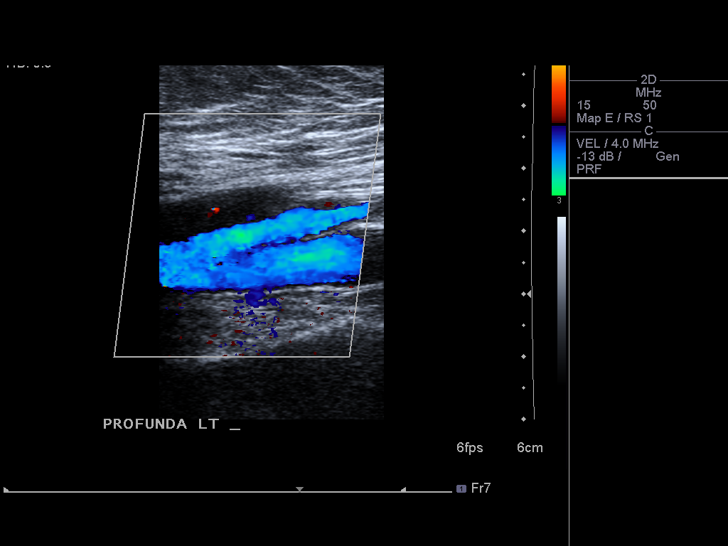
[im 11/26]
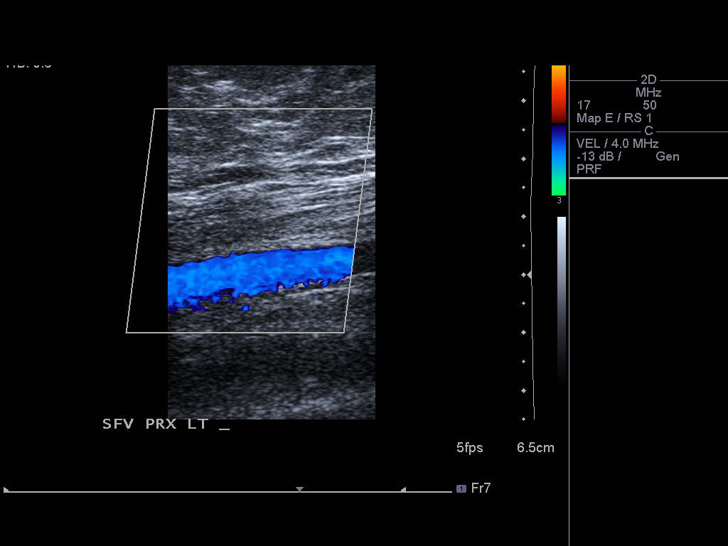
[im 14/26]
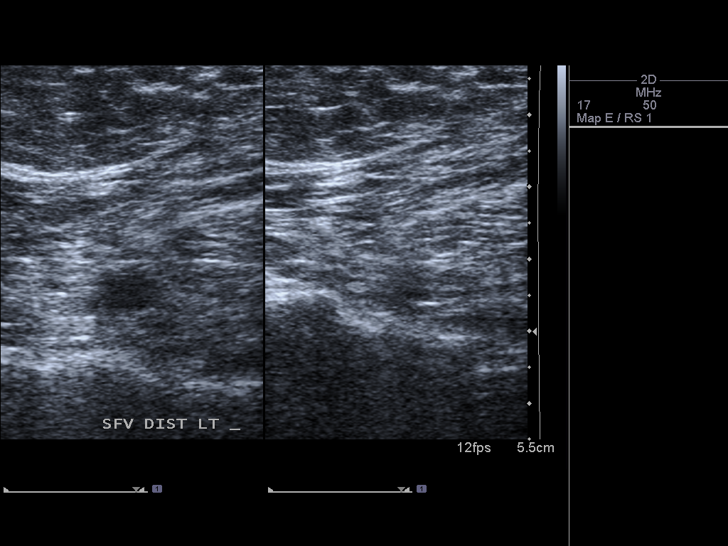
[im 15/26]
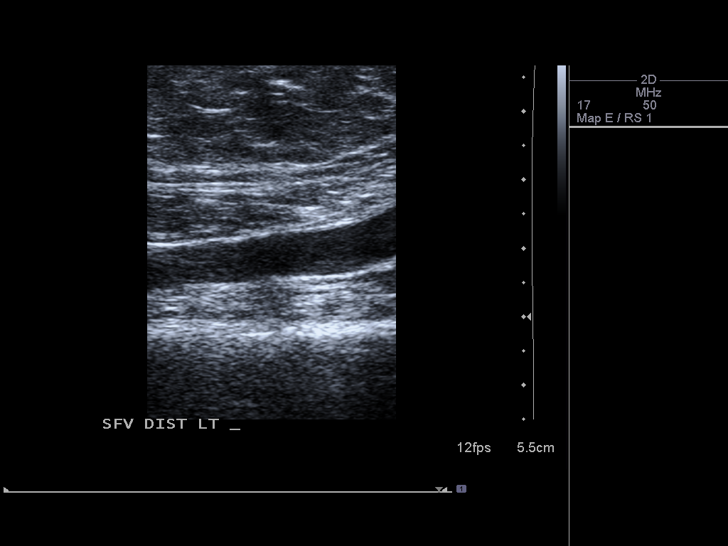
[im 17/26]
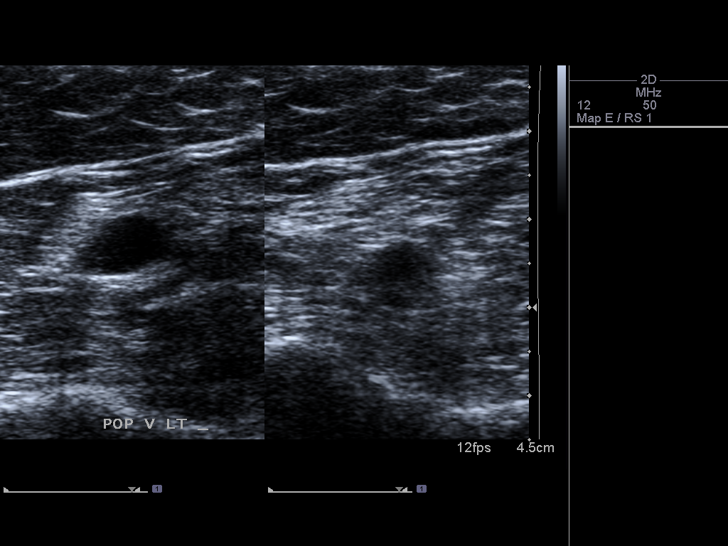
[im 19/26]
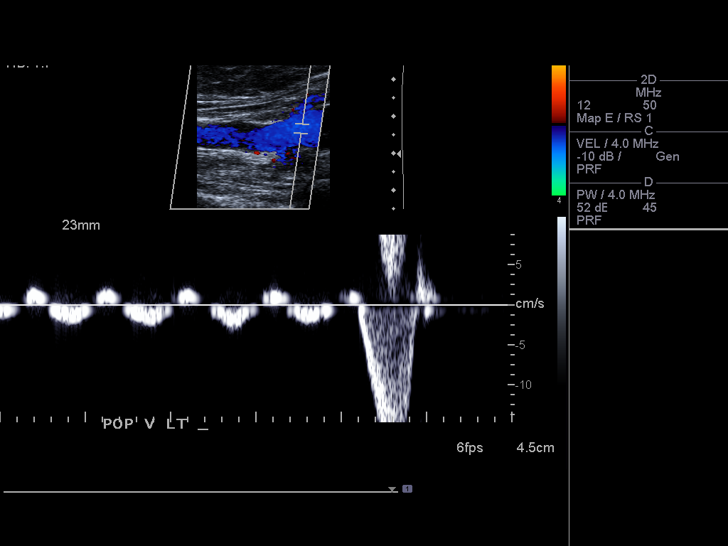
[im 21/26]
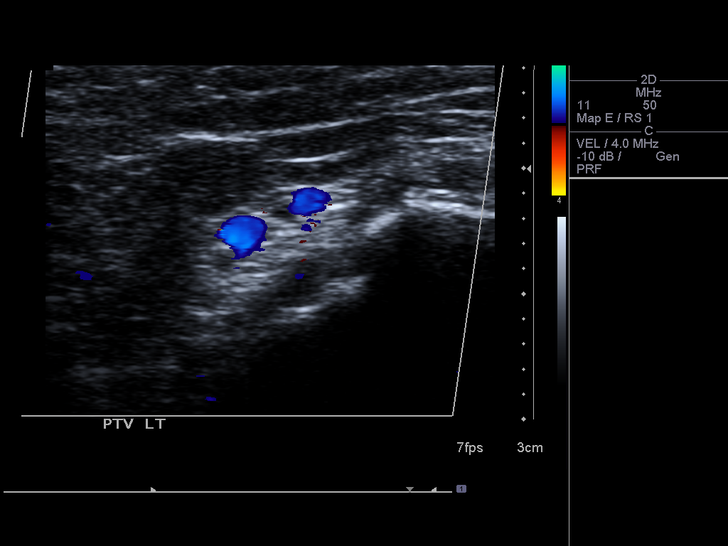
[im 23/26]
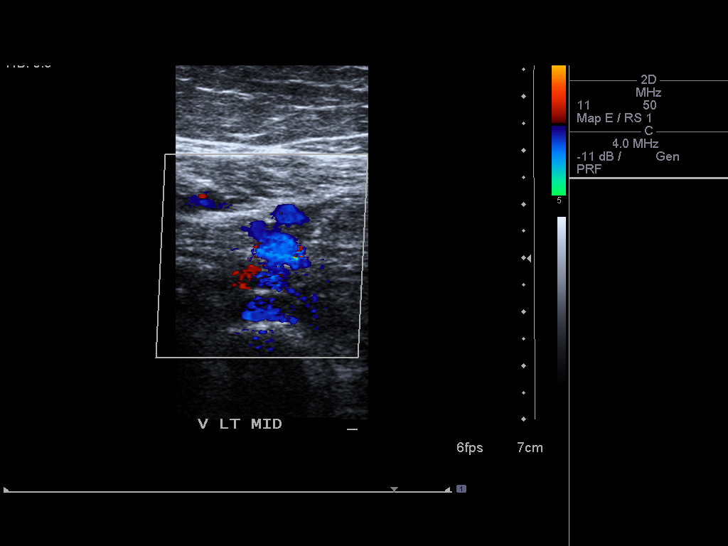
[im 26/26]
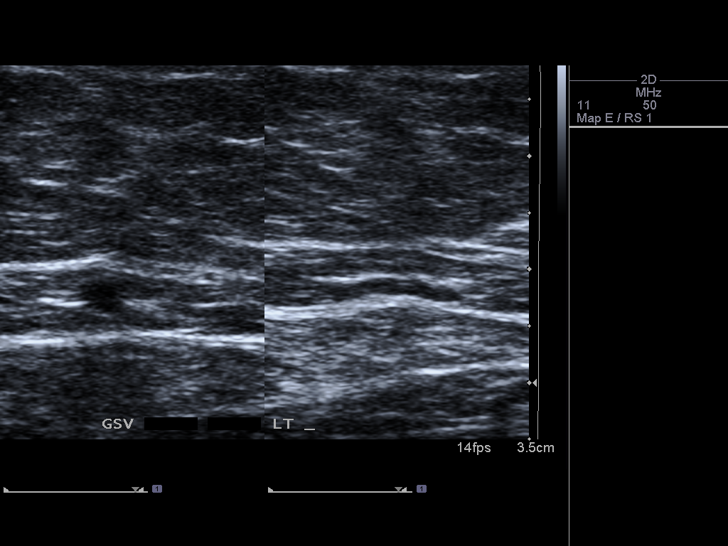

[13 of 24 positions shown; findings below may reference images not displayed]

FINDINGS: Common Femoral Vein: No evidence of thrombus. Normal
compressibility, respiratory phasicity and response to augmentation.

Saphenofemoral Junction: No evidence of thrombus. Normal
compressibility and flow on color Doppler imaging.

Profunda Femoral Vein: No evidence of thrombus. Normal
compressibility and flow on color Doppler imaging.

Femoral Vein: No evidence of thrombus. Normal compressibility,
respiratory phasicity and response to augmentation.

Popliteal Vein: No evidence of thrombus. Normal compressibility,
respiratory phasicity and response to augmentation.

Calf Veins: No evidence of thrombus. Normal compressibility and flow
on color Doppler imaging.

Superficial Great Saphenous Vein: No evidence of thrombus. Normal
compressibility and flow on color Doppler imaging.

Venous Reflux:  None.

Other Findings:  None.
IMPRESSION: No evidence of deep venous thrombosis.

## 2016-06-06 IMAGING — CT CT HEAD W/O CM
2 series · 15 of 30 positions shown, 19 images · non-contrast
Comparison: None.

CLINICAL DATA: Facial droop and dysarthria ; right-sided weakness

EXAM:
CT HEAD WITHOUT CONTRAST
TECHNIQUE: Contiguous axial images were obtained from the base of the skull
through the vertex without intravenous contrast.

[Series 201: head w/o, idose (1) · axial · non-contrast · 0.49mm/px · z∈[+92,+222]mm · 13 of 32 slices shown, 17 images]
[im 3/32  brain]
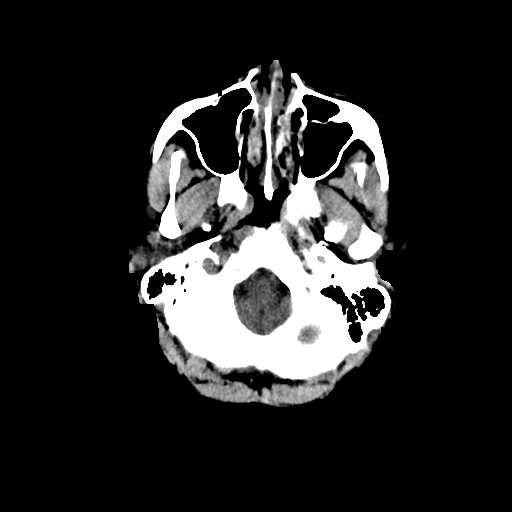
[im 3/32  bone]
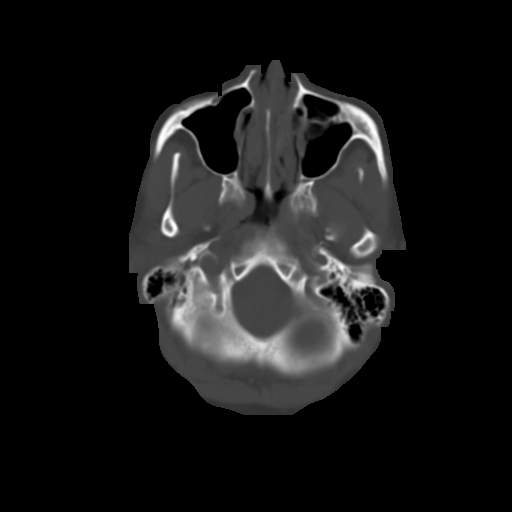
[im 5/32  brain]
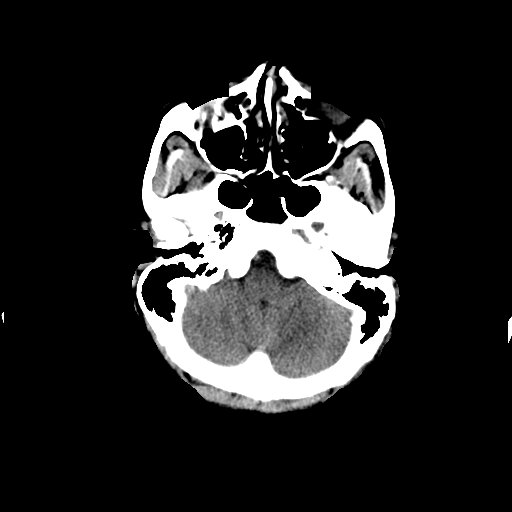
[im 7/32  brain]
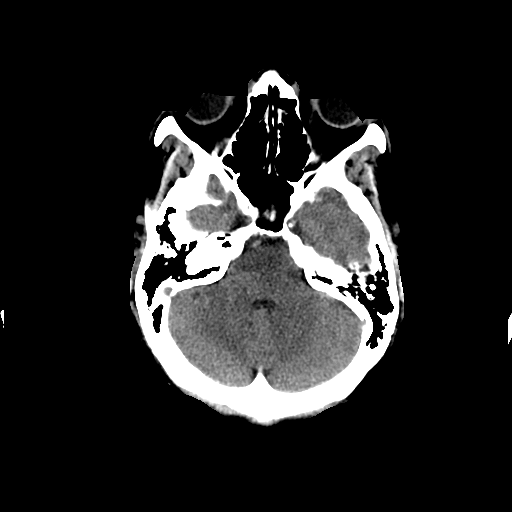
[im 9/32  brain]
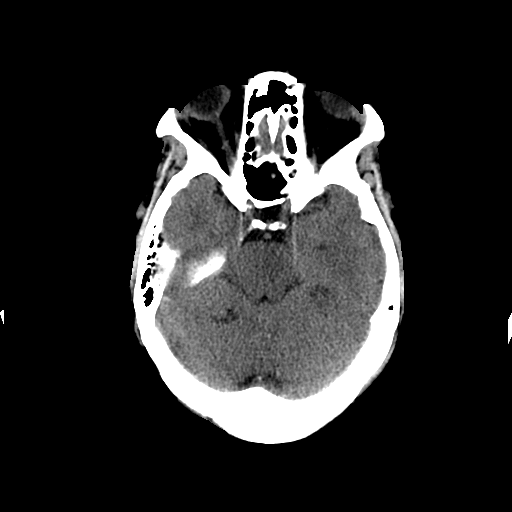
[im 12/32  brain]
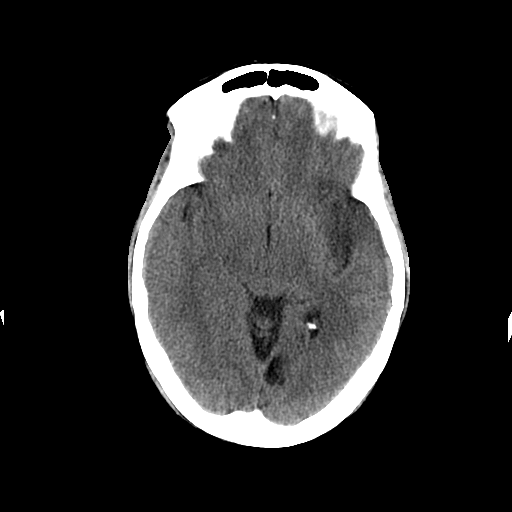
[im 12/32  bone]
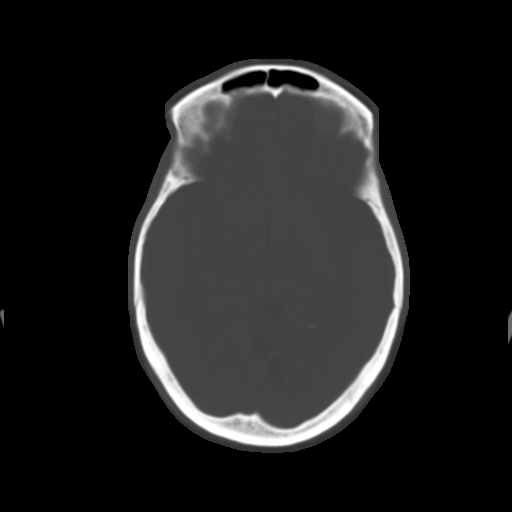
[im 14/32  brain]
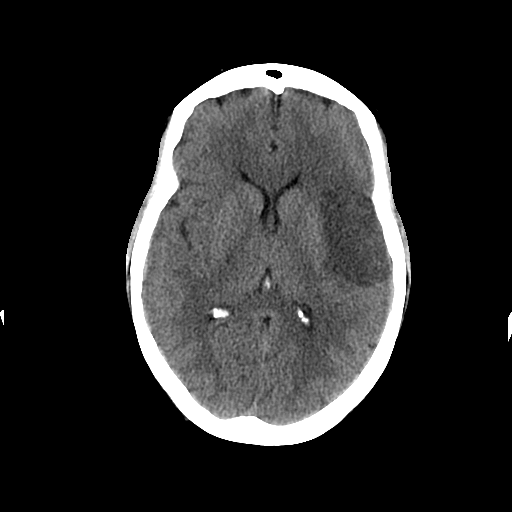
[im 16/32  brain]
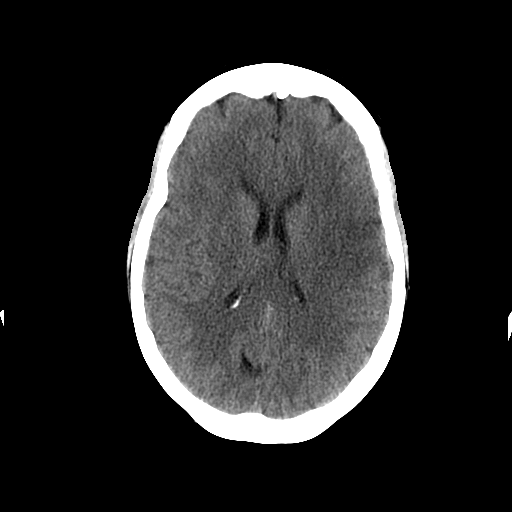
[im 18/32  brain]
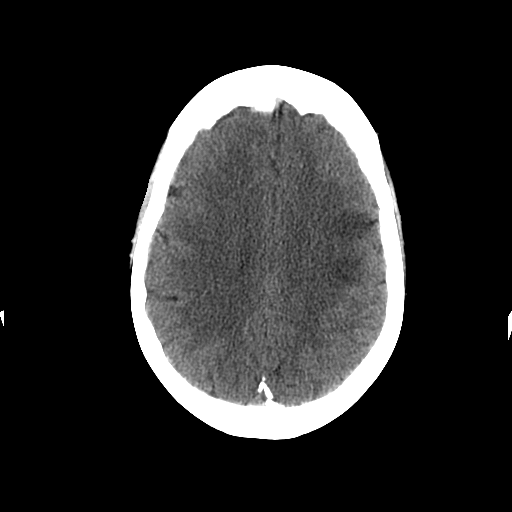
[im 20/32  brain]
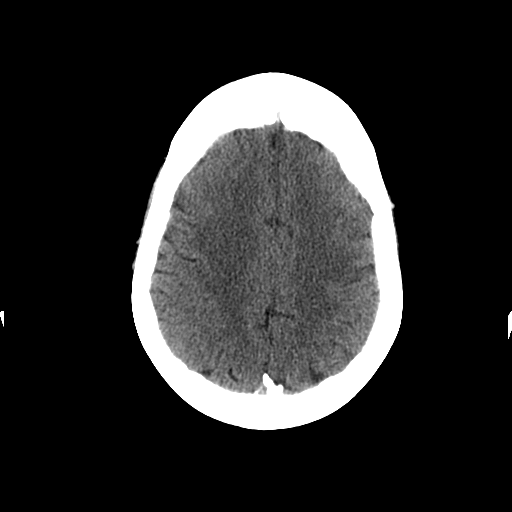
[im 20/32  bone]
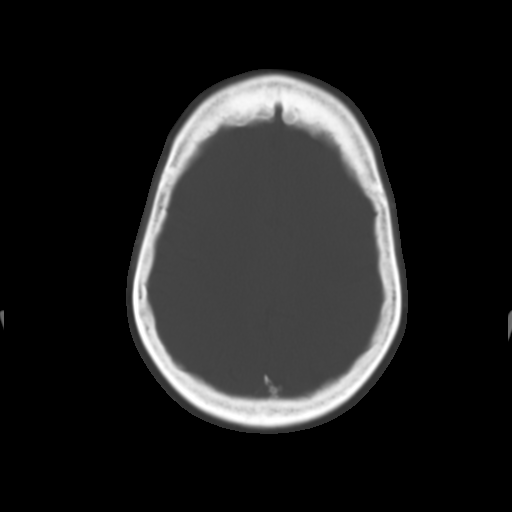
[im 23/32  brain]
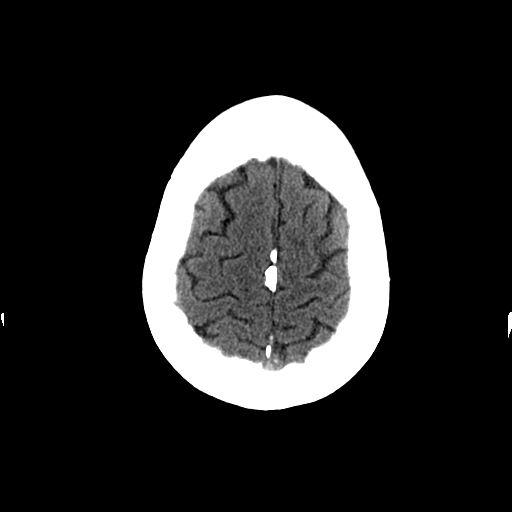
[im 25/32  brain]
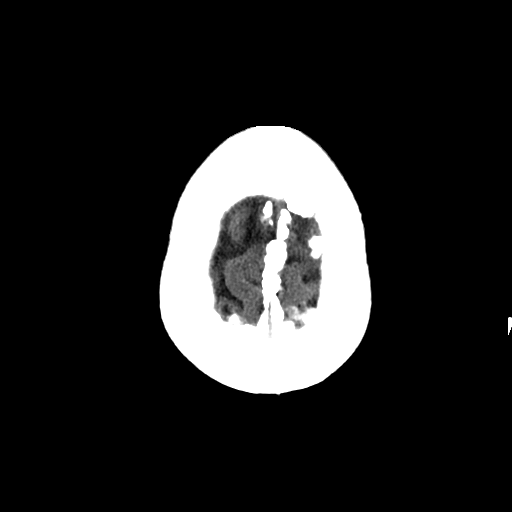
[im 27/32  brain]
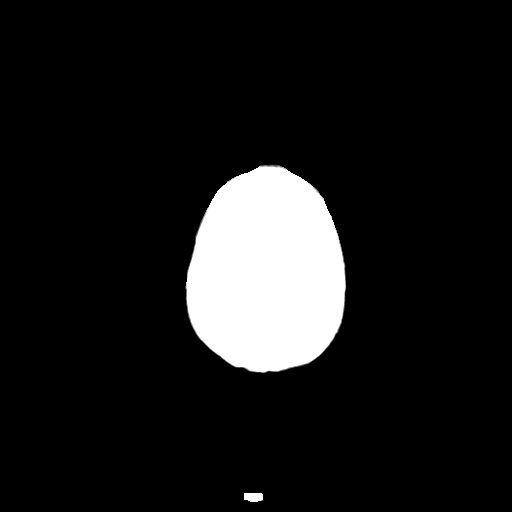
[im 29/32  brain]
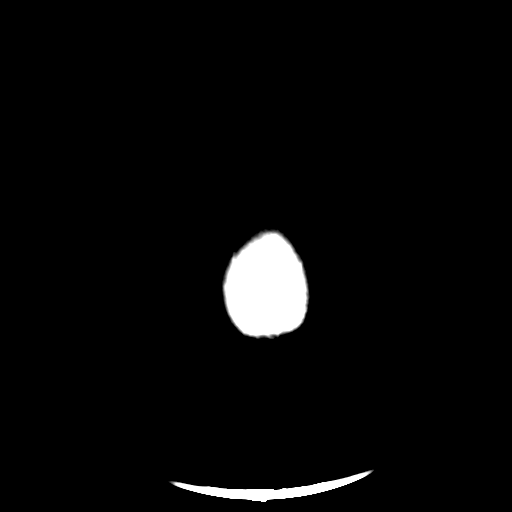
[im 29/32  bone]
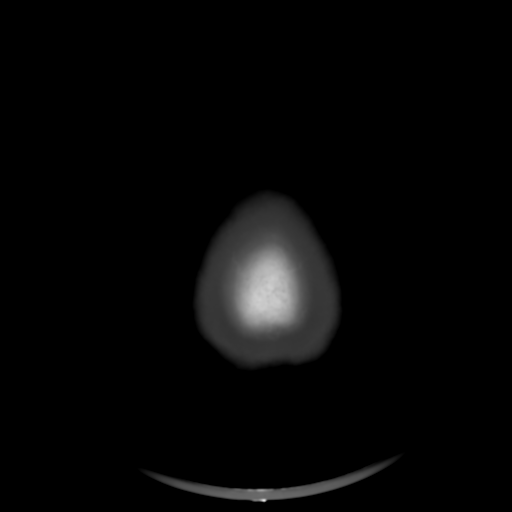

[Series 202: head w/o bone, idose (1) · axial · non-contrast · 0.49mm/px · z∈[+92,+112]mm · 2 of 32 slices shown]
[im 3/32  bone]
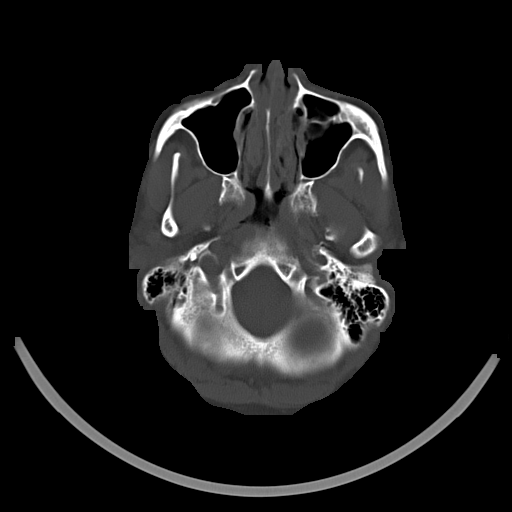
[im 7/32  bone]
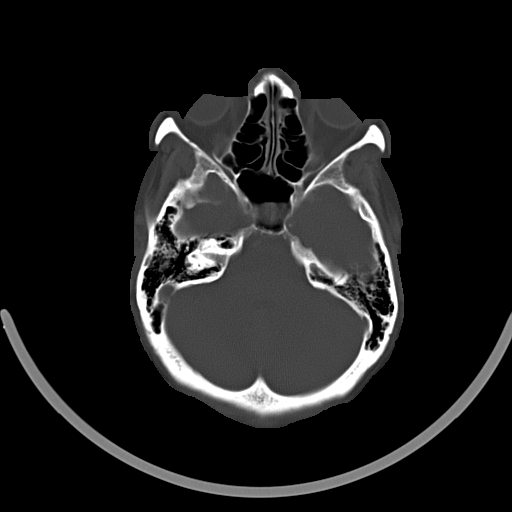

[15 of 30 positions shown; findings below may reference images not displayed]

FINDINGS: The ventricles are normal in size and configuration. There is no
demonstrable mass, hemorrhage, extra-axial fluid collection, or
midline shift. There is decreased attenuation in the superior left
temporal lobe with involvement of the left extreme capsule and
insular cortex as well consistent with an acute infarct with focal
cytotoxic edema in this area. This recent appearing infarct extends
more superiorly to involve a portion of the posterior left frontal
lobe.

Bony calvarium appears intact. The mastoid air cells are clear.
There is nasal turbinate edema bilaterally. There is also decreased
attenuation in a portion of the medial left occipital lobe which
appears more chronic and may represent an older infarct. Elsewhere
gray-white compartments appear unremarkable.
IMPRESSION: Acute infarct involving portions of the superior left temporal lobe
as well as portions of the posterior left frontal lobe. There is
also involvement of the left extreme capsule and insular cortex.
There is cytotoxic edema in these areas.

There is decreased attenuation in the medial left occipital lobe
with sparing of the post rib medial aspect of the left occipital
lobe. Suspect older infarct in this area. There is no acute
hemorrhage. There is no mass or midline shift. There is diffuse
nasal turbinate edema bilaterally.

## 2016-06-06 IMAGING — CR DG CHEST 1V PORT
1 series · 1 of 1 positions shown · non-contrast
Comparison: Portable exam 7688 hr compared to 07/03/2011

CLINICAL DATA: LEFT side stroke, weakness, slurred speech, past
history MI, cervical cancer, hypertension

EXAM:
PORTABLE CHEST - 1 VIEW

[portable]
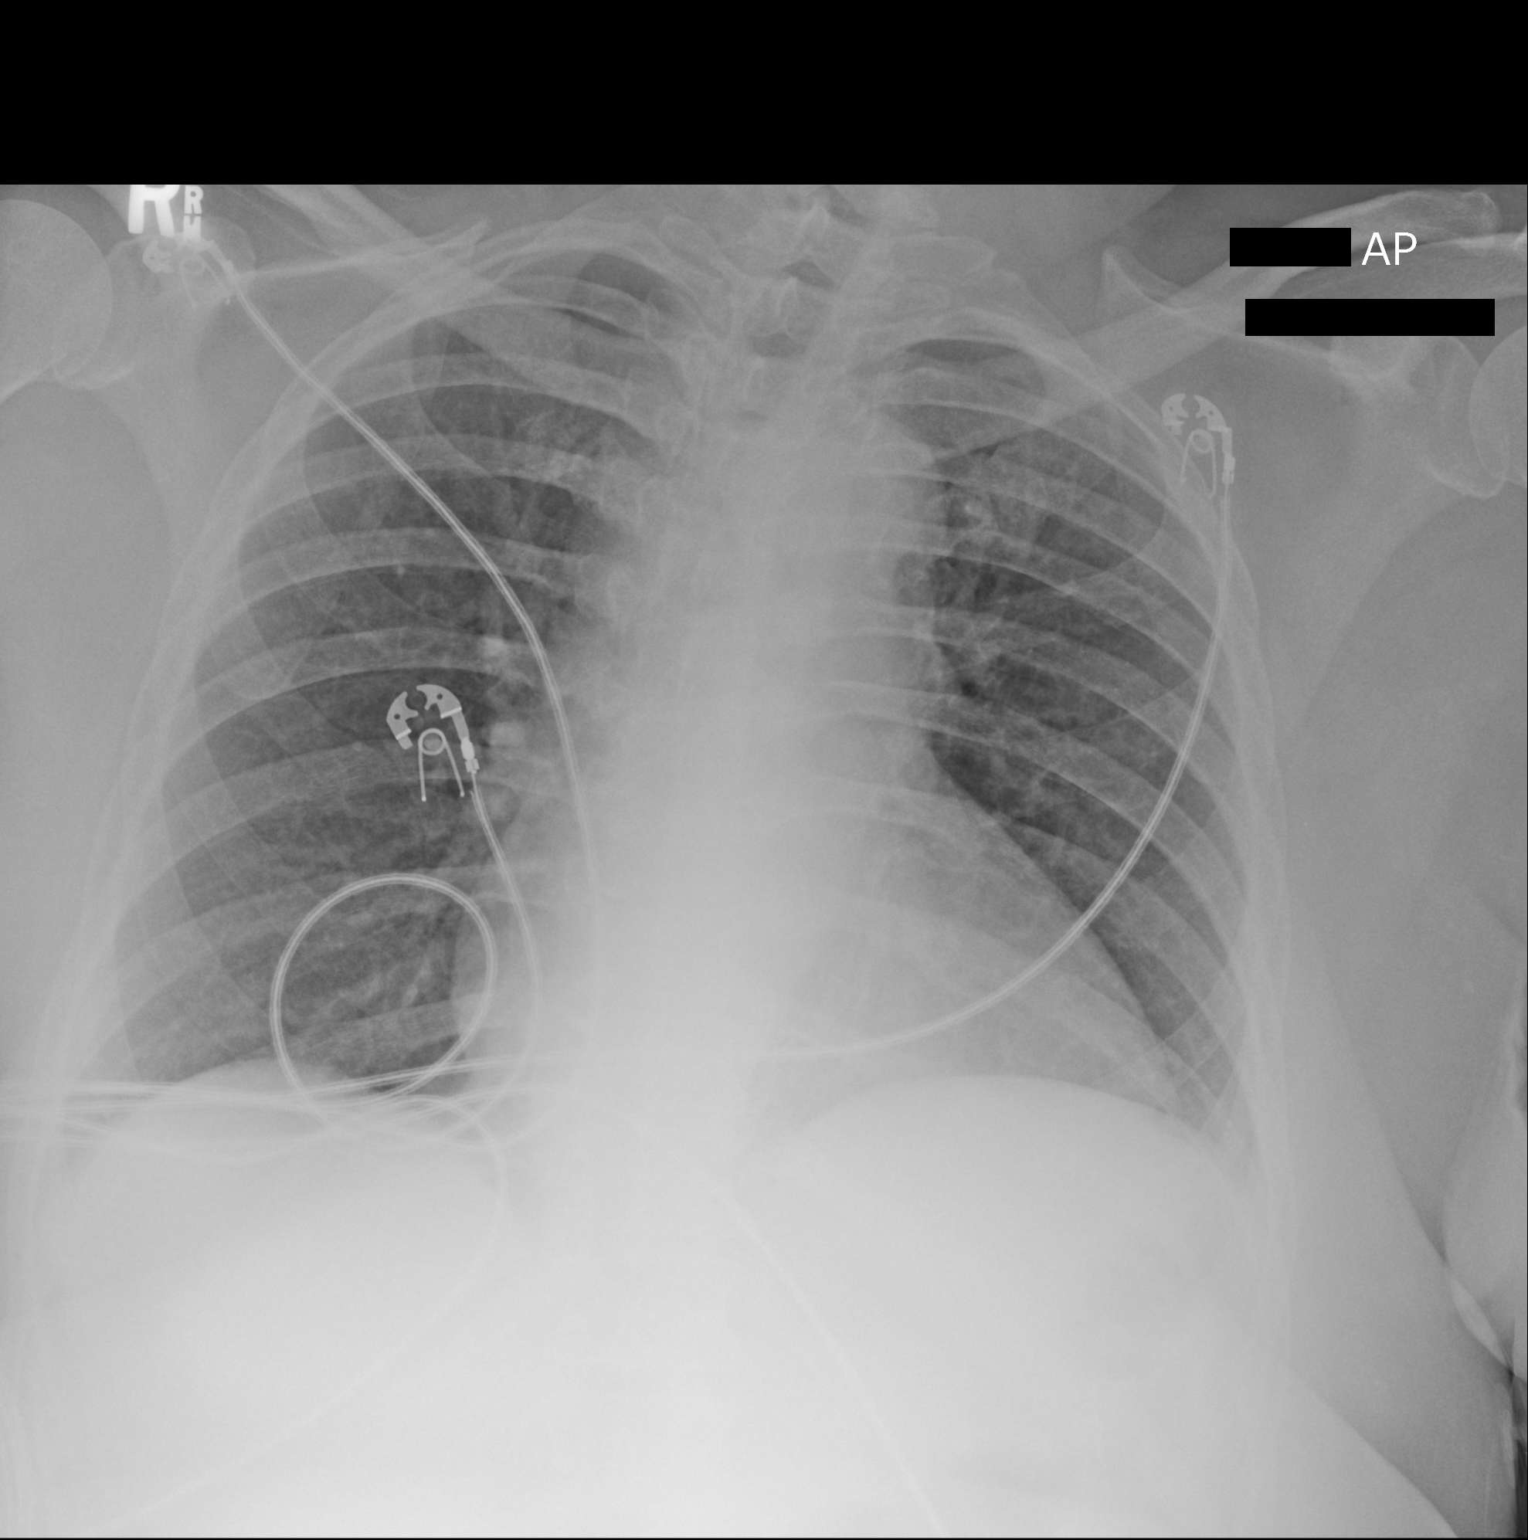

[1 of 1 positions shown; findings below may reference images not displayed]

FINDINGS: Upper normal heart size.

Normal mediastinal contours and pulmonary vascularity.

Slight rotation to the LEFT.

Lungs grossly clear.

No pleural effusion, pneumothorax or acute osseous findings.
IMPRESSION: No acute abnormalities.

## 2016-07-01 ENCOUNTER — Other Ambulatory Visit (INDEPENDENT_AMBULATORY_CARE_PROVIDER_SITE_OTHER): Payer: Medicare PPO | Admitting: Urology

## 2016-08-17 IMAGING — CR DG ABD PORTABLE 1V
1 series · 1 of 1 positions shown · non-contrast
Comparison: None.

CLINICAL DATA: Constipation.

EXAM:
PORTABLE ABDOMEN - 1 VIEW

[AP]
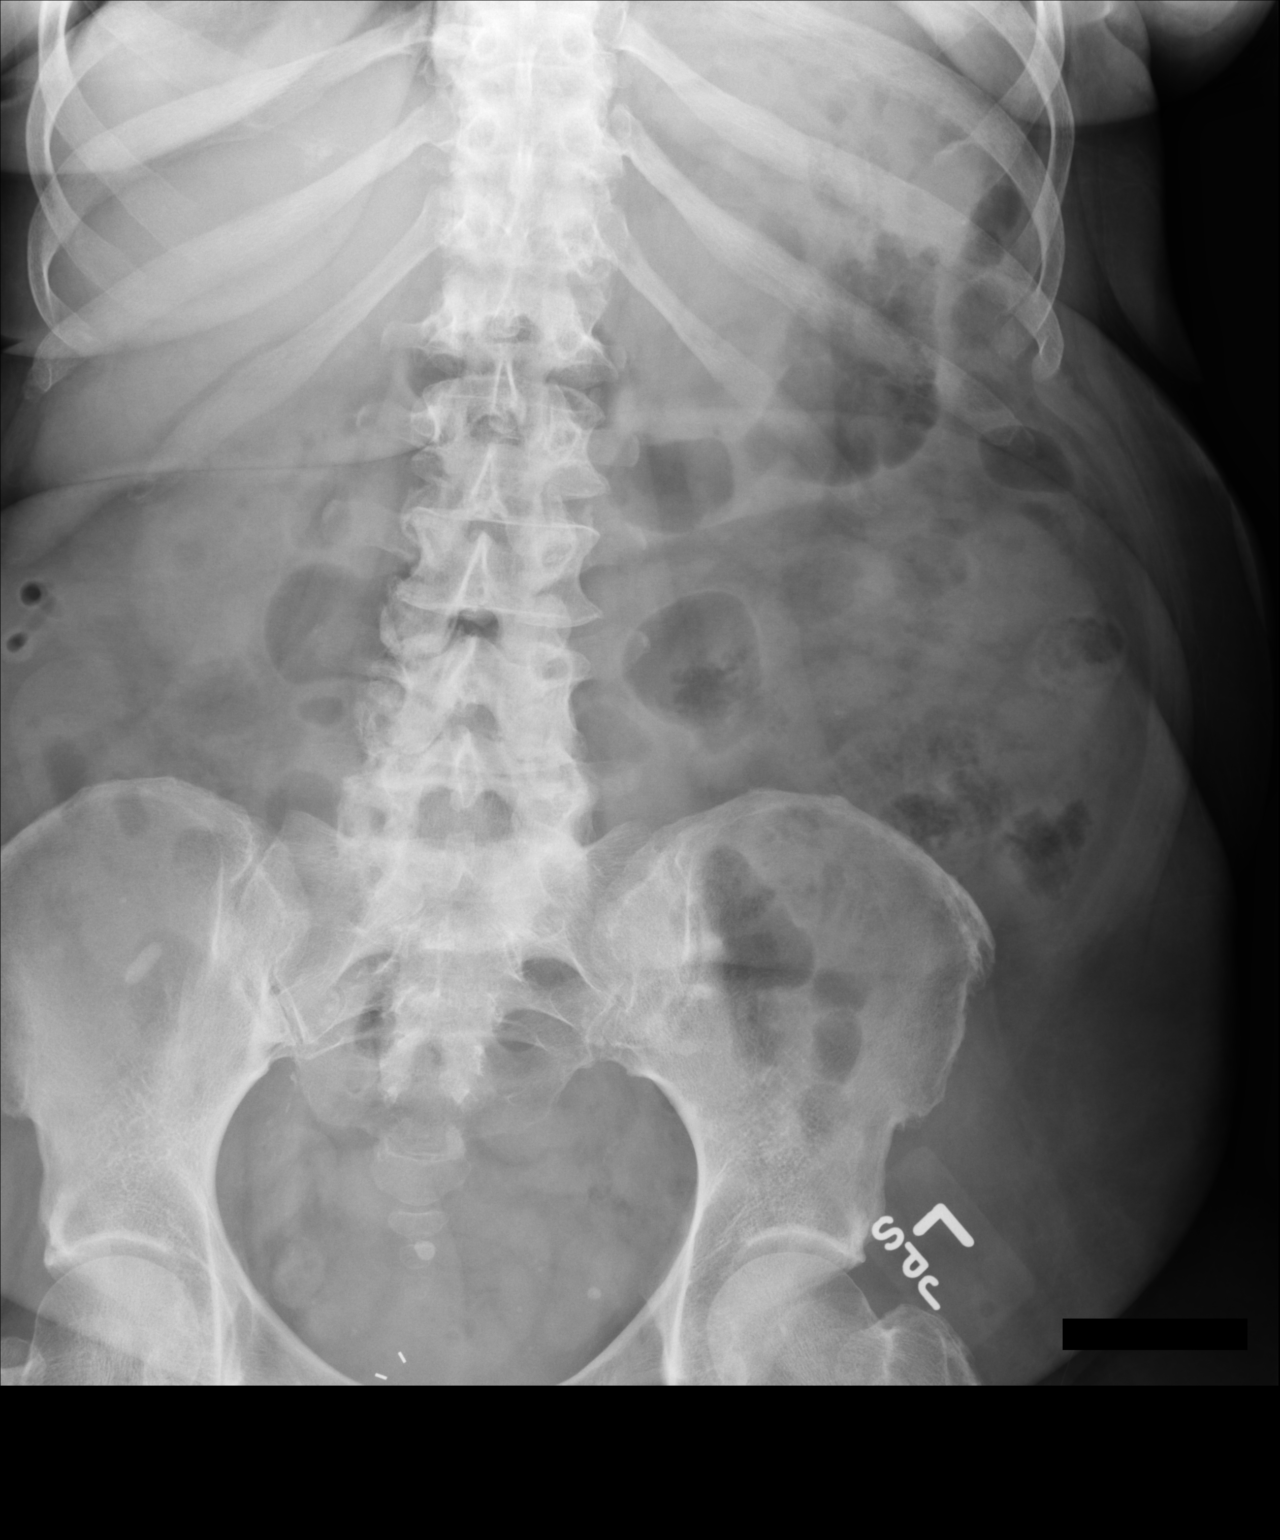

[1 of 1 positions shown; findings below may reference images not displayed]

FINDINGS: The bowel gas pattern is normal. Phleboliths are noted in the
pelvis. No significant stool burden is noted. Degenerative changes
are noted in the lower lumbar spine.
IMPRESSION: No significant stool burden is seen. There is no evidence of bowel
obstruction or ileus.

## 2016-08-17 IMAGING — CR DG CHEST 1V PORT
1 series · 1 of 1 positions shown · non-contrast
Comparison: 03/05/2014

CLINICAL DATA: Fever.

EXAM:
PORTABLE CHEST - 1 VIEW

[AP]
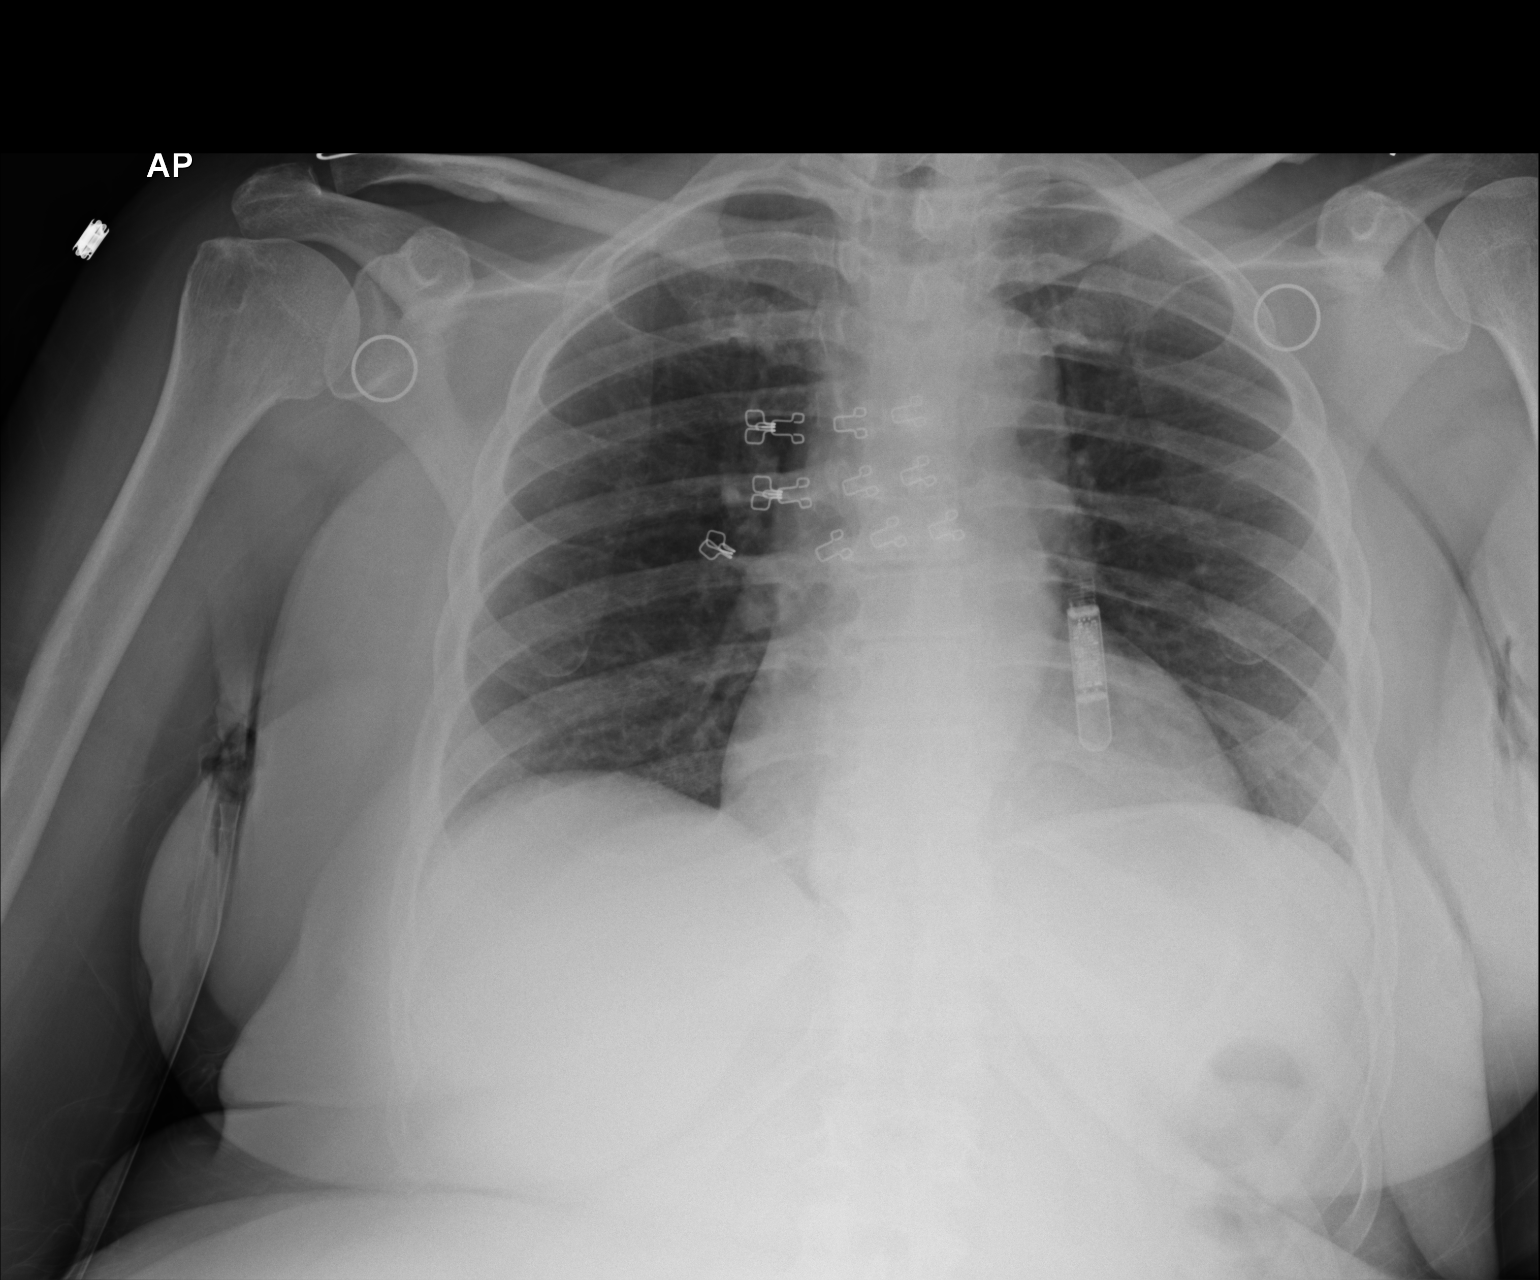

[1 of 1 positions shown; findings below may reference images not displayed]

FINDINGS: There is no evidence of pulmonary edema, consolidation,
pneumothorax, nodule or pleural fluid. The heart size is normal.
There has been interval placement of a cardiac loop recorder device
in the left chest wall. Stable mild tortuosity of the thoracic
aortic
IMPRESSION: No active disease.  Interval placement of loop recorder device.

## 2016-09-04 ENCOUNTER — Telehealth (INDEPENDENT_AMBULATORY_CARE_PROVIDER_SITE_OTHER): Payer: Self-pay | Admitting: Urology

## 2016-09-04 NOTE — Telephone Encounter (Signed)
Called and left a message on daughter's phone to reschedule 3 month bladder cancer surveillance cystoscopy.      Marland KitchenJearld Lesch  09/04/2016, 14:11

## 2016-11-20 ENCOUNTER — Telehealth (INDEPENDENT_AMBULATORY_CARE_PROVIDER_SITE_OTHER): Payer: Self-pay | Admitting: Urology

## 2016-11-20 NOTE — Telephone Encounter (Signed)
I have left messages on 08/03/16, 09/04/16 and 10/09/16 for patient to reschedule her 3 mth cystoscopy for bladder cancer surveillance with no response.     Marland KitchenJearld Lesch  11/20/2016, 08:38

## 2016-12-11 ENCOUNTER — Encounter (INDEPENDENT_AMBULATORY_CARE_PROVIDER_SITE_OTHER): Payer: Self-pay | Admitting: Urology

## 2017-01-06 ENCOUNTER — Encounter (INDEPENDENT_AMBULATORY_CARE_PROVIDER_SITE_OTHER): Payer: Self-pay | Admitting: Urology

## 2017-01-06 ENCOUNTER — Other Ambulatory Visit (HOSPITAL_COMMUNITY): Payer: Self-pay | Admitting: Urology

## 2017-01-06 ENCOUNTER — Other Ambulatory Visit (HOSPITAL_BASED_OUTPATIENT_CLINIC_OR_DEPARTMENT_OTHER): Payer: Medicare Other | Attending: Urology

## 2017-01-06 ENCOUNTER — Ambulatory Visit (INDEPENDENT_AMBULATORY_CARE_PROVIDER_SITE_OTHER): Payer: Medicare Other | Admitting: Urology

## 2017-01-06 VITALS — BP 175/99 | HR 77 | Temp 98.0°F | Ht 63.0 in | Wt 190.0 lb

## 2017-01-06 DIAGNOSIS — Z126 Encounter for screening for malignant neoplasm of bladder: Secondary | ICD-10-CM

## 2017-01-06 DIAGNOSIS — C679 Malignant neoplasm of bladder, unspecified: Secondary | ICD-10-CM | POA: Insufficient documentation

## 2017-01-06 MED ORDER — SULFAMETHOXAZOLE 800 MG-TRIMETHOPRIM 160 MG TABLET
1.0000 | ORAL_TABLET | Freq: Once | ORAL | 0 refills | Status: AC
Start: 2017-01-06 — End: 2017-01-06

## 2017-01-06 MED ORDER — LIDOCAINE 2 % MUCOSAL JELLY IN APPLICATOR
5.0000 mL | Freq: Once | 0 refills | Status: AC
Start: 2017-01-06 — End: 2017-01-06

## 2017-01-06 NOTE — Progress Notes (Signed)
Patient presents for follow-up.  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  She has not had any follow-up in till this time.  She denies any gross hematuria or bladder pain.  She has not received any intravesical therapies.  She underwent surveillance cystoscopy today, which did not show any obvious signs of recurrence.  Urine is being sent for cytology.  We will plan on seeing her back in 6 months for ongoing surveillance of her non muscle invasive bladder cancer, or earlier if any hematuria or other urologic issues arise.  Over 50% of a 25 min visit was spent counseling and coordinating her care.

## 2017-01-06 NOTE — Nursing Note (Signed)
01/06/17 1500 01/06/17 1525   Medication Powell   Medication Name Bactrim DS Lidocaine Jelly   Other Medication --  2%   Medication Dose 800/160 45ml   Route of Administration PO --    [employer] # [id number] [phone]   LOT # [id number]   Expiration date 03/27/17 06/26/18   Manufacturer [place] Yes Yes   Patient Supplied No No   Comments: Cysto Cysto

## 2017-01-06 NOTE — Nursing Note (Signed)
01/06/17 1500   Urine   Glucose Negative   Bilirubin Negative   Ketones Negative   Urine Specific Gravity 1.030   Blood (urine) Negative   pH 5.5   Protein (!) 2+ (100mg /dL)   Urobilinogen 0.2mg /dL (Normal)   Nitrite Negative   Leukocytes Negative

## 2017-01-06 NOTE — Procedures (Signed)
A 16 French flexible cystoscope was inserted per urethra into the bladder.  Previous bladder tumor resection sites were noted.  There is some mild increased erythematous lesions noted throughout the bladder, but no obvious recurrence of her bladder tumors was appreciated.  Both ureteral orifices were identified and were seen to efflux clear urine.

## 2017-01-11 LAB — HISTORICAL URINARY CYTOLOGY

## 2017-01-12 ENCOUNTER — Encounter (INDEPENDENT_AMBULATORY_CARE_PROVIDER_SITE_OTHER): Payer: Self-pay | Admitting: Urology

## 2017-01-12 NOTE — Nursing Note (Signed)
Stsaff message from Dr. Karle Barr. Dimas Chyle, [place]  01/12/2017, 12:53     RE: Cytology Results  Received: Today     Marthe Patch, MD  Dimas Chyle, [place]                Continue routine follow up of bladder cancer         Previous Messages      ----- Message -----    From: Dimas Chyle, [place]    Sent: 01/12/2017 11:49 AM     To: Marthe Patch, MD   Subject: Cytology Results                   Please review Cytology results and advise of POC.   [place], [place] 01/12/2017, 11:49   Final Cytologic Diagnosis   A. Catheterized Urine Cytology:   ATYPICAL      Few atypical cell clusters.      Comment:   Squamous cells.   Umbrella cells.   Cellular degeneration.               Electronically Signed By: Jory Ee Cleda Mccreedy, MD   jas/01/11/2017
